# Patient Record
Sex: Female | Born: 1945 | Race: White | Hispanic: No | Marital: Married | Smoking: Never smoker
Health system: Southern US, Community
[De-identification: ages and names within clinical notes are randomized; demographics above are authoritative.]

## PROBLEM LIST (undated history)

## (undated) DIAGNOSIS — E785 Hyperlipidemia, unspecified: Secondary | ICD-10-CM

## (undated) DIAGNOSIS — M1990 Unspecified osteoarthritis, unspecified site: Secondary | ICD-10-CM

## (undated) DIAGNOSIS — K219 Gastro-esophageal reflux disease without esophagitis: Secondary | ICD-10-CM

## (undated) DIAGNOSIS — E7800 Pure hypercholesterolemia, unspecified: Secondary | ICD-10-CM

## (undated) DIAGNOSIS — I10 Essential (primary) hypertension: Secondary | ICD-10-CM

## (undated) DIAGNOSIS — I639 Cerebral infarction, unspecified: Secondary | ICD-10-CM

## (undated) HISTORY — DX: Hyperlipidemia, unspecified: E78.5

## (undated) HISTORY — DX: Essential (primary) hypertension: I10

---

## 1982-09-20 HISTORY — PX: TUBAL LIGATION: SHX77

## 2005-12-13 ENCOUNTER — Ambulatory Visit (HOSPITAL_COMMUNITY): Admission: RE | Admit: 2005-12-13 | Discharge: 2005-12-13 | Payer: Self-pay | Admitting: Family Medicine

## 2007-07-04 ENCOUNTER — Ambulatory Visit (HOSPITAL_COMMUNITY): Admission: RE | Admit: 2007-07-04 | Discharge: 2007-07-04 | Payer: Self-pay | Admitting: Family Medicine

## 2007-07-07 ENCOUNTER — Ambulatory Visit (HOSPITAL_COMMUNITY): Admission: RE | Admit: 2007-07-07 | Discharge: 2007-07-07 | Payer: Self-pay | Admitting: Family Medicine

## 2007-07-21 ENCOUNTER — Ambulatory Visit: Payer: Self-pay | Admitting: Vascular Surgery

## 2008-04-09 ENCOUNTER — Ambulatory Visit (HOSPITAL_COMMUNITY): Admission: RE | Admit: 2008-04-09 | Discharge: 2008-04-09 | Payer: Self-pay | Admitting: Family Medicine

## 2010-10-11 ENCOUNTER — Encounter: Payer: Self-pay | Admitting: Family Medicine

## 2010-10-21 HISTORY — PX: EYE SURGERY: SHX253

## 2010-11-11 ENCOUNTER — Other Ambulatory Visit: Payer: Self-pay | Admitting: Ophthalmology

## 2010-11-11 ENCOUNTER — Encounter (HOSPITAL_COMMUNITY): Payer: Medicare Other

## 2010-11-11 DIAGNOSIS — Z01810 Encounter for preprocedural cardiovascular examination: Secondary | ICD-10-CM | POA: Insufficient documentation

## 2010-11-11 DIAGNOSIS — Z01812 Encounter for preprocedural laboratory examination: Secondary | ICD-10-CM | POA: Insufficient documentation

## 2010-11-11 LAB — BASIC METABOLIC PANEL
CO2: 27 mEq/L (ref 19–32)
Chloride: 103 mEq/L (ref 96–112)
Creatinine, Ser: 0.73 mg/dL (ref 0.4–1.2)
GFR calc Af Amer: >60 mL/min (ref >60)
Glucose, Bld: 176 mg/dL — ABNORMAL HIGH (ref 70–99)

## 2010-11-17 ENCOUNTER — Ambulatory Visit (HOSPITAL_COMMUNITY)
Admission: RE | Admit: 2010-11-17 | Discharge: 2010-11-17 | Disposition: A | Discharge status: Home / Self Care | Payer: Medicare Other | Source: Ambulatory Visit | Attending: Ophthalmology | Admitting: Ophthalmology

## 2010-11-17 DIAGNOSIS — Z01812 Encounter for preprocedural laboratory examination: Secondary | ICD-10-CM | POA: Insufficient documentation

## 2010-11-17 DIAGNOSIS — H2510 Age-related nuclear cataract, unspecified eye: Secondary | ICD-10-CM | POA: Insufficient documentation

## 2010-11-17 DIAGNOSIS — Z7982 Long term (current) use of aspirin: Secondary | ICD-10-CM | POA: Insufficient documentation

## 2010-11-17 DIAGNOSIS — E119 Type 2 diabetes mellitus without complications: Secondary | ICD-10-CM | POA: Insufficient documentation

## 2011-02-02 NOTE — Consult Note (Signed)
VASCULAR SURGERY CONSULTATION   Nancy Sutton, Nancy Sutton  DOB:  12-13-1945                                       07/21/2007  AOZHY#:[id number]   Patient presents today for evaluation of bilateral lower extremity  claudication.  She is a very pleasant 65 year old white female who has  been quite healthy.  She was recently diagnosed with diabetes and also  reports what sounds like neuropathic pain and claudication in her lower  extremities.  She underwent outpatient noninvasive vascular laboratory  studies of her lower extremity arterial at [facility] on  October 17.  I am seeing her for discussion of this on further review in  consultation from Dr. Patrica Duel.   She has several components to her lower extremity discomfort.  She  reports that she has a tingling sensation and pain in the pretibial area  on the left.  She denies any of this on the right leg.  She also does  report straightforward bilateral calf claudication with extensive  walking.  She reports that this had been present for several months and  repots this has been more prominent on the left versus the right leg.  She denies any tissue loss.  She did have injury to her left great  toenail bed and has had a hematoma onto this for several years.   Her past history is significant for new onset of diabetes, non-insulin-  dependent.  She does have a history of elevated cholesterol.   FAMILY HISTORY:  Significant for premature atherosclerotic disease and  coronary disease in her father.   SOCIAL HISTORY:  She is married with two children.  She works as a  Charity fundraiser.  She does not smoke and has never consumed  alcohol.   REVIEW OF SYSTEMS:  She does report a 50 pound weight loss for unknown  cause and is down to 109 pounds.  CARDIAC:  No history of arrhythmias or  cardiac disease.  PULMONARY:  No history of pneumonia or other pulmonary  disease.  GI:  She does have acid reflux.  GU:   Negative.  NEURO:  Without history of stroke or seizures.  ORTHOPEDIC:  She does have  arthritis.  PSYCHIATRIC:  Negative for depression, urgency.  HEENT:  Some recent change in her eyesight.   DICTATION ENDS HERE.   Larina Earthly, M.D.  Electronically Signed  TFE/MEDQ  D:  07/21/2007  T:  07/24/2007  Job:  [id number]   cc:   Patrica Duel, M.D.

## 2011-02-02 NOTE — Consult Note (Signed)
VASCULAR SURGERY CONSULTATION   Nancy, BUDREAU  DOB:  Feb 10, 1946                                       07/21/2007  [id number]#:[id number]   This is a continuation of a previously dictated report.   ALLERGIES:  TETANUS, AMPICILLIN.   MEDICATIONS:  Prilosec, Glyburide, Metformin, simvastatin, and  Neurontin.   PHYSICAL EXAMINATION:  Patient is a well-developed and well-nourished  white female appearing her stated age of 62.  Blood pressure is 128/65,  pulse 89, respirations 16, O2 saturation is 100% on room air.  Her  carotid arteries are without bruits bilaterally.  She is grossly intact  neurologically.  Her radial pulses are 2+.  She has 2+ femoral pulses  and 1-2+ popliteal pulses bilaterally.  Did not feel right pedal pulse.  She has a 1+ posterior tibial pulse on the left and no dorsalis pedis  pulse on the left.  She has no tissue loss.  Her skin is normal with no  evidence of breakdown.   I reviewed her noninvasive vascular laboratory study with her with an  ankle-arm index of approximately 0.7 bilaterally.  I explained to her  that she does have mild-to-moderate arterial insufficiency causing the  mild calf claudication symptoms.  I explained that the tingling in her  left foot would not be related to this, since this is at rest, and she  actually has palpable pulses at rest on the left.  I explained that she  may have progression over time, but this is typically a stable  situation with slow progression.  She understands the risk factors and  control of these,  particularly her diabetes and elevated cholesterol control.  She will  see [place] again on an as-needed basis, should she have any progression.   Larina Earthly, M.D.  Electronically Signed  TFE/MEDQ  D:  07/21/2007  T:  07/24/2007  Job:  [id number]

## 2011-03-21 DIAGNOSIS — I639 Cerebral infarction, unspecified: Secondary | ICD-10-CM

## 2011-03-21 HISTORY — DX: Cerebral infarction, unspecified: I63.9

## 2011-04-30 ENCOUNTER — Inpatient Hospital Stay (HOSPITAL_COMMUNITY)
Admission: EM | Admit: 2011-04-30 | Discharge: 2011-05-01 | DRG: 066 | Disposition: A | Discharge status: Home / Self Care | Payer: Medicare Other | Attending: Family Medicine | Admitting: Family Medicine

## 2011-04-30 ENCOUNTER — Emergency Department (HOSPITAL_COMMUNITY): Payer: Medicare Other

## 2011-04-30 DIAGNOSIS — E785 Hyperlipidemia, unspecified: Secondary | ICD-10-CM | POA: Diagnosis present

## 2011-04-30 DIAGNOSIS — Z7982 Long term (current) use of aspirin: Secondary | ICD-10-CM

## 2011-04-30 DIAGNOSIS — H538 Other visual disturbances: Secondary | ICD-10-CM | POA: Diagnosis present

## 2011-04-30 DIAGNOSIS — I6350 Cerebral infarction due to unspecified occlusion or stenosis of unspecified cerebral artery: Principal | ICD-10-CM | POA: Diagnosis present

## 2011-04-30 DIAGNOSIS — H53469 Homonymous bilateral field defects, unspecified side: Secondary | ICD-10-CM | POA: Diagnosis present

## 2011-04-30 DIAGNOSIS — N289 Disorder of kidney and ureter, unspecified: Secondary | ICD-10-CM | POA: Diagnosis present

## 2011-04-30 DIAGNOSIS — D649 Anemia, unspecified: Secondary | ICD-10-CM | POA: Diagnosis present

## 2011-04-30 DIAGNOSIS — E119 Type 2 diabetes mellitus without complications: Secondary | ICD-10-CM | POA: Diagnosis present

## 2011-04-30 LAB — PROTIME-INR
INR: 0.95 (ref 0.00–1.49)
Prothrombin Time: 12.9 seconds (ref 11.6–15.2)

## 2011-04-30 LAB — COMPREHENSIVE METABOLIC PANEL
AST: 29 U/L (ref 0–37)
Albumin: 4.6 g/dL (ref 3.5–5.2)
Alkaline Phosphatase: 57 U/L (ref 39–117)
Chloride: 99 mEq/L (ref 96–112)
Potassium: 4.2 mEq/L (ref 3.5–5.1)
Total Bilirubin: 0.3 mg/dL (ref 0.3–1.2)
Total Protein: 8.1 g/dL (ref 6.0–8.3)

## 2011-04-30 LAB — URINALYSIS, ROUTINE W REFLEX MICROSCOPIC
Glucose, UA: NEGATIVE mg/dL
Ketones, ur: 15 mg/dL — AB
Nitrite: NEGATIVE
Protein, ur: NEGATIVE mg/dL
pH: 5 (ref 5.0–8.0)

## 2011-04-30 LAB — URINE MICROSCOPIC-ADD ON

## 2011-04-30 LAB — CBC
HCT: 35.8 % — ABNORMAL LOW (ref 36.0–46.0)
Hemoglobin: 12.4 g/dL (ref 12.0–15.0)
RBC: 4.31 MIL/uL (ref 3.87–5.11)
WBC: 8.7 10*3/uL (ref 4.0–10.5)

## 2011-04-30 LAB — TROPONIN I: Troponin I: <0.3 ng/mL (ref <0.30)

## 2011-04-30 LAB — CK TOTAL AND CKMB (NOT AT ARMC): CK, MB: 2.8 ng/mL (ref 0.3–4.0)

## 2011-04-30 LAB — APTT: aPTT: 32 seconds (ref 24–37)

## 2011-05-01 ENCOUNTER — Inpatient Hospital Stay (HOSPITAL_COMMUNITY): Payer: Medicare Other

## 2011-05-01 LAB — CBC
HCT: 32.9 % — ABNORMAL LOW (ref 36.0–46.0)
Hemoglobin: 11.1 g/dL — ABNORMAL LOW (ref 12.0–15.0)
MCH: 28.2 pg (ref 26.0–34.0)
MCV: 83.5 fL (ref 78.0–100.0)
Platelets: 229 10*3/uL (ref 150–400)
RBC: 3.94 MIL/uL (ref 3.87–5.11)
WBC: 6.2 10*3/uL (ref 4.0–10.5)

## 2011-05-01 LAB — BASIC METABOLIC PANEL
BUN: 27 mg/dL — ABNORMAL HIGH (ref 6–23)
CO2: 26 mEq/L (ref 19–32)
Calcium: 9.8 mg/dL (ref 8.4–10.5)
Chloride: 105 mEq/L (ref 96–112)
Creatinine, Ser: 0.7 mg/dL (ref 0.50–1.10)
Glucose, Bld: 139 mg/dL — ABNORMAL HIGH (ref 70–99)

## 2011-05-01 LAB — GLUCOSE, CAPILLARY: Glucose-Capillary: 156 mg/dL — ABNORMAL HIGH (ref 70–99)

## 2011-05-01 LAB — LIPID PANEL
LDL Cholesterol: 95 mg/dL (ref 0–99)
Total CHOL/HDL Ratio: 4.1 RATIO
VLDL: 61 mg/dL — ABNORMAL HIGH (ref 0–40)

## 2011-05-01 MED ORDER — GADOBENATE DIMEGLUMINE 529 MG/ML IV SOLN
12.0000 mL | Freq: Once | INTRAVENOUS | Status: AC
  Administered 2011-05-01: 12 mL via INTRAVENOUS

## 2011-05-01 NOTE — H&P (Signed)
Nancy Sutton, Nancy Sutton NO.:  [id number]  MEDICAL RECORD NO.:  [id number]  LOCATION:  [facility]                         FACILITY:  [facility]  PHYSICIAN:  Della Goo, M.D. DATE OF BIRTH:  July 14, 1946  DATE OF ADMISSION:  04/30/2011 DATE OF DISCHARGE:                             HISTORY & PHYSICAL   DATE OF ADMISSION:  April 30, 2011.  PRIMARY CARE PHYSICIAN:  Corrie Mckusick, MD  CHIEF COMPLAINT:  Decreased vision.  HISTORY OF PRESENT ILLNESS:  This is a 65 year old female who has had complaints of visual changes and dizziness, which started on April 20, 2011.  The patient states she woke up feeling dizzy and had visual changes.  She could not see objects clearly.  She states that day the symptoms began to get better, however, her vision did not get better. She could not focus.  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  The neurologist at [facility] on-call was consulted and the patient was advised to transfer to [facility] for admission and further evaluation. The patient arrived, decided to be driven by her family and arrived at the emergency department at [facility] and was seen in the emergency department.  The patient reported when she had her symptoms initially April 20, 2011; she did not have a headache or chest pain.  She stated that her visual problem, she cannot see the left side of each eye.  She also states that objects appear blurry.  The patient denies having any previous similar symptoms to this.  PAST MEDICAL HISTORY: 1. Significant for type 2 diabetes mellitus. 2. Hyperlipidemia. 3. Gastroesophageal reflux disease.  MEDICATIONS:  Include; glyburide, metformin, omeprazole, aspirin, Januvia, gabapentin,  simvastatin.  ALLERGIES:  AMPICILLIN and BROMDAY OPHTHALMIC DROPS and TETANUS VACCINE.  PAST SURGICAL HISTORY:  History of bilateral tubal ligation.  SOCIAL HISTORY:  The patient is married with children.  She is a nonsmoker, nondrinker.  No history of illicit drug usage.  FAMILY HISTORY:  Positive for a stroke in her father.  Positive for coronary artery disease.  No maternal grandmother.  Positive cancer in her mother who had pancreatic cancer with metastatic disease.  REVIEW OF SYSTEMS:  Pertinents are mentioned above.  PHYSICAL EXAMINATION:  GENERAL:  This is a 65 year old well-nourished, well-developed Caucasian female who is in no acute distress or discomfort at this time. VITAL SIGNS:  Temperature 98.4, blood pressure 130/61, heart rate 101, respirations 19, O2 sat is 100%. HEENT:  Normocephalic, atraumatic.  Pupils equally, round and reactive to light.  Extraocular movements are intact except the patient is having difficulty with medial rotation of both eyes.  With this maneuver, the patient is gazing to her left.  Funduscopic benign.  Nares are patent bilaterally.  Oropharynx is clear.  There is no tongue deviation. NECK:  Supple.  Full range of motion.  No thyromegaly, adenopathy, jugular venous distention. CARDIOVASCULAR:  Regular rate and rhythm.  No murmurs, gallops or rubs appreciated. LUNGS:  Clear to auscultation bilaterally.  No rales, rhonchi or wheezes. ABDOMEN:  Positive bowel sounds, soft, nontender, nondistended.  No hepatosplenomegaly.  No rebound or guarding. EXTREMITIES:  Without cyanosis, clubbing or edema. NEUROLOGIC:  Cranial nerves are intact except the patient has a left homonymous hemianopsia.  She also has a deficit with ocular convergents. There is no facial drooping.  There is no tongue deviation.  The patient has no pronator drift.  There is no hemiplegia or hemiparesis and her grip strength is 5/5 bilaterally.  Cerebellar function is also  intact; however, gait has not been assessed and Romberg has not been performed.  ASSESSMENT:  A 65 year old female being admitted with; 1. Recent right-sided PCA infarction. 2. Type 2 diabetes mellitus. 3. Dyslipidemia/hyperlipidemia. 4. Visual deficit secondary to recent right-sided PCA infarction.  PLAN:  The patient has been admitted for further evaluation of her risk factors for risk ratification and treatment.  The patient has been seen by neurologist, Dr. Pearlean Brownie and has been started on Plavix therapy.  The patient will have a CVA workup and she already had an MRI study as mentioned above in the HPI performed at the [facility] [place].  These results will be referred to Radiology at [facility] as well.  A fasting lipid panel will be checked.  A hemoglobin A1c will also be checked and cardiac enzymes and further imaging studies such as a carotid ultrasound and TEE study will be ordered.  Per Dr. Pearlean Brownie the consultant, if the patient has an uneventful stay during the first 23 hours, consideration will be made for an outpatient TEE study.  Further workup will ensue pending results of the patient's clinical course and her studies and the patient is a full code.     Della Goo, M.D.     HJ/MEDQ  D:  04/30/2011  T:  04/30/2011  Job:  [id number]  cc:   Corrie Mckusick, M.D.  Electronically Signed by Della Goo M.D. on 05/01/2011 09:25:43 PM

## 2011-05-03 LAB — GLUCOSE, CAPILLARY: Glucose-Capillary: 161 mg/dL — ABNORMAL HIGH (ref 70–99)

## 2011-05-03 MED ORDER — GADOBENATE DIMEGLUMINE 529 MG/ML IV SOLN
12.0000 mL | Freq: Once | INTRAVENOUS | Status: AC
  Administered 2011-05-01: 12 mL via INTRAVENOUS

## 2011-05-05 NOTE — Discharge Summary (Signed)
NAMEBRAYLON, LEMMONS NO.:  [id number]  MEDICAL RECORD NO.:  [id number]  LOCATION:  3018                         FACILITY:  [facility]  PHYSICIAN:  Pleas Koch, MD        DATE OF BIRTH:  [birth year]  DATE OF ADMISSION:  04/30/2011 DATE OF DISCHARGE:  05/01/2011                              DISCHARGE SUMMARY   DISCHARGE DIAGNOSES: 1. 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 2. Hyperlipidemia with LDL of 95, triglycerides 304, and total     cholesterol 206. 3. Diabetes mellitus, A1c 0.8. 4. Mild renal insufficiency, which was resolved. 5. Anemia.  DISCHARGE MEDICATIONS: 1. Metanx one tablet b.i.d. 2. Gabapentin 300 mg one tablet p.o. daily. 3. Januvia 100 mg daily. 4. Simvastatin, please note dosage changed from 20 to 40 mg every     evening. 5. Please note aspirin has been discontinued and substituted Plavix. 6. Omeprazole 20 mg one tablet daily. 7. Glyburide/metformin 2.5/500 two tablets b.i.d.  PERTINENT IMAGING STUDIES: 1. CT head showing low attenuation changes consistent with acute     infarct in the distribution of right posterior cerebral artery.  No     evidence of acute intracranial hemorrhage or mass lesion. 2. MRA brain showing;     a.     Right PCA occluded 10 mm beyond origin without reconstituted      flow.  Moderate-to-severe left PCA P2 segment stenosis, appears      elevated atherosclerosis.     b.     Distal vertebral arteries and distal arteries are within      normal limits.     c.     Severe cavernous and supraclinoid ICA atherosclerosis      bilaterally with high-grade right ICA anterior genu stenosis.     d.     Moderate bilateral MCA atherosclerosis with significant      stenosis of major branch occlusion. 3. MRA head showed:     a.     Nondominant right vertebral artery MRA without MRA evidence      of dissection.  Dominant left vertebral artery, mild stenosis at      origin, but no MRI  evidence of dissection.     b.     Left common carotid and right carotid bifurcation      atherosclerosis without hemodynamically significant common carotid      or cervical ICA stenosis.  Please see full H and Demetrius Charity, #[id number] dated April 30, 2011 by Dr. Della Goo, please carbon copy that number to Dr. Corrie Mckusick in [place].  Briefly, this is a 65-year female who came with visual changes, dizziness, and weakness, which started on July 31.  She went to the emergency room at [facility] in [place].  Neurologist was consulted and she was transferred, however, she was found to have a right PCA infarction and was stabilized.  She was outside of the window of therapy for TPA or thrombolytics.  Neurologist, Dr. Pearlean Brownie saw her and recommended TEE and 3- week cardiac monitor as outpatient and strict diabetes mellitus control. He also recommend increasing statin and follow-up for bubble  study.  The patient was stabilized during the course of hospitalization and was doing well.  She wanted to go home and pursue the studies as an outpatient and was clear by Neurology to do the same.  She will continue on her regular diabetes medications.  We have counseled her on returning to the emergency room if any further issues and she was stabilized enough to go home.  Vitals on day of discharge were as follows; temperature 98.0, blood pressure 122-150 over 61-68, pulse 85, respirations 20, 99% on room air.  She still had persisting left-sided hemianopsia and could not visualize numbers.  PT/OT was consult and they recommended getting an outpatient prescription for OT if she was having persisting difficulty with ADLs, IADLs, and the patient was subsequently discharged home.  The patient will need to likely follow up with neuro ophthalmologist in the future.          ______________________________ Pleas Koch, MD     JS/MEDQ  D:  05/01/2011  T:  05/01/2011  Job:  [id number]  Electronically Signed  by Pleas Koch MD on 05/05/2011 07:51:16 AM

## 2011-05-17 ENCOUNTER — Telehealth: Payer: Self-pay | Admitting: *Deleted

## 2011-05-17 NOTE — Telephone Encounter (Signed)
I was given notes  from [facility] --Dr Pearlean Brownie --to schedule TEE and 3 week event monitor. In [facility] pt is already scheduled for echo (paperwork from Dr Pearlean Brownie indicates it should be TEE--paperwork on message cart)  and event monitor 05/19/11. I attempted to contact pt at [phone] and [phone].

## 2011-05-17 NOTE — Telephone Encounter (Signed)
I talked with pt. Pt states that she was told today by Andrey Campanile at Dr Marlis Edelson office  that she would have the TTE done first and a decision would be made after the TTE if she needed a TEE. I have left a message for Alverda Skeans at [facility] [phone] to verify TTE and not TEE. I have talked with pt and she is aware that I am waiting to hear from [place]. Pt alternate # [phone].

## 2011-05-18 ENCOUNTER — Telehealth (HOSPITAL_COMMUNITY): Payer: Self-pay | Admitting: Radiology

## 2011-05-18 NOTE — Telephone Encounter (Signed)
Spoke with [place].  She verified with Dr. Pearlean Brownie, the order for a TEE.  He does not want the patient to have TTE.  She also, notified patient that when the patient arrives tomorrow, she is to get a monitor only.  I told her I would notify Margorie John, so the patient can be scheduled for the TEE.

## 2011-05-18 NOTE — Telephone Encounter (Signed)
LMOVM returning call & that Thurston Hole will be in the office tomorrow. Mylo Red RN

## 2011-05-18 NOTE — Telephone Encounter (Signed)
Returning Thurston Hole called from yesterday to discuss TTE OR TEE.

## 2011-05-19 ENCOUNTER — Other Ambulatory Visit: Payer: Self-pay | Admitting: *Deleted

## 2011-05-19 ENCOUNTER — Other Ambulatory Visit (HOSPITAL_COMMUNITY): Payer: Medicare Other | Admitting: Radiology

## 2011-05-19 ENCOUNTER — Encounter (INDEPENDENT_AMBULATORY_CARE_PROVIDER_SITE_OTHER): Payer: Medicare Other

## 2011-05-19 ENCOUNTER — Encounter: Payer: Self-pay | Admitting: *Deleted

## 2011-05-19 DIAGNOSIS — I4891 Unspecified atrial fibrillation: Secondary | ICD-10-CM

## 2011-05-19 DIAGNOSIS — I639 Cerebral infarction, unspecified: Secondary | ICD-10-CM

## 2011-05-19 NOTE — Consult Note (Signed)
Nancy Sutton, Nancy Sutton                ACCOUNT NO.:  [id number]  MEDICAL RECORD NO.:  [id number]  LOCATION:  3018                         FACILITY:  [facility]  PHYSICIAN:  Pramod P. Pearlean Brownie, MD    DATE OF BIRTH:  [birth year]  DATE OF CONSULTATION: DATE OF DISCHARGE:                                CONSULTATION   REFERRING PHYSICIAN:  Della Goo, MD  REASON FOR REFERRAL:  Stroke.  HISTORY OF PRESENT ILLNESS:  Nancy Sutton is a 65 year old Caucasian lady who developed sudden onset of dizziness, nausea, and blurred vision 10 days ago after awakening from sleep.  She was seen in the primary physician's office by a physician's assistant, thought she had inner ear disease and was treated symptomatically though symptoms improved, but 2- 3 days later, she noticed worsening of her vision symptoms.  She was seen by ophthalmologist in [place] and eventually had a visual field barometric test done today which suggested left homonymous hemianopsia. She was referred for outpatient MRI scan which was done earlier today at [facility], and I have reviewed the films personally, showed a large right posterior cerebral artery infarct which was nonhemorrhagic. The patient has left-sided vision symptoms, but nausea and dizziness and gait imbalance have all resolved.  She has no known prior history of stroke, TIA, seizure, or significant neurological problems.  PAST MEDICAL HISTORY:  Significant for: 1. Diabetes. 2. Hyperlipidemia. 3. Gastroesophageal reflux disease.  HOME MEDICATIONS:  Aspirin, glyburide, metformin, omeprazole, gabapentin, simvastatin, Januvia.  MEDICATION ALLERGIES:  None listed.  SOCIAL HISTORY:  She is married, lives with her husband, does not smoke or drink, and she is independent activities of [facility] living.  FAMILY HISTORY:  Noncontributory.  SURGICAL HISTORY:  None.  REVIEW OF SYSTEMS:  As documented above in history of present illness.  PHYSICAL EXAMINATION:  GENERAL:   A pleasant, middle-aged, Caucasian lady who is currently not in distress. VITAL SIGNS:  She is currently afebrile, temperature 98.4, blood pressure 130/62, pulse rate 100 per minute and regular sinus, respiratory rate 19 per minute.  Distal pulses well felt.  Oxygen sats 100% on room air. HEENT:  Head is nontraumatic.  Hearing is normal. NECK:  Supple without bruit. CARDIAC:  No murmur or gallop. LUNGS:  Clear to auscultation. ABDOMEN:  Soft, nontender. NEUROLOGICAL:  She is awake, alert, pleasant, cooperative.  She has intact attention, registration, and recall.  She is oriented x3.  Speech is normal.  Eye movements are full range without nystagmus.  Face is symmetric.  She has dense left homonymous hemianopsia.  Tongue is midline.  Motor system exam, no upper or lower extremity drift, symmetric, and equal strength in all four extremities.  Deep tendon reflexes are 2+ and symmetric.  Plantars are downgoing.  Touch pinprick sensation are preserved.  Gait was not tested.  DATA REVIEWED:  MRI scan of the brain done today at [facility], reviewed personally, shows large right posterior cerebral artery infarct.  No other labs are available at present time.  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  She does have vascular risk factors of diabetes and hyperlipidemia.  PLAN:  The patient  will be admitted for further stroke evaluation. Symptoms have clearly lasted for several days, and hence she is not a candidate for aggressive intervention.  Check MRA of the brain and neck to rule out significant intra or extracranial posterior circulation occlusive disease.  Change aspirin to Plavix for secondary stroke prevention.  Check 2-D echocardiogram, fasting lipid profile, hemoglobin A1c, and carotid transcranial Doppler studies.  Telemetry monitoring.  I had a long discussion with the patient's husband and daughter  regarding plan for evaluation and treatment and answered questions.  Kindly call for questions.     Pramod P. Pearlean Brownie, MD     PPS/MEDQ  D:  04/30/2011  T:  05/01/2011  Job:  [id number]  Electronically Signed by Delia Heady MD on 05/19/2011 08:33:03 PM

## 2011-05-19 NOTE — Telephone Encounter (Signed)
I talked with pt. Pt TTE cancelled and I have scheduled TEE for 05/20/11 1pm Dr Tenny Craw.

## 2011-05-20 ENCOUNTER — Ambulatory Visit (HOSPITAL_COMMUNITY)
Admission: RE | Admit: 2011-05-20 | Discharge: 2011-05-20 | Disposition: A | Discharge status: Home / Self Care | Payer: Medicare Other | Source: Ambulatory Visit | Attending: Internal Medicine | Admitting: Internal Medicine

## 2011-05-20 DIAGNOSIS — I079 Rheumatic tricuspid valve disease, unspecified: Secondary | ICD-10-CM | POA: Insufficient documentation

## 2011-05-20 DIAGNOSIS — Z8673 Personal history of transient ischemic attack (TIA), and cerebral infarction without residual deficits: Secondary | ICD-10-CM | POA: Insufficient documentation

## 2011-05-20 DIAGNOSIS — I6789 Other cerebrovascular disease: Secondary | ICD-10-CM

## 2011-05-20 DIAGNOSIS — I059 Rheumatic mitral valve disease, unspecified: Secondary | ICD-10-CM | POA: Insufficient documentation

## 2011-09-24 DIAGNOSIS — I10 Essential (primary) hypertension: Secondary | ICD-10-CM | POA: Diagnosis not present

## 2011-09-24 DIAGNOSIS — E119 Type 2 diabetes mellitus without complications: Secondary | ICD-10-CM | POA: Diagnosis not present

## 2011-09-28 DIAGNOSIS — M1710 Unilateral primary osteoarthritis, unspecified knee: Secondary | ICD-10-CM | POA: Diagnosis not present

## 2011-09-28 DIAGNOSIS — IMO0002 Reserved for concepts with insufficient information to code with codable children: Secondary | ICD-10-CM | POA: Diagnosis not present

## 2011-10-01 DIAGNOSIS — E785 Hyperlipidemia, unspecified: Secondary | ICD-10-CM | POA: Diagnosis not present

## 2011-10-01 DIAGNOSIS — IMO0001 Reserved for inherently not codable concepts without codable children: Secondary | ICD-10-CM | POA: Diagnosis not present

## 2011-10-11 DIAGNOSIS — Z961 Presence of intraocular lens: Secondary | ICD-10-CM | POA: Diagnosis not present

## 2011-10-11 DIAGNOSIS — H47649 Disorders of visual cortex in (due to) vascular disorders, unspecified side of brain: Secondary | ICD-10-CM | POA: Diagnosis not present

## 2011-10-11 DIAGNOSIS — H2510 Age-related nuclear cataract, unspecified eye: Secondary | ICD-10-CM | POA: Diagnosis not present

## 2011-10-18 ENCOUNTER — Encounter (HOSPITAL_COMMUNITY): Payer: Self-pay | Admitting: Pharmacy Technician

## 2011-10-26 ENCOUNTER — Encounter (HOSPITAL_COMMUNITY)
Admission: RE | Admit: 2011-10-26 | Discharge: 2011-10-26 | Disposition: A | Discharge status: Home / Self Care | Payer: Medicare Other | Source: Ambulatory Visit | Attending: Ophthalmology | Admitting: Ophthalmology

## 2011-10-26 ENCOUNTER — Encounter (HOSPITAL_COMMUNITY): Payer: Self-pay

## 2011-10-26 DIAGNOSIS — H2510 Age-related nuclear cataract, unspecified eye: Secondary | ICD-10-CM | POA: Diagnosis not present

## 2011-10-26 DIAGNOSIS — I10 Essential (primary) hypertension: Secondary | ICD-10-CM | POA: Diagnosis not present

## 2011-10-26 DIAGNOSIS — Z01812 Encounter for preprocedural laboratory examination: Secondary | ICD-10-CM | POA: Diagnosis not present

## 2011-10-26 DIAGNOSIS — Z794 Long term (current) use of insulin: Secondary | ICD-10-CM | POA: Diagnosis not present

## 2011-10-26 DIAGNOSIS — Z79899 Other long term (current) drug therapy: Secondary | ICD-10-CM | POA: Diagnosis not present

## 2011-10-26 DIAGNOSIS — E119 Type 2 diabetes mellitus without complications: Secondary | ICD-10-CM | POA: Diagnosis not present

## 2011-10-26 HISTORY — DX: Unspecified osteoarthritis, unspecified site: M19.90

## 2011-10-26 HISTORY — DX: Cerebral infarction, unspecified: I63.9

## 2011-10-26 HISTORY — DX: Gastro-esophageal reflux disease without esophagitis: K21.9

## 2011-10-26 HISTORY — DX: Pure hypercholesterolemia, unspecified: E78.00

## 2011-10-26 LAB — BASIC METABOLIC PANEL
BUN: 19 mg/dL (ref 6–23)
CO2: 26 mEq/L (ref 19–32)
Calcium: 10.3 mg/dL (ref 8.4–10.5)
GFR calc non Af Amer: >90 mL/min (ref >90)
Glucose, Bld: 146 mg/dL — ABNORMAL HIGH (ref 70–99)

## 2011-10-26 NOTE — Patient Instructions (Addendum)
20 AMEY HOSSAIN  10/26/2011   Your procedure is scheduled on:  11/01/2011  Report to [facility] at  615  AM.  Call this number if you have problems the morning of surgery: [phone]   Remember:   Do not eat food:After Midnight.  May have clear liquids:until Midnight .  Clear liquids include soda, tea, black coffee, apple or grape juice, broth.  Take these medicines the morning of surgery with A SIP OF WATER: losartan,ranitidine. Take 1/2 levemir-7 units night before your surgery.   Do not wear jewelry, make-up or nail polish.  Do not wear lotions, powders, or perfumes. You may wear deodorant.  Do not shave 48 hours prior to surgery.  Do not bring valuables to the hospital.  Contacts, dentures or bridgework may not be worn into surgery.  Leave suitcase in the car. After surgery it may be brought to your room.  For patients admitted to the hospital, checkout time is 11:00 AM the day of discharge.   Patients discharged the day of surgery will not be allowed to drive home.  Name and phone number of your driver: family  Special Instructions: N/A   Please read over the following fact sheets that you were given: Pain Booklet, Surgical Site Infection Prevention, Anesthesia Post-op Instructions and Care and Recovery After Surgery Cataract A cataract is a clouding of the lens of the eye. It is most often related to aging. A cataract is not a "film" over the surface of the eye. The lens is inside the eye and changes size of the pupil. The lens can enlarge to let more light enter the eye in dark environments and contract the size of the pupil to let in bright light. The lens is the part of the eye that helps focus light on the retina. The retina is the eye's light-sensitive layer. It is in the back of the eye that sends visual signals to the brain. In a normal eye, light passes through the lens and gets focused on the retina. To help produce a sharp image, the lens must remain clear. When a lens  becomes cloudy, vision is compromised by the degree and nature of the clouding. Certain cataracts make people more near-sighted as they develop, others increase glare, and all reduce vision to some degree or another. A cataract that is so dense that it becomes milky white and a white opacity can be seen through the pupil. When the white color is seen, it is called a "mature" or "hyper-mature cataract." Such cataracts cause total blindness in the affected eye. The cataract must be removed to prevent damage to the eye itself. Some types of cataracts can cause a secondary disease of the eye, such as certain types of glaucoma. In the early stages, better lighting and eyeglasses may lessen vision problems caused by cataracts. At a certain point, surgery may be needed to improve vision. CAUSES   Aging. However, cataracts may occur at any age, even in newborns.   Certain drugs.   Trauma to the eye.   Certain diseases (such as diabetes).   Inherited or acquired medical syndromes.  SYMPTOMS   Gradual, progressive drop in vision in the affected eye. Cataracts may develop at different rates in each eye. Cataracts may even be in just one eye with the other unaffected.   Cataracts due to trauma may develop quickly, sometimes over a matter or days or even hours. The result is severe and rapid visual loss.  DIAGNOSIS  To detect  a cataract, an eye doctor examines the lens. A well developed cataract can be diagnosed without dilating the pupil. Early cataracts and others of a specific nature are best diagnosed with an exam of the eyes with the pupils dilated by drops. TREATMENT   For an early cataract, vision may improve by using different eyeglasses or stronger lighting.   If the above measures do not help, surgery is the only effective treatment. This treatment removes the cloudy lens and replaces it with a substitute lens (Intraocular lens, or IOL). Newly developed IOL technology allows the implanted lens  to improve vision both at a distance and up close. Discuss with your eye surgeon about the possibility of still needing glasses. Also discuss how visual coordination between both eyes will be affected.  A cataract needs to be removed only when vision loss interferes with your everyday activities such as driving, reading or watching TV. You and your eye doctor can make that decision together. In most cases, waiting until you are ready to have cataract surgery will not harm your eye. If you have cataracts in both eyes, only one should be removed at a time. This allows the operated eye to heal and be out of danger from serious problems (such as infection or poor wound healing) before having the other eye undergo surgery.  Sometimes, a cataract should be removed even if it does not cause problems with your vision. For example, a cataract should be removed if it prevents examination or treatment of another eye problem. Just as you cannot see out of the affected eye well, your doctor cannot see into your eye well through a cataract. The vast majority of people who have cataract surgery have better vision afterward. CATARACT REMOVAL There are two primary ways to remove a cataract. Your doctor can explain the differences and help determine which is best for you:  Phacoemulsification (small incision cataract surgery). This involves making a small cut (incision) on the edge of the clear, dome-shaped surface that covers the front of the eye (the cornea). An injection behind the eye or eye drops are given to make this a painless procedure. The doctor then inserts a tiny probe into the eye. This device emits ultrasound waves that soften and break up the cloudy center of the lens so it can be removed by suction. Most cataract surgery is done this way. The cuts are usually so small and performed in such a manner that often no sutures are needed to keep it closed.   Extracapsular surgery. Your doctor makes a slightly  longer incision on the side of the cornea. The doctor removes the hard center of the lens. The remainder of the lens is then removed by suction. In some cases, extremely fine sutures are needed which the doctor may, or may not remove in the office after the surgery.  When an IOL is implanted, it needs no care. It becomes a permanent part of your eye and cannot be seen or felt.  Some people cannot have an IOL. They may have problems during surgery, or maybe they have another eye disease. For these people, a soft contact lens may be suggested. If an IOL or contact lens cannot be used, very powerful and thick glasses are required after surgery. Since vision is very different through such thick glasses, it is important to have your doctor discuss the impact on your vision after any cataract surgery where there is no plan to implant an IOL. The normal lens of the  eye is covered by a clear capsule. Both phacoemulsification and extracapsular surgery require that the back surface of this lens capsule be left in place. This helps support IOLs and prevents the IOL from dislocating and falling back into the deeper interior of the eye. Right after surgery, and often permanently this "posterior capsule" remains clear. In some cases however, it can become cloudy, presenting the same type of visual compromise that the original cataract did since light is again obstructed as it passes through the clear IOL. This condition is often referred to as an "after-cataract." Fortunately, after-cataracts are easily treated using a painless and very fast laser treatment that is performed without anesthesia or incisions. It is done in a matter of minutes in an outpatient environment. Visual improvement is often immediate.  HOME CARE INSTRUCTIONS   Your surgeon will discuss pre and post operative care with you prior to surgery. The majority of people are able to do almost all normal activities right away. Although, it is often advised to  avoid strenuous activity for a period of time.   Postoperative drops and careful avoidance of infection will be needed. Many surgeons suggest the use of a protective shield during the first few days after surgery.   There is a very small incidence of complication from modern cataract surgery, but it can happen. Infection that spreads to the inside of the eye (endophthalmitis) can result in total visual loss and even loss of the eye itself. In extremely rare instances, the inflammation of endophthalmitis can spread to both eyes (sympathetic ophthalmia). Appropriate post-operative care under the close observation of your surgeon is essential to a successful outcome.  SEEK IMMEDIATE MEDICAL CARE IF:   You have any sudden drop of vision in the operated eye.   You have pain in the operated eye.   You see a large number of floating dots in the field of vision in the operated eye.   You see flashing lights, or if a portion of your side vision in any direction appears black (like a curtain being drawn into your field of vision) in the operated eye.  Document Released: 09/06/2005 Document Revised: 05/19/2011 Document Reviewed: 10/23/2007 [facility] Patient Information 2012 [place], [place].PATIENT INSTRUCTIONS POST-ANESTHESIA  IMMEDIATELY FOLLOWING SURGERY:  Do not drive or operate machinery for the first twenty four hours after surgery.  Do not make any important decisions for twenty four hours after surgery or while taking narcotic pain medications or sedatives.  If you develop intractable nausea and vomiting or a severe headache please notify your doctor immediately.  FOLLOW-UP:  Please make an appointment with your surgeon as instructed. You do not need to follow up with anesthesia unless specifically instructed to do so.  WOUND CARE INSTRUCTIONS (if applicable):  Keep a dry clean dressing on the anesthesia/puncture wound site if there is drainage.  Once the wound has quit draining you may leave it  open to air.  Generally you should leave the bandage intact for twenty four hours unless there is drainage.  If the epidural site drains for more than 36-48 hours please call the anesthesia department.  QUESTIONS?:  Please feel free to call your physician or the hospital operator if you have any questions, and they will be happy to assist you.     [facility] Anesthesia Department [address] [place] [phone]

## 2011-10-29 MED ORDER — CYCLOPENTOLATE-PHENYLEPHRINE 0.2-1 % OP SOLN
OPHTHALMIC | Status: AC
  Filled 2011-10-29: qty 2

## 2011-10-29 MED ORDER — NEOMYCIN-POLYMYXIN-DEXAMETH 3.5-10000-0.1 OP OINT
TOPICAL_OINTMENT | OPHTHALMIC | Status: AC
  Filled 2011-10-29: qty 3.5

## 2011-10-29 MED ORDER — TETRACAINE HCL 0.5 % OP SOLN
OPHTHALMIC | Status: AC
  Filled 2011-10-29: qty 2

## 2011-10-29 MED ORDER — LIDOCAINE HCL (PF) 1 % IJ SOLN
INTRAMUSCULAR | Status: AC
  Filled 2011-10-29: qty 2

## 2011-10-29 MED ORDER — LIDOCAINE HCL 3.5 % OP GEL
OPHTHALMIC | Status: AC
  Filled 2011-10-29: qty 5

## 2011-11-01 ENCOUNTER — Encounter (HOSPITAL_COMMUNITY)
Admission: RE | Disposition: A | Discharge status: Home / Self Care | Payer: Self-pay | Source: Ambulatory Visit | Attending: Ophthalmology

## 2011-11-01 ENCOUNTER — Encounter (HOSPITAL_COMMUNITY): Payer: Self-pay | Admitting: *Deleted

## 2011-11-01 ENCOUNTER — Ambulatory Visit (HOSPITAL_COMMUNITY): Payer: Medicare Other | Admitting: Anesthesiology

## 2011-11-01 ENCOUNTER — Ambulatory Visit (HOSPITAL_COMMUNITY)
Admission: RE | Admit: 2011-11-01 | Discharge: 2011-11-01 | Disposition: A | Discharge status: Home / Self Care | Payer: Medicare Other | Source: Ambulatory Visit | Attending: Ophthalmology | Admitting: Ophthalmology

## 2011-11-01 ENCOUNTER — Encounter (HOSPITAL_COMMUNITY): Payer: Self-pay | Admitting: Anesthesiology

## 2011-11-01 DIAGNOSIS — I10 Essential (primary) hypertension: Secondary | ICD-10-CM | POA: Diagnosis not present

## 2011-11-01 DIAGNOSIS — H2510 Age-related nuclear cataract, unspecified eye: Secondary | ICD-10-CM | POA: Insufficient documentation

## 2011-11-01 DIAGNOSIS — Z01812 Encounter for preprocedural laboratory examination: Secondary | ICD-10-CM | POA: Diagnosis not present

## 2011-11-01 DIAGNOSIS — H269 Unspecified cataract: Secondary | ICD-10-CM | POA: Diagnosis not present

## 2011-11-01 DIAGNOSIS — Z794 Long term (current) use of insulin: Secondary | ICD-10-CM | POA: Insufficient documentation

## 2011-11-01 DIAGNOSIS — E119 Type 2 diabetes mellitus without complications: Secondary | ICD-10-CM | POA: Diagnosis not present

## 2011-11-01 DIAGNOSIS — Z79899 Other long term (current) drug therapy: Secondary | ICD-10-CM | POA: Insufficient documentation

## 2011-11-01 HISTORY — PX: CATARACT EXTRACTION W/PHACO: SHX586

## 2011-11-01 SURGERY — PHACOEMULSIFICATION, CATARACT, WITH IOL INSERTION
Anesthesia: Monitor Anesthesia Care | Site: Eye | Laterality: Right | Wound class: Clean

## 2011-11-01 MED ORDER — CYCLOPENTOLATE-PHENYLEPHRINE 0.2-1 % OP SOLN
1.0000 [drp] | OPHTHALMIC | Status: AC
  Administered 2011-11-01 (×3): 1 [drp] via OPHTHALMIC

## 2011-11-01 MED ORDER — LIDOCAINE HCL (PF) 1 % IJ SOLN
INTRAMUSCULAR | Status: DC | PRN
  Administered 2011-11-01: 4 mL

## 2011-11-01 MED ORDER — MIDAZOLAM HCL 2 MG/2ML IJ SOLN
1.0000 mg | INTRAMUSCULAR | Status: DC | PRN
  Administered 2011-11-01: 2 mg via INTRAVENOUS

## 2011-11-01 MED ORDER — EPINEPHRINE HCL 1 MG/ML IJ SOLN
INTRAMUSCULAR | Status: AC
  Filled 2011-11-01: qty 1

## 2011-11-01 MED ORDER — BSS IO SOLN
INTRAOCULAR | Status: DC | PRN
  Administered 2011-11-01: 15 mL via INTRAOCULAR

## 2011-11-01 MED ORDER — PHENYLEPHRINE HCL 2.5 % OP SOLN
OPHTHALMIC | Status: AC
  Administered 2011-11-01: 1 [drp] via OPHTHALMIC
  Filled 2011-11-01: qty 2

## 2011-11-01 MED ORDER — EPINEPHRINE HCL 1 MG/ML IJ SOLN
INTRAOCULAR | Status: DC | PRN
  Administered 2011-11-01: 07:00:00

## 2011-11-01 MED ORDER — ONDANSETRON HCL 4 MG/2ML IJ SOLN: 4.0000 mg | Freq: Once | INTRAMUSCULAR | Status: DC | PRN

## 2011-11-01 MED ORDER — LACTATED RINGERS IV SOLN
INTRAVENOUS | Status: DC
  Administered 2011-11-01: 1000 mL via INTRAVENOUS

## 2011-11-01 MED ORDER — FENTANYL CITRATE 0.05 MG/ML IJ SOLN: 25.0000 ug | INTRAMUSCULAR | Status: DC | PRN

## 2011-11-01 MED ORDER — PROVISC 10 MG/ML IO SOLN
INTRAOCULAR | Status: DC | PRN
  Administered 2011-11-01: 8.5 mg via INTRAOCULAR

## 2011-11-01 MED ORDER — TETRACAINE HCL 0.5 % OP SOLN
1.0000 [drp] | OPHTHALMIC | Status: AC
  Administered 2011-11-01 (×3): 1 [drp] via OPHTHALMIC

## 2011-11-01 MED ORDER — NEOMYCIN-POLYMYXIN-DEXAMETH 0.1 % OP OINT
TOPICAL_OINTMENT | OPHTHALMIC | Status: DC | PRN
  Administered 2011-11-01: 1 via OPHTHALMIC

## 2011-11-01 MED ORDER — MIDAZOLAM HCL 2 MG/2ML IJ SOLN
INTRAMUSCULAR | Status: AC
  Administered 2011-11-01: 2 mg via INTRAVENOUS
  Filled 2011-11-01: qty 2

## 2011-11-01 MED ORDER — LIDOCAINE 3.5 % OP GEL OPTIME - NO CHARGE
OPHTHALMIC | Status: DC | PRN
  Administered 2011-11-01: 2 [drp] via OPHTHALMIC

## 2011-11-01 MED ORDER — POVIDONE-IODINE 5 % OP SOLN
OPHTHALMIC | Status: DC | PRN
  Administered 2011-11-01: 1 via OPHTHALMIC

## 2011-11-01 MED ORDER — LIDOCAINE HCL 3.5 % OP GEL
1.0000 "application " | Freq: Once | OPHTHALMIC | Status: AC
  Administered 2011-11-01: 1 via OPHTHALMIC

## 2011-11-01 MED ORDER — PHENYLEPHRINE HCL 2.5 % OP SOLN
1.0000 [drp] | OPHTHALMIC | Status: AC
  Administered 2011-11-01 (×3): 1 [drp] via OPHTHALMIC

## 2011-11-01 SURGICAL SUPPLY — 30 items
CAPSULAR TENSION RING-AMO (OPHTHALMIC RELATED) IMPLANT
CLOTH BEACON ORANGE TIMEOUT ST (SAFETY) ×1 IMPLANT
EYE SHIELD UNIVERSAL CLEAR (GAUZE/BANDAGES/DRESSINGS) ×1 IMPLANT
GLOVE BIO SURGEON STRL SZ 6.5 (GLOVE) IMPLANT
GLOVE BIOGEL PI IND STRL 6.5 (GLOVE) IMPLANT
GLOVE BIOGEL PI IND STRL 7.0 (GLOVE) IMPLANT
GLOVE BIOGEL PI IND STRL 7.5 (GLOVE) IMPLANT
GLOVE BIOGEL PI INDICATOR 6.5 (GLOVE) ×1
GLOVE BIOGEL PI INDICATOR 7.0 (GLOVE)
GLOVE BIOGEL PI INDICATOR 7.5 (GLOVE)
GLOVE ECLIPSE 6.5 STRL STRAW (GLOVE) ×1 IMPLANT
GLOVE ECLIPSE 7.0 STRL STRAW (GLOVE) IMPLANT
GLOVE ECLIPSE 7.5 STRL STRAW (GLOVE) IMPLANT
GLOVE EXAM NITRILE LRG STRL (GLOVE) IMPLANT
GLOVE EXAM NITRILE MD LF STRL (GLOVE) ×1 IMPLANT
GLOVE SKINSENSE NS SZ6.5 (GLOVE)
GLOVE SKINSENSE NS SZ7.0 (GLOVE)
GLOVE SKINSENSE STRL SZ6.5 (GLOVE) IMPLANT
GLOVE SKINSENSE STRL SZ7.0 (GLOVE) IMPLANT
KIT VITRECTOMY (OPHTHALMIC RELATED) IMPLANT
PAD ARMBOARD 7.5X6 YLW CONV (MISCELLANEOUS) ×1 IMPLANT
PROC W NO LENS (INTRAOCULAR LENS)
PROC W SPEC LENS (INTRAOCULAR LENS)
PROCESS W NO LENS (INTRAOCULAR LENS) IMPLANT
PROCESS W SPEC LENS (INTRAOCULAR LENS) IMPLANT
RING MALYGIN (MISCELLANEOUS) ×1 IMPLANT
SIGHTPATH CAT PROC W REG LENS (Ophthalmic Related) ×2 IMPLANT
SYR TB 1ML LL NO SAFETY (SYRINGE) ×1 IMPLANT
VISCOELASTIC ADDITIONAL (OPHTHALMIC RELATED) IMPLANT
WATER STERILE IRR 250ML POUR (IV SOLUTION) ×1 IMPLANT

## 2011-11-01 NOTE — Anesthesia Preprocedure Evaluation (Signed)
Anesthesia Evaluation  Patient identified by MRN, date of birth, ID band Patient awake    Reviewed: Allergy & Precautions, H&P , NPO status , Patient's Chart, lab work & pertinent test results  Airway Mallampati: II      Dental  (+) Teeth Intact   Pulmonary  clear to auscultation        Cardiovascular hypertension, Pt. on medications Regular Normal    Neuro/Psych CVA    GI/Hepatic GERD-  ,  Endo/Other  Diabetes mellitus-, Well Controlled, Type 2, Insulin Dependent  Renal/GU      Musculoskeletal   Abdominal   Peds  Hematology   Anesthesia Other Findings   Reproductive/Obstetrics                           Anesthesia Physical Anesthesia Plan  ASA: III  Anesthesia Plan: MAC   Post-op Pain Management:    Induction: Intravenous  Airway Management Planned: Nasal Cannula  Additional Equipment:   Intra-op Plan:   Post-operative Plan:   Informed Consent: I have reviewed the patients History and Physical, chart, labs and discussed the procedure including the risks, benefits and alternatives for the proposed anesthesia with the patient or authorized representative who has indicated his/her understanding and acceptance.     Plan Discussed with:   Anesthesia Plan Comments: (Pt c/o "hurt" last KPE.)        Anesthesia Quick Evaluation

## 2011-11-01 NOTE — Anesthesia Postprocedure Evaluation (Signed)
  Anesthesia Post-op Note  Patient: Nancy Sutton  Procedure(s) Performed:  CATARACT EXTRACTION PHACO AND INTRAOCULAR LENS PLACEMENT (IOC) - CDE 12.48  Patient Location: PACU and Short Stay  Anesthesia Type: MAC  Level of Consciousness: awake, alert , oriented and patient cooperative  Airway and Oxygen Therapy: Patient Spontanous Breathing  Post-op Pain: none  Post-op Assessment: Post-op Vital signs reviewed, Patient's Cardiovascular Status Stable, Respiratory Function Stable, Patent Airway and No signs of Nausea or vomiting  Post-op Vital Signs: Reviewed and stable  Complications: No apparent anesthesia complications

## 2011-11-01 NOTE — Transfer of Care (Signed)
Immediate Anesthesia Transfer of Care Note  Patient: Nancy Sutton  Procedure(s) Performed:  CATARACT EXTRACTION PHACO AND INTRAOCULAR LENS PLACEMENT (IOC) - CDE 12.48  Patient Location: PACU and Short Stay  Anesthesia Type: MAC  Level of Consciousness: awake, alert , oriented and patient cooperative  Airway & Oxygen Therapy: Patient Spontanous Breathing  Post-op Assessment: Report given to PACU RN, Post -op Vital signs reviewed and stable and Patient moving all extremities  Post vital signs: Reviewed and stable  Complications: No apparent anesthesia complications

## 2011-11-01 NOTE — H&P (Signed)
I have reviewed the H&P, the patient was re-examined, and I have identified no interval changes in medical condition and plan of care since the history and physical of record  

## 2011-11-01 NOTE — Brief Op Note (Signed)
Pre-Op Dx: Cataract OD Post-Op Dx: Cataract OD Surgeon: Bani Gianfrancesco Anesthesia: Topical with MAC Implant: B&L enVista Blood Loss: None Specimen: None Complications: None 

## 2011-11-02 ENCOUNTER — Encounter (HOSPITAL_COMMUNITY): Payer: Self-pay | Admitting: Ophthalmology

## 2011-11-02 NOTE — Op Note (Signed)
NAMEZABDI, MIS NO.:  [id number]  MEDICAL RECORD NO.:  [id number]  LOCATION:  [facility]                          FACILITY:  [facility]  PHYSICIAN:  Susanne Greenhouse, MD       DATE OF BIRTH:  Jan 31, 1946  DATE OF PROCEDURE:  11/01/2011 DATE OF DISCHARGE:  11/01/2011                              OPERATIVE REPORT   PREOPERATIVE DIAGNOSIS:  Nuclear cataract, right eye, diagnosis code 366.16.  POSTOPERATIVE DIAGNOSIS:  Nuclear cataract, right eye, diagnosis code 366.16.  OPERATION PERFORMED:  Phacoemulsification with posterior chamber intraocular lens implantation, right eye.  SURGEON:  Bonne Dolores. Julia Kulzer, MD  ANESTHESIA:  General endotracheal anesthesia.  OPERATIVE SUMMARY:  In the preoperative area, dilating drops were placed into the right eye.  The patient was then brought into the operating room where he was placed under general anesthesia.  The eye was then prepped and draped.  Beginning with a 75 blade, a paracentesis port was made at the surgeon's 2 o'clock position.  The anterior chamber was then filled with a 1% nonpreserved lidocaine solution with epinephrine.  This was followed by Viscoat to deepen the chamber.  A small fornix-based peritomy was performed superiorly.  Next, a single iris hook was placed through the limbus superiorly.  A 2.4-mm keratome blade was then used to make a clear corneal incision over the iris hook.  A bent cystotome needle and Utrata forceps were used to create a continuous tear capsulotomy.  Hydrodissection was performed using balanced salt solution on a fine cannula.  The lens nucleus was then removed using phacoemulsification in a quadrant cracking technique.  The cortical material was then removed with irrigation and aspiration.  The capsular bag and anterior chamber were refilled with Provisc.  The wound was widened to approximately 3 mm and a posterior chamber intraocular lens was placed into the capsular bag without difficulty  using an [employer] lens injecting system.  A single 10-0 nylon suture was then used to close the incision as well as stromal hydration.  The Provisc was removed from the anterior chamber and capsular bag with irrigation and aspiration.  At this point, the wounds were tested for leak, which were negative.  The anterior chamber remained deep and stable.  The patient tolerated the procedure well.  There were no operative complications, and he awoke from general anesthesia without problem.  No surgical specimens.  Prosthetic device used is a Bausch and Lomb posterior chamber lens, model enVista, model number MX60, power of 16.5, serial number is [id number].          ______________________________ Susanne Greenhouse, MD     KEH/MEDQ  D:  11/01/2011  T:  11/02/2011  Job:  [id number]

## 2011-12-21 DIAGNOSIS — H53469 Homonymous bilateral field defects, unspecified side: Secondary | ICD-10-CM | POA: Diagnosis not present

## 2011-12-21 DIAGNOSIS — I63219 Cerebral infarction due to unspecified occlusion or stenosis of unspecified vertebral arteries: Secondary | ICD-10-CM | POA: Diagnosis not present

## 2011-12-27 DIAGNOSIS — IMO0001 Reserved for inherently not codable concepts without codable children: Secondary | ICD-10-CM | POA: Diagnosis not present

## 2011-12-27 DIAGNOSIS — E785 Hyperlipidemia, unspecified: Secondary | ICD-10-CM | POA: Diagnosis not present

## 2011-12-27 DIAGNOSIS — I6350 Cerebral infarction due to unspecified occlusion or stenosis of unspecified cerebral artery: Secondary | ICD-10-CM | POA: Diagnosis not present

## 2012-01-03 DIAGNOSIS — IMO0001 Reserved for inherently not codable concepts without codable children: Secondary | ICD-10-CM | POA: Diagnosis not present

## 2012-01-03 DIAGNOSIS — E785 Hyperlipidemia, unspecified: Secondary | ICD-10-CM | POA: Diagnosis not present

## 2012-01-13 DIAGNOSIS — H00039 Abscess of eyelid unspecified eye, unspecified eyelid: Secondary | ICD-10-CM | POA: Diagnosis not present

## 2012-01-13 DIAGNOSIS — H1030 Unspecified acute conjunctivitis, unspecified eye: Secondary | ICD-10-CM | POA: Diagnosis not present

## 2012-01-13 DIAGNOSIS — H10029 Other mucopurulent conjunctivitis, unspecified eye: Secondary | ICD-10-CM | POA: Diagnosis not present

## 2012-01-13 DIAGNOSIS — H01009 Unspecified blepharitis unspecified eye, unspecified eyelid: Secondary | ICD-10-CM | POA: Diagnosis not present

## 2012-02-21 DIAGNOSIS — E119 Type 2 diabetes mellitus without complications: Secondary | ICD-10-CM | POA: Diagnosis not present

## 2012-02-21 DIAGNOSIS — H5340 Unspecified visual field defects: Secondary | ICD-10-CM | POA: Diagnosis not present

## 2012-04-13 DIAGNOSIS — E785 Hyperlipidemia, unspecified: Secondary | ICD-10-CM | POA: Diagnosis not present

## 2012-04-13 DIAGNOSIS — IMO0001 Reserved for inherently not codable concepts without codable children: Secondary | ICD-10-CM | POA: Diagnosis not present

## 2012-04-19 DIAGNOSIS — E785 Hyperlipidemia, unspecified: Secondary | ICD-10-CM | POA: Diagnosis not present

## 2012-04-19 DIAGNOSIS — IMO0001 Reserved for inherently not codable concepts without codable children: Secondary | ICD-10-CM | POA: Diagnosis not present

## 2012-06-14 DIAGNOSIS — I63219 Cerebral infarction due to unspecified occlusion or stenosis of unspecified vertebral arteries: Secondary | ICD-10-CM | POA: Diagnosis not present

## 2012-06-14 DIAGNOSIS — H53469 Homonymous bilateral field defects, unspecified side: Secondary | ICD-10-CM | POA: Diagnosis not present

## 2012-07-07 DIAGNOSIS — R7301 Impaired fasting glucose: Secondary | ICD-10-CM | POA: Diagnosis not present

## 2012-07-07 DIAGNOSIS — E161 Other hypoglycemia: Secondary | ICD-10-CM | POA: Diagnosis not present

## 2012-07-10 DIAGNOSIS — E1101 Type 2 diabetes mellitus with hyperosmolarity with coma: Secondary | ICD-10-CM | POA: Diagnosis not present

## 2012-07-10 DIAGNOSIS — IMO0001 Reserved for inherently not codable concepts without codable children: Secondary | ICD-10-CM | POA: Diagnosis not present

## 2012-07-10 DIAGNOSIS — E785 Hyperlipidemia, unspecified: Secondary | ICD-10-CM | POA: Diagnosis not present

## 2012-07-13 DIAGNOSIS — IMO0002 Reserved for concepts with insufficient information to code with codable children: Secondary | ICD-10-CM | POA: Diagnosis not present

## 2012-07-13 DIAGNOSIS — E785 Hyperlipidemia, unspecified: Secondary | ICD-10-CM | POA: Diagnosis not present

## 2012-07-13 DIAGNOSIS — I10 Essential (primary) hypertension: Secondary | ICD-10-CM | POA: Diagnosis not present

## 2012-07-13 DIAGNOSIS — E1065 Type 1 diabetes mellitus with hyperglycemia: Secondary | ICD-10-CM | POA: Diagnosis not present

## 2012-07-31 ENCOUNTER — Other Ambulatory Visit (HOSPITAL_COMMUNITY): Payer: Self-pay | Admitting: Family Medicine

## 2012-07-31 DIAGNOSIS — I10 Essential (primary) hypertension: Secondary | ICD-10-CM | POA: Diagnosis not present

## 2012-07-31 DIAGNOSIS — I63239 Cerebral infarction due to unspecified occlusion or stenosis of unspecified carotid arteries: Secondary | ICD-10-CM

## 2012-07-31 DIAGNOSIS — I6789 Other cerebrovascular disease: Secondary | ICD-10-CM

## 2012-07-31 DIAGNOSIS — E785 Hyperlipidemia, unspecified: Secondary | ICD-10-CM | POA: Diagnosis not present

## 2012-07-31 DIAGNOSIS — IMO0002 Reserved for concepts with insufficient information to code with codable children: Secondary | ICD-10-CM | POA: Diagnosis not present

## 2012-08-03 ENCOUNTER — Ambulatory Visit (HOSPITAL_COMMUNITY)
Admission: RE | Admit: 2012-08-03 | Discharge: 2012-08-03 | Disposition: A | Discharge status: Home / Self Care | Payer: Medicare Other | Source: Ambulatory Visit | Attending: Family Medicine | Admitting: Family Medicine

## 2012-08-03 DIAGNOSIS — I63239 Cerebral infarction due to unspecified occlusion or stenosis of unspecified carotid arteries: Secondary | ICD-10-CM

## 2012-08-03 DIAGNOSIS — I6529 Occlusion and stenosis of unspecified carotid artery: Secondary | ICD-10-CM | POA: Diagnosis not present

## 2012-08-03 DIAGNOSIS — I6789 Other cerebrovascular disease: Secondary | ICD-10-CM

## 2012-08-10 DIAGNOSIS — IMO0002 Reserved for concepts with insufficient information to code with codable children: Secondary | ICD-10-CM | POA: Diagnosis not present

## 2012-08-10 DIAGNOSIS — E1065 Type 1 diabetes mellitus with hyperglycemia: Secondary | ICD-10-CM | POA: Diagnosis not present

## 2012-08-29 DIAGNOSIS — E109 Type 1 diabetes mellitus without complications: Secondary | ICD-10-CM | POA: Diagnosis not present

## 2012-08-29 DIAGNOSIS — Z713 Dietary counseling and surveillance: Secondary | ICD-10-CM | POA: Diagnosis not present

## 2012-08-29 DIAGNOSIS — Z794 Long term (current) use of insulin: Secondary | ICD-10-CM | POA: Diagnosis not present

## 2012-09-18 DIAGNOSIS — Z713 Dietary counseling and surveillance: Secondary | ICD-10-CM | POA: Diagnosis not present

## 2012-09-18 DIAGNOSIS — E109 Type 1 diabetes mellitus without complications: Secondary | ICD-10-CM | POA: Diagnosis not present

## 2012-09-18 DIAGNOSIS — Z794 Long term (current) use of insulin: Secondary | ICD-10-CM | POA: Diagnosis not present

## 2012-10-03 DIAGNOSIS — E559 Vitamin D deficiency, unspecified: Secondary | ICD-10-CM | POA: Diagnosis not present

## 2012-10-03 DIAGNOSIS — E1065 Type 1 diabetes mellitus with hyperglycemia: Secondary | ICD-10-CM | POA: Diagnosis not present

## 2012-10-03 DIAGNOSIS — IMO0002 Reserved for concepts with insufficient information to code with codable children: Secondary | ICD-10-CM | POA: Diagnosis not present

## 2012-10-10 DIAGNOSIS — IMO0002 Reserved for concepts with insufficient information to code with codable children: Secondary | ICD-10-CM | POA: Diagnosis not present

## 2012-10-10 DIAGNOSIS — E785 Hyperlipidemia, unspecified: Secondary | ICD-10-CM | POA: Diagnosis not present

## 2012-10-10 DIAGNOSIS — E1065 Type 1 diabetes mellitus with hyperglycemia: Secondary | ICD-10-CM | POA: Diagnosis not present

## 2012-10-24 DIAGNOSIS — Z961 Presence of intraocular lens: Secondary | ICD-10-CM | POA: Diagnosis not present

## 2012-10-24 DIAGNOSIS — H524 Presbyopia: Secondary | ICD-10-CM | POA: Diagnosis not present

## 2012-10-24 DIAGNOSIS — E1139 Type 2 diabetes mellitus with other diabetic ophthalmic complication: Secondary | ICD-10-CM | POA: Diagnosis not present

## 2012-10-24 DIAGNOSIS — H40019 Open angle with borderline findings, low risk, unspecified eye: Secondary | ICD-10-CM | POA: Diagnosis not present

## 2012-11-14 DIAGNOSIS — H5200 Hypermetropia, unspecified eye: Secondary | ICD-10-CM | POA: Diagnosis not present

## 2012-11-14 DIAGNOSIS — H52229 Regular astigmatism, unspecified eye: Secondary | ICD-10-CM | POA: Diagnosis not present

## 2012-11-14 DIAGNOSIS — H40019 Open angle with borderline findings, low risk, unspecified eye: Secondary | ICD-10-CM | POA: Diagnosis not present

## 2012-11-14 DIAGNOSIS — Z961 Presence of intraocular lens: Secondary | ICD-10-CM | POA: Diagnosis not present

## 2012-11-23 DIAGNOSIS — Z713 Dietary counseling and surveillance: Secondary | ICD-10-CM | POA: Diagnosis not present

## 2012-11-23 DIAGNOSIS — E109 Type 1 diabetes mellitus without complications: Secondary | ICD-10-CM | POA: Diagnosis not present

## 2012-11-23 DIAGNOSIS — Z794 Long term (current) use of insulin: Secondary | ICD-10-CM | POA: Diagnosis not present

## 2012-11-25 ENCOUNTER — Encounter (HOSPITAL_COMMUNITY): Payer: Self-pay | Admitting: *Deleted

## 2012-11-25 ENCOUNTER — Emergency Department (HOSPITAL_COMMUNITY): Payer: Medicare Other

## 2012-11-25 ENCOUNTER — Emergency Department (HOSPITAL_COMMUNITY)
Admission: EM | Admit: 2012-11-25 | Discharge: 2012-11-25 | Disposition: A | Discharge status: Home / Self Care | Payer: Medicare Other | Attending: Emergency Medicine | Admitting: Emergency Medicine

## 2012-11-25 DIAGNOSIS — E119 Type 2 diabetes mellitus without complications: Secondary | ICD-10-CM | POA: Insufficient documentation

## 2012-11-25 DIAGNOSIS — Z8739 Personal history of other diseases of the musculoskeletal system and connective tissue: Secondary | ICD-10-CM | POA: Insufficient documentation

## 2012-11-25 DIAGNOSIS — Z79899 Other long term (current) drug therapy: Secondary | ICD-10-CM | POA: Insufficient documentation

## 2012-11-25 DIAGNOSIS — Z794 Long term (current) use of insulin: Secondary | ICD-10-CM | POA: Diagnosis not present

## 2012-11-25 DIAGNOSIS — K219 Gastro-esophageal reflux disease without esophagitis: Secondary | ICD-10-CM | POA: Insufficient documentation

## 2012-11-25 DIAGNOSIS — H539 Unspecified visual disturbance: Secondary | ICD-10-CM | POA: Insufficient documentation

## 2012-11-25 DIAGNOSIS — E7800 Pure hypercholesterolemia, unspecified: Secondary | ICD-10-CM | POA: Insufficient documentation

## 2012-11-25 DIAGNOSIS — R42 Dizziness and giddiness: Secondary | ICD-10-CM | POA: Diagnosis not present

## 2012-11-25 DIAGNOSIS — H579 Unspecified disorder of eye and adnexa: Secondary | ICD-10-CM | POA: Insufficient documentation

## 2012-11-25 DIAGNOSIS — G9389 Other specified disorders of brain: Secondary | ICD-10-CM | POA: Diagnosis not present

## 2012-11-25 DIAGNOSIS — Z8673 Personal history of transient ischemic attack (TIA), and cerebral infarction without residual deficits: Secondary | ICD-10-CM | POA: Insufficient documentation

## 2012-11-25 LAB — BASIC METABOLIC PANEL
Calcium: 9.8 mg/dL (ref 8.4–10.5)
GFR calc Af Amer: >90 mL/min (ref >90)
GFR calc non Af Amer: 89 mL/min — ABNORMAL LOW (ref >90)
Glucose, Bld: 241 mg/dL — ABNORMAL HIGH (ref 70–99)
Potassium: 3.9 mEq/L (ref 3.5–5.1)
Sodium: 135 mEq/L (ref 135–145)

## 2012-11-25 LAB — CBC
MCH: 30 pg (ref 26.0–34.0)
MCHC: 33.7 g/dL (ref 30.0–36.0)
Platelets: 246 10*3/uL (ref 150–400)
RDW: 13.2 % (ref 11.5–15.5)

## 2012-11-25 LAB — GLUCOSE, CAPILLARY: Glucose-Capillary: 237 mg/dL — ABNORMAL HIGH (ref 70–99)

## 2012-11-25 NOTE — ED Notes (Signed)
Pt presents to er with c/o dizziness and blurry vision that she noticed when she got up yesterday am, has hx of previous stroke that presented with the same symptoms, states that she was too scared to come to er yesterday but when she woke up with the same symptoms this am and the vision was worse she called her neurologist who advised pt to come to er.

## 2012-11-25 NOTE — ED Provider Notes (Signed)
History     This chart was scribed for Nancy Co, MD, MD by Smitty Pluck, ED Scribe. The patient was seen in room APA07/[facility]07 and the patient's care was started at 10:44 AM.   CSN: [id number]  Arrival date & time 11/25/12  0959      Chief Complaint  Patient presents with  . Dizziness  . Eye Problem    The history is provided by the patient. No language interpreter was used.   Nancy Sutton is a 67 y.o. female with hx of DM and stoke (July 2012 affecting vision only) who presents to the Emergency Department complaining of constant, moderate light headedness and blurred vision from both eyes onset 1 day ago. She reports that her eye sight has been blurry since past stroke but symptoms have worsened. Pt reports current symptoms feel similar to past stoke symptoms 1 day ago. She states when she got out of bed she was light headed and felt off balanced. She states that light headedness is aggravated by standing up. Pt denies eye pain, fever, chills, nausea, vomiting, diarrhea, weakness, cough, SOB and any other pain. Pt has had multiple ultrasounds checking her carotid after stroke. She reports having hx of cataract surgery on both eyes. Pt wears glasses but states she was told that she did not need them. She takes plavix. Pt denies smoking cigarettes. Pt is scheduled to see neurologist 12-14-12.   Past Medical History  Diagnosis Date  . Diabetes mellitus   . Hypercholesteremia   . GERD (gastroesophageal reflux disease)   . 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 Arthritis     Past Surgical History  Procedure Laterality Date  . Eye surgery  10-2010    left eye removal of cataract  . Tubal ligation  1984  . Cataract extraction w/phaco  11/01/2011    Procedure: CATARACT EXTRACTION PHACO AND INTRAOCULAR LENS PLACEMENT (IOC);  Surgeon: Gemma Payor, MD;  Location: [facility];  Service: Ophthalmology;  Laterality: Right;  CDE 12.48    Family History  Problem Relation Age of Onset   . Anesthesia problems Neg Hx   . Hypotension Neg Hx   . Malignant hyperthermia Neg Hx   . Pseudochol deficiency Neg Hx     History  Substance Use Topics  . Smoking status: Never Smoker   . Smokeless tobacco: Not on file  . Alcohol Use: No    OB History   Grav Para Term Preterm Abortions TAB SAB Ect Mult Living                  Review of Systems 10 Systems reviewed and all are negative for acute change except as noted in the HPI.   Allergies  Ampicillin; Bromday; and Tetanus toxoids  Home Medications   Current Outpatient Rx  Name  Route  Sig  Dispense  Refill  . clopidogrel (PLAVIX) 75 MG tablet   Oral   Take 75 mg by mouth daily.         . insulin aspart (NOVOLOG FLEXPEN) 100 UNIT/ML injection   Subcutaneous   Inject 4 Units into the skin 3 (three) times daily before meals.         . insulin detemir (LEVEMIR FLEXPEN) 100 UNIT/ML injection   Subcutaneous   Inject 15 Units into the skin at bedtime.         Marland Kitchen l-methylfolate-B6-B12 (METANX) 3-35-2 MG TABS   Oral   Take 1 tablet by mouth 2 (two)  times daily.         Marland Kitchen losartan (COZAAR) 25 MG tablet   Oral   Take 25 mg by mouth daily.         . metFORMIN (GLUCOPHAGE) 1000 MG tablet   Oral   Take 1,000 mg by mouth 2 (two) times daily.         . Multiple Vitamin (MULITIVITAMIN WITH MINERALS) TABS   Oral   Take 1 tablet by mouth daily.         . Multiple Vitamins-Minerals (HAIR/SKIN/NAILS) TABS   Oral   Take 1 tablet by mouth daily.         . naproxen sodium (ANAPROX) 220 MG tablet   Oral   Take 220 mg by mouth at bedtime.         . ranitidine (ZANTAC) 300 MG tablet   Oral   Take 300 mg by mouth daily.         . simvastatin (ZOCOR) 40 MG tablet   Oral   Take 40 mg by mouth every evening.         . sitaGLIPtin (JANUVIA) 100 MG tablet   Oral   Take 100 mg by mouth daily.         . vitamin C (ASCORBIC ACID) 500 MG tablet   Oral   Take 500 mg by mouth daily.         .  vitamin E (VITAMIN E) 400 UNIT capsule   Oral   Take 400 Units by mouth daily.           BP 171/67  Pulse 99  Temp(Src) 97.9 F (36.6 C) (Oral)  Resp 18  SpO2 99%  Physical Exam  Nursing note and vitals reviewed. Constitutional: She is oriented to person, place, and time. She appears well-developed and well-nourished. No distress.  HENT:  Head: Normocephalic and atraumatic.  Right Ear: External ear normal.  Left Ear: External ear normal.  Eyes: EOM are normal. Pupils are equal, round, and reactive to light.  Pupils 3mm   Neck: Normal range of motion.  Cardiovascular: Normal rate, regular rhythm and normal heart sounds.   Pulmonary/Chest: Effort normal and breath sounds normal.  Abdominal: Soft. She exhibits no distension. There is no tenderness.  Musculoskeletal: Normal range of motion.  Neurological: She is alert and oriented to person, place, and time.  5 out of 5 strength in bilateral upper lower extremity major muscle group   Skin: Skin is warm and dry.  Psychiatric: She has a normal mood and affect. Judgment normal.    ED Course  Procedures (including critical care time) DIAGNOSTIC STUDIES: Oxygen Saturation is 99% on room air, normal by my interpretation.    COORDINATION OF CARE: 10:50 AM Discussed ED treatment with pt and pt agrees.    Date: 11/25/2012  Rate: 96  Rhythm: normal sinus rhythm  QRS Axis: normal  Intervals: normal  ST/T Wave abnormalities: normal  Conduction Disutrbances: none  Narrative Interpretation:   Old EKG Reviewed: No significant changes noted     Labs Reviewed  GLUCOSE, CAPILLARY - Abnormal; Notable for the following:    Glucose-Capillary 237 (*)    All other components within normal limits  CBC - Abnormal; Notable for the following:    Hemoglobin 11.6 (*)    HCT 34.4 (*)    All other components within normal limits  BASIC METABOLIC PANEL - Abnormal; Notable for the following:    Glucose, Bld 241 (*)    BUN  30 (*)    GFR  calc non Af Amer 89 (*)    All other components within normal limits   Ct Head Wo Contrast  11/25/2012  *RADIOLOGY REPORT*  Clinical Data: Dizziness.  Eye problem.  CT HEAD WITHOUT CONTRAST  Technique:  Contiguous axial images were obtained from the base of the skull through the vertex without contrast.  Comparison: Head CT 04/30/2011.  Findings: There is a relatively well-defined area of decreased attenuation in the posterior right cerebral hemisphere predominately in the right occipital lobe, compatible with an area of encephalomalacia related to remote to right PCA territory infarction (as demonstrated on prior study from 2012).  No definite signs of acute/subacute cerebral ischemia, no evidence of acute intracranial hemorrhage, no mass, mass effect, hydrocephalus or other abnormal intra or extra-axial fluid collections.  Visualized paranasal sinuses and mastoids are well pneumatized.  No acute displaced skull fractures are noted.  IMPRESSION: 1.  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  No acute intracranial abnormalities are noted.   Original Report Authenticated By: Trudie Reed, M.D.      1. Visual changes       MDM  My suspicion for stroke is very low.  CT scan without acute abnormalities.  Old infarct noted.  The patient started on Plavix.  He did doesn't associate normal carotids and normal echo.  EKG demonstrates normal sinus rhythm in the emergency room.  She is otherwise a completely normal neurologic exam.  Her eye findings are bilaterally.  Please see nursing notes for visual acuity.  She wears corrective lenses.  She will need to follow closely with her neurologist and ophthalmologist.  I instructed the patient to return to the ER for new or worsening symptoms.  No eye pain.      I personally performed the services described in this documentation, which was scribed in my presence. The recorded information  has been reviewed and is accurate.      Nancy Co, MD 11/25/12 1315

## 2012-12-14 ENCOUNTER — Encounter: Payer: Self-pay | Admitting: Neurology

## 2012-12-14 ENCOUNTER — Ambulatory Visit (INDEPENDENT_AMBULATORY_CARE_PROVIDER_SITE_OTHER): Payer: Medicare Other | Admitting: Neurology

## 2012-12-14 VITALS — BP 159/71 | HR 91 | Temp 96.8°F | Ht 60.0 in | Wt 127.0 lb

## 2012-12-14 DIAGNOSIS — H53469 Homonymous bilateral field defects, unspecified side: Secondary | ICD-10-CM | POA: Diagnosis not present

## 2012-12-14 DIAGNOSIS — H53462 Homonymous bilateral field defects, left side: Secondary | ICD-10-CM

## 2012-12-14 DIAGNOSIS — H5310 Unspecified subjective visual disturbances: Secondary | ICD-10-CM

## 2012-12-14 DIAGNOSIS — R42 Dizziness and giddiness: Secondary | ICD-10-CM | POA: Diagnosis not present

## 2012-12-14 NOTE — Patient Instructions (Signed)
Continue Plavix for secondary stroke prevention with strict control of hypertension with blood pressure goal below 120/80, lipids goal LDL below 70 mg percent and  diabetes with Hb A1c goal below 6.5%. Return for followup in 6 months with Darrol Angel, nurse practitioner.

## 2012-12-14 NOTE — Progress Notes (Signed)
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  She returns for followup of the last visit on 06/14/12. She states she'll doing well she's had no recurrent stroke or TIA symptoms. She is tolerating plavix without significant bleeding but does complain of mild bruising. She states her sugars have all been under good control. She had profile checked by her primary physician a few months ago and was apparently unremarkable though I do not have those results. She states her blood pressure is also under good control. She's continues to have peripheral vision loss in the left and has not been driving. She had a brief episode of dizziness and blurred vision 2 weeks ago which lasted couple of days. She was seen at [facility] emergency room where CT head showed no acute abnormalities. She denied any accompanying headache or focal neurological symptoms. She was seen by ophthalmologist and diagnosed with glaucoma and plans treatment of that stone. ROS: 14 system review of systems is positive only for blurred vision, loss of vision and gait difficulty. Physical Exam General: well developed, well nourished, seated, in no evident distress Head: head normocephalic and atraumatic. Orohparynx benign Neck: supple with no carotid or supraclavicular bruits Cardiovascular: regular rate and rhythm, no murmurs  Neurologic Exam Mental Status: Awake and fully alert. Oriented to place and time. Recent and remote memory intact. Attention span, concentration and fund of knowledge appropriate. Mood and affect appropriate.  Cranial Nerves: Fundoscopic exam reveals sharp disc margins. Pupils equal, briskly reactive to light. Extraocular movements full without nystagmus. Visual fields show dense left homonymous hemianopia to confrontation. Hearing intact and s. Facial  sensation intact. Face, tongue, palate move normally and symmetrically. Neck flexion and extension normal.  Motor: Normal bulk and tone. Normal strength in all tested extremity muscles. Sensory.: intact to tough and pinprick and vibratory.  Coordination: Rapid alternating movements normal in all extremities. Finger-to-nose and heel-to-shin performed accurately bilaterally. Gait and Station: Arises from chair without difficulty. Stance is normal. Gait demonstrates normal stride length and balance . Able to heel, toe and tandem walk with mild difficulty.  Reflexes: 1+ and symmetric. Toes downgoing.     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 Vascular risk factors of diabetes, hyperlipidemia and interpreted atherosclerosis. New episode of transient dizziness and blurred vision 2 weeks or flow unlikely to be a TIA   PLAN: Continue Plavix for second stroke prevention with strict control of diabetes with hemoglobin A1c goal below 6.5%, hypertension with blood pressure goal below 120/80 and lipids with LDL cholesterol goal below 70 mg percent. Followup in 6 months with Stephens Shire nurse practitioner.

## 2012-12-15 ENCOUNTER — Telehealth: Payer: Self-pay | Admitting: Neurology

## 2012-12-15 ENCOUNTER — Encounter: Payer: Self-pay | Admitting: Neurology

## 2012-12-15 NOTE — Telephone Encounter (Signed)
Pt called for letter, similar to the one that she has had in the past.  Done and placed at Dr. Marlis Edelson desk for signature.

## 2012-12-18 NOTE — Telephone Encounter (Signed)
Note signed and faxed.[phone].

## 2013-01-08 DIAGNOSIS — H902 Conductive hearing loss, unspecified: Secondary | ICD-10-CM | POA: Diagnosis not present

## 2013-01-08 DIAGNOSIS — H60399 Other infective otitis externa, unspecified ear: Secondary | ICD-10-CM | POA: Diagnosis not present

## 2013-01-08 DIAGNOSIS — H6120 Impacted cerumen, unspecified ear: Secondary | ICD-10-CM | POA: Diagnosis not present

## 2013-01-09 DIAGNOSIS — E1065 Type 1 diabetes mellitus with hyperglycemia: Secondary | ICD-10-CM | POA: Diagnosis not present

## 2013-01-09 DIAGNOSIS — E785 Hyperlipidemia, unspecified: Secondary | ICD-10-CM | POA: Diagnosis not present

## 2013-01-09 DIAGNOSIS — IMO0002 Reserved for concepts with insufficient information to code with codable children: Secondary | ICD-10-CM | POA: Diagnosis not present

## 2013-01-23 ENCOUNTER — Other Ambulatory Visit (INDEPENDENT_AMBULATORY_CARE_PROVIDER_SITE_OTHER): Payer: Self-pay | Admitting: Internal Medicine

## 2013-01-23 DIAGNOSIS — E785 Hyperlipidemia, unspecified: Secondary | ICD-10-CM | POA: Diagnosis not present

## 2013-01-23 DIAGNOSIS — E1129 Type 2 diabetes mellitus with other diabetic kidney complication: Secondary | ICD-10-CM | POA: Diagnosis not present

## 2013-01-23 DIAGNOSIS — I10 Essential (primary) hypertension: Secondary | ICD-10-CM | POA: Diagnosis not present

## 2013-01-23 DIAGNOSIS — E1165 Type 2 diabetes mellitus with hyperglycemia: Secondary | ICD-10-CM | POA: Diagnosis not present

## 2013-01-23 DIAGNOSIS — IMO0002 Reserved for concepts with insufficient information to code with codable children: Secondary | ICD-10-CM | POA: Diagnosis not present

## 2013-01-24 ENCOUNTER — Encounter (INDEPENDENT_AMBULATORY_CARE_PROVIDER_SITE_OTHER): Payer: Self-pay | Admitting: *Deleted

## 2013-01-24 ENCOUNTER — Telehealth: Payer: Self-pay | Admitting: Neurology

## 2013-01-24 DIAGNOSIS — IMO0002 Reserved for concepts with insufficient information to code with codable children: Secondary | ICD-10-CM | POA: Diagnosis not present

## 2013-01-24 DIAGNOSIS — H53462 Homonymous bilateral field defects, left side: Secondary | ICD-10-CM

## 2013-01-24 DIAGNOSIS — E1065 Type 1 diabetes mellitus with hyperglycemia: Secondary | ICD-10-CM | POA: Diagnosis not present

## 2013-01-24 MED ORDER — CLOPIDOGREL BISULFATE 75 MG PO TABS: 75.0000 mg | ORAL_TABLET | Freq: Every day | ORAL | Status: DC

## 2013-01-24 NOTE — Telephone Encounter (Signed)
Pt calling for refill of her medication plavix.   Please refill. I called pt and let her know that I refillled for her at [facility] in [place], [place].  Plavix 75mg  po daily #30 with refill x 6.

## 2013-01-24 NOTE — Telephone Encounter (Signed)
Rx eprescribed 

## 2013-01-25 NOTE — Telephone Encounter (Signed)
thanks

## 2013-01-30 ENCOUNTER — Telehealth: Payer: Self-pay | Admitting: Neurology

## 2013-01-30 ENCOUNTER — Ambulatory Visit (INDEPENDENT_AMBULATORY_CARE_PROVIDER_SITE_OTHER): Payer: Medicare Other | Admitting: Internal Medicine

## 2013-01-30 ENCOUNTER — Other Ambulatory Visit (INDEPENDENT_AMBULATORY_CARE_PROVIDER_SITE_OTHER): Payer: Self-pay | Admitting: *Deleted

## 2013-01-30 ENCOUNTER — Encounter (INDEPENDENT_AMBULATORY_CARE_PROVIDER_SITE_OTHER): Payer: Self-pay | Admitting: Internal Medicine

## 2013-01-30 ENCOUNTER — Encounter (INDEPENDENT_AMBULATORY_CARE_PROVIDER_SITE_OTHER): Payer: Self-pay | Admitting: *Deleted

## 2013-01-30 VITALS — BP 122/48 | HR 84 | Ht 59.5 in | Wt 122.3 lb

## 2013-01-30 DIAGNOSIS — R1310 Dysphagia, unspecified: Secondary | ICD-10-CM | POA: Diagnosis not present

## 2013-01-30 DIAGNOSIS — I639 Cerebral infarction, unspecified: Secondary | ICD-10-CM | POA: Insufficient documentation

## 2013-01-30 DIAGNOSIS — E139 Other specified diabetes mellitus without complications: Secondary | ICD-10-CM | POA: Insufficient documentation

## 2013-01-30 DIAGNOSIS — E785 Hyperlipidemia, unspecified: Secondary | ICD-10-CM | POA: Insufficient documentation

## 2013-01-30 DIAGNOSIS — K219 Gastro-esophageal reflux disease without esophagitis: Secondary | ICD-10-CM

## 2013-01-30 DIAGNOSIS — M1990 Unspecified osteoarthritis, unspecified site: Secondary | ICD-10-CM | POA: Insufficient documentation

## 2013-01-30 DIAGNOSIS — R1319 Other dysphagia: Secondary | ICD-10-CM

## 2013-01-30 DIAGNOSIS — E782 Mixed hyperlipidemia: Secondary | ICD-10-CM | POA: Insufficient documentation

## 2013-01-30 DIAGNOSIS — E1059 Type 1 diabetes mellitus with other circulatory complications: Secondary | ICD-10-CM | POA: Insufficient documentation

## 2013-01-30 NOTE — Patient Instructions (Addendum)
EGD/ED with Dr. Rehman. 

## 2013-01-30 NOTE — Progress Notes (Signed)
Subjective:     Patient ID: Nancy Sutton, female   DOB: 1945/12/04, 67 y.o.   MRN: [id number]  HPI Referred to our office by Dr. Regino Schultze for dysphagia. When she eats, the food will lodge in her mid-esophagus.  Does not occur at every meal. Really doesn't matter what she eats, it will lodge  She will physically stretch and the bolus will pass down.  She says its the first 2-3 bites of food  will lodge and after that she does have any problems swallowing . Symptoms x 2 months.  Her acid reflux is controlled with the Zantac. Appetite is good. She has gained weight. BMs are normal. No melena or bright red rectal bleeding. 09/24/2011 H and H 11.9 and 35.8, MCV 86.1.   Review of Systems see hpi Current Outpatient Prescriptions  Medication Sig Dispense Refill  . calcium carbonate (TUMS - DOSED IN MG ELEMENTAL CALCIUM) 500 MG chewable tablet Chew 2 tablets by mouth at bedtime.      . clopidogrel (PLAVIX) 75 MG tablet Take 1 tablet (75 mg total) by mouth daily.  30 tablet  6  . insulin aspart (NOVOLOG FLEXPEN) 100 UNIT/ML injection Inject 8 Units into the skin 4 (four) times daily.       . insulin detemir (LEVEMIR FLEXPEN) 100 UNIT/ML injection Inject 24 Units into the skin at bedtime.       Marland Kitchen l-methylfolate-B6-B12 (METANX) 3-35-2 MG TABS Take 1 tablet by mouth 2 (two) times daily.      Marland Kitchen losartan (COZAAR) 25 MG tablet Take 25 mg by mouth daily.      . Multiple Vitamin (MULITIVITAMIN WITH MINERALS) TABS Take 1 tablet by mouth daily.      . Multiple Vitamins-Minerals (HAIR/SKIN/NAILS) TABS Take 1 tablet by mouth daily.      . ranitidine (ZANTAC) 300 MG tablet Take 300 mg by mouth daily.      . simvastatin (ZOCOR) 40 MG tablet Take 40 mg by mouth every evening.      . vitamin C (ASCORBIC ACID) 500 MG tablet Take 500 mg by mouth daily.      . vitamin E (VITAMIN E) 400 UNIT capsule Take 400 Units by mouth daily.       No current facility-administered medications for this visit.   Past Medical History   Diagnosis Date  . Diabetes mellitus     since 2008  . Hypercholesteremia   . GERD (gastroesophageal reflux disease)   . 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 Arthritis   . Hyperlipidemia    Past Surgical History  Procedure Laterality Date  . Eye surgery  10-2010    left eye removal of cataract  . Tubal ligation  1984  . Cataract extraction w/phaco  11/01/2011    Procedure: CATARACT EXTRACTION PHACO AND INTRAOCULAR LENS PLACEMENT (IOC);  Surgeon: Gemma Payor, MD;  Location: [facility];  Service: Ophthalmology;  Laterality: Right;  CDE 12.48   Allergies  Allergen Reactions  . Ampicillin Other (See Comments)    Breaks out in bumps  . Bromday (Bromfenac Sodium) Itching and Swelling  . Tetanus Toxoids Other (See Comments)    Breaks out in bumps        Objective:   Physical Exam  Filed Vitals:   01/30/13 0932  BP: 122/48  Pulse: 84  Height: 4' 11.5" (1.511 m)  Weight: 122 lb 4.8 oz (55.475 kg)   Alert and oriented. Skin warm and dry. Oral mucosa is moist.   .  Sclera anicteric, conjunctivae is pink. Thyroid not enlarged. No cervical lymphadenopathy. Lungs clear. Heart regular rate and rhythm.  Abdomen is soft. Bowel sounds are positive. No hepatomegaly. No abdominal masses felt. No tenderness.  No edema to lower extremities.       Assessment:    Solid food dysphagia. Stricture needs to be ruled out.     Plan:     EGD/ED with Dr. Karilyn Cota

## 2013-02-01 ENCOUNTER — Encounter (HOSPITAL_COMMUNITY): Payer: Self-pay | Admitting: Pharmacy Technician

## 2013-02-02 ENCOUNTER — Telehealth: Payer: Self-pay | Admitting: *Deleted

## 2013-02-02 NOTE — Telephone Encounter (Signed)
Pt calling for clearance for GI procedure and being off plavix for 5 days.

## 2013-02-05 ENCOUNTER — Encounter (INDEPENDENT_AMBULATORY_CARE_PROVIDER_SITE_OTHER): Payer: Self-pay

## 2013-02-06 DIAGNOSIS — H40019 Open angle with borderline findings, low risk, unspecified eye: Secondary | ICD-10-CM | POA: Diagnosis not present

## 2013-02-06 DIAGNOSIS — Z961 Presence of intraocular lens: Secondary | ICD-10-CM | POA: Diagnosis not present

## 2013-02-07 ENCOUNTER — Telehealth: Payer: Self-pay | Admitting: *Deleted

## 2013-02-07 ENCOUNTER — Encounter: Payer: Self-pay | Admitting: *Deleted

## 2013-02-07 NOTE — Telephone Encounter (Signed)
Note made and to be faxed to GI.

## 2013-02-07 NOTE — Telephone Encounter (Signed)
I called pt back after she LMVM again for clearance for GI procedure.  LMVM for her that I will address with Dr. Pearlean Brownie.   He has not addressed from the 02-02-13.

## 2013-02-07 NOTE — Telephone Encounter (Signed)
LMVM for pt that will address with Dr. Pearlean Brownie this afternoon.

## 2013-02-08 NOTE — Telephone Encounter (Signed)
LMVM on mobile re: note ready for her GI MD, for being off plavix 5 days prior to her procedure.

## 2013-02-14 ENCOUNTER — Ambulatory Visit (HOSPITAL_COMMUNITY)
Admission: RE | Admit: 2013-02-14 | Discharge: 2013-02-14 | Disposition: A | Discharge status: Home / Self Care | Payer: Medicare Other | Source: Ambulatory Visit | Attending: Internal Medicine | Admitting: Internal Medicine

## 2013-02-14 ENCOUNTER — Encounter (HOSPITAL_COMMUNITY): Payer: Self-pay | Admitting: *Deleted

## 2013-02-14 ENCOUNTER — Encounter (HOSPITAL_COMMUNITY)
Admission: RE | Disposition: A | Discharge status: Home / Self Care | Payer: Self-pay | Source: Ambulatory Visit | Attending: Internal Medicine

## 2013-02-14 DIAGNOSIS — E119 Type 2 diabetes mellitus without complications: Secondary | ICD-10-CM | POA: Diagnosis not present

## 2013-02-14 DIAGNOSIS — Z794 Long term (current) use of insulin: Secondary | ICD-10-CM | POA: Diagnosis not present

## 2013-02-14 DIAGNOSIS — K2289 Other specified disease of esophagus: Secondary | ICD-10-CM | POA: Diagnosis not present

## 2013-02-14 DIAGNOSIS — K228 Other specified diseases of esophagus: Secondary | ICD-10-CM | POA: Insufficient documentation

## 2013-02-14 DIAGNOSIS — R1310 Dysphagia, unspecified: Secondary | ICD-10-CM | POA: Diagnosis not present

## 2013-02-14 DIAGNOSIS — R1319 Other dysphagia: Secondary | ICD-10-CM

## 2013-02-14 DIAGNOSIS — K449 Diaphragmatic hernia without obstruction or gangrene: Secondary | ICD-10-CM | POA: Diagnosis not present

## 2013-02-14 DIAGNOSIS — K2100 Gastro-esophageal reflux disease with esophagitis, without bleeding: Secondary | ICD-10-CM | POA: Diagnosis not present

## 2013-02-14 DIAGNOSIS — K222 Esophageal obstruction: Secondary | ICD-10-CM | POA: Insufficient documentation

## 2013-02-14 HISTORY — PX: ESOPHAGOGASTRODUODENOSCOPY (EGD) WITH ESOPHAGEAL DILATION: SHX5812

## 2013-02-14 SURGERY — ESOPHAGOGASTRODUODENOSCOPY (EGD) WITH ESOPHAGEAL DILATION
Anesthesia: Moderate Sedation

## 2013-02-14 MED ORDER — DEXLANSOPRAZOLE 60 MG PO CPDR: 60.0000 mg | DELAYED_RELEASE_CAPSULE | Freq: Every day | ORAL | Status: DC

## 2013-02-14 MED ORDER — STERILE WATER FOR IRRIGATION IR SOLN
Status: DC | PRN
  Administered 2013-02-14: 14:00:00

## 2013-02-14 MED ORDER — MEPERIDINE HCL 25 MG/ML IJ SOLN
INTRAMUSCULAR | Status: DC | PRN
  Administered 2013-02-14 (×2): 25 mg via INTRAVENOUS

## 2013-02-14 MED ORDER — SODIUM CHLORIDE 0.9 % IV SOLN
INTRAVENOUS | Status: DC
  Administered 2013-02-14: 14:00:00 via INTRAVENOUS

## 2013-02-14 MED ORDER — MEPERIDINE HCL 50 MG/ML IJ SOLN
INTRAMUSCULAR | Status: AC
  Filled 2013-02-14: qty 1

## 2013-02-14 MED ORDER — MIDAZOLAM HCL 5 MG/5ML IJ SOLN
INTRAMUSCULAR | Status: DC | PRN
  Administered 2013-02-14 (×4): 2 mg via INTRAVENOUS

## 2013-02-14 MED ORDER — MIDAZOLAM HCL 5 MG/5ML IJ SOLN
INTRAMUSCULAR | Status: AC
  Filled 2013-02-14: qty 10

## 2013-02-14 MED ORDER — BUTAMBEN-TETRACAINE-BENZOCAINE 2-2-14 % EX AERO
INHALATION_SPRAY | CUTANEOUS | Status: DC | PRN
  Administered 2013-02-14: 2 via TOPICAL

## 2013-02-14 NOTE — H&P (Signed)
Nancy Sutton is an 67 y.o. female.   Chief Complaint: Patient's here for EGD and ED. HPI: Patient is 67 year old Caucasian female who presents with a three-month history of intermittent solid food dysphagia. She points to her sternum is bolus flexion. Bullous eventually passes down. She has history of GERD. She was on PPI but switched to Zantac and she: Plavix because she had a CVA 18 months ago. She has good appetite. She denies weight loss abdominal pain or melena.  Past Medical History  Diagnosis Date  . Diabetes mellitus     since 2008  . Hypercholesteremia   . GERD (gastroesophageal reflux disease)   . 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 Arthritis   . Hyperlipidemia     Past Surgical History  Procedure Laterality Date  . Eye surgery  10-2010    left eye removal of cataract  . Tubal ligation  1984  . Cataract extraction w/phaco  11/01/2011    Procedure: CATARACT EXTRACTION PHACO AND INTRAOCULAR LENS PLACEMENT (IOC);  Surgeon: Nancy Payor, MD;  Location: [facility];  Service: Ophthalmology;  Laterality: Right;  CDE 12.48    Family History  Problem Relation Age of Onset  . Anesthesia problems Neg Hx   . Hypotension Neg Hx   . Malignant hyperthermia Neg Hx   . Pseudochol deficiency Neg Hx   . Cancer Mother   . Cancer Father    Social History:  reports that she has never smoked. She does not have any smokeless tobacco history on file. She reports that she does not drink alcohol or use illicit drugs.  Allergies:  Allergies  Allergen Reactions  . Ampicillin Other (See Comments)    Breaks out in bumps  . Bromday (Bromfenac Sodium) Itching and Swelling  . Tetanus Toxoids Other (See Comments)    Breaks out in bumps    Medications Prior to Admission  Medication Sig Dispense Refill  . calcium carbonate (TUMS - DOSED IN MG ELEMENTAL CALCIUM) 500 MG chewable tablet Chew 2 tablets by mouth at bedtime.      . clopidogrel (PLAVIX) 75 MG tablet Take 1 tablet (75 mg  total) by mouth daily.  30 tablet  6  . insulin aspart (NOVOLOG FLEXPEN) 100 UNIT/ML injection Inject 8 Units into the skin 3 (three) times daily before meals.       . insulin detemir (LEVEMIR FLEXPEN) 100 UNIT/ML injection Inject 24 Units into the skin at bedtime.       Marland Kitchen l-methylfolate-B6-B12 (METANX) 3-35-2 MG TABS Take 1 tablet by mouth 2 (two) times daily.      Marland Kitchen losartan (COZAAR) 25 MG tablet Take 25 mg by mouth daily.      . Multiple Vitamin (MULITIVITAMIN WITH MINERALS) TABS Take 1 tablet by mouth daily.      . Multiple Vitamins-Minerals (HAIR/SKIN/NAILS) TABS Take 1 tablet by mouth daily.      . ranitidine (ZANTAC) 300 MG tablet Take 300 mg by mouth daily.      . simvastatin (ZOCOR) 40 MG tablet Take 40 mg by mouth every evening.      . vitamin C (ASCORBIC ACID) 500 MG tablet Take 500 mg by mouth daily.      . vitamin E (VITAMIN E) 400 UNIT capsule Take 400 Units by mouth daily.        No results found for this or any previous visit (from the past 48 hour(s)). No results found.  ROS  Blood pressure 145/65, temperature 97.8 F (  36.6 C), temperature source Oral, resp. rate 20, height 4' 11.5" (1.511 m), weight 122 lb (55.339 kg), SpO2 97.00%. Physical Exam  Constitutional: She appears well-developed and well-nourished.  HENT:  Mouth/Throat: Oropharynx is clear and moist.  Eyes: Conjunctivae are normal. No scleral icterus.  Neck: No thyromegaly present.  Cardiovascular: Normal rate, regular rhythm and normal heart sounds.   No murmur heard. Respiratory: Effort normal and breath sounds normal.  GI: Soft. She exhibits no distension and no mass. There is no tenderness.  Musculoskeletal: She exhibits no edema.  Lymphadenopathy:    She has no cervical adenopathy.  Neurological: She is alert.  Skin: Skin is warm and dry.     Assessment/Plan Solid food dysphagia. Chronic GERD. EGD and ED.  Nancy Sutton U 02/14/2013, 2:27 PM

## 2013-02-14 NOTE — Op Note (Addendum)
EGD PROCEDURE REPORT  PATIENT:  Nancy Sutton  MR#:  [id number] Birthdate:  August 12, 1946, 67 y.o., female Endoscopist:  Dr. Malissa Hippo, MD Referred By:  Dr. Kirk Ruths, MD  Procedure Date: 02/14/2013  Procedure:   EGD with ED.  Indications:  Patient is 67 year old Caucasian female with history of GERD who presents with 2-3 month history of solid food dysphagia.            Informed Consent:  The risks, benefits, alternatives & imponderables which include, but are not limited to, bleeding, infection, perforation, drug reaction and potential missed lesion have been reviewed.  The potential for biopsy, lesion removal, esophageal dilation, etc. have also been discussed.  Questions have been answered.  All parties agreeable.  Please see history & physical in medical record for more information.  Medications:  Demerol 50 mg IV Versed 8 mg IV Cetacaine spray topically for oropharyngeal anesthesia  Description of procedure:  The endoscope was introduced through the mouth and advanced to the second portion of the duodenum without difficulty or limitations. The mucosal surfaces were surveyed very carefully during advancement of the scope and upon withdrawal.  Findings:  Esophagus:  Small polyps ablated via cold biopsy from proximal esophagus. It was located at 23 cm from the incisors. Few erosions noted in distal 3 cm of esophagus along with triangular ulcer at GE junction along with a stricture. GEJ:  32 cm Hiatus:  36 cm Stomach:  Stomach was empty and distended very well with insufflation. Folds in the proximal stomach were normal. Examination of mucosa and body, antrum, pyloric channel, angularis, fundus and cardia was normal. Duodenum:  Normal bulbar and post bulbar mucosa.  Therapeutic/Diagnostic Maneuvers Performed:   Stricture at GE junction was dilated with balloon dilator. Dilator was advanced with the scope and guidewire pushed into the gastric lumen. The dilator was  positioned across the stricture and insufflated to a dibutyl 15 mm, 16.5 and finally to 18 mm resulting in mucosal disruption. Balloon was withdrawn. I was able to pass the scope across the stricture without resistance.  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 This stricture was dilated with a balloon to 18 mm. Small sliding hiatal hernia. Small polyp there are cold biopsy from proximal esophagus possibly a papilloma.  Recommendations:  Discontinue Zantac Begin Dexlansoprazole 60 mg by mouth every morning. Patient will resume Plavix starting tomorrow evening. Office visit in 3 months.  [name]  02/14/2013  2:57 PM  CC: Dr. Kirk Ruths, MD & Dr. Bonnetta Barry ref. provider found

## 2013-02-15 ENCOUNTER — Telehealth: Payer: Self-pay | Admitting: *Deleted

## 2013-02-15 NOTE — Telephone Encounter (Signed)
Pt had 3 polyps removed recently and Dr. Gerilyn Nestle has recommended that she stop zantac and start taking Dexilant which would be ok with the plavix.  Cost is a factor, but also taking generic of something similar would be ok along with plavix.  Why too, is taking these type of meds for reflux contraindicated with plavix?  Please advise.

## 2013-02-15 NOTE — Telephone Encounter (Signed)
plavix effect is decreased in patients on those GI meds hence it is not as effective requiring the change

## 2013-02-16 ENCOUNTER — Telehealth (INDEPENDENT_AMBULATORY_CARE_PROVIDER_SITE_OTHER): Payer: Self-pay | Admitting: *Deleted

## 2013-02-16 NOTE — Telephone Encounter (Signed)
We rec'd a prior auth for the Dexilant 60 mg one by mouth daily I called [employer], this was approved until 09-19-13 [facility] called and made aware. They will contact the patient

## 2013-02-16 NOTE — Telephone Encounter (Signed)
I called pt and gave her the message below.   I told her I asked Dr. Pearlean Brownie re: what other less expensive meds would be possibilities and he said zantac, but I relayed she had polyps and needed something stronger.  Dexilant too expensive.  He deferred to Dr. Colbert Ewing' expertise in this matter.. She verbalized understanding. She can take the plavix in am or pm, just needs to be consistent.

## 2013-02-19 ENCOUNTER — Telehealth (INDEPENDENT_AMBULATORY_CARE_PROVIDER_SITE_OTHER): Payer: Self-pay | Admitting: *Deleted

## 2013-02-19 ENCOUNTER — Encounter (HOSPITAL_COMMUNITY): Payer: Self-pay | Admitting: Internal Medicine

## 2013-02-19 NOTE — Telephone Encounter (Signed)
Would like for the nurse to return her call at [phone]. She has a question about a medicine.

## 2013-02-21 NOTE — Telephone Encounter (Signed)
Patient was called at the number provided. Left a voice message asking that she call me back.

## 2013-02-23 ENCOUNTER — Other Ambulatory Visit (INDEPENDENT_AMBULATORY_CARE_PROVIDER_SITE_OTHER): Payer: Self-pay | Admitting: Internal Medicine

## 2013-02-23 MED ORDER — PANTOPRAZOLE SODIUM 40 MG PO TBEC: 40.0000 mg | DELAYED_RELEASE_TABLET | Freq: Every day | ORAL | Status: DC

## 2013-02-26 ENCOUNTER — Encounter (INDEPENDENT_AMBULATORY_CARE_PROVIDER_SITE_OTHER): Payer: Self-pay | Admitting: *Deleted

## 2013-04-25 DIAGNOSIS — IMO0002 Reserved for concepts with insufficient information to code with codable children: Secondary | ICD-10-CM | POA: Diagnosis not present

## 2013-04-25 DIAGNOSIS — E1065 Type 1 diabetes mellitus with hyperglycemia: Secondary | ICD-10-CM | POA: Diagnosis not present

## 2013-04-25 DIAGNOSIS — E559 Vitamin D deficiency, unspecified: Secondary | ICD-10-CM | POA: Diagnosis not present

## 2013-05-02 DIAGNOSIS — IMO0002 Reserved for concepts with insufficient information to code with codable children: Secondary | ICD-10-CM | POA: Diagnosis not present

## 2013-05-02 DIAGNOSIS — E785 Hyperlipidemia, unspecified: Secondary | ICD-10-CM | POA: Diagnosis not present

## 2013-05-02 DIAGNOSIS — E1065 Type 1 diabetes mellitus with hyperglycemia: Secondary | ICD-10-CM | POA: Diagnosis not present

## 2013-05-02 DIAGNOSIS — I6350 Cerebral infarction due to unspecified occlusion or stenosis of unspecified cerebral artery: Secondary | ICD-10-CM | POA: Diagnosis not present

## 2013-05-17 ENCOUNTER — Ambulatory Visit (INDEPENDENT_AMBULATORY_CARE_PROVIDER_SITE_OTHER): Payer: Medicare Other | Admitting: Internal Medicine

## 2013-05-17 ENCOUNTER — Encounter (INDEPENDENT_AMBULATORY_CARE_PROVIDER_SITE_OTHER): Payer: Self-pay | Admitting: Internal Medicine

## 2013-05-17 VITALS — BP 160/58 | HR 60 | Temp 97.9°F | Ht 59.0 in | Wt 125.7 lb

## 2013-05-17 DIAGNOSIS — K219 Gastro-esophageal reflux disease without esophagitis: Secondary | ICD-10-CM | POA: Diagnosis not present

## 2013-05-17 DIAGNOSIS — R1310 Dysphagia, unspecified: Secondary | ICD-10-CM | POA: Diagnosis not present

## 2013-05-17 NOTE — Progress Notes (Addendum)
Subjective:     Patient ID: Nancy Sutton, female   DOB: Feb 15, 1946, 67 y.o.   MRN: [id number]  HPI Nancy Sutton is a 67 yr old female here today for f/u after recently undergoing and EGD/ED for GERD and solid food dysphagia.  States she feels good. No acid reflux which is controlled with Protonix. She has no difficulty with dysphagia. She is eating what she wants. Her appetite is good. No weight loss. Her BMs are normal.  02/14/2013 EGD/ED: GERD and solid food dysphagia, Dr. 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 This stricture was dilated with a balloon to 18 mm.  Small sliding hiatal hernia.  Small polyp there are cold biopsy from proximal esophagus possibly a papilloma.      Review of Systems see hpi Current Outpatient Prescriptions  Medication Sig Dispense Refill  . clopidogrel (PLAVIX) 75 MG tablet Take 1 tablet (75 mg total) by mouth daily.  30 tablet  6  . insulin aspart (NOVOLOG FLEXPEN) 100 UNIT/ML injection Inject 8 Units into the skin 3 (three) times daily before meals.       . insulin detemir (LEVEMIR FLEXPEN) 100 UNIT/ML injection Inject 24 Units into the skin at bedtime.       Marland Kitchen l-methylfolate-B6-B12 (METANX) 3-35-2 MG TABS Take 1 tablet by mouth 2 (two) times daily.      Marland Kitchen losartan (COZAAR) 25 MG tablet Take 25 mg by mouth daily.      . Multiple Vitamin (MULITIVITAMIN WITH MINERALS) TABS Take 1 tablet by mouth daily.      . Multiple Vitamins-Minerals (HAIR/SKIN/NAILS) TABS Take 1 tablet by mouth daily.      . pantoprazole (PROTONIX) 40 MG tablet Take 1 tablet (40 mg total) by mouth daily.  30 tablet  11  . simvastatin (ZOCOR) 40 MG tablet Take 40 mg by mouth every evening.      . vitamin C (ASCORBIC ACID) 500 MG tablet Take 500 mg by mouth daily.      . vitamin E (VITAMIN E) 400 UNIT capsule Take 400 Units by mouth daily.       No current facility-administered medications for this visit.   Past Medical History  Diagnosis  Date  . Diabetes mellitus     since 2008  . Hypercholesteremia   . GERD (gastroesophageal reflux disease)   . 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 Arthritis   . Hyperlipidemia    Past Surgical History  Procedure Laterality Date  . Eye surgery  10-2010    left eye removal of cataract  . Tubal ligation  1984  . Cataract extraction w/phaco  11/01/2011    Procedure: CATARACT EXTRACTION PHACO AND INTRAOCULAR LENS PLACEMENT (IOC);  Surgeon: Gemma Payor, MD;  Location: [facility];  Service: Ophthalmology;  Laterality: Right;  CDE 12.48  . Esophagogastroduodenoscopy (egd) with esophageal dilation N/A 02/14/2013    Procedure: ESOPHAGOGASTRODUODENOSCOPY (EGD) WITH ESOPHAGEAL DILATION;  Surgeon: Malissa Hippo, MD;  Location: [facility];  Service: Endoscopy;  Laterality: N/A;  200-moved to 255 Ann to notify pt   Allergies  Allergen Reactions  . Ampicillin Other (See Comments)    Breaks out in bumps  . Bromday [Bromfenac Sodium] Itching and Swelling  . Tetanus Toxoids Other (See Comments)    Breaks out in bumps        Objective:   Physical Exam  Filed Vitals:   05/17/13 1045  BP: 160/58  Pulse: 60  Temp: 97.9  F (36.6 C)  Height: 4\' 11"  (1.499 m)  Weight: 125 lb 11.2 oz (57.017 kg)   Alert and oriented. Skin warm and dry. Oral mucosa is moist.   . Sclera anicteric, conjunctivae is pink. Thyroid not enlarged. No cervical lymphadenopathy. Lungs clear. Heart regular rate and rhythm.  Abdomen is soft. Bowel sounds are positive. No hepatomegaly. No abdominal masses felt. No tenderness.  No edema to lower extremities.  .     Assessment:    Erosive esophagitis. Dysphagia. Patient is asymptomatic at this time. Acid reflux controlled with Protonix.  She is not having difficulty swallowing. She tells me she can eat anything she wants now.     Plan:   Continue Protonix. Avoid spicy foods. OV in 1 yr. If any problems, please call our office.

## 2013-05-17 NOTE — Patient Instructions (Addendum)
Continue the Protonix. Avoid spicy foods.

## 2013-05-28 DIAGNOSIS — Z961 Presence of intraocular lens: Secondary | ICD-10-CM | POA: Diagnosis not present

## 2013-05-28 DIAGNOSIS — H40019 Open angle with borderline findings, low risk, unspecified eye: Secondary | ICD-10-CM | POA: Diagnosis not present

## 2013-06-18 ENCOUNTER — Ambulatory Visit: Payer: Medicare Other | Admitting: Neurology

## 2013-06-21 ENCOUNTER — Encounter: Payer: Self-pay | Admitting: Neurology

## 2013-06-21 ENCOUNTER — Ambulatory Visit (INDEPENDENT_AMBULATORY_CARE_PROVIDER_SITE_OTHER): Payer: Medicare Other | Admitting: Neurology

## 2013-06-21 VITALS — BP 138/65 | HR 88 | Temp 98.1°F | Ht 59.5 in | Wt 126.0 lb

## 2013-06-21 DIAGNOSIS — H53469 Homonymous bilateral field defects, unspecified side: Secondary | ICD-10-CM | POA: Diagnosis not present

## 2013-06-21 DIAGNOSIS — H53462 Homonymous bilateral field defects, left side: Secondary | ICD-10-CM | POA: Insufficient documentation

## 2013-06-21 NOTE — Patient Instructions (Addendum)
Continue Plavix for stroke prevention with strict control of hypertension with blood pressure goal below 120/80, lipids with LDL cholesterol goal below 70 mg percent and diabetes with hemoglobin A1c goal below 6.5%. Check followup carotid Dopplers. Return for followup in a year with Nancy Fife, NP or call earlier if necessary.

## 2013-06-21 NOTE — Progress Notes (Signed)
HPI: Ms. 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  She returns for followup of the last visit on 06/14/12. She states she'll doing well she's had no recurrent stroke or TIA symptoms. She is tolerating plavix without significant bleeding but does complain of mild bruising. She states her sugars have all been under good control. She had profile checked by her primary physician a few months ago and was apparently unremarkable though I do not have those results. She states her blood pressure is also under good control. She's continues to have peripheral vision loss in the left and has not been driving. She had a brief episode of dizziness and blurred vision 2 weeks ago which lasted couple of days. She was seen at [facility] emergency room where CT head showed no acute abnormalities. She denied any accompanying headache or focal neurological symptoms. She was seen by ophthalmologist and diagnosed with glaucoma and plans treatment of that stone. Update 06/21/13 She returns for followup after last visit on 12/14/12. She continues to do well without recurrent stroke or TIA symptoms. She continues to have peripheral vision loss in the left and is some difficulty with reading and walking because of that. She remains on Plavix and is tolerating it well without significant bleeding or bruising. She states her blood pressure is well controlled and usually is and 120s but it was elevated today in our office at 135/65. Her fasting sugars range in the 120s to 130s range. She states her last hemoglobin A1c was 6.8 when checked to 3 months ago. She also had carotid ultrasound done last in November 2012  by her primary physician. She is significantly bothered by  chronic right knee pain for which she has seen Dr.Alusio but she has been reluctant 1 only  replacement. ROS: 14 system review of systems is positive only for blurred vision, loss of vision and gait difficulty. Physical Exam General: well developed, well nourished, seated, in no evident distress Head: head normocephalic and atraumatic. Orohparynx benign Neck: supple with no carotid or supraclavicular bruits Cardiovascular: regular rate and rhythm, no murmurs Musculoskeletal slight deformity fingers from osteoarthritis bilaterally  Neurologic Exam Mental Status: Awake and fully alert. Oriented to place and time. Recent and remote memory intact. Attention span, concentration and fund of knowledge appropriate. Mood and affect appropriate.  Cranial Nerves: Fundoscopic exam reveals sharp disc margins. Pupils equal, briskly reactive to light. Extraocular movements full without nystagmus. Visual fields show dense left homonymous hemianopia to confrontation. Hearing intact and s. Facial sensation intact. Face, tongue, palate move normally and symmetrically. Neck flexion and extension normal.  Motor: Normal bulk and tone. Normal strength in all tested extremity muscles. Sensory.: intact to touch and pinprick and vibratory sensation.  Coordination: Rapid alternating movements normal in all extremities. Finger-to-nose and heel-to-shin performed accurately bilaterally. Gait and Station: Arises from chair without difficulty. Stance is normal. Gait demonstrates normal stride length and balance but favors right knee due to pain . Able to heel, toe and tandem walk with mild difficulty.  Reflexes: 1+ and symmetric. Toes downgoing.     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 Vascular risk factors of diabetes, hyperlipidemia and interpreted atherosclerosis. New episode of transient dizziness and blurred vision 2 weeks or flow unlikely to be a TIA   PLAN: Continue Plavix for  stroke  prevention with strict control of hypertension with blood pressure goal below 120/80, lipids with LDL cholesterol goal below 70 mg percent and diabetes with hemoglobin A1c goal below 6.5%. Check followup carotid Dopplers. Return for followup in a year with Larita Fife, NP or call earlier if necessary.

## 2013-07-04 ENCOUNTER — Other Ambulatory Visit: Payer: Medicare Other

## 2013-07-09 DIAGNOSIS — H4011X0 Primary open-angle glaucoma, stage unspecified: Secondary | ICD-10-CM | POA: Diagnosis not present

## 2013-07-09 DIAGNOSIS — Z961 Presence of intraocular lens: Secondary | ICD-10-CM | POA: Diagnosis not present

## 2013-07-09 DIAGNOSIS — H53469 Homonymous bilateral field defects, unspecified side: Secondary | ICD-10-CM | POA: Diagnosis not present

## 2013-07-09 DIAGNOSIS — H409 Unspecified glaucoma: Secondary | ICD-10-CM | POA: Diagnosis not present

## 2013-07-26 DIAGNOSIS — E1065 Type 1 diabetes mellitus with hyperglycemia: Secondary | ICD-10-CM | POA: Diagnosis not present

## 2013-07-26 DIAGNOSIS — IMO0002 Reserved for concepts with insufficient information to code with codable children: Secondary | ICD-10-CM | POA: Diagnosis not present

## 2013-07-26 DIAGNOSIS — I6350 Cerebral infarction due to unspecified occlusion or stenosis of unspecified cerebral artery: Secondary | ICD-10-CM | POA: Diagnosis not present

## 2013-08-02 DIAGNOSIS — IMO0002 Reserved for concepts with insufficient information to code with codable children: Secondary | ICD-10-CM | POA: Diagnosis not present

## 2013-08-02 DIAGNOSIS — I10 Essential (primary) hypertension: Secondary | ICD-10-CM | POA: Diagnosis not present

## 2013-08-02 DIAGNOSIS — E785 Hyperlipidemia, unspecified: Secondary | ICD-10-CM | POA: Diagnosis not present

## 2013-08-02 DIAGNOSIS — E1065 Type 1 diabetes mellitus with hyperglycemia: Secondary | ICD-10-CM | POA: Diagnosis not present

## 2013-08-06 DIAGNOSIS — H409 Unspecified glaucoma: Secondary | ICD-10-CM | POA: Diagnosis not present

## 2013-08-06 DIAGNOSIS — H4011X0 Primary open-angle glaucoma, stage unspecified: Secondary | ICD-10-CM | POA: Diagnosis not present

## 2013-08-14 ENCOUNTER — Ambulatory Visit (INDEPENDENT_AMBULATORY_CARE_PROVIDER_SITE_OTHER): Payer: Medicare Other

## 2013-08-14 DIAGNOSIS — I6350 Cerebral infarction due to unspecified occlusion or stenosis of unspecified cerebral artery: Secondary | ICD-10-CM | POA: Diagnosis not present

## 2013-08-14 DIAGNOSIS — H53462 Homonymous bilateral field defects, left side: Secondary | ICD-10-CM

## 2013-08-24 ENCOUNTER — Other Ambulatory Visit: Payer: Self-pay | Admitting: Neurology

## 2013-09-15 DIAGNOSIS — E1129 Type 2 diabetes mellitus with other diabetic kidney complication: Secondary | ICD-10-CM | POA: Diagnosis not present

## 2013-09-15 DIAGNOSIS — Z6825 Body mass index (BMI) 25.0-25.9, adult: Secondary | ICD-10-CM | POA: Diagnosis not present

## 2013-09-15 DIAGNOSIS — J019 Acute sinusitis, unspecified: Secondary | ICD-10-CM | POA: Diagnosis not present

## 2013-09-15 DIAGNOSIS — N182 Chronic kidney disease, stage 2 (mild): Secondary | ICD-10-CM | POA: Diagnosis not present

## 2013-09-21 DIAGNOSIS — H40019 Open angle with borderline findings, low risk, unspecified eye: Secondary | ICD-10-CM | POA: Diagnosis not present

## 2013-10-01 DIAGNOSIS — H26499 Other secondary cataract, unspecified eye: Secondary | ICD-10-CM | POA: Diagnosis not present

## 2013-11-05 DIAGNOSIS — E785 Hyperlipidemia, unspecified: Secondary | ICD-10-CM | POA: Diagnosis not present

## 2013-11-05 DIAGNOSIS — E1065 Type 1 diabetes mellitus with hyperglycemia: Secondary | ICD-10-CM | POA: Diagnosis not present

## 2013-11-05 DIAGNOSIS — IMO0002 Reserved for concepts with insufficient information to code with codable children: Secondary | ICD-10-CM | POA: Diagnosis not present

## 2013-11-05 DIAGNOSIS — I10 Essential (primary) hypertension: Secondary | ICD-10-CM | POA: Diagnosis not present

## 2014-02-04 DIAGNOSIS — E785 Hyperlipidemia, unspecified: Secondary | ICD-10-CM | POA: Diagnosis not present

## 2014-02-04 DIAGNOSIS — I10 Essential (primary) hypertension: Secondary | ICD-10-CM | POA: Diagnosis not present

## 2014-02-04 DIAGNOSIS — E1065 Type 1 diabetes mellitus with hyperglycemia: Secondary | ICD-10-CM | POA: Diagnosis not present

## 2014-02-04 DIAGNOSIS — IMO0002 Reserved for concepts with insufficient information to code with codable children: Secondary | ICD-10-CM | POA: Diagnosis not present

## 2014-02-13 ENCOUNTER — Encounter (INDEPENDENT_AMBULATORY_CARE_PROVIDER_SITE_OTHER): Payer: Self-pay | Admitting: *Deleted

## 2014-02-13 ENCOUNTER — Encounter (HOSPITAL_COMMUNITY): Payer: Self-pay | Admitting: Emergency Medicine

## 2014-02-13 ENCOUNTER — Emergency Department (HOSPITAL_COMMUNITY)
Admission: EM | Admit: 2014-02-13 | Discharge: 2014-02-13 | Disposition: A | Discharge status: Home / Self Care | Payer: Medicare Other | Attending: Emergency Medicine | Admitting: Emergency Medicine

## 2014-02-13 DIAGNOSIS — Y929 Unspecified place or not applicable: Secondary | ICD-10-CM | POA: Insufficient documentation

## 2014-02-13 DIAGNOSIS — Z794 Long term (current) use of insulin: Secondary | ICD-10-CM | POA: Insufficient documentation

## 2014-02-13 DIAGNOSIS — Z7902 Long term (current) use of antithrombotics/antiplatelets: Secondary | ICD-10-CM | POA: Diagnosis not present

## 2014-02-13 DIAGNOSIS — IMO0001 Reserved for inherently not codable concepts without codable children: Secondary | ICD-10-CM | POA: Insufficient documentation

## 2014-02-13 DIAGNOSIS — E7800 Pure hypercholesterolemia, unspecified: Secondary | ICD-10-CM | POA: Insufficient documentation

## 2014-02-13 DIAGNOSIS — S61209A Unspecified open wound of unspecified finger without damage to nail, initial encounter: Secondary | ICD-10-CM | POA: Insufficient documentation

## 2014-02-13 DIAGNOSIS — E119 Type 2 diabetes mellitus without complications: Secondary | ICD-10-CM | POA: Insufficient documentation

## 2014-02-13 DIAGNOSIS — S61409A Unspecified open wound of unspecified hand, initial encounter: Secondary | ICD-10-CM | POA: Diagnosis not present

## 2014-02-13 DIAGNOSIS — Y939 Activity, unspecified: Secondary | ICD-10-CM | POA: Insufficient documentation

## 2014-02-13 DIAGNOSIS — E785 Hyperlipidemia, unspecified: Secondary | ICD-10-CM | POA: Diagnosis not present

## 2014-02-13 DIAGNOSIS — A280 Pasteurellosis: Secondary | ICD-10-CM | POA: Diagnosis not present

## 2014-02-13 DIAGNOSIS — Z79899 Other long term (current) drug therapy: Secondary | ICD-10-CM | POA: Diagnosis not present

## 2014-02-13 DIAGNOSIS — S61459A Open bite of unspecified hand, initial encounter: Secondary | ICD-10-CM

## 2014-02-13 DIAGNOSIS — Z8739 Personal history of other diseases of the musculoskeletal system and connective tissue: Secondary | ICD-10-CM | POA: Insufficient documentation

## 2014-02-13 DIAGNOSIS — W5501XA Bitten by cat, initial encounter: Secondary | ICD-10-CM

## 2014-02-13 DIAGNOSIS — K219 Gastro-esophageal reflux disease without esophagitis: Secondary | ICD-10-CM | POA: Insufficient documentation

## 2014-02-13 DIAGNOSIS — Z23 Encounter for immunization: Secondary | ICD-10-CM | POA: Insufficient documentation

## 2014-02-13 DIAGNOSIS — Z6826 Body mass index (BMI) 26.0-26.9, adult: Secondary | ICD-10-CM | POA: Diagnosis not present

## 2014-02-13 DIAGNOSIS — Z8673 Personal history of transient ischemic attack (TIA), and cerebral infarction without residual deficits: Secondary | ICD-10-CM | POA: Diagnosis not present

## 2014-02-13 LAB — BASIC METABOLIC PANEL
BUN: 24 mg/dL — AB (ref 6–23)
CHLORIDE: 99 meq/L (ref 96–112)
CO2: 24 mEq/L (ref 19–32)
Calcium: 9.9 mg/dL (ref 8.4–10.5)
Creatinine, Ser: 0.67 mg/dL (ref 0.50–1.10)
GFR, EST NON AFRICAN AMERICAN: 88 mL/min — AB (ref >90)
Glucose, Bld: 201 mg/dL — ABNORMAL HIGH (ref 70–99)
POTASSIUM: 4.2 meq/L (ref 3.7–5.3)
SODIUM: 139 meq/L (ref 137–147)

## 2014-02-13 LAB — CBC WITH DIFFERENTIAL/PLATELET
BASOS PCT: 0 % (ref 0–1)
Basophils Absolute: 0 10*3/uL (ref 0.0–0.1)
EOS ABS: 0.1 10*3/uL (ref 0.0–0.7)
Eosinophils Relative: 1 % (ref 0–5)
HCT: 33.4 % — ABNORMAL LOW (ref 36.0–46.0)
HEMOGLOBIN: 11.3 g/dL — AB (ref 12.0–15.0)
LYMPHS ABS: 1.8 10*3/uL (ref 0.7–4.0)
Lymphocytes Relative: 17 % (ref 12–46)
MCH: 30.1 pg (ref 26.0–34.0)
MCHC: 33.8 g/dL (ref 30.0–36.0)
MCV: 88.8 fL (ref 78.0–100.0)
MONOS PCT: 8 % (ref 3–12)
Monocytes Absolute: 0.9 10*3/uL (ref 0.1–1.0)
NEUTROS ABS: 8 10*3/uL — AB (ref 1.7–7.7)
NEUTROS PCT: 74 % (ref 43–77)
PLATELETS: 225 10*3/uL (ref 150–400)
RBC: 3.76 MIL/uL — AB (ref 3.87–5.11)
RDW: 13.7 % (ref 11.5–15.5)
WBC: 10.9 10*3/uL — ABNORMAL HIGH (ref 4.0–10.5)

## 2014-02-13 MED ORDER — CIPROFLOXACIN IN D5W 400 MG/200ML IV SOLN
400.0000 mg | Freq: Once | INTRAVENOUS | Status: AC
  Administered 2014-02-13: 400 mg via INTRAVENOUS
  Filled 2014-02-13: qty 200

## 2014-02-13 MED ORDER — DOXYCYCLINE HYCLATE 100 MG PO CAPS: 100.0000 mg | ORAL_CAPSULE | Freq: Two times a day (BID) | ORAL | Status: DC

## 2014-02-13 MED ORDER — METRONIDAZOLE IN NACL 5-0.79 MG/ML-% IV SOLN
500.0000 mg | Freq: Once | INTRAVENOUS | Status: AC
  Administered 2014-02-13: 500 mg via INTRAVENOUS
  Filled 2014-02-13: qty 100

## 2014-02-13 MED ORDER — RABIES IMMUNE GLOBULIN 150 UNIT/ML IM INJ
20.0000 [IU]/kg | INJECTION | Freq: Once | INTRAMUSCULAR | Status: AC
  Administered 2014-02-13: 1200 [IU] via INTRAMUSCULAR
  Filled 2014-02-13: qty 8

## 2014-02-13 MED ORDER — RABIES VACCINE, PCEC IM SUSR
1.0000 mL | Freq: Once | INTRAMUSCULAR | Status: AC
  Administered 2014-02-13: 1 mL via INTRAMUSCULAR
  Filled 2014-02-13: qty 1

## 2014-02-13 MED ORDER — METRONIDAZOLE 500 MG PO TABS: 500.0000 mg | ORAL_TABLET | Freq: Two times a day (BID) | ORAL | Status: DC

## 2014-02-13 MED ORDER — SODIUM CHLORIDE 0.9 % IV BOLUS (SEPSIS)
1000.0000 mL | INTRAVENOUS | Status: AC
  Administered 2014-02-13: 1000 mL via INTRAVENOUS

## 2014-02-13 NOTE — ED Provider Notes (Signed)
CSN: [id number]     Arrival date & time 02/13/14  1034 History  This chart was scribed for non-physician practitioner Nancy Beck, PA-C working with Dagmar Hait, MD by Dorothey Baseman, ED Scribe. This patient was seen in room TR09C/TR09C and the patient's care was started at 11:41 AM.    Chief Complaint  Patient presents with  . Animal Bite  . Hand Injury   Patient is a 68 y.o. female presenting with animal bite. The history is provided by the patient. No language interpreter was used.  Animal Bite Contact animal:  Cat Location:  Hand Hand injury location:  R fingers and L fingers Pain details:    Severity:  Mild   Timing:  Constant   Progression:  Unchanged Incident location:  Home Provoked: unprovoked   Notifications:  Animal control Animal's rabies vaccination status:  Never received Animal in possession: yes   Relieved by:  None tried Worsened by:  Nothing tried Ineffective treatments:  None tried Associated symptoms: swelling    HPI Comments: Nancy Sutton is a 68 y.o. Female with a history of DM who presents to the Emergency Department complaining of a cat bite to the bilateral hands that occurred yesterday morning. Patient reports an associated, constant, mild pain with associated swelling to the areas around the wounds. Patient reports that she was seen by Dr. Loreta Ave at [facility] earlier today for these complaints and she received a prescription for antibiotics, but was advised to come here. She reports that the cat just lives around her neighborhood and she does not believe it has had any of its vaccinations. She reports that animal control has been notified and has the cat in custody. Patient also has a history of hyperlipidemia, hypercholesterolemia, and stroke.   Past Medical History  Diagnosis Date  . Diabetes mellitus     since 2008  . Hypercholesteremia   . GERD (gastroesophageal reflux disease)   . Stroke 03/2011    only deficits-affected eyesight   . Arthritis   . Hyperlipidemia    Past Surgical History  Procedure Laterality Date  . Eye surgery  10-2010    left eye removal of cataract  . Tubal ligation  1984  . Cataract extraction w/phaco  11/01/2011    Procedure: CATARACT EXTRACTION PHACO AND INTRAOCULAR LENS PLACEMENT (IOC);  Surgeon: Gemma Payor, MD;  Location: [facility];  Service: Ophthalmology;  Laterality: Right;  CDE 12.48  . Esophagogastroduodenoscopy (egd) with esophageal dilation N/A 02/14/2013    Procedure: ESOPHAGOGASTRODUODENOSCOPY (EGD) WITH ESOPHAGEAL DILATION;  Surgeon: Malissa Hippo, MD;  Location: [facility];  Service: Endoscopy;  Laterality: N/A;  200-moved to 255 Ann to notify pt   Family History  Problem Relation Age of Onset  . Anesthesia problems Neg Hx   . Hypotension Neg Hx   . Malignant hyperthermia Neg Hx   . Pseudochol deficiency Neg Hx   . Cancer Mother   . Cancer Father    History  Substance Use Topics  . Smoking status: Never Smoker   . Smokeless tobacco: Not on file  . Alcohol Use: No   OB History   Grav Para Term Preterm Abortions TAB SAB Ect Mult Living                 Review of Systems  Musculoskeletal: Positive for arthralgias and joint swelling.  Skin: Positive for wound.  All other systems reviewed and are negative.  Allergies  Ampicillin; Bromday; and Tetanus toxoids  Home Medications  Prior to Admission medications   Medication Sig Start Date End Date Taking? Authorizing Provider  clopidogrel (PLAVIX) 75 MG tablet TAKE ONE TABLET BY MOUTH ONCE DAILY 08/24/13   Micki Riley, MD  insulin aspart (NOVOLOG FLEXPEN) 100 UNIT/ML injection Inject 8 Units into the skin 3 (three) times daily before meals.     Historical Provider, MD  insulin detemir (LEVEMIR FLEXPEN) 100 UNIT/ML injection Inject 24 Units into the skin at bedtime.     Historical Provider, MD  l-methylfolate-B6-B12 (METANX) 3-35-2 MG TABS Take 1 tablet by mouth 2 (two) times daily.    Historical Provider, MD   losartan (COZAAR) 25 MG tablet Take 25 mg by mouth daily.    Historical Provider, MD  Multiple Vitamin (MULITIVITAMIN WITH MINERALS) TABS Take 1 tablet by mouth daily.    Historical Provider, MD  Multiple Vitamins-Minerals (HAIR/SKIN/NAILS) TABS Take 1 tablet by mouth daily.    Historical Provider, MD  pantoprazole (PROTONIX) 40 MG tablet Take 1 tablet (40 mg total) by mouth daily. 02/23/13   Malissa Hippo, MD  simvastatin (ZOCOR) 40 MG tablet Take 40 mg by mouth every evening.    Historical Provider, MD  vitamin C (ASCORBIC ACID) 500 MG tablet Take 500 mg by mouth daily.    Historical Provider, MD  vitamin E (VITAMIN E) 400 UNIT capsule Take 400 Units by mouth daily.    Historical Provider, MD   Triage Vitals: BP 156/51  Pulse 87  Temp(Src) 97.6 F (36.4 C) (Oral)  Resp 16  Ht 4\' 11"  (1.499 m)  Wt 133 lb (60.328 kg)  BMI 26.85 kg/m2  SpO2 98%  Physical Exam  Nursing note and vitals reviewed. Constitutional: She is oriented to person, place, and time. She appears well-developed and well-nourished. No distress.  HENT:  Head: Normocephalic and atraumatic.  Eyes: Conjunctivae are normal.  Neck: Normal range of motion. Neck supple.  Pulmonary/Chest: Effort normal. No respiratory distress.  Abdominal: She exhibits no distension.  Musculoskeletal: Normal range of motion. She exhibits edema and tenderness.  Neurological: She is alert and oriented to person, place, and time.  Skin: Skin is warm and dry. There is erythema.  Erythema noted of the left thumb and thenar area that extends proximal to her left wrist. Area is edematous and tender to palpate. Puncture wound noted to the bilateral thumbs.   Psychiatric: She has a normal mood and affect. Her behavior is normal.    ED Course  Procedures (including critical care time)  DIAGNOSTIC STUDIES: Oxygen Saturation is 98% on room air, normal by my interpretation.    COORDINATION OF CARE: 11:47 AM- Will order IV fluids, ciprofloxacin,  Flagyl, and rabies vaccinations. Ordered CBC, BMP. Advised patient to follow up with the referred hand surgeon. Discussed treatment plan with patient at bedside and patient verbalized agreement.    Labs Review Labs Reviewed  CBC WITH DIFFERENTIAL - Abnormal; Notable for the following:    WBC 10.9 (*)    RBC 3.76 (*)    Hemoglobin 11.3 (*)    HCT 33.4 (*)    Neutro Abs 8.0 (*)    All other components within normal limits  BASIC METABOLIC PANEL - Abnormal; Notable for the following:    Glucose, Bld 201 (*)    BUN 24 (*)    GFR calc non Af Amer 88 (*)    All other components within normal limits    Imaging Review No results found.   EKG Interpretation None  MDM   Final diagnoses:  Cat bite of hand    Patient's labs show mild elevation of WBCs. Patient given IV metronidazole and cipro for infection. Patient will be discharged with doxycycline and metronidazole. Patient advised to return to the ED with worsening or concerning symptoms. Patient advised to follow up with Dr. Mina MarbleWeingold for further evaluation if symptoms do not resolve.   I personally performed the services described in this documentation, which was scribed in my presence. The recorded information has been reviewed and is accurate.     Nancy BeckKaitlyn Sylvan Sookdeo, PA-C 02/13/14 1510

## 2014-02-13 NOTE — Discharge Instructions (Signed)
Take Doxycycline and Metronidazole as directed until gone. Refer to attached documents for more information. Follow up with your doctor for further evaluation. Follow up with Dr. Mina Marble with worsening or concerning symptoms.

## 2014-02-13 NOTE — ED Notes (Signed)
Pt's hand bite yesterday morning; Dr. Loreta Ave at [facility] sent her over; denies fevers.

## 2014-02-13 NOTE — ED Provider Notes (Signed)
Medical screening examination/treatment/procedure(s) were performed by non-physician practitioner and as supervising physician I was immediately available for consultation/collaboration.   EKG Interpretation None        William Para Cossey, MD 02/13/14 1549 

## 2014-02-14 DIAGNOSIS — S61409A Unspecified open wound of unspecified hand, initial encounter: Secondary | ICD-10-CM | POA: Diagnosis not present

## 2014-02-16 ENCOUNTER — Emergency Department (INDEPENDENT_AMBULATORY_CARE_PROVIDER_SITE_OTHER)
Admission: EM | Admit: 2014-02-16 | Discharge: 2014-02-16 | Disposition: A | Discharge status: Home / Self Care | Payer: Medicare Other | Source: Home / Self Care

## 2014-02-16 ENCOUNTER — Encounter (HOSPITAL_COMMUNITY): Payer: Self-pay | Admitting: Emergency Medicine

## 2014-02-16 DIAGNOSIS — Z203 Contact with and (suspected) exposure to rabies: Secondary | ICD-10-CM | POA: Diagnosis not present

## 2014-02-16 DIAGNOSIS — S61209A Unspecified open wound of unspecified finger without damage to nail, initial encounter: Secondary | ICD-10-CM | POA: Diagnosis not present

## 2014-02-16 MED ORDER — RABIES VACCINE, PCEC IM SUSR
1.0000 mL | Freq: Once | INTRAMUSCULAR | Status: AC
  Administered 2014-02-16: 1 mL via INTRAMUSCULAR

## 2014-02-16 MED ORDER — RABIES VACCINE, PCEC IM SUSR
INTRAMUSCULAR | Status: AC
  Filled 2014-02-16: qty 1

## 2014-02-16 NOTE — ED Notes (Signed)
Pt here for rabies injection. Voices no concerns at this time.  

## 2014-02-19 ENCOUNTER — Encounter: Payer: Self-pay | Admitting: Internal Medicine

## 2014-02-19 DIAGNOSIS — S61409A Unspecified open wound of unspecified hand, initial encounter: Secondary | ICD-10-CM | POA: Diagnosis not present

## 2014-02-20 ENCOUNTER — Encounter (HOSPITAL_COMMUNITY): Payer: Self-pay | Admitting: Emergency Medicine

## 2014-02-20 ENCOUNTER — Emergency Department (INDEPENDENT_AMBULATORY_CARE_PROVIDER_SITE_OTHER)
Admission: EM | Admit: 2014-02-20 | Discharge: 2014-02-20 | Disposition: A | Discharge status: Home / Self Care | Payer: Medicare Other | Source: Home / Self Care

## 2014-02-20 DIAGNOSIS — S61209A Unspecified open wound of unspecified finger without damage to nail, initial encounter: Secondary | ICD-10-CM | POA: Diagnosis not present

## 2014-02-20 DIAGNOSIS — Z203 Contact with and (suspected) exposure to rabies: Secondary | ICD-10-CM | POA: Diagnosis not present

## 2014-02-20 MED ORDER — RABIES VACCINE, PCEC IM SUSR
1.0000 mL | Freq: Once | INTRAMUSCULAR | Status: AC
  Administered 2014-02-20: 1 mL via INTRAMUSCULAR

## 2014-02-20 MED ORDER — RABIES VACCINE, PCEC IM SUSR
INTRAMUSCULAR | Status: AC
  Filled 2014-02-20: qty 1

## 2014-02-20 NOTE — ED Notes (Signed)
Pt  Here  For  Next  Rabies  Shot       states  Wound  Doing  Better

## 2014-02-20 NOTE — Discharge Instructions (Signed)
Return as  Directed  For  Next  Injection  Sooner if  Any  Problems

## 2014-02-21 ENCOUNTER — Other Ambulatory Visit (INDEPENDENT_AMBULATORY_CARE_PROVIDER_SITE_OTHER): Payer: Self-pay | Admitting: Internal Medicine

## 2014-02-21 NOTE — Telephone Encounter (Signed)
Per Dr.Rehman may fill with 5 additional refills. 

## 2014-02-27 ENCOUNTER — Encounter (HOSPITAL_COMMUNITY): Payer: Self-pay | Admitting: Emergency Medicine

## 2014-02-27 ENCOUNTER — Emergency Department (INDEPENDENT_AMBULATORY_CARE_PROVIDER_SITE_OTHER)
Admission: EM | Admit: 2014-02-27 | Discharge: 2014-02-27 | Disposition: A | Discharge status: Home / Self Care | Payer: Medicare Other | Source: Home / Self Care

## 2014-02-27 DIAGNOSIS — Z203 Contact with and (suspected) exposure to rabies: Secondary | ICD-10-CM

## 2014-02-27 DIAGNOSIS — IMO0001 Reserved for inherently not codable concepts without codable children: Secondary | ICD-10-CM

## 2014-02-27 DIAGNOSIS — S61209A Unspecified open wound of unspecified finger without damage to nail, initial encounter: Secondary | ICD-10-CM | POA: Diagnosis not present

## 2014-02-27 MED ORDER — RABIES VACCINE, PCEC IM SUSR
INTRAMUSCULAR | Status: AC
  Filled 2014-02-27: qty 1

## 2014-02-27 MED ORDER — RABIES VACCINE, PCEC IM SUSR
1.0000 mL | Freq: Once | INTRAMUSCULAR | Status: AC
  Administered 2014-02-27: 1 mL via INTRAMUSCULAR

## 2014-02-27 NOTE — ED Notes (Signed)
Pt  Is  Here   For  Next  In  Series  Of  Rabies  Shots

## 2014-02-27 NOTE — Discharge Instructions (Signed)
Congratulations  You  Are  [employer]

## 2014-04-23 ENCOUNTER — Encounter: Payer: Self-pay | Admitting: Neurology

## 2014-04-23 ENCOUNTER — Telehealth: Payer: Self-pay | Admitting: Neurology

## 2014-04-23 NOTE — Telephone Encounter (Signed)
Spoke to patient regarding rescheduling 09/24/14 appointment per Dr. Marlis EdelsonSethi's schedule, patient verbalized understanding. Printed and mailed letter with new appointment time.

## 2014-05-01 DIAGNOSIS — H40019 Open angle with borderline findings, low risk, unspecified eye: Secondary | ICD-10-CM | POA: Diagnosis not present

## 2014-05-09 DIAGNOSIS — E785 Hyperlipidemia, unspecified: Secondary | ICD-10-CM | POA: Diagnosis not present

## 2014-05-09 DIAGNOSIS — IMO0002 Reserved for concepts with insufficient information to code with codable children: Secondary | ICD-10-CM | POA: Diagnosis not present

## 2014-05-09 DIAGNOSIS — E1065 Type 1 diabetes mellitus with hyperglycemia: Secondary | ICD-10-CM | POA: Diagnosis not present

## 2014-05-09 DIAGNOSIS — I10 Essential (primary) hypertension: Secondary | ICD-10-CM | POA: Diagnosis not present

## 2014-05-16 DIAGNOSIS — IMO0002 Reserved for concepts with insufficient information to code with codable children: Secondary | ICD-10-CM | POA: Diagnosis not present

## 2014-05-16 DIAGNOSIS — I6350 Cerebral infarction due to unspecified occlusion or stenosis of unspecified cerebral artery: Secondary | ICD-10-CM | POA: Diagnosis not present

## 2014-05-16 DIAGNOSIS — E785 Hyperlipidemia, unspecified: Secondary | ICD-10-CM | POA: Diagnosis not present

## 2014-05-16 DIAGNOSIS — E1065 Type 1 diabetes mellitus with hyperglycemia: Secondary | ICD-10-CM | POA: Diagnosis not present

## 2014-05-16 DIAGNOSIS — I10 Essential (primary) hypertension: Secondary | ICD-10-CM | POA: Diagnosis not present

## 2014-06-17 ENCOUNTER — Encounter (INDEPENDENT_AMBULATORY_CARE_PROVIDER_SITE_OTHER): Payer: Self-pay | Admitting: Internal Medicine

## 2014-06-17 ENCOUNTER — Ambulatory Visit (INDEPENDENT_AMBULATORY_CARE_PROVIDER_SITE_OTHER): Payer: Medicare Other | Admitting: Internal Medicine

## 2014-06-17 VITALS — BP 130/74 | HR 68 | Temp 98.1°F | Resp 18 | Ht 59.0 in | Wt 131.9 lb

## 2014-06-17 DIAGNOSIS — K2100 Gastro-esophageal reflux disease with esophagitis, without bleeding: Secondary | ICD-10-CM | POA: Diagnosis not present

## 2014-06-17 MED ORDER — FAMOTIDINE 20 MG PO TABS: 20.0000 mg | ORAL_TABLET | Freq: Every day | ORAL | Status: DC | PRN

## 2014-06-17 NOTE — Progress Notes (Signed)
Presenting complaint;  Followup for GERD.  Subjective:  Patient is 68 year old Caucasian female who has history of erosive reflux esophagitis complicated by esophageal stricture. Last EGD was in May 2014 and she has been maintained on pantoprazole. She reports having no difficulty swallowing liquids or solids. She states she was not having any heartburn until 4 weeks ago. She is having daily heartburn at least once or twice a day. She states medication is not working. She denies nausea vomiting abdominal pain melena or rectal bleeding. She states her glycemic control has improved. She does not eat breakfast and regular times and therefore takes pantoprazole at different times. She has occasional regurgitation. She denies cough sore throat or hoarseness. She has gained 6 pounds as her last visit.   Current Medications: Outpatient Encounter Prescriptions as of 06/17/2014  Medication Sig  . clopidogrel (PLAVIX) 75 MG tablet Take 75 mg by mouth daily with breakfast.  . insulin aspart (NOVOLOG FLEXPEN) 100 UNIT/ML injection Inject 8 Units into the skin 3 (three) times daily before meals.   . insulin detemir (LEVEMIR FLEXPEN) 100 UNIT/ML injection Inject 24 Units into the skin at bedtime.   Marland Kitchen l-methylfolate-B6-B12 (METANX) 3-35-2 MG TABS Take 1 tablet by mouth 2 (two) times daily.  Marland Kitchen losartan (COZAAR) 25 MG tablet Take 25 mg by mouth daily.  . Multiple Vitamin (ONE-A-DAY 55 PLUS PO) Take by mouth daily.  . Multiple Vitamins-Minerals (HAIR/SKIN/NAILS) TABS Take 1 tablet by mouth daily.  . pantoprazole (PROTONIX) 40 MG tablet TAKE ONE TABLET BY MOUTH ONCE DAILY  . simvastatin (ZOCOR) 40 MG tablet Take 40 mg by mouth every evening.  . vitamin C (ASCORBIC ACID) 500 MG tablet Take 500 mg by mouth daily.  . vitamin E (VITAMIN E) 400 UNIT capsule Take 400 Units by mouth daily.  . [DISCONTINUED] doxycycline (VIBRAMYCIN) 100 MG capsule Take 1 capsule (100 mg total) by mouth 2 (two) times daily.  .  [DISCONTINUED] metroNIDAZOLE (FLAGYL) 500 MG tablet Take 1 tablet (500 mg total) by mouth 2 (two) times daily.  . [DISCONTINUED] Multiple Vitamin (MULITIVITAMIN WITH MINERALS) TABS Take 1 tablet by mouth daily.     Objective: Blood pressure 130/74, pulse 68, temperature 98.1 F (36.7 C), temperature source Oral, resp. rate 18, height  (1.499 m), weight 131 lb 14.4 oz (59.829 kg). Patient is alert and in no acute distress. Conjunctiva is pink. Sclera is nonicteric Oropharyngeal mucosa is normal. No neck masses or thyromegaly noted. Cardiac exam with regular rhythm normal S1 and S2. No murmur or gallop noted. Lungs are clear to auscultation. Abdomen symmetrical soft and nontender without organomegaly or masses.  No LE edema or clubbing noted.    Assessment:  #1. 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 Last EGD/ED was in May 2014. She has not experiencing frequent heartburn. Breakthrough symptoms may be because pantoprazole is not working anymore or she may be taking the medication incorrectly. It is also possible that she may have developed gastroparesis in which case PPI alone may not work. She does not have a long symptoms.  Plan:  Patient advised to eat breakfast at same time every day and take pantoprazole 30 minutes before breakfast. She can take Pepcid OTC 20 mg by mouth daily when necessary. Patient will keep symptom diary and call us with progress report in 3 weeks. If she does not feel better she will be switched to another PPI prior to considering solid phase gastric emptying study. Office visit in 3 months.

## 2014-06-17 NOTE — Patient Instructions (Signed)
Take pantoprazole 30 minutes before breakfast daily. Pepcid OTC 20 mg by mouth on as needed basis for breakthrough heartburn.  Progress report in 2-3 weeks

## 2014-06-24 ENCOUNTER — Ambulatory Visit: Payer: Medicare Other | Admitting: Nurse Practitioner

## 2014-06-26 ENCOUNTER — Ambulatory Visit: Payer: Medicare Other | Admitting: Nurse Practitioner

## 2014-06-26 ENCOUNTER — Ambulatory Visit: Payer: Medicare Other | Admitting: Neurology

## 2014-07-08 ENCOUNTER — Telehealth (INDEPENDENT_AMBULATORY_CARE_PROVIDER_SITE_OTHER): Payer: Self-pay | Admitting: *Deleted

## 2014-07-08 NOTE — Telephone Encounter (Signed)
Dr.Rehman was made aware. 

## 2014-07-08 NOTE — Telephone Encounter (Signed)
Progress Report - Adding the Zantac before supper, which is around 5 or 6 pm, at night works great.

## 2014-08-12 DIAGNOSIS — E109 Type 1 diabetes mellitus without complications: Secondary | ICD-10-CM | POA: Diagnosis not present

## 2014-08-12 DIAGNOSIS — E785 Hyperlipidemia, unspecified: Secondary | ICD-10-CM | POA: Diagnosis not present

## 2014-08-12 DIAGNOSIS — I10 Essential (primary) hypertension: Secondary | ICD-10-CM | POA: Diagnosis not present

## 2014-08-21 DIAGNOSIS — E1065 Type 1 diabetes mellitus with hyperglycemia: Secondary | ICD-10-CM | POA: Diagnosis not present

## 2014-08-21 DIAGNOSIS — E782 Mixed hyperlipidemia: Secondary | ICD-10-CM | POA: Diagnosis not present

## 2014-08-21 DIAGNOSIS — I10 Essential (primary) hypertension: Secondary | ICD-10-CM | POA: Diagnosis not present

## 2014-08-23 ENCOUNTER — Other Ambulatory Visit (INDEPENDENT_AMBULATORY_CARE_PROVIDER_SITE_OTHER): Payer: Self-pay | Admitting: Internal Medicine

## 2014-08-23 ENCOUNTER — Other Ambulatory Visit: Payer: Self-pay | Admitting: Neurology

## 2014-08-23 NOTE — Telephone Encounter (Signed)
Patient has appt in March  

## 2014-09-24 ENCOUNTER — Ambulatory Visit: Payer: Medicare Other | Admitting: Neurology

## 2014-09-24 ENCOUNTER — Ambulatory Visit (INDEPENDENT_AMBULATORY_CARE_PROVIDER_SITE_OTHER): Payer: Medicare Other | Admitting: Internal Medicine

## 2014-09-30 ENCOUNTER — Ambulatory Visit (INDEPENDENT_AMBULATORY_CARE_PROVIDER_SITE_OTHER): Payer: Medicare Other | Admitting: Internal Medicine

## 2014-10-10 ENCOUNTER — Encounter (INDEPENDENT_AMBULATORY_CARE_PROVIDER_SITE_OTHER): Payer: Self-pay | Admitting: Internal Medicine

## 2014-10-10 ENCOUNTER — Ambulatory Visit (INDEPENDENT_AMBULATORY_CARE_PROVIDER_SITE_OTHER): Payer: Medicare Other | Admitting: Internal Medicine

## 2014-10-10 VITALS — BP 134/80 | HR 70 | Temp 97.0°F | Resp 18 | Ht 59.0 in | Wt 135.8 lb

## 2014-10-10 DIAGNOSIS — K2100 Gastro-esophageal reflux disease with esophagitis, without bleeding: Secondary | ICD-10-CM

## 2014-10-10 MED ORDER — RANITIDINE HCL 75 MG PO TABS: 150.0000 mg | ORAL_TABLET | Freq: Every day | ORAL | Status: DC | PRN

## 2014-10-10 NOTE — Patient Instructions (Signed)
Call if symptoms relapse. Check with your physician about getting bone density.

## 2014-10-10 NOTE — Progress Notes (Signed)
Presenting complaint;  Follow-up for GERD.  Subjective:  Patient is 69 year old Caucasian female who is here for scheduled visit. She was last seen on 06/17/2014 for poorly controlled GERD symptoms. Patient was advised to take pantoprazole before breakfast. She was also advised to take Pepcid OTC or Zantac in the evening on as-needed basis. She has been using Zantac 150 mg. couple of times a week. She she feels better. She's had one or 2 episodes of heartburn per week relieved with times. These episodes do not last for long. She denies hoarseness cough or sore throat. She also denies dysphagia abdominal pain melena or rectal bleeding. She does not remember when and if she's had bone density study.  Current Medications: Outpatient Encounter Prescriptions as of 10/10/2014  Medication Sig  . clopidogrel (PLAVIX) 75 MG tablet TAKE ONE TABLET BY MOUTH ONCE DAILY  . famotidine (PEPCID) 20 MG tablet Take 1 tablet (20 mg total) by mouth daily as needed for heartburn or indigestion.  . insulin aspart (NOVOLOG FLEXPEN) 100 UNIT/ML injection Inject 10 Units into the skin 3 (three) times daily before meals.   . insulin detemir (LEVEMIR FLEXPEN) 100 UNIT/ML injection Inject 24 Units into the skin at bedtime.   Marland Kitchen. l-methylfolate-B6-B12 (METANX) 3-35-2 MG TABS Take 1 tablet by mouth 2 (two) times daily.  Marland Kitchen. losartan (COZAAR) 25 MG tablet Take 25 mg by mouth daily.  . Multiple Vitamin (ONE-A-DAY 55 PLUS PO) Take by mouth daily.  . Multiple Vitamins-Minerals (HAIR/SKIN/NAILS) TABS Take 1 tablet by mouth daily.  . pantoprazole (PROTONIX) 40 MG tablet TAKE ONE TABLET BY MOUTH ONCE DAILY  . simvastatin (ZOCOR) 40 MG tablet Take 40 mg by mouth every evening.  . vitamin C (ASCORBIC ACID) 500 MG tablet Take 500 mg by mouth daily.  . vitamin E (VITAMIN E) 400 UNIT capsule Take 400 Units by mouth daily.  . [DISCONTINUED] clopidogrel (PLAVIX) 75 MG tablet Take 75 mg by mouth daily with breakfast.      Objective: Blood pressure 134/80, pulse 70, temperature 97 F (36.1 C), temperature source Oral, resp. rate 18, height 4\' 11"  (1.499 m), weight 135 lb 12.8 oz (61.598 kg). Patient is alert and in no acute distress. Conjunctiva is pink. Sclera is nonicteric Oropharyngeal mucosa is normal. No neck masses or thyromegaly noted. Cardiac exam with regular rhythm normal S1 and S2. No murmur or gallop noted. Lungs are clear to auscultation. Abdomen is soft and nontender without organomegaly or masses.  No LE edema or clubbing noted. 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      Assessment:  #1. GERD complicated by esophageal stricture, S/P EGD/ED in  May 2014. Symptoms well controlled and she does not have dysphagia.  Plan:  OTC H2 be changed to Zantac which she is using instead of Pepcid. Check with PCP about getting bone density study. Office visit in 1 year.

## 2014-10-14 ENCOUNTER — Ambulatory Visit (INDEPENDENT_AMBULATORY_CARE_PROVIDER_SITE_OTHER): Payer: Medicare Other | Admitting: Internal Medicine

## 2014-11-08 DIAGNOSIS — H40013 Open angle with borderline findings, low risk, bilateral: Secondary | ICD-10-CM | POA: Diagnosis not present

## 2014-11-11 DIAGNOSIS — I10 Essential (primary) hypertension: Secondary | ICD-10-CM | POA: Diagnosis not present

## 2014-11-11 DIAGNOSIS — E782 Mixed hyperlipidemia: Secondary | ICD-10-CM | POA: Diagnosis not present

## 2014-11-11 DIAGNOSIS — Z713 Dietary counseling and surveillance: Secondary | ICD-10-CM | POA: Diagnosis not present

## 2014-11-11 DIAGNOSIS — E1065 Type 1 diabetes mellitus with hyperglycemia: Secondary | ICD-10-CM | POA: Diagnosis not present

## 2014-11-20 DIAGNOSIS — I10 Essential (primary) hypertension: Secondary | ICD-10-CM | POA: Diagnosis not present

## 2014-11-20 DIAGNOSIS — E782 Mixed hyperlipidemia: Secondary | ICD-10-CM | POA: Diagnosis not present

## 2014-11-20 DIAGNOSIS — E1065 Type 1 diabetes mellitus with hyperglycemia: Secondary | ICD-10-CM | POA: Diagnosis not present

## 2014-11-25 DIAGNOSIS — Z0000 Encounter for general adult medical examination without abnormal findings: Secondary | ICD-10-CM | POA: Diagnosis not present

## 2014-11-25 DIAGNOSIS — E782 Mixed hyperlipidemia: Secondary | ICD-10-CM | POA: Diagnosis not present

## 2014-11-25 DIAGNOSIS — Z6826 Body mass index (BMI) 26.0-26.9, adult: Secondary | ICD-10-CM | POA: Diagnosis not present

## 2014-11-25 DIAGNOSIS — I10 Essential (primary) hypertension: Secondary | ICD-10-CM | POA: Diagnosis not present

## 2014-12-03 ENCOUNTER — Encounter: Payer: Self-pay | Admitting: Neurology

## 2014-12-03 ENCOUNTER — Ambulatory Visit (INDEPENDENT_AMBULATORY_CARE_PROVIDER_SITE_OTHER): Payer: Medicare Other | Admitting: Neurology

## 2014-12-03 VITALS — BP 151/69 | HR 90 | Ht 59.0 in | Wt 138.8 lb

## 2014-12-03 DIAGNOSIS — Z8673 Personal history of transient ischemic attack (TIA), and cerebral infarction without residual deficits: Secondary | ICD-10-CM | POA: Diagnosis not present

## 2014-12-03 DIAGNOSIS — I6930 Unspecified sequelae of cerebral infarction: Secondary | ICD-10-CM

## 2014-12-03 NOTE — Progress Notes (Signed)
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  She returns for followup of the last visit on 06/14/12. She states she'll doing well she's had no recurrent stroke or TIA symptoms. She is tolerating plavix without significant bleeding but does complain of mild bruising. She states her sugars have all been under good control. She had profile checked by her primary physician a few months ago and was apparently unremarkable though I do not have those results. She states her blood pressure is also under good control. She's continues to have peripheral vision loss in the left and has not been driving. She had a brief episode of dizziness and blurred vision 2 weeks ago which lasted couple of days. She was seen at [facility] emergency room where CT head showed no acute abnormalities. She denied any accompanying headache or focal neurological symptoms. She was seen by ophthalmologist and diagnosed with glaucoma and plans treatment of that stone. Update 06/21/13 She returns for followup after last visit on 12/14/12. She continues to do well without recurrent stroke or TIA symptoms. She continues to have peripheral vision loss in the left and is some difficulty with reading and walking because of that. She remains on Plavix and is tolerating it well without significant bleeding or bruising. She states her blood pressure is well controlled and usually is and 120s but it was elevated today in our office at 135/65. Her fasting sugars range in the 120s to 130s range. She states her last hemoglobin A1c was 6.8 when checked to 3 months ago. She also had carotid ultrasound done last in November 2012  by her primary physician. She is significantly bothered by  chronic right knee pain for which she has seen Dr.Alusio but she has been reluctant for knee  replacement. Update 12/03/2014 : She returns for follow-up accompanied by husband after last visit a year and half ago. She continues to do well without recurrent stroke or TIA symptoms following her original stroke in August 2012. She remains frustrated with lack of improvement in her peripheral vision and inability to drive. She still has some mild gait difficulties due to her arthritis. She does take Aleve occasionally and is worried about including stroke and bleeding risk. She remains on Plavix which is tolerating well with only minor bruising and no bleeding complications. Her sugars have not been well controlled and she is seeing her endocrinologist in [place] and last hemoglobin A1c was 8.1. Blood pressure remains quite well controlled at home but she does have some white coat hypertension. She has been having lipid profile checked every 3 months by her endocrinologist and has been under control. Her carotid ultrasound done on 08/14/13 in our office which I have personally reviewed and showed no significant extracranial stenosis.  ROS: 14 system review of systems is positive only for blurred vision, loss of vision and gait difficulty only and all other systems negative.Marland Kitchen Physical Exam General: well developed, well nourished, seated, in no evident distress Head: head normocephalic and atraumatic.   Neck: supple with no carotid or supraclavicular bruits Cardiovascular: regular rate and rhythm, no murmurs Musculoskeletal slight deformity fingers from osteoarthritis bilaterally  Neurologic Exam Mental Status: Awake and fully alert. Oriented to place and time. Recent and remote memory intact. Attention span, concentration and fund of knowledge appropriate. Mood and affect appropriate.  Cranial Nerves: Fundoscopic exam not done  . Pupils  equal, briskly reactive to light. Extraocular movements full without nystagmus. Visual fields show dense left homonymous hemianopia to confrontation. Hearing intact  and s. Facial sensation intact. Face, tongue, palate move normally and symmetrically. Neck flexion and extension normal.  Motor: Normal bulk and tone. Normal strength in all tested extremity muscles. Sensory.: intact to touch and pinprick and vibratory sensation.  Coordination: Rapid alternating movements normal in all extremities. Finger-to-nose and heel-to-shin performed accurately bilaterally. Gait and Station: Arises from chair without difficulty. Stance is normal. Gait demonstrates normal stride length and balance but favors right knee due to pain . Able to heel, toe and tandem walk with mild difficulty.  Reflexes: 1+ and symmetric. Toes downgoing.     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 Vascular risk factors of diabetes, hyperlipidemia and intracranial atherosclerosis.   PLAN:   I had a long d/w patient and husband  about her remote stroke, risk for recurrent stroke/TIAs, personally independently reviewed imaging studies and stroke evaluation results and answered questions.Continue Plavix  for secondary stroke prevention and maintain strict control of hypertension with blood pressure goal below 130/90, diabetes with hemoglobin A1c goal below 6.5% and lipids with LDL cholesterol goal below 100 mg/dL. I also advised the patient not to drive due to persistent dense homonymous hemianopsia. Consider participation in novavision therapy if she can afford it to help with improvement in peripheral visual field . Check screening carotid ultrasound study. No scheduled followup in the future with me  But call if needed for appointment.

## 2014-12-03 NOTE — Patient Instructions (Signed)
I had a long d/w patient about her remote stroke, risk for recurrent stroke/TIAs, personally independently reviewed imaging studies and stroke evaluation results and answered questions.Continue Plavix  for secondary stroke prevention and maintain strict control of hypertension with blood pressure goal below 130/90, diabetes with hemoglobin A1c goal below 6.5% and lipids with LDL cholesterol goal below 100 mg/dL. I also advised the patient not to drive due to persistent dense homonymous hemianopsia. Consider participation in novavision therapy if she can afford it to help with improvement in peripheral visual field . Check screening carotid ultrasound study. No scheduled followup in the future with me  But call if needed for appointment.

## 2014-12-10 ENCOUNTER — Telehealth: Payer: Self-pay | Admitting: Radiology

## 2014-12-10 NOTE — Telephone Encounter (Signed)
Called home phone and left a v/ message with date of 12/25/14 @ 2pm.  Arrive @ 1:45 for Carotid doppler study.  Patient to call and confirm date and time.

## 2014-12-12 ENCOUNTER — Other Ambulatory Visit: Payer: Medicare Other

## 2014-12-25 ENCOUNTER — Ambulatory Visit (INDEPENDENT_AMBULATORY_CARE_PROVIDER_SITE_OTHER): Payer: Medicare Other

## 2014-12-25 ENCOUNTER — Telehealth: Payer: Self-pay | Admitting: Neurology

## 2014-12-25 DIAGNOSIS — I6930 Unspecified sequelae of cerebral infarction: Secondary | ICD-10-CM

## 2014-12-25 DIAGNOSIS — Z8673 Personal history of transient ischemic attack (TIA), and cerebral infarction without residual deficits: Secondary | ICD-10-CM | POA: Diagnosis not present

## 2014-12-25 MED ORDER — CLOPIDOGREL BISULFATE 75 MG PO TABS: 75.0000 mg | ORAL_TABLET | Freq: Every day | ORAL | Status: DC

## 2014-12-25 NOTE — Telephone Encounter (Signed)
Patient requesting refills of clopidogrel 75 mg. She is out of refills. Asking that it be faxed or [facility] [place] for 3 months. Her insurance says it may be less expensive if she gets a 90-day supply. Her best contact is  [phone] and it is okay to leave a message.

## 2014-12-25 NOTE — Telephone Encounter (Signed)
Rx has been sent.  I called back to advise.  Got no answer.  Left message.   

## 2015-02-07 DIAGNOSIS — H40013 Open angle with borderline findings, low risk, bilateral: Secondary | ICD-10-CM | POA: Diagnosis not present

## 2015-02-19 DIAGNOSIS — Z713 Dietary counseling and surveillance: Secondary | ICD-10-CM | POA: Diagnosis not present

## 2015-02-19 DIAGNOSIS — I10 Essential (primary) hypertension: Secondary | ICD-10-CM | POA: Diagnosis not present

## 2015-02-19 DIAGNOSIS — E785 Hyperlipidemia, unspecified: Secondary | ICD-10-CM | POA: Diagnosis not present

## 2015-02-19 DIAGNOSIS — E1065 Type 1 diabetes mellitus with hyperglycemia: Secondary | ICD-10-CM | POA: Diagnosis not present

## 2015-02-19 DIAGNOSIS — Z794 Long term (current) use of insulin: Secondary | ICD-10-CM | POA: Diagnosis not present

## 2015-02-20 ENCOUNTER — Other Ambulatory Visit (INDEPENDENT_AMBULATORY_CARE_PROVIDER_SITE_OTHER): Payer: Self-pay | Admitting: Internal Medicine

## 2015-02-21 ENCOUNTER — Telehealth (INDEPENDENT_AMBULATORY_CARE_PROVIDER_SITE_OTHER): Payer: Self-pay | Admitting: *Deleted

## 2015-02-21 MED ORDER — PANTOPRAZOLE SODIUM 40 MG PO TBEC: 40.0000 mg | DELAYED_RELEASE_TABLET | Freq: Every day | ORAL | Status: DC

## 2015-02-21 NOTE — Telephone Encounter (Signed)
Nancy Sutton is out of refills on her Pantoprazole. It will be cheaper for her to get a 3 month supply. Her return phone number is [phone].

## 2015-02-21 NOTE — Telephone Encounter (Signed)
Rx for medication has been sent with 90 day supply. Please let patient know.

## 2015-02-24 DIAGNOSIS — H40013 Open angle with borderline findings, low risk, bilateral: Secondary | ICD-10-CM | POA: Diagnosis not present

## 2015-02-24 NOTE — Telephone Encounter (Signed)
LM at patient's home that a 3 month supply of her Pantoprazole has been send in to her pharmacy.

## 2015-02-26 DIAGNOSIS — E785 Hyperlipidemia, unspecified: Secondary | ICD-10-CM | POA: Diagnosis not present

## 2015-02-26 DIAGNOSIS — E1165 Type 2 diabetes mellitus with hyperglycemia: Secondary | ICD-10-CM | POA: Diagnosis not present

## 2015-02-26 DIAGNOSIS — I10 Essential (primary) hypertension: Secondary | ICD-10-CM | POA: Diagnosis not present

## 2015-03-26 DIAGNOSIS — H40023 Open angle with borderline findings, high risk, bilateral: Secondary | ICD-10-CM | POA: Diagnosis not present

## 2015-05-28 DIAGNOSIS — I10 Essential (primary) hypertension: Secondary | ICD-10-CM | POA: Diagnosis not present

## 2015-05-28 DIAGNOSIS — E785 Hyperlipidemia, unspecified: Secondary | ICD-10-CM | POA: Diagnosis not present

## 2015-05-28 DIAGNOSIS — E1065 Type 1 diabetes mellitus with hyperglycemia: Secondary | ICD-10-CM | POA: Diagnosis not present

## 2015-06-04 DIAGNOSIS — E1065 Type 1 diabetes mellitus with hyperglycemia: Secondary | ICD-10-CM | POA: Diagnosis not present

## 2015-06-04 DIAGNOSIS — E785 Hyperlipidemia, unspecified: Secondary | ICD-10-CM | POA: Diagnosis not present

## 2015-06-04 DIAGNOSIS — I10 Essential (primary) hypertension: Secondary | ICD-10-CM | POA: Diagnosis not present

## 2015-06-18 ENCOUNTER — Encounter (INDEPENDENT_AMBULATORY_CARE_PROVIDER_SITE_OTHER): Payer: Self-pay | Admitting: *Deleted

## 2015-06-26 ENCOUNTER — Other Ambulatory Visit: Payer: Self-pay

## 2015-06-26 MED ORDER — GLUCOSE BLOOD VI STRP: ORAL_STRIP | Status: DC

## 2015-06-30 ENCOUNTER — Telehealth: Payer: Self-pay | Admitting: "Endocrinology

## 2015-06-30 MED ORDER — GLUCOSE BLOOD VI STRP: ORAL_STRIP | Status: DC

## 2015-06-30 NOTE — Telephone Encounter (Signed)
Nancy [facility] called saying [facility] told her they sent a Rx request for Test Strips and haven't heard back from [place]. She's out of them and would like the refill sent to her pharmacy today. Please call pt if you have questions or concerns.  Pt ph# [phone] Thank you.

## 2015-06-30 NOTE — Addendum Note (Signed)
Addended by: Jannifer Franklin A on: 06/30/2015 08:22 AM   Modules accepted: Orders

## 2015-07-09 ENCOUNTER — Telehealth: Payer: Self-pay | Admitting: "Endocrinology

## 2015-07-09 NOTE — Telephone Encounter (Signed)
Patient needs lantus pen or novalog or humalog pen; she is in the [place] and was wondering if we had any

## 2015-07-09 NOTE — Telephone Encounter (Signed)
Pt will pick up samples in the a.m.

## 2015-09-03 DIAGNOSIS — E785 Hyperlipidemia, unspecified: Secondary | ICD-10-CM | POA: Diagnosis not present

## 2015-09-03 DIAGNOSIS — E1065 Type 1 diabetes mellitus with hyperglycemia: Secondary | ICD-10-CM | POA: Diagnosis not present

## 2015-09-03 DIAGNOSIS — I10 Essential (primary) hypertension: Secondary | ICD-10-CM | POA: Diagnosis not present

## 2015-09-03 LAB — HEMOGLOBIN A1C: HEMOGLOBIN A1C: 8.1

## 2015-09-10 ENCOUNTER — Ambulatory Visit (INDEPENDENT_AMBULATORY_CARE_PROVIDER_SITE_OTHER): Payer: Medicare Other | Admitting: "Endocrinology

## 2015-09-10 ENCOUNTER — Encounter: Payer: Self-pay | Admitting: "Endocrinology

## 2015-09-10 VITALS — BP 144/89 | HR 88 | Ht 60.0 in | Wt 130.0 lb

## 2015-09-10 DIAGNOSIS — I10 Essential (primary) hypertension: Secondary | ICD-10-CM | POA: Diagnosis not present

## 2015-09-10 DIAGNOSIS — E1059 Type 1 diabetes mellitus with other circulatory complications: Secondary | ICD-10-CM | POA: Diagnosis not present

## 2015-09-10 DIAGNOSIS — E7800 Pure hypercholesterolemia, unspecified: Secondary | ICD-10-CM

## 2015-09-10 HISTORY — DX: Essential (primary) hypertension: I10

## 2015-09-10 NOTE — Patient Instructions (Signed)
Advice for weight management -For most of us the best way to control glucose is by good diet. Generally speaking, diet management means restricting carbohydrate consumption to minimum possible (and to unprocessed or minimally processed complex starch) and increasing protein intake (animal or plant source), fruits, and vegetables.  -Sticking to a routine mealtime to eat 3 meals a day and avoiding unnecessary snacks is shown to have a big role in weight control.  -It is better to avoid simple carbohydrates including: Cakes, Desserts, Ice Cream, Soda (diet and regular), Sweet Tea, Candies, Chips, Cookies, Artificial Sweeteners, and "Sugar-free" Products.   -Exercise: 30 minutes a day 3-4 days a week, or 150 minutes a week. Combine stretch, strength, and aerobic activities. You may seek evaluation by your heart doctor prior to initiating exercise if you have high risk for heart disease.  -If you are interested, we can schedule a visit with Norm SaltPenny Crumpton, RDN, CDE for individualized nutrition education.

## 2015-09-11 ENCOUNTER — Other Ambulatory Visit (INDEPENDENT_AMBULATORY_CARE_PROVIDER_SITE_OTHER): Payer: Self-pay | Admitting: *Deleted

## 2015-09-11 ENCOUNTER — Telehealth: Payer: Self-pay | Admitting: Neurology

## 2015-09-11 ENCOUNTER — Telehealth (INDEPENDENT_AMBULATORY_CARE_PROVIDER_SITE_OTHER): Payer: Self-pay | Admitting: *Deleted

## 2015-09-11 MED ORDER — CLOPIDOGREL BISULFATE 75 MG PO TABS: 75.0000 mg | ORAL_TABLET | Freq: Every day | ORAL | Status: DC

## 2015-09-11 MED ORDER — PANTOPRAZOLE SODIUM 40 MG PO TBEC: 40.0000 mg | DELAYED_RELEASE_TABLET | Freq: Every day | ORAL | Status: DC

## 2015-09-11 NOTE — Progress Notes (Signed)
Subjective:    Patient ID: Nancy Sutton, female    DOB: 10-04-1945, PCP Cassell Smiles., MD   Past Medical History  Diagnosis Date  . Diabetes mellitus     since 2008  . Hypercholesteremia   . GERD (gastroesophageal reflux disease)   . 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 Arthritis   . Hyperlipidemia   . Essential hypertension, benign 09/10/2015   Past Surgical History  Procedure Laterality Date  . Eye surgery  10-2010    left eye removal of cataract  . Tubal ligation  1984  . Cataract extraction w/phaco  11/01/2011    Procedure: CATARACT EXTRACTION PHACO AND INTRAOCULAR LENS PLACEMENT (IOC);  Surgeon: Gemma Payor, MD;  Location: [facility] ORS;  Service: Ophthalmology;  Laterality: Right;  CDE 12.48  . Esophagogastroduodenoscopy (egd) with esophageal dilation N/A 02/14/2013    Procedure: ESOPHAGOGASTRODUODENOSCOPY (EGD) WITH ESOPHAGEAL DILATION;  Surgeon: Malissa Hippo, MD;  Location: [facility] ENDO SUITE;  Service: Endoscopy;  Laterality: N/A;  200-moved to 255 Ann to notify pt   Social History   Social History  . Marital Status: Married    Spouse Name: wayne  . Number of Children: 2  . 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 Smoking status: Never Smoker   . Smokeless tobacco: Never Used  . Alcohol Use: No  . Drug Use: No  . Sexual Activity: Yes    Birth Control/ Protection: None   Other Topics Concern  . None   Social History Narrative   Patient is married with 2 children.   Patient is right handed.   Patient has hs education.   Patient drinks 3-5 glasses of tea daily.   Outpatient Encounter Prescriptions as of 09/10/2015  Medication Sig  . LEVEMIR FLEXTOUCH 100 UNIT/ML Pen Inject 32 Units into the skin at bedtime.   Marland Kitchen NOVOLOG FLEXPEN 100 UNIT/ML FlexPen Inject 12-18 Units into the skin 3 (three) times daily with meals.  Marland Kitchen glucose blood (TRUE METRIX BLOOD GLUCOSE  TEST) test strip Use as instructed 4 x daily  . l-methylfolate-B6-B12 (METANX) 3-35-2 MG TABS Take 1 tablet by mouth 2 (two) times daily.  Marland Kitchen losartan (COZAAR) 25 MG tablet Take 25 mg by mouth daily.  . Multiple Vitamin (ONE-A-DAY 55 PLUS PO) Take by mouth daily.  . Multiple Vitamins-Minerals (HAIR/SKIN/NAILS) TABS Take 1 tablet by mouth daily.  . ranitidine (ZANTAC) 75 MG tablet Take 2 tablets (150 mg total) by mouth daily as needed for heartburn.  . simvastatin (ZOCOR) 40 MG tablet Take 40 mg by mouth every evening.  . vitamin C (ASCORBIC ACID) 500 MG tablet Take 500 mg by mouth daily.  . vitamin E (VITAMIN E) 400 UNIT capsule Take 400 Units by mouth daily.  . [DISCONTINUED] clopidogrel (PLAVIX) 75 MG tablet Take 1 tablet (75 mg total) by mouth daily.  . [DISCONTINUED] pantoprazole (PROTONIX) 40 MG tablet Take 1 tablet (40 mg total) by mouth daily.   No facility-administered encounter medications on file as of 09/10/2015.   ALLERGIES: Allergies  Allergen Reactions  . Ampicillin Other (See Comments)    Breaks out in bumps  . Bromday [Bromfenac Sodium] Itching and Swelling  . Tetanus Toxoids Other (See Comments)    Breaks out in bumps   VACCINATION STATUS: Immunization History  Administered Date(s) Administered  . Rabies, IM 02/13/2014, 02/16/2014, 02/20/2014, 02/27/2014  Diabetes She presents for her follow-up diabetic visit. She has type 1 diabetes mellitus. Onset time: She was diagnosed at approximate age of 69 years. Her disease course has been worsening. There are no hypoglycemic associated symptoms. Pertinent negatives for hypoglycemia include no confusion, headaches, pallor or seizures. There are no diabetic associated symptoms. Pertinent negatives for diabetes include no chest pain, no polydipsia, no polyphagia and no polyuria. There are no hypoglycemic complications. Symptoms are worsening. Diabetic complications include a CVA. Current diabetic treatment includes intensive  insulin program. She is compliant with treatment most of the time. She is following a generally unhealthy diet. When asked about meal planning, she reported none. She has had a previous visit with a dietitian. She never participates in exercise. Her home blood glucose trend is increasing steadily. Her breakfast blood glucose range is generally 180-200 mg/dl. Her lunch blood glucose range is generally 180-200 mg/dl. Her dinner blood glucose range is generally 180-200 mg/dl. Her overall blood glucose range is 180-200 mg/dl. An ACE inhibitor/angiotensin II receptor blocker is being taken.  Hyperlipidemia This is a chronic problem. The current episode started more than 1 year ago. Exacerbating diseases include diabetes. Pertinent negatives include no chest pain, myalgias or shortness of breath. Current antihyperlipidemic treatment includes statins. Risk factors for coronary artery disease include diabetes mellitus, dyslipidemia, hypertension and a sedentary lifestyle.  Hypertension This is a chronic problem. The current episode started more than 1 year ago. Pertinent negatives include no chest pain, headaches, palpitations or shortness of breath. Past treatments include ACE inhibitors. Hypertensive end-organ damage includes CVA.     Review of Systems  Constitutional: Negative for fever, chills and unexpected weight change.  HENT: Negative for trouble swallowing and voice change.   Eyes: Negative for visual disturbance.  Respiratory: Negative for cough, shortness of breath and wheezing.   Cardiovascular: Negative for chest pain, palpitations and leg swelling.  Gastrointestinal: Negative for nausea, vomiting and diarrhea.  Endocrine: Negative for cold intolerance, heat intolerance, polydipsia, polyphagia and polyuria.  Musculoskeletal: Negative for myalgias and arthralgias.  Skin: Negative for color change, pallor, rash and wound.  Neurological: Negative for seizures and headaches.   Psychiatric/Behavioral: Negative for suicidal ideas and confusion.    Objective:    BP 144/89 mmHg  Pulse 88  Ht 5' (1.524 m)  Wt 130 lb (58.968 kg)  BMI 25.39 kg/m2  SpO2 99%  Wt Readings from Last 3 Encounters:  09/10/15 130 lb (58.968 kg)  12/03/14 138 lb 12.8 oz (62.959 kg)  10/10/14 135 lb 12.8 oz (61.598 kg)    Physical Exam  Constitutional: She is oriented to person, place, and time. She appears well-developed.  HENT:  Head: Normocephalic and atraumatic.  Eyes: EOM are normal.  Neck: Normal range of motion. Neck supple. No tracheal deviation present. No thyromegaly present.  Cardiovascular: Normal rate and regular rhythm.   Pulmonary/Chest: Effort normal and breath sounds normal.  Abdominal: Soft. Bowel sounds are normal. There is no tenderness. There is no guarding.  Musculoskeletal: Normal range of motion. She exhibits no edema.  Neurological: She is alert and oriented to person, place, and time. She has normal reflexes. No cranial nerve deficit. Coordination normal.  Skin: Skin is warm and dry. No rash noted. No erythema. No pallor.  Psychiatric: She has a normal mood and affect. Judgment normal.     Lab Results  Component Value Date   WBC 10.9* 02/13/2014   HGB 11.3* 02/13/2014   HCT 33.4* 02/13/2014   MCV 88.8 02/13/2014  PLT 225 02/13/2014   Chemistry (most recent): Lab Results  Component Value Date   NA 139 02/13/2014   K 4.2 02/13/2014   CL 99 02/13/2014   CO2 24 02/13/2014   BUN 24* 02/13/2014   CREATININE 0.67 02/13/2014   Diabetic Labs (most recent): Lab Results  Component Value Date   HGBA1C 8.1 09/03/2015   HGBA1C 8.8* 04/30/2011   Lipid profile (most recent): Lab Results  Component Value Date   TRIG 304* 05/01/2011   CHOL 206* 05/01/2011         Assessment & Plan:   1. 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 These are all discussed in detail with the patient.  Patient came with fluctuating glucose profile, and  recent A1c of 8.1 %. This is due to her inconsistent meals habit and pattern and unable to avoid simple carbohydrates like ice cream.  Glucose logs and insulin administration records pertaining to this visit,  to be scanned into patient's records.  Recent labs reviewed.   - I have re-counseled the patient on diet management   by adopting a carbohydrate restricted / protein rich  Diet.  - Suggestion is made for patient to avoid simple carbohydrates   from their diet including Cakes , Desserts, Ice Cream,  Soda (  diet and regular) , Sweet Tea , Candies,  Chips, Cookies, Artificial Sweeteners,   and "Sugar-free" Products .  This will help patient to have stable blood glucose profile and potentially avoid unintended  Weight gain.  - Patient is advised to stick to a routine mealtimes to eat 3 meals  a day and avoid unnecessary snacks ( to snack only to correct hypoglycemia).  - The patient  has been  scheduled with Nancy Sutton, RDN, CDE for individualized DM education.  - I have approached patient with the following individualized plan to manage diabetes and patient agrees.  - I will continue Levemir 32 units qhs, increase Novolog to 12 units TIDAC plus SSI.  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 -Patient is not a candidate for metformin,SGLT2 inhibitors, and incretin therapy.  - Patient specific target  for A1c; LDL, HDL, Triglycerides, and  Waist Circumference were discussed in detail.  2) BP/HTN: Uncontrolled, patient claims she has controlled blood pressure at home. Continue current medications including ACEI/ARB. 3) Lipids/HPL:  continue statins. 4)  Weight/Diet: CDE  consult in progress, exercise, and carbohydrates information provided.  5) Chronic Care/Health Maintenance:  -Patient is on ACEI/ARB and Statin medications and encouraged to continue to follow up with Ophthalmology, Podiatrist at least yearly or according to recommendations, and advised to  stay away from smoking. I have recommended yearly flu vaccine and pneumonia vaccination at least every 5 years; moderate intensity exercise for up to 150 minutes weekly; and  sleep for at least 7 hours a day.  - 25 minutes of time was spent on the care of this patient , 50% of which was applied for counseling on diabetes complications and their preventions.  - I advised patient to maintain close follow up with Cassell Smiles., MD for primary care needs.  Patient is asked to bring meter and  blood glucose logs during their next visit.  Follow up plan: -Return in about 3 months (around 12/09/2015) for diabetes, high blood pressure, high cholesterol, follow up with pre-visit labs, meter, and logs.  Marquis Lunch, MD Phone: [phone]  Fax: [phone]   09/11/2015, 6:48 PM

## 2015-09-11 NOTE — Telephone Encounter (Signed)
This was taken care of and the patient was advised.

## 2015-09-11 NOTE — Telephone Encounter (Signed)
Pt wants to know if she can get her generic Plavix sent in for 90 day rx as her insurance said at the first of the year it will be cheaper for her to get 3 month supplies vs. 1 month supply.  It will now go to [facility] on [address] in [place].  The pharmacy phone number is [phone] and the fax is [phone].

## 2015-09-11 NOTE — Telephone Encounter (Signed)
Patient called in, RX insurance changing 1st of year and it will be cheaper for her to get 90 day supply on medication -- she would like for us to call in new RX for Pantoprazole for [facility] and they will hold it until she needs it, any questions she can be reached at [phone]

## 2015-09-11 NOTE — Telephone Encounter (Signed)
Patient called in and ask that we send a new prescription to [facility] for 09/2015. Her insurance is changing and this coming year it will be cheaper for a 90 supply. A prescription was e-scribed for [facility] to [facility]. Patient was made aware.

## 2015-09-24 ENCOUNTER — Telehealth: Payer: Self-pay | Admitting: Neurology

## 2015-09-24 DIAGNOSIS — H401131 Primary open-angle glaucoma, bilateral, mild stage: Secondary | ICD-10-CM | POA: Diagnosis not present

## 2015-09-24 NOTE — Telephone Encounter (Signed)
Rn call patient and needs clearance for dental procedure. Rn stated the dentist can send over a clearance form. Fax number for [facility] was given. [facility]. Rn stated she needs to continue seeing a neuro md for her plavix refill. Appt schedule with Carolyn(NP). Pt would like a doppler study done. Rn stated to discuss it at the appt in February. Pt verbalized understanding.

## 2015-09-24 NOTE — Telephone Encounter (Signed)
Patient is calling. She needs to have a tooth extracted and wants to know if she should stop taking clopidogrel (PLAVIX) 75 MG tablet for 3 or 5 days before the procedure. The patient's dentist was not sure. Please call and advise. The patient will be gone today so please leave a message. Thank you.

## 2015-09-25 ENCOUNTER — Telehealth: Payer: Self-pay | Admitting: Neurology

## 2015-09-25 NOTE — Telephone Encounter (Signed)
Nancy Hashimotoatricia with Dr Antony ContrasMichael Burleson [phone] called said pt was seen in their office today and needs to have 2 teeth extracted. Pt said she is on blood thinner. Nancy Hashimotoatricia is inquiring when pt will need to stop blood thinner prior to extraction?

## 2015-09-25 NOTE — Telephone Encounter (Signed)
LFt message for Nancy Sutton at Dr. Antony ContrasMichael Burleson office about when to stop blood thinner prior to procedure. Rn gave fax number for clearance form.

## 2015-09-26 NOTE — Telephone Encounter (Signed)
Clearance letter fax to Dr.Michael Burleson dental office for patient Form was fax twice and confirm via fax. RN spoke to [place] and form was receive at office.

## 2015-09-26 NOTE — Telephone Encounter (Signed)
Rn call patient that the clearance form was fax. Patient understood the risk of a small TIA or stroke per Dr.Seth with being off Plavix for 5 days.

## 2015-10-15 ENCOUNTER — Encounter (INDEPENDENT_AMBULATORY_CARE_PROVIDER_SITE_OTHER): Payer: Self-pay | Admitting: Internal Medicine

## 2015-10-15 ENCOUNTER — Ambulatory Visit (INDEPENDENT_AMBULATORY_CARE_PROVIDER_SITE_OTHER): Payer: Medicare Other | Admitting: Internal Medicine

## 2015-10-15 VITALS — BP 146/80 | HR 84 | Temp 98.0°F | Ht 59.0 in | Wt 133.9 lb

## 2015-10-15 DIAGNOSIS — K219 Gastro-esophageal reflux disease without esophagitis: Secondary | ICD-10-CM | POA: Diagnosis not present

## 2015-10-15 NOTE — Progress Notes (Signed)
Subjective:    Patient ID: Nancy Sutton, female    DOB: [birth year], 70 y.o.   MRN: [id number]  HPI Here today for f/u of her GERD. She was last seen in January of 2016.  She tells me today she is doing good. She is having some acid reflux.  She takes Protonix in the am and Zantac at lunch.  Her appetite is good. She has lost 1 pounds. No dysphagia.  Hx of diabetes x 9 year.  BMs are normal. No melena or BRRB. Usually has one BM a day.  She has never undergone a colonoscopy.    HA1C  8.2 in December.    EGD with ED.  Indications: Patient is 70 year old Caucasian female with history of GERD who presents with 2-3 month history of solid food dysphagia. 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 This stricture was dilated with a balloon to 18 mm. Small sliding hiatal hernia. Small polyp there are cold biopsy from proximal esophagus possibly a papilloma. Review of Systems Past Medical History  Diagnosis Date  . Diabetes mellitus     since 2008  . Hypercholesteremia   . GERD (gastroesophageal reflux disease)   . 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 Arthritis   . Hyperlipidemia   . Essential hypertension, benign 09/10/2015    Past Surgical History  Procedure Laterality Date  . Eye surgery  10-2010    left eye removal of cataract  . Tubal ligation  1984  . Cataract extraction w/phaco  11/01/2011    Procedure: CATARACT EXTRACTION PHACO AND INTRAOCULAR LENS PLACEMENT (IOC);  Surgeon: Gemma Payor, MD;  Location: [facility];  Service: Ophthalmology;  Laterality: Right;  CDE 12.48  . Esophagogastroduodenoscopy (egd) with esophageal dilation N/A 02/14/2013    Procedure: ESOPHAGOGASTRODUODENOSCOPY (EGD) WITH ESOPHAGEAL DILATION;  Surgeon: Malissa Hippo, MD;  Location: [facility];  Service: Endoscopy;  Laterality:  N/A;  200-moved to 255 Ann to notify pt    Allergies  Allergen Reactions  . Ampicillin Other (See Comments)    Breaks out in bumps  . Bromday [Bromfenac Sodium] Itching and Swelling  . Tetanus Toxoids Other (See Comments)    Breaks out in bumps    Current Outpatient Prescriptions on File Prior to Visit  Medication Sig Dispense Refill  . clopidogrel (PLAVIX) 75 MG tablet Take 1 tablet (75 mg total) by mouth daily. 90 tablet 0  . glucose blood (TRUE METRIX BLOOD GLUCOSE TEST) test strip Use as instructed 4 x daily 150 each 11  . l-methylfolate-B6-B12 (METANX) 3-35-2 MG TABS Take 1 tablet by mouth 2 (two) times daily.    Marland Kitchen LEVEMIR FLEXTOUCH 100 UNIT/ML Pen Inject 30 Units into the skin at bedtime.     . Multiple Vitamin (ONE-A-DAY 55 PLUS PO) Take by mouth daily.    Marland Kitchen NOVOLOG FLEXPEN 100 UNIT/ML FlexPen Inject 10 Units into the skin 3 (three) times daily with meals.     . pantoprazole (PROTONIX) 40 MG tablet Take 1 tablet (40 mg total) by mouth daily. 90 tablet 3  . ranitidine (ZANTAC) 75 MG tablet Take 2 tablets (150 mg total) by mouth daily as needed for heartburn.    . simvastatin (ZOCOR) 40 MG tablet Take 40 mg by mouth every evening.    . vitamin C (ASCORBIC ACID) 500 MG tablet Take 500 mg by mouth daily.    . vitamin E (VITAMIN E) 400 UNIT capsule Take 400 Units by mouth daily.    Marland Kitchen  losartan (COZAAR) 25 MG tablet Take 25 mg by mouth daily. Reported on 10/15/2015     No current facility-administered medications on file prior to visit.        Objective:   Physical Exam Blood pressure 146/80, pulse 84, temperature 98 F (36.7 C), height  (1.499 m), weight 133 lb 14.4 oz (60.737 kg). Alert and oriented. Skin warm and dry. Oral mucosa is moist.   . Sclera anicteric, conjunctivae is pink. Thyroid not enlarged. No cervical lymphadenopathy. Lungs clear. Heart regular rate and rhythm.  Abdomen is soft. Bowel sounds are positive. No hepatomegaly. No abdominal masses felt. No  tenderness.  No edema to lower extremities.          Assessment & Plan:  GERD. Continue the Protonix and Zantac. Patient offered a colonoscopy today. She will let me know when she is ready.

## 2015-10-15 NOTE — Patient Instructions (Signed)
Protonix 30 minutes before breakfast. Zantac at bedtime.  OV in 1 year.

## 2015-10-20 ENCOUNTER — Telehealth: Payer: Self-pay | Admitting: Neurology

## 2015-10-20 NOTE — Telephone Encounter (Signed)
Rn call patient and she was in the shower. Rn talk to her husband that the eye surgery makes the decision about whether or not she should stop plavix. PTs husband will tell his wife.

## 2015-10-20 NOTE — Telephone Encounter (Signed)
Pt called said she is going to have lazer eye surgery for glaucoma suspect. She said this will relieve the pressure behind the eyes to prevent having to use drops 3-4 x day. She in inquiring if she needs to stop clopidogrel (PLAVIX) 75 MG tablet prior to surgery.

## 2015-10-22 DIAGNOSIS — H401131 Primary open-angle glaucoma, bilateral, mild stage: Secondary | ICD-10-CM | POA: Diagnosis not present

## 2015-11-03 DIAGNOSIS — H401131 Primary open-angle glaucoma, bilateral, mild stage: Secondary | ICD-10-CM | POA: Diagnosis not present

## 2015-11-03 DIAGNOSIS — E119 Type 2 diabetes mellitus without complications: Secondary | ICD-10-CM | POA: Diagnosis not present

## 2015-11-03 DIAGNOSIS — E103299 Type 1 diabetes mellitus with mild nonproliferative diabetic retinopathy without macular edema, unspecified eye: Secondary | ICD-10-CM | POA: Diagnosis not present

## 2015-11-03 LAB — HM DIABETES EYE EXAM

## 2015-11-06 ENCOUNTER — Ambulatory Visit (INDEPENDENT_AMBULATORY_CARE_PROVIDER_SITE_OTHER): Payer: Medicare Other | Admitting: Nurse Practitioner

## 2015-11-06 ENCOUNTER — Ambulatory Visit: Payer: Self-pay | Admitting: Neurology

## 2015-11-06 ENCOUNTER — Telehealth: Payer: Self-pay | Admitting: Nurse Practitioner

## 2015-11-06 ENCOUNTER — Encounter: Payer: Self-pay | Admitting: Nurse Practitioner

## 2015-11-06 VITALS — BP 150/82 | HR 93 | Ht 59.0 in | Wt 137.0 lb

## 2015-11-06 DIAGNOSIS — I10 Essential (primary) hypertension: Secondary | ICD-10-CM

## 2015-11-06 DIAGNOSIS — E7800 Pure hypercholesterolemia, unspecified: Secondary | ICD-10-CM

## 2015-11-06 DIAGNOSIS — E1059 Type 1 diabetes mellitus with other circulatory complications: Secondary | ICD-10-CM | POA: Diagnosis not present

## 2015-11-06 DIAGNOSIS — H53462 Homonymous bilateral field defects, left side: Secondary | ICD-10-CM | POA: Diagnosis not present

## 2015-11-06 MED ORDER — CLOPIDOGREL BISULFATE 75 MG PO TABS: 75.0000 mg | ORAL_TABLET | Freq: Every day | ORAL | Status: DC

## 2015-11-06 NOTE — Telephone Encounter (Signed)
According to our notes the last one was in April 2016 and had not changed from previous in 2014. She can have it repeated next April 2018 and PCP can set that up.

## 2015-11-06 NOTE — Patient Instructions (Signed)
Continue Plavix for secondary stroke prevention will refill for one year strict control of hypertension with blood pressure goal below 130/90,  diabetes with hemoglobin A1c goal below 6.5%  and lipids with LDL cholesterol goal below 100 mg/dL.  I also advised the patient not to drive due to persistent dense homonymous hemianopsia.  Continue exercises and stationary bike Healthy diet with fresh fruits and vegetables Discharge

## 2015-11-06 NOTE — Telephone Encounter (Signed)
Pt called said she forgot to remind Eber Jones she needs yearly carotoid doppler study of the rt side. If not at home, can leave on VM.

## 2015-11-06 NOTE — Telephone Encounter (Signed)
I called pt and LMVM for her on home # of the message below.  Pt is to call back if questions.

## 2015-11-06 NOTE — Progress Notes (Signed)
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  She returns for followup of the last visit on 06/14/12. She states she'll doing well she's had no recurrent stroke or TIA symptoms. She is tolerating plavix without significant bleeding but does complain of mild bruising. She states her sugars have all been under good control. She had profile checked by her primary physician a few months ago and was apparently unremarkable though I do not have those results. She states her blood pressure is also under good control. She's continues to have peripheral vision loss in the left and has not been driving. She had a brief episode of dizziness and blurred vision 2 weeks ago which lasted couple of days. She was seen at [facility] emergency room where CT head showed no acute abnormalities. She denied any accompanying headache or focal neurological symptoms. She was seen by ophthalmologist and diagnosed with glaucoma and plans treatment of that stone. Update 12/03/2014 : She returns for follow-up accompanied by husband after last visit a year and half ago. She continues to do well without recurrent stroke or TIA symptoms following her original stroke in August 2012. She remains frustrated with lack of improvement in her peripheral vision and inability to drive. She still has some mild gait difficulties due to her arthritis. She does take Aleve occasionally and is worried about including stroke and bleeding risk. She remains on Plavix which is tolerating well with only minor bruising and no bleeding complications. Her sugars have not  been well controlled and she is seeing her endocrinologist in [place] and last hemoglobin A1c was 8.1. Blood pressure remains quite well controlled at home but she does have some white coat hypertension. She has been having lipid profile checked every 3 months by her endocrinologist and has been under control. Her carotid ultrasound done on 08/14/13 in our office which I have personally reviewed and showed no significant extracranial stenosis.  UPDATE 11/06/2015 Ms. Scifres, 70 year old female returns for follow-up. She has a history of stroke August 2012 without recurrent stroke or TIA symptoms since that time. She remains on Plavix for secondary stroke prevention she continues to have problems with her peripheral vision and inability inability to drive. She has mild gait abnormalities due to her arthritis. She has seen a endocrinologist in [place] for her diabetes as her blood sugars have not been well controlled.Last hemoglobin A1c 8.1 in December 2016She claims she has white coat hypertension but checks her blood pressure at home on a weekly basis and is always within normal range. Lipids are followed by her endocrinologist as well. Last carotid Doppler 12/25/14 without significant extracranial stenosis. She rides a stationary bike 30 minutes every day.She returns for reevaluation  REVIEW OF SYSTEMS: Full 14 system review of systems performed and notable only for those listed, all others are neg:  Constitutional: neg  Cardiovascular: neg Ear/Nose/Throat: neg  Skin: neg Eyes: blurred vision Respiratory: neg Gastroitestinal: neg  Hematology/Lymphatic: neg  Endocrine: neg Musculoskeletal:neg Allergy/Immunology: neg Neurological: neg Psychiatric: neg Sleep : neg   ALLERGIES: Allergies  Allergen Reactions  . Ampicillin Other (See Comments)    Breaks out in bumps  . Bromday [Bromfenac Sodium]  Itching and Swelling  . Tetanus Toxoids Other (See Comments)    Breaks out in bumps    HOME  MEDICATIONS: Outpatient Prescriptions Prior to Visit  Medication Sig Dispense Refill  . glucose blood (TRUE METRIX BLOOD GLUCOSE TEST) test strip Use as instructed 4 x daily 150 each 11  . l-methylfolate-B6-B12 (METANX) 3-35-2 MG TABS Take 1 tablet by mouth 2 (two) times daily.    Marland Kitchen LEVEMIR FLEXTOUCH 100 UNIT/ML Pen Inject 30 Units into the skin at bedtime.     Marland Kitchen losartan (COZAAR) 25 MG tablet Take 25 mg by mouth daily. Reported on 10/15/2015    . Multiple Vitamin (ONE-A-DAY 55 PLUS PO) Take by mouth daily.    Marland Kitchen NOVOLOG FLEXPEN 100 UNIT/ML FlexPen Inject 10 Units into the skin 3 (three) times daily with meals.     . pantoprazole (PROTONIX) 40 MG tablet Take 1 tablet (40 mg total) by mouth daily. 90 tablet 3  . ranitidine (ZANTAC) 75 MG tablet Take 2 tablets (150 mg total) by mouth daily as needed for heartburn.    . simvastatin (ZOCOR) 40 MG tablet Take 40 mg by mouth every evening.    . vitamin C (ASCORBIC ACID) 500 MG tablet Take 500 mg by mouth daily.    . vitamin E (VITAMIN E) 400 UNIT capsule Take 400 Units by mouth daily.    . clopidogrel (PLAVIX) 75 MG tablet Take 1 tablet (75 mg total) by mouth daily. 90 tablet 0   No facility-administered medications prior to visit.    PAST MEDICAL HISTORY: Past Medical History  Diagnosis Date  . Diabetes mellitus     since 2008  . Hypercholesteremia   . GERD (gastroesophageal reflux disease)   . 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 Arthritis   . Hyperlipidemia   . Essential hypertension, benign 09/10/2015    PAST SURGICAL HISTORY: Past Surgical History  Procedure Laterality Date  . Eye surgery  10-2010    left eye removal of cataract  . Tubal ligation  1984  . Cataract extraction w/phaco  11/01/2011    Procedure: CATARACT EXTRACTION PHACO AND INTRAOCULAR LENS PLACEMENT (IOC);  Surgeon: Gemma Payor, MD;  Location: [facility];  Service: Ophthalmology;  Laterality: Right;  CDE 12.48  . Esophagogastroduodenoscopy (egd) with  esophageal dilation N/A 02/14/2013    Procedure: ESOPHAGOGASTRODUODENOSCOPY (EGD) WITH ESOPHAGEAL DILATION;  Surgeon: Malissa Hippo, MD;  Location: [facility];  Service: Endoscopy;  Laterality: N/A;  200-moved to 255 Ann to notify pt    FAMILY HISTORY: Family History  Problem Relation Age of Onset  . Anesthesia problems Neg Hx   . Hypotension Neg Hx   . Malignant hyperthermia Neg Hx   . Pseudochol deficiency Neg Hx   . Cancer Mother   . Cancer Father     SOCIAL HISTORY: Social History   Social History  . Marital Status: Married    Spouse Name: wayne  . Number of Children: 2  . 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 Smoking status: Never Smoker   . Smokeless tobacco: Never Used  . Alcohol Use: No  . Drug Use: No  . Sexual Activity: Yes    Birth Control/ Protection: None   Other Topics Concern  . Not on file   Social History Narrative   Patient is married with 2 children.   Patient is right handed.  Patient has hs education.   Patient drinks 3-5 glasses of tea daily.     PHYSICAL EXAM  Filed Vitals:   11/06/15 1255  BP: 150/82  Pulse: 93  Height:  (1.499 m)  Weight: 137 lb (62.143 kg)   Body mass index is 27.66 kg/(m^2). General: well developed, well nourished, seated, in no evident distress Head: head normocephalic and atraumatic.  Neck: supple with no carotid or supraclavicular bruits Cardiovascular: regular rate and rhythm, no murmurs Musculoskeletal slight deformity fingers from osteoarthritis bilaterally  Neurologic Exam Mental Status: Awake and fully alert. Oriented to place and time. Recent and remote memory intact. Attention span, concentration and fund of knowledge appropriate. Mood and affect appropriate.  Cranial Nerves: Fundoscopic exam not done . Pupils equal, briskly reactive to light. Extraocular movements full without nystagmus. Visual fields  show dense left homonymous hemianopia to confrontation. Hearing intact .Facial sensation intact. Face, tongue, palate move normally and symmetrically. Neck flexion and extension normal.  Motor: Normal bulk and tone. Normal strength in all tested extremity muscles. Sensory.: intact to touch and pinprick and vibratory sensation.  Coordination: Rapid alternating movements normal in all extremities. Finger-to-nose and heel-to-shin performed accurately bilaterally. Gait and Station: Arises from chair without difficulty. Stance is normal. Gait demonstrates normal stride length and balance but favors right knee due to pain . Able to heel, toe and tandem walk with mild difficulty. No assistive device Reflexes: 1+ and symmetric. Toes downgoing.    DIAGNOSTIC DATA (LABS, IMAGING, TESTING)   Lab Results  Component Value Date   HGBA1C 8.1 09/03/2015    ASSESSMENT AND PLAN  70 y.o. year old female  has a past medical history of Diabetes mellitus; Hypercholesteremia; GERD (gastroesophageal reflux disease); Stroke [facility]) (03/2011); Arthritis; Hyperlipidemia; and Essential hypertension, benign (09/10/2015). here to follow up.   PLANContinue Plavix for secondary stroke prevention will refill for one year then PCP can fill strict control of hypertension with blood pressure goal below 130/90, continue to monitor at home diabetes with hemoglobin A1c goal below 6.5% most recent 8.1 in Dec 2016  lipids with LDL cholesterol goal below 100 mg/dL.followed by endocrinology  I also advised the patient not to drive due to persistent dense homonymous hemianopsia.  Continue exercises and stationary bike every day Healthy diet with fresh fruits and vegetables If recurrent stroke or TIA symptoms call 911 and proceed to the hospital Discharge  Nilda Riggs, [facility], [facility], APRN  [facility] [address] [place], [place] [address] [phone]

## 2015-11-07 ENCOUNTER — Telehealth (INDEPENDENT_AMBULATORY_CARE_PROVIDER_SITE_OTHER): Payer: Self-pay | Admitting: Internal Medicine

## 2015-11-07 NOTE — Telephone Encounter (Signed)
Nancy Sutton called saying during her last appointment with Terri, she was told she needs to have her thyroid checked. In September Dr. Fransico Him checked her thyroid and everything looks fine according to the patient. She'll have it checked again in March by Dr. Fransico Him. Please call the pt if needed.  Pt's ph# [phone] Thank you.

## 2015-11-11 NOTE — Telephone Encounter (Signed)
Noted  

## 2015-11-27 ENCOUNTER — Other Ambulatory Visit: Payer: Self-pay

## 2015-11-27 MED ORDER — NOVOLOG FLEXPEN 100 UNIT/ML ~~LOC~~ SOPN: 12.0000 [IU] | PEN_INJECTOR | Freq: Three times a day (TID) | SUBCUTANEOUS | Status: DC

## 2015-12-01 DIAGNOSIS — H401131 Primary open-angle glaucoma, bilateral, mild stage: Secondary | ICD-10-CM | POA: Diagnosis not present

## 2015-12-09 ENCOUNTER — Other Ambulatory Visit: Payer: Self-pay | Admitting: "Endocrinology

## 2015-12-09 DIAGNOSIS — Z1389 Encounter for screening for other disorder: Secondary | ICD-10-CM | POA: Diagnosis not present

## 2015-12-09 DIAGNOSIS — E1059 Type 1 diabetes mellitus with other circulatory complications: Secondary | ICD-10-CM | POA: Diagnosis not present

## 2015-12-09 DIAGNOSIS — E1065 Type 1 diabetes mellitus with hyperglycemia: Secondary | ICD-10-CM

## 2015-12-09 DIAGNOSIS — K219 Gastro-esophageal reflux disease without esophagitis: Secondary | ICD-10-CM | POA: Diagnosis not present

## 2015-12-09 DIAGNOSIS — E1069 Type 1 diabetes mellitus with other specified complication: Secondary | ICD-10-CM

## 2015-12-09 DIAGNOSIS — IMO0002 Reserved for concepts with insufficient information to code with codable children: Secondary | ICD-10-CM

## 2015-12-09 DIAGNOSIS — I6523 Occlusion and stenosis of bilateral carotid arteries: Secondary | ICD-10-CM | POA: Diagnosis not present

## 2015-12-09 DIAGNOSIS — Z0001 Encounter for general adult medical examination with abnormal findings: Secondary | ICD-10-CM | POA: Diagnosis not present

## 2015-12-09 DIAGNOSIS — R5383 Other fatigue: Secondary | ICD-10-CM

## 2015-12-09 DIAGNOSIS — Z6825 Body mass index (BMI) 25.0-25.9, adult: Secondary | ICD-10-CM | POA: Diagnosis not present

## 2015-12-09 LAB — BASIC METABOLIC PANEL
BUN: 25 mg/dL (ref 7–25)
CHLORIDE: 104 mmol/L (ref 98–110)
CO2: 26 mmol/L (ref 20–31)
Calcium: 9.2 mg/dL (ref 8.6–10.4)
Creat: 0.81 mg/dL (ref 0.60–0.93)
Glucose, Bld: 219 mg/dL — ABNORMAL HIGH (ref 65–99)
POTASSIUM: 4.7 mmol/L (ref 3.5–5.3)
SODIUM: 139 mmol/L (ref 135–146)

## 2015-12-10 LAB — HEMOGLOBIN A1C
HEMOGLOBIN A1C: 7.5 % — AB (ref <5.7)
Mean Plasma Glucose: 169 mg/dL — ABNORMAL HIGH (ref <117)

## 2015-12-11 LAB — T4, FREE: FREE T4: 1.2 ng/dL (ref 0.8–1.8)

## 2015-12-15 LAB — TSH: TSH: 1.12 m[IU]/L

## 2015-12-17 ENCOUNTER — Ambulatory Visit (INDEPENDENT_AMBULATORY_CARE_PROVIDER_SITE_OTHER): Payer: Medicare Other | Admitting: "Endocrinology

## 2015-12-17 ENCOUNTER — Encounter: Payer: Self-pay | Admitting: "Endocrinology

## 2015-12-17 VITALS — BP 146/71 | HR 77 | Ht 59.0 in | Wt 134.0 lb

## 2015-12-17 DIAGNOSIS — E1059 Type 1 diabetes mellitus with other circulatory complications: Secondary | ICD-10-CM | POA: Diagnosis not present

## 2015-12-17 DIAGNOSIS — E7800 Pure hypercholesterolemia, unspecified: Secondary | ICD-10-CM

## 2015-12-17 DIAGNOSIS — I10 Essential (primary) hypertension: Secondary | ICD-10-CM | POA: Diagnosis not present

## 2015-12-17 MED ORDER — NOVOLOG FLEXPEN 100 UNIT/ML ~~LOC~~ SOPN: 10.0000 [IU] | PEN_INJECTOR | Freq: Three times a day (TID) | SUBCUTANEOUS | Status: DC

## 2015-12-17 NOTE — Progress Notes (Signed)
Subjective:    Patient ID: Nancy Sutton, female    DOB: [birth year], PCP Cassell SmilesFUSCO,LAWRENCE J., MD   Past Medical History  Diagnosis Date  . Diabetes mellitus     since 2008  . Hypercholesteremia   . GERD (gastroesophageal reflux disease)   . 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 Arthritis   . Hyperlipidemia   . Essential hypertension, benign 09/10/2015   Past Surgical History  Procedure Laterality Date  . Eye surgery  10-2010    left eye removal of cataract  . Tubal ligation  1984  . Cataract extraction w/phaco  11/01/2011    Procedure: CATARACT EXTRACTION PHACO AND INTRAOCULAR LENS PLACEMENT (IOC);  Surgeon: Gemma PayorKerry Hunt, MD;  Location: [facility];  Service: Ophthalmology;  Laterality: Right;  CDE 12.48  . Esophagogastroduodenoscopy (egd) with esophageal dilation N/A 02/14/2013    Procedure: ESOPHAGOGASTRODUODENOSCOPY (EGD) WITH ESOPHAGEAL DILATION;  Surgeon: Malissa HippoNajeeb U Rehman, MD;  Location: [facility];  Service: Endoscopy;  Laterality: N/A;  200-moved to 255 Ann to notify pt   Social History   Social History  . Marital Status: Married    Spouse Name: wayne  . Number of Children: 2  . 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 Smoking status: Never Smoker   . Smokeless tobacco: Never Used  . Alcohol Use: No  . Drug Use: No  . Sexual Activity: Yes    Birth Control/ Protection: None   Other Topics Concern  . None   Social History Narrative   Patient is married with 2 children.   Patient is right handed.   Patient has hs education.   Patient drinks 3-5 glasses of tea daily.   Outpatient Encounter Prescriptions as of 12/17/2015  Medication Sig  . clopidogrel (PLAVIX) 75 MG tablet Take 1 tablet (75 mg total) by mouth daily.  Marland Kitchen. glucose blood (TRUE METRIX BLOOD GLUCOSE TEST) test strip Use as instructed 4 x daily  . l-methylfolate-B6-B12 (METANX) 3-35-2 MG TABS Take 1  tablet by mouth 2 (two) times daily.  Marland Kitchen. LEVEMIR FLEXTOUCH 100 UNIT/ML Pen Inject 30 Units into the skin at bedtime.   Marland Kitchen. losartan (COZAAR) 25 MG tablet Take 25 mg by mouth daily. Reported on 10/15/2015  . Multiple Vitamin (ONE-A-DAY 55 PLUS PO) Take by mouth daily.  Marland Kitchen. NOVOLOG FLEXPEN 100 UNIT/ML FlexPen Inject 10-16 Units into the skin 3 (three) times daily with meals.  . pantoprazole (PROTONIX) 40 MG tablet Take 1 tablet (40 mg total) by mouth daily.  . ranitidine (ZANTAC) 75 MG tablet Take 2 tablets (150 mg total) by mouth daily as needed for heartburn.  . simvastatin (ZOCOR) 40 MG tablet Take 40 mg by mouth every evening.  . vitamin C (ASCORBIC ACID) 500 MG tablet Take 500 mg by mouth daily.  . vitamin E (VITAMIN E) 400 UNIT capsule Take 400 Units by mouth daily.  . [DISCONTINUED] NOVOLOG FLEXPEN 100 UNIT/ML FlexPen Inject 12-18 Units into the skin 3 (three) times daily with meals.   No facility-administered encounter medications on file as of 12/17/2015.   ALLERGIES: Allergies  Allergen Reactions  . Ampicillin Other (See Comments)    Breaks out in bumps  . Bromday [Bromfenac Sodium] Itching and Swelling  . Tetanus Toxoids Other (See Comments)    Breaks out in bumps   VACCINATION STATUS: Immunization History  Administered Date(s) Administered  .  [place], IM 02/13/2014, 02/16/2014, 02/20/2014, 02/27/2014    Diabetes She presents for her follow-up diabetic visit. She has type 1 diabetes mellitus. Onset time: She was diagnosed at approximate age of 2 years. Her disease course has been worsening. There are no hypoglycemic associated symptoms. Pertinent negatives for hypoglycemia include no confusion, headaches, pallor or seizures. There are no diabetic associated symptoms. Pertinent negatives for diabetes include no chest pain, no polydipsia, no polyphagia and no polyuria. There are no hypoglycemic complications. Symptoms are worsening. Diabetic complications include a CVA. Current diabetic  treatment includes intensive insulin program. She is compliant with treatment some of the time. She is following a generally unhealthy diet. When asked about meal planning, she reported none. She has had a previous visit with a dietitian. She never participates in exercise. Her home blood glucose trend is increasing steadily. Her breakfast blood glucose range is generally 180-200 mg/dl. Her lunch blood glucose range is generally 180-200 mg/dl. Her dinner blood glucose range is generally 180-200 mg/dl. Her overall blood glucose range is 180-200 mg/dl. An ACE inhibitor/angiotensin II receptor blocker is being taken.  Hyperlipidemia This is a chronic problem. The current episode started more than 1 year ago. Exacerbating diseases include diabetes. Pertinent negatives include no chest pain, myalgias or shortness of breath. Current antihyperlipidemic treatment includes statins. Risk factors for coronary artery disease include diabetes mellitus, dyslipidemia, hypertension and a sedentary lifestyle.  Hypertension This is a chronic problem. The current episode started more than 1 year ago. Pertinent negatives include no chest pain, headaches, palpitations or shortness of breath. Past treatments include ACE inhibitors. Hypertensive end-organ damage includes CVA.     Review of Systems  Constitutional: Negative for fever, chills and unexpected weight change.  HENT: Negative for trouble swallowing and voice change.   Eyes: Negative for visual disturbance.  Respiratory: Negative for cough, shortness of breath and wheezing.   Cardiovascular: Negative for chest pain, palpitations and leg swelling.  Gastrointestinal: Negative for nausea, vomiting and diarrhea.  Endocrine: Negative for cold intolerance, heat intolerance, polydipsia, polyphagia and polyuria.  Musculoskeletal: Negative for myalgias and arthralgias.  Skin: Negative for color change, pallor, rash and wound.  Neurological: Negative for seizures and  headaches.  Psychiatric/Behavioral: Negative for suicidal ideas and confusion.    Objective:    BP 146/71 mmHg  Pulse 77  Ht  (1.499 m)  Wt 134 lb (60.782 kg)  BMI 27.05 kg/m2  SpO2 98%  Wt Readings from Last 3 Encounters:  12/17/15 134 lb (60.782 kg)  11/06/15 137 lb (62.143 kg)  10/15/15 133 lb 14.4 oz (60.737 kg)    Physical Exam  Constitutional: She is oriented to person, place, and time. She appears well-developed.  HENT:  Head: Normocephalic and atraumatic.  Eyes: EOM are normal.  Neck: Normal range of motion. Neck supple. No tracheal deviation present. No thyromegaly present.  Cardiovascular: Normal rate and regular rhythm.   Pulmonary/Chest: Effort normal and breath sounds normal.  Abdominal: Soft. Bowel sounds are normal. There is no tenderness. There is no guarding.  Musculoskeletal: Normal range of motion. She exhibits no edema.  Neurological: She is alert and oriented to person, place, and time. She has normal reflexes. No cranial nerve deficit. Coordination normal.  Skin: Skin is warm and dry. No rash noted. No erythema. No pallor.  Psychiatric: She has a normal mood and affect. Judgment normal.     Lab Results  Component Value Date   WBC 10.9* 02/13/2014   HGB 11.3* 02/13/2014   HCT  33.4* 02/13/2014   MCV 88.8 02/13/2014   PLT 225 02/13/2014   Chemistry (most recent): Lab Results  Component Value Date   NA 139 12/09/2015   K 4.7 12/09/2015   CL 104 12/09/2015   CO2 26 12/09/2015   BUN 25 12/09/2015   CREATININE 0.81 12/09/2015   Diabetic Labs (most recent): Lab Results  Component Value Date   HGBA1C 7.5* 12/09/2015   HGBA1C 8.1 09/03/2015   HGBA1C 8.8* 04/30/2011   Lipid Panel     Component Value Date/Time   CHOL 206* 05/01/2011 0540   TRIG 304* 05/01/2011 0540   HDL 50 05/01/2011 0540   CHOLHDL 4.1 05/01/2011 0540   VLDL 61* 05/01/2011 0540   LDLCALC 95 05/01/2011 0540     Assessment & Plan:   1. 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 These are all discussed in detail with the patient.  Patient came with fluctuating glucose profile, and  recent A1c  improved to 7.5% from  8.1 %. This is due to her inconsistent meals habit and pattern and unable to avoid simple carbohydrates like ice cream.  Glucose logs and insulin administration records pertaining to this visit,  to be scanned into patient's records.  Recent labs reviewed.   - I have re-counseled the patient on diet management   by adopting a carbohydrate restricted / protein rich  Diet.  - Suggestion is made for patient to avoid simple carbohydrates   from their diet including Cakes , Desserts, Ice Cream,  Soda (  diet and regular) , Sweet Tea , Candies,  Chips, Cookies, Artificial Sweeteners,   and "Sugar-free" Products .  This will help patient to have stable blood glucose profile and potentially avoid unintended  Weight gain.  - Patient is advised to stick to a routine mealtimes to eat 3 meals  a day and avoid unnecessary snacks ( to snack only to correct hypoglycemia).  - The patient  has been  scheduled with Norm Salt, RDN, CDE for individualized DM education.  - I have approached patient with the following individualized plan to manage diabetes and patient agrees.  - I will continue Levemir 32 units qhs,  Novolog 10 units TIDAC plus SSI.  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 -Patient is not a candidate for metformin,SGLT2 inhibitors, and incretin therapy.  - Patient specific target  for A1c; LDL, HDL, Triglycerides, and  Waist Circumference were discussed in detail.  2) BP/HTN: Uncontrolled, patient claims she has  controlled blood pressure at home. Continue current medications including ACEI/ARB. 3) Lipids/HPL:  continue statins. 4)  Weight/Diet: CDE consult in progress, exercise, and carbohydrates information provided.  5) Chronic Care/Health Maintenance:  -Patient is on ACEI/ARB and Statin medications and encouraged to continue to follow up with Ophthalmology, Podiatrist at least yearly or according to recommendations, and advised to  stay away from smoking. I have recommended yearly flu vaccine and pneumonia vaccination at least every 5 years; moderate intensity exercise for up to 150 minutes weekly; and  sleep for at least 7 hours a day.  - 25 minutes of time was spent on the care of this patient , 50% of which was applied for counseling on diabetes  complications and their preventions.  - I advised patient to maintain close follow up with Cassell Smiles., MD for primary care needs.  Patient is asked to bring meter and  blood glucose logs during their next visit.   Follow up plan: -Return in about 3 months (around 03/18/2016) for diabetes, high blood pressure, high cholesterol, follow up with pre-visit labs, meter, and logs.  Marquis Lunch, MD Phone: [phone]  Fax: [phone]   12/17/2015, 4:20 PM

## 2015-12-26 DIAGNOSIS — H6123 Impacted cerumen, bilateral: Secondary | ICD-10-CM | POA: Diagnosis not present

## 2015-12-29 ENCOUNTER — Other Ambulatory Visit: Payer: Self-pay | Admitting: "Endocrinology

## 2015-12-30 DIAGNOSIS — E119 Type 2 diabetes mellitus without complications: Secondary | ICD-10-CM | POA: Diagnosis not present

## 2015-12-30 DIAGNOSIS — I10 Essential (primary) hypertension: Secondary | ICD-10-CM | POA: Diagnosis not present

## 2015-12-30 DIAGNOSIS — Z8673 Personal history of transient ischemic attack (TIA), and cerebral infarction without residual deficits: Secondary | ICD-10-CM | POA: Diagnosis not present

## 2015-12-30 DIAGNOSIS — E7800 Pure hypercholesterolemia, unspecified: Secondary | ICD-10-CM | POA: Diagnosis not present

## 2016-01-14 DIAGNOSIS — H401131 Primary open-angle glaucoma, bilateral, mild stage: Secondary | ICD-10-CM | POA: Diagnosis not present

## 2016-01-20 ENCOUNTER — Encounter: Payer: Self-pay | Admitting: "Endocrinology

## 2016-03-08 ENCOUNTER — Other Ambulatory Visit: Payer: Self-pay

## 2016-03-08 ENCOUNTER — Other Ambulatory Visit: Payer: Self-pay | Admitting: "Endocrinology

## 2016-03-08 MED ORDER — NOVOLOG FLEXPEN 100 UNIT/ML ~~LOC~~ SOPN
50.0000 [IU] | PEN_INJECTOR | Freq: Every day | SUBCUTANEOUS | Status: DC
Start: 2016-03-08 — End: 2016-03-24

## 2016-03-08 MED ORDER — NOVOLOG FLEXPEN 100 UNIT/ML ~~LOC~~ SOPN: 10.0000 [IU] | PEN_INJECTOR | Freq: Three times a day (TID) | SUBCUTANEOUS | Status: DC

## 2016-03-17 ENCOUNTER — Other Ambulatory Visit: Payer: Self-pay | Admitting: "Endocrinology

## 2016-03-17 DIAGNOSIS — E1059 Type 1 diabetes mellitus with other circulatory complications: Secondary | ICD-10-CM | POA: Diagnosis not present

## 2016-03-17 LAB — BASIC METABOLIC PANEL
BUN: 21 mg/dL (ref 7–25)
CHLORIDE: 102 mmol/L (ref 98–110)
CO2: 26 mmol/L (ref 20–31)
CREATININE: 0.9 mg/dL (ref 0.60–0.93)
Calcium: 9.3 mg/dL (ref 8.6–10.4)
Glucose, Bld: 210 mg/dL — ABNORMAL HIGH (ref 65–99)
POTASSIUM: 4.5 mmol/L (ref 3.5–5.3)
Sodium: 140 mmol/L (ref 135–146)

## 2016-03-18 LAB — HEMOGLOBIN A1C
HEMOGLOBIN A1C: 7 % — AB (ref <5.7)
MEAN PLASMA GLUCOSE: 154 mg/dL

## 2016-03-24 ENCOUNTER — Ambulatory Visit (INDEPENDENT_AMBULATORY_CARE_PROVIDER_SITE_OTHER): Payer: Medicare Other | Admitting: "Endocrinology

## 2016-03-24 ENCOUNTER — Encounter: Payer: Self-pay | Admitting: "Endocrinology

## 2016-03-24 VITALS — BP 132/73 | HR 92 | Ht 59.0 in | Wt 134.0 lb

## 2016-03-24 DIAGNOSIS — E1059 Type 1 diabetes mellitus with other circulatory complications: Secondary | ICD-10-CM

## 2016-03-24 DIAGNOSIS — I10 Essential (primary) hypertension: Secondary | ICD-10-CM | POA: Diagnosis not present

## 2016-03-24 DIAGNOSIS — E785 Hyperlipidemia, unspecified: Secondary | ICD-10-CM

## 2016-03-24 NOTE — Progress Notes (Signed)
Subjective:    Patient ID: Nancy Sutton, female    DOB: 1945/11/06, PCP Nancy Sutton,Nancy J., Nancy Sutton   Past Medical History  Diagnosis Date  . Diabetes mellitus     since 2008  . Hypercholesteremia   . GERD (gastroesophageal reflux disease)   . 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 Arthritis   . Hyperlipidemia   . Essential hypertension, benign 09/10/2015   Past Surgical History  Procedure Laterality Date  . Eye surgery  10-2010    left eye removal of cataract  . Tubal ligation  1984  . Cataract extraction w/phaco  11/01/2011    Procedure: CATARACT EXTRACTION PHACO AND INTRAOCULAR LENS PLACEMENT (IOC);  Surgeon: Gemma PayorKerry Hunt, Nancy Sutton;  Location: [facility];  Service: Ophthalmology;  Laterality: Right;  CDE 12.48  . Esophagogastroduodenoscopy (egd) with esophageal dilation N/A 02/14/2013    Procedure: ESOPHAGOGASTRODUODENOSCOPY (EGD) WITH ESOPHAGEAL DILATION;  Surgeon: Malissa HippoNajeeb U Rehman, Nancy Sutton;  Location: [facility];  Service: Endoscopy;  Laterality: N/A;  200-moved to 255 Ann to notify pt   Social History   Social History  . Marital Status: Married    Spouse Name: wayne  . Number of Children: 2  . 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 Smoking status: Never Smoker   . Smokeless tobacco: Never Used  . Alcohol Use: No  . Drug Use: No  . Sexual Activity: Yes    Birth Control/ Protection: None   Other Topics Concern  . None   Social History Narrative   Patient is married with 2 children.   Patient is right handed.   Patient has hs education.   Patient drinks 3-5 glasses of tea daily.   Outpatient Encounter Prescriptions as of 03/24/2016  Medication Sig  . Insulin Aspart (NOVOLOG FLEXPEN [place]) Inject 10-16 Units into the skin 3 (three) times daily with meals.  . Insulin Detemir (LEVEMIR FLEXTOUCH [place]) Inject 32 Units into the skin.  Marland Kitchen. clopidogrel (PLAVIX) 75 MG tablet Take 1 tablet  (75 mg total) by mouth daily.  Marland Kitchen. GLOBAL EASE INJECT PEN NEEDLES 32G X 4 MM MISC USE 4 PENTIPS EVERY DAY  . glucose blood (TRUE METRIX BLOOD GLUCOSE TEST) test strip Use as instructed 4 x daily  . l-methylfolate-B6-B12 (METANX) 3-35-2 MG TABS Take 1 tablet by mouth 2 (two) times daily.  Marland Kitchen. losartan (COZAAR) 25 MG tablet Take 25 mg by mouth daily. Reported on 10/15/2015  . Multiple Vitamin (ONE-A-DAY 55 PLUS PO) Take by mouth daily.  . pantoprazole (PROTONIX) 40 MG tablet Take 1 tablet (40 mg total) by mouth daily.  . ranitidine (ZANTAC) 75 MG tablet Take 2 tablets (150 mg total) by mouth daily as needed for heartburn.  . simvastatin (ZOCOR) 40 MG tablet Take 40 mg by mouth every evening.  . vitamin C (ASCORBIC ACID) 500 MG tablet Take 500 mg by mouth daily.  . vitamin E (VITAMIN E) 400 UNIT capsule Take 400 Units by mouth daily.  . [DISCONTINUED] Insulin Detemir (LEVEMIR FLEXTOUCH) 100 UNIT/ML Pen Inject 50 Units into the skin at bedtime.  . [DISCONTINUED] NOVOLOG FLEXPEN 100 UNIT/ML FlexPen Inject 50 Units into the skin daily.   No facility-administered encounter medications on file as of 03/24/2016.   ALLERGIES: Allergies  Allergen Reactions  . Ampicillin Other (See Comments)    Breaks out in bumps  . Bromday [Bromfenac Sodium] Itching and  Swelling  . Tetanus Toxoids Other (See Comments)    Breaks out in bumps   VACCINATION STATUS: Immunization History  Administered Date(s) Administered  . Rabies, IM 02/13/2014, 02/16/2014, 02/20/2014, 02/27/2014    Diabetes She presents for her follow-up diabetic visit. She has type 1 diabetes mellitus. Onset time: She was diagnosed at approximate age of 70 years. Her disease course has been improving. There are no hypoglycemic associated symptoms. Pertinent negatives for hypoglycemia include no confusion, headaches, pallor or seizures. There are no diabetic associated symptoms. Pertinent negatives for diabetes include no chest pain, no polydipsia, no  polyphagia and no polyuria. There are no hypoglycemic complications. Symptoms are improving. Diabetic complications include a CVA. Current diabetic treatment includes intensive insulin program. She is compliant with treatment some of the time. Her weight is stable. She is following a generally unhealthy diet. When asked about meal planning, she reported none. She has had a previous visit with a dietitian. She never participates in exercise. Her home blood glucose trend is decreasing steadily. Her breakfast blood glucose range is generally 140-180 mg/dl. Her lunch blood glucose range is generally 140-180 mg/dl. Her dinner blood glucose range is generally 140-180 mg/dl. Her overall blood glucose range is 140-180 mg/dl. An ACE inhibitor/angiotensin II receptor blocker is being taken.  Hyperlipidemia This is a chronic problem. The current episode started more than 1 year ago. Exacerbating diseases include diabetes. Pertinent negatives include no chest pain, myalgias or shortness of breath. Current antihyperlipidemic treatment includes statins. Risk factors for coronary artery disease include diabetes mellitus, dyslipidemia, hypertension and a sedentary lifestyle.  Hypertension This is a chronic problem. The current episode started more than 1 year ago. Pertinent negatives include no chest pain, headaches, palpitations or shortness of breath. Past treatments include ACE inhibitors. Hypertensive end-organ damage includes CVA.     Review of Systems  Constitutional: Negative for fever, chills and unexpected weight change.  HENT: Negative for trouble swallowing and voice change.   Eyes: Negative for visual disturbance.  Respiratory: Negative for cough, shortness of breath and wheezing.   Cardiovascular: Negative for chest pain, palpitations and leg swelling.  Gastrointestinal: Negative for nausea, vomiting and diarrhea.  Endocrine: Negative for cold intolerance, heat intolerance, polydipsia, polyphagia and  polyuria.  Musculoskeletal: Negative for myalgias and arthralgias.  Skin: Negative for color change, pallor, rash and wound.  Neurological: Negative for seizures and headaches.  Psychiatric/Behavioral: Negative for suicidal ideas and confusion.    Objective:    BP 132/73 mmHg  Pulse 92  Ht 4\' 11"  (1.499 m)  Wt 134 lb (60.782 kg)  BMI 27.05 kg/m2  SpO2 95%  Wt Readings from Last 3 Encounters:  03/24/16 134 lb (60.782 kg)  12/17/15 134 lb (60.782 kg)  11/06/15 137 lb (62.143 kg)    Physical Exam  Constitutional: She is oriented to person, place, and time. She appears well-developed.  HENT:  Head: Normocephalic and atraumatic.  Eyes: EOM are normal.  Neck: Normal range of motion. Neck supple. No tracheal deviation present. No thyromegaly present.  Cardiovascular: Normal rate and regular rhythm.   Pulmonary/Chest: Effort normal and breath sounds normal.  Abdominal: Soft. Bowel sounds are normal. There is no tenderness. There is no guarding.  Musculoskeletal: Normal range of motion. She exhibits no edema.  Neurological: She is alert and oriented to person, place, and time. She has normal reflexes. No cranial nerve deficit. Coordination normal.  Skin: Skin is warm and dry. No rash noted. No erythema. No pallor.  Psychiatric: She has a  normal mood and affect. Judgment normal.     Lab Results  Component Value Date   WBC 10.9* 02/13/2014   HGB 11.3* 02/13/2014   HCT 33.4* 02/13/2014   MCV 88.8 02/13/2014   PLT 225 02/13/2014   Chemistry (most recent): Lab Results  Component Value Date   NA 140 03/17/2016   K 4.5 03/17/2016   CL 102 03/17/2016   CO2 26 03/17/2016   BUN 21 03/17/2016   CREATININE 0.90 03/17/2016   Diabetic Labs (most recent): Lab Results  Component Value Date   HGBA1C 7.0* 03/17/2016   HGBA1C 7.5* 12/09/2015   HGBA1C 8.1 09/03/2015   Lipid Panel     Component Value Date/Time   CHOL 206* 05/01/2011 0540   TRIG 304* 05/01/2011 0540   HDL 50  05/01/2011 0540   CHOLHDL 4.1 05/01/2011 0540   VLDL 61* 05/01/2011 0540   LDLCALC 95 05/01/2011 0540     Assessment & Plan:   1. 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 These are all discussed in detail with the patient.  Patient came with Improving glucose profile, and  recent A1c  improved to 7% from  8.1 %. She still has inconsistent meals habit and pattern and unable to avoid simple carbohydrates like ice cream.  Glucose logs and insulin administration records pertaining to this visit,  to be scanned into patient's records.  Recent labs reviewed.   - I have re-counseled the patient on diet management   by adopting a carbohydrate restricted / protein rich  Diet.  - Suggestion is made for patient to avoid simple carbohydrates   from their diet including Cakes , Desserts, Ice Cream,  Soda (  diet and regular) , Sweet Tea , Candies,  Chips, Cookies, Artificial Sweeteners,   and "Sugar-free" Products .  This will help patient to have stable blood glucose profile and potentially avoid unintended  Weight gain.  - Patient is advised to stick to a routine mealtimes to eat 3 meals  a day and avoid unnecessary snacks ( to snack only to correct hypoglycemia).  - The patient  has been  scheduled with Norm Salt, RDN, CDE for individualized DM education.  - I have approached patient with the following individualized plan to manage diabetes and patient agrees.  - I will continue Levemir 32 units qhs,  Novolog 10 units TIDAC plus SSI.  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 -Patient is not a candidate for metformin,SGLT2 inhibitors, and incretin therapy.  -  Patient specific target  for A1c; LDL, HDL, Triglycerides, and  Waist Circumference were discussed in detail.  2) BP/HTN: Uncontrolled, patient claims she has controlled blood pressure at home. Continue current medications including ACEI/ARB. 3) Lipids/HPL:  continue statins. 4)  Weight/Diet: CDE consult in progress, exercise, and carbohydrates information provided.  5) Chronic Care/Health Maintenance:  -Patient is on ACEI/ARB and Statin medications and encouraged to continue to follow up with Ophthalmology, Podiatrist at least yearly or according to recommendations, and advised to  stay away from smoking. I have recommended yearly flu vaccine and pneumonia vaccination at least every 5 years; moderate intensity exercise for up to 150 minutes weekly; and  sleep for at least 7  hours a day.  - 25 minutes of time was spent on the care of this patient , 50% of which was applied for counseling on diabetes complications and their preventions.  - I advised patient to maintain close follow up with Nancy Smiles., Nancy Sutton for primary care needs.  Patient is asked to bring meter and  blood glucose logs during their next visit.   Follow up plan: -Return in about 3 months (around 06/24/2016) for follow up with pre-visit labs, meter, and logs.  Marquis Lunch, Nancy Sutton Phone: [phone]  Fax: [phone]   03/24/2016, 10:21 AM

## 2016-05-27 DIAGNOSIS — H6121 Impacted cerumen, right ear: Secondary | ICD-10-CM | POA: Diagnosis not present

## 2016-05-27 DIAGNOSIS — H903 Sensorineural hearing loss, bilateral: Secondary | ICD-10-CM | POA: Diagnosis not present

## 2016-05-31 DIAGNOSIS — H903 Sensorineural hearing loss, bilateral: Secondary | ICD-10-CM | POA: Diagnosis not present

## 2016-06-07 DIAGNOSIS — H401131 Primary open-angle glaucoma, bilateral, mild stage: Secondary | ICD-10-CM | POA: Diagnosis not present

## 2016-06-25 ENCOUNTER — Other Ambulatory Visit: Payer: Self-pay | Admitting: "Endocrinology

## 2016-06-25 DIAGNOSIS — E1059 Type 1 diabetes mellitus with other circulatory complications: Secondary | ICD-10-CM | POA: Diagnosis not present

## 2016-06-25 LAB — HEMOGLOBIN A1C
HEMOGLOBIN A1C: 7.6 % — AB (ref <5.7)
Mean Plasma Glucose: 171 mg/dL

## 2016-06-25 LAB — COMPLETE METABOLIC PANEL WITH GFR
ALBUMIN: 3.8 g/dL (ref 3.6–5.1)
ALK PHOS: 51 U/L (ref 33–130)
ALT: 14 U/L (ref 6–29)
AST: 18 U/L (ref 10–35)
BUN: 24 mg/dL (ref 7–25)
CALCIUM: 9.6 mg/dL (ref 8.6–10.4)
CO2: 25 mmol/L (ref 20–31)
CREATININE: 0.86 mg/dL (ref 0.60–0.93)
Chloride: 104 mmol/L (ref 98–110)
GFR, Est African American: 79 mL/min (ref >=60)
GFR, Est Non African American: 69 mL/min (ref >=60)
Glucose, Bld: 143 mg/dL — ABNORMAL HIGH (ref 65–99)
POTASSIUM: 4.2 mmol/L (ref 3.5–5.3)
Sodium: 139 mmol/L (ref 135–146)
Total Bilirubin: 0.7 mg/dL (ref 0.2–1.2)
Total Protein: 6.7 g/dL (ref 6.1–8.1)

## 2016-07-02 ENCOUNTER — Ambulatory Visit (INDEPENDENT_AMBULATORY_CARE_PROVIDER_SITE_OTHER): Payer: Medicare Other | Admitting: "Endocrinology

## 2016-07-02 ENCOUNTER — Encounter: Payer: Self-pay | Admitting: "Endocrinology

## 2016-07-02 VITALS — BP 130/74 | HR 87 | Ht 59.0 in | Wt 134.0 lb

## 2016-07-02 DIAGNOSIS — I10 Essential (primary) hypertension: Secondary | ICD-10-CM | POA: Diagnosis not present

## 2016-07-02 DIAGNOSIS — E1059 Type 1 diabetes mellitus with other circulatory complications: Secondary | ICD-10-CM

## 2016-07-02 DIAGNOSIS — E782 Mixed hyperlipidemia: Secondary | ICD-10-CM | POA: Diagnosis not present

## 2016-07-02 NOTE — Progress Notes (Signed)
Subjective:    Patient ID: Nancy Sutton, female    DOB: 1946-02-05, PCP Cassell Smiles., MD   Past Medical History:  Diagnosis Date  . Arthritis   . Diabetes mellitus    since 2008  . Essential hypertension, benign 09/10/2015  . GERD (gastroesophageal reflux disease)   . Hypercholesteremia   . Hyperlipidemia   . Stroke (HCC) 03/2011   only deficits-affected eyesight   Past Surgical History:  Procedure Laterality Date  . CATARACT EXTRACTION W/PHACO  11/01/2011   Procedure: CATARACT EXTRACTION PHACO AND INTRAOCULAR LENS PLACEMENT (IOC);  Surgeon: Gemma Payor, MD;  Location: [facility];  Service: Ophthalmology;  Laterality: Right;  CDE 12.48  . ESOPHAGOGASTRODUODENOSCOPY (EGD) WITH ESOPHAGEAL DILATION N/A 02/14/2013   Procedure: ESOPHAGOGASTRODUODENOSCOPY (EGD) WITH ESOPHAGEAL DILATION;  Surgeon: Malissa Hippo, MD;  Location: [facility];  Service: Endoscopy;  Laterality: N/A;  200-moved to 255 Ann to notify pt  . EYE SURGERY  10-2010   left eye removal of cataract  . TUBAL LIGATION  1984   Social History   Social History  . Marital status: Married    Spouse name: wayne  . Number of children: 2  . Years of education: 12   Occupational History  . 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 Smoking status: Never Smoker  . Smokeless tobacco: Never Used  . Alcohol use No  . Drug use: No  . Sexual activity: Yes    Birth control/ protection: None   Other Topics Concern  . None   Social History Narrative   Patient is married with 2 children.   Patient is right handed.   Patient has hs education.   Patient drinks 3-5 glasses of tea daily.   Outpatient Encounter Prescriptions as of 07/02/2016  Medication Sig  . clopidogrel (PLAVIX) 75 MG tablet Take 1 tablet (75 mg total) by mouth daily.  Marland Kitchen GLOBAL EASE INJECT PEN NEEDLES 32G X 4 MM MISC USE 4 PENTIPS EVERY DAY  . glucose blood (TRUE METRIX BLOOD GLUCOSE TEST) test strip Use as  instructed 4 x daily  . Insulin Aspart (NOVOLOG FLEXPEN [place]) Inject 10-16 Units into the skin 3 (three) times daily with meals.  . Insulin Detemir (LEVEMIR FLEXTOUCH [place]) Inject 35 Units into the skin at bedtime.  Marland Kitchen l-methylfolate-B6-B12 (METANX) 3-35-2 MG TABS Take 1 tablet by mouth 2 (two) times daily.  Marland Kitchen losartan (COZAAR) 25 MG tablet Take 25 mg by mouth daily. Reported on 10/15/2015  . Multiple Vitamin (ONE-A-DAY 55 PLUS PO) Take by mouth daily.  . pantoprazole (PROTONIX) 40 MG tablet Take 1 tablet (40 mg total) by mouth daily.  . ranitidine (ZANTAC) 75 MG tablet Take 2 tablets (150 mg total) by mouth daily as needed for heartburn.  . simvastatin (ZOCOR) 40 MG tablet Take 40 mg by mouth every evening.  . vitamin C (ASCORBIC ACID) 500 MG tablet Take 500 mg by mouth daily.  . vitamin E (VITAMIN E) 400 UNIT capsule Take 400 Units by mouth daily.   No facility-administered encounter medications on file as of 07/02/2016.    ALLERGIES: Allergies  Allergen Reactions  . Ampicillin Other (See Comments)    Breaks out in bumps  . Bromday [Bromfenac Sodium] Itching and Swelling  . Tetanus Toxoids Other (See Comments)    Breaks out in bumps   VACCINATION STATUS: Immunization History  Administered Date(s) Administered  . Rabies, IM 02/13/2014, 02/16/2014, 02/20/2014, 02/27/2014  Diabetes  She presents for her follow-up diabetic visit. She has type 1 diabetes mellitus. Onset time: She was diagnosed at approximate age of 70 years. Her disease course has been improving. There are no hypoglycemic associated symptoms. Pertinent negatives for hypoglycemia include no confusion, headaches, pallor or seizures. There are no diabetic associated symptoms. Pertinent negatives for diabetes include no chest pain, no polydipsia, no polyphagia and no polyuria. There are no hypoglycemic complications. Symptoms are improving. Diabetic complications include a CVA. Current diabetic treatment includes intensive insulin  program. She is compliant with treatment some of the time. Her weight is stable. She is following a generally unhealthy diet. When asked about meal planning, she reported none. She has had a previous visit with a dietitian. She never participates in exercise. Her home blood glucose trend is decreasing steadily. Her breakfast blood glucose range is generally 140-180 mg/dl. Her lunch blood glucose range is generally 140-180 mg/dl. Her dinner blood glucose range is generally 140-180 mg/dl. Her overall blood glucose range is 140-180 mg/dl. An ACE inhibitor/angiotensin II receptor blocker is being taken.  Hyperlipidemia  This is a chronic problem. The current episode started more than 1 year ago. Exacerbating diseases include diabetes. Pertinent negatives include no chest pain, myalgias or shortness of breath. Current antihyperlipidemic treatment includes statins. Risk factors for coronary artery disease include diabetes mellitus, dyslipidemia, hypertension and a sedentary lifestyle.  Hypertension  This is a chronic problem. The current episode started more than 1 year ago. Pertinent negatives include no chest pain, headaches, palpitations or shortness of breath. Past treatments include ACE inhibitors. Hypertensive end-organ damage includes CVA.     Review of Systems  Constitutional: Negative for chills, fever and unexpected weight change.  HENT: Negative for trouble swallowing and voice change.   Eyes: Negative for visual disturbance.  Respiratory: Negative for cough, shortness of breath and wheezing.   Cardiovascular: Negative for chest pain, palpitations and leg swelling.  Gastrointestinal: Negative for diarrhea, nausea and vomiting.  Endocrine: Negative for cold intolerance, heat intolerance, polydipsia, polyphagia and polyuria.  Musculoskeletal: Negative for arthralgias and myalgias.  Skin: Negative for color change, pallor, rash and wound.  Neurological: Negative for seizures and headaches.   Psychiatric/Behavioral: Negative for confusion and suicidal ideas.    Objective:    BP 130/74   Pulse 87   Ht 4\' 11"  (1.499 m)   Wt 134 lb (60.8 kg)   BMI 27.06 kg/m   Wt Readings from Last 3 Encounters:  07/02/16 134 lb (60.8 kg)  03/24/16 134 lb (60.8 kg)  12/17/15 134 lb (60.8 kg)    Physical Exam  Constitutional: She is oriented to person, place, and time. She appears well-developed.  HENT:  Head: Normocephalic and atraumatic.  Eyes: EOM are normal.  Neck: Normal range of motion. Neck supple. No tracheal deviation present. No thyromegaly present.  Cardiovascular: Normal rate and regular rhythm.   Pulmonary/Chest: Effort normal and breath sounds normal.  Abdominal: Soft. Bowel sounds are normal. There is no tenderness. There is no guarding.  Musculoskeletal: Normal range of motion. She exhibits no edema.  Neurological: She is alert and oriented to person, place, and time. She has normal reflexes. No cranial nerve deficit. Coordination normal.  Skin: Skin is warm and dry. No rash noted. No erythema. No pallor.  Psychiatric: She has a normal mood and affect. Judgment normal.     Lab Results  Component Value Date   WBC 10.9 (H) 02/13/2014   HGB 11.3 (L) 02/13/2014   HCT 33.4 (  L) 02/13/2014   MCV 88.8 02/13/2014   PLT 225 02/13/2014   Chemistry (most recent): Lab Results  Component Value Date   NA 139 06/25/2016   K 4.2 06/25/2016   CL 104 06/25/2016   CO2 25 06/25/2016   BUN 24 06/25/2016   CREATININE 0.86 06/25/2016   Diabetic Labs (most recent): Lab Results  Component Value Date   HGBA1C 7.6 (H) 06/25/2016   HGBA1C 7.0 (H) 03/17/2016   HGBA1C 7.5 (H) 12/09/2015   Lipid Panel     Component Value Date/Time   CHOL 206 (H) 05/01/2011 0540   TRIG 304 (H) 05/01/2011 0540   HDL 50 05/01/2011 0540   CHOLHDL 4.1 05/01/2011 0540   VLDL 61 (H) 05/01/2011 0540   LDLCALC 95 05/01/2011 0540     Assessment & Plan:   1. 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 These are all discussed in detail with the patient.  Patient came with stable glucose profile, and  recent A1c increased to 7.6% from 7%. She still has inconsistent meals habit and pattern and unable to avoid simple carbohydrates like ice cream.  Glucose logs and insulin administration records pertaining to this visit,  to be scanned into patient's records.  Recent labs reviewed.   - I have re-counseled the patient on diet management   by adopting a carbohydrate restricted / protein rich  Diet.  - Suggestion is made for patient to avoid simple carbohydrates   from their diet including Cakes , Desserts, Ice Cream,  Soda (  diet and regular) , Sweet Tea , Candies,  Chips, Cookies, Artificial Sweeteners,   and "Sugar-free" Products .  This will help patient to have stable blood glucose profile and potentially avoid unintended  Weight gain.  - Patient is advised to stick to a routine mealtimes to eat 3 meals  a day and avoid unnecessary snacks ( to snack only to correct hypoglycemia).  - The patient  has been  scheduled with Norm Salt, RDN, CDE for individualized DM education.  - I have approached patient with the following individualized plan to manage diabetes and patient agrees.  - I will increase  Levemir to 35 units qhs, continue Novolog 10 units TIDAC plus SSI.  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 -Patient is not a candidate for metformin,SGLT2 inhibitors, and incretin therapy.  - Patient specific target  for A1c; LDL, HDL, Triglycerides, and  Waist Circumference were discussed in detail.  2) BP/HTN: Uncontrolled, patient claims she has controlled  blood pressure at home. Continue current medications including ACEI/ARB. 3) Lipids/HPL:  continue statins. 4)  Weight/Diet: CDE consult in progress, exercise, and carbohydrates information provided.  5) Chronic Care/Health Maintenance:  -Patient is on ACEI/ARB and Statin medications and encouraged to continue to follow up with Ophthalmology, Podiatrist at least yearly or according to recommendations, and advised to  stay away from smoking. I have recommended yearly flu vaccine and pneumonia vaccination at least every 5 years; moderate intensity exercise for up to 150 minutes weekly; and  sleep for at least 7 hours a day.  - 25 minutes of time was spent on the care of this patient , 50% of which was applied for  counseling on diabetes complications and their preventions.  - I advised patient to maintain close follow up with Cassell Smiles., MD for primary care needs.  Patient is asked to bring meter and  blood glucose logs during their next visit.   Follow up plan: -Return in about 3 months (around 10/02/2016) for follow up with pre-visit labs, meter, and logs.  Marquis Lunch, MD Phone: [phone]  Fax: [phone]   07/02/2016, 10:32 AM

## 2016-08-15 ENCOUNTER — Other Ambulatory Visit: Payer: Self-pay | Admitting: "Endocrinology

## 2016-08-16 ENCOUNTER — Telehealth: Payer: Self-pay

## 2016-08-16 NOTE — Telephone Encounter (Signed)
Pt called to request samples of insulin. I told her I do have one of each and she states she will pick this up today.

## 2016-08-17 ENCOUNTER — Encounter (INDEPENDENT_AMBULATORY_CARE_PROVIDER_SITE_OTHER): Payer: Self-pay | Admitting: Internal Medicine

## 2016-08-24 ENCOUNTER — Encounter: Payer: Self-pay | Admitting: "Endocrinology

## 2016-08-24 ENCOUNTER — Ambulatory Visit (INDEPENDENT_AMBULATORY_CARE_PROVIDER_SITE_OTHER): Payer: Medicare Other | Admitting: "Endocrinology

## 2016-08-24 VITALS — BP 133/78 | HR 84 | Ht 59.0 in | Wt 133.0 lb

## 2016-08-24 DIAGNOSIS — E1059 Type 1 diabetes mellitus with other circulatory complications: Secondary | ICD-10-CM | POA: Diagnosis not present

## 2016-08-24 DIAGNOSIS — I10 Essential (primary) hypertension: Secondary | ICD-10-CM

## 2016-08-24 DIAGNOSIS — E782 Mixed hyperlipidemia: Secondary | ICD-10-CM

## 2016-08-24 NOTE — Progress Notes (Signed)
Subjective:    Patient ID: Nancy Sutton, female    DOB: 24-Oct-1945, PCP Nancy Smiles., Nancy Sutton   Past Medical History:  Diagnosis Date  . Arthritis   . Diabetes mellitus    since 2008  . Essential hypertension, benign 09/10/2015  . GERD (gastroesophageal reflux disease)   . Hypercholesteremia   . Hyperlipidemia   . Stroke (HCC) 03/2011   only deficits-affected eyesight   Past Surgical History:  Procedure Laterality Date  . CATARACT EXTRACTION W/PHACO  11/01/2011   Procedure: CATARACT EXTRACTION PHACO AND INTRAOCULAR LENS PLACEMENT (IOC);  Surgeon: Gemma Payor, Nancy Sutton;  Location: [facility];  Service: Ophthalmology;  Laterality: Right;  CDE 12.48  . ESOPHAGOGASTRODUODENOSCOPY (EGD) WITH ESOPHAGEAL DILATION N/A 02/14/2013   Procedure: ESOPHAGOGASTRODUODENOSCOPY (EGD) WITH ESOPHAGEAL DILATION;  Surgeon: Malissa Hippo, Nancy Sutton;  Location: [facility];  Service: Endoscopy;  Laterality: N/A;  200-moved to 255 Ann to notify pt  . EYE SURGERY  10-2010   left eye removal of cataract  . TUBAL LIGATION  1984   Social History   Social History  . Marital status: Married    Spouse name: wayne  . Number of children: 2  . Years of education: 12   Occupational History  . 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 Smoking status: Never Smoker  . Smokeless tobacco: Never Used  . Alcohol use No  . Drug use: No  . Sexual activity: Yes    Birth control/ protection: None   Other Topics Concern  . None   Social History Narrative   Patient is married with 2 children.   Patient is right handed.   Patient has hs education.   Patient drinks 3-5 glasses of tea daily.   Outpatient Encounter Prescriptions as of 08/24/2016  Medication Sig  . clopidogrel (PLAVIX) 75 MG tablet Take 1 tablet (75 mg total) by mouth daily.  Marland Kitchen GLOBAL EASE INJECT PEN NEEDLES 32G X 4 MM MISC USE 4 PENTIPS EVERY DAY  . glucose blood (TRUE METRIX BLOOD GLUCOSE TEST) test strip Use as  instructed 4 x daily  . Insulin Aspart (NOVOLOG FLEXPEN [place]) Inject 10-16 Units into the skin 3 (three) times daily with meals.  . Insulin Detemir (LEVEMIR FLEXTOUCH [place]) Inject 35 Units into the skin at bedtime.  Marland Kitchen l-methylfolate-B6-B12 (METANX) 3-35-2 MG TABS Take 1 tablet by mouth 2 (two) times daily.  Marland Kitchen losartan (COZAAR) 25 MG tablet Take 25 mg by mouth daily. Reported on 10/15/2015  . Multiple Vitamin (ONE-A-DAY 55 PLUS PO) Take by mouth daily.  . pantoprazole (PROTONIX) 40 MG tablet Take 1 tablet (40 mg total) by mouth daily.  . ranitidine (ZANTAC) 75 MG tablet Take 2 tablets (150 mg total) by mouth daily as needed for heartburn.  . simvastatin (ZOCOR) 40 MG tablet Take 40 mg by mouth every evening.  . vitamin C (ASCORBIC ACID) 500 MG tablet Take 500 mg by mouth daily.  . vitamin E (VITAMIN E) 400 UNIT capsule Take 400 Units by mouth daily.  . [DISCONTINUED] LEVEMIR FLEXTOUCH 100 UNIT/ML Pen INJECT 50 UNITS INTO THE SKIN AT BEDTIME.   No facility-administered encounter medications on file as of 08/24/2016.    ALLERGIES: Allergies  Allergen Reactions  . Ampicillin Other (See Comments)    Breaks out in bumps  . Bromday [Bromfenac Sodium] Itching and Swelling  . Tetanus Toxoids Other (See Comments)    Breaks out in bumps   VACCINATION STATUS: Immunization  History  Administered Date(s) Administered  . Rabies, IM 02/13/2014, 02/16/2014, 02/20/2014, 02/27/2014    Diabetes  She presents for her follow-up diabetic visit. She has type 1 diabetes mellitus. Onset time: She was diagnosed at approximate age of 70 years. Her disease course has been stable. There are no hypoglycemic associated symptoms. Pertinent negatives for hypoglycemia include no confusion, headaches, pallor or seizures. There are no diabetic associated symptoms. Pertinent negatives for diabetes include no chest pain, no polydipsia, no polyphagia and no polyuria. There are no hypoglycemic complications. Symptoms are stable.  Diabetic complications include a CVA. Current diabetic treatment includes intensive insulin program. She is compliant with treatment some of the time. Her weight is stable. She is following a generally unhealthy diet. When asked about meal planning, she reported none. She has had a previous visit with a dietitian. She never participates in exercise. Her home blood glucose trend is decreasing steadily. Her breakfast blood glucose range is generally 140-180 mg/dl. Her lunch blood glucose range is generally 140-180 mg/dl. Her dinner blood glucose range is generally 140-180 mg/dl. Her overall blood glucose range is 140-180 mg/dl. An ACE inhibitor/angiotensin II receptor blocker is being taken. Eye exam is current (She does have a visual deficit from her prior CVA.).  Hyperlipidemia  This is a chronic problem. The current episode started more than 1 year ago. Exacerbating diseases include diabetes. Pertinent negatives include no chest pain, myalgias or shortness of breath. Current antihyperlipidemic treatment includes statins. Risk factors for coronary artery disease include diabetes mellitus, dyslipidemia, hypertension and a sedentary lifestyle.  Hypertension  This is a chronic problem. The current episode started more than 1 year ago. Pertinent negatives include no chest pain, headaches, palpitations or shortness of breath. Past treatments include ACE inhibitors. Hypertensive end-organ damage includes CVA.     Review of Systems  Constitutional: Negative for chills, fever and unexpected weight change.  HENT: Negative for trouble swallowing and voice change.   Eyes: Negative for visual disturbance.  Respiratory: Negative for cough, shortness of breath and wheezing.   Cardiovascular: Negative for chest pain, palpitations and leg swelling.  Gastrointestinal: Negative for diarrhea, nausea and vomiting.  Endocrine: Negative for cold intolerance, heat intolerance, polydipsia, polyphagia and polyuria.   Musculoskeletal: Negative for arthralgias and myalgias.  Skin: Negative for color change, pallor, rash and wound.  Neurological: Negative for seizures and headaches.  Psychiatric/Behavioral: Negative for confusion and suicidal ideas.    Objective:    BP 133/78   Pulse 84   Ht 4\' 11"  (1.499 m)   Wt 133 lb (60.3 kg)   BMI 26.86 kg/m   Wt Readings from Last 3 Encounters:  08/24/16 133 lb (60.3 kg)  07/02/16 134 lb (60.8 kg)  03/24/16 134 lb (60.8 kg)    Physical Exam  Constitutional: She is oriented to person, place, and time. She appears well-developed.  HENT:  Head: Normocephalic and atraumatic.  Eyes: EOM are normal.  Neck: Normal range of motion. Neck supple. No tracheal deviation present. No thyromegaly present.  Cardiovascular: Normal rate and regular rhythm.   Pulmonary/Chest: Effort normal and breath sounds normal.  Abdominal: Soft. Bowel sounds are normal. There is no tenderness. There is no guarding.  Musculoskeletal: Normal range of motion. She exhibits no edema.  Neurological: She is alert and oriented to person, place, and time. She has normal reflexes. No cranial nerve deficit. Coordination normal.  Skin: Skin is warm and dry. No rash noted. No erythema. No pallor.  Psychiatric: She has a normal mood  and affect. Judgment normal.     Lab Results  Component Value Date   WBC 10.9 (H) 02/13/2014   HGB 11.3 (L) 02/13/2014   HCT 33.4 (L) 02/13/2014   MCV 88.8 02/13/2014   PLT 225 02/13/2014   Chemistry (most recent): Lab Results  Component Value Date   NA 139 06/25/2016   K 4.2 06/25/2016   CL 104 06/25/2016   CO2 25 06/25/2016   BUN 24 06/25/2016   CREATININE 0.86 06/25/2016   Diabetic Labs (most recent): Lab Results  Component Value Date   HGBA1C 7.6 (H) 06/25/2016   HGBA1C 7.0 (H) 03/17/2016   HGBA1C 7.5 (H) 12/09/2015   Lipid Panel     Component Value Date/Time   CHOL 206 (H) 05/01/2011 0540   TRIG 304 (H) 05/01/2011 0540   HDL 50 05/01/2011  0540   CHOLHDL 4.1 05/01/2011 0540   VLDL 61 (H) 05/01/2011 0540   LDLCALC 95 05/01/2011 0540     Assessment & Plan:   1. 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 These are all discussed in detail with the patient.  Patient came with stable glucose profile, and  recent A1c was 7.6% Increasing from 7%. She  has  better consistency for meals and insulin use now than before.   - she is still unable to avoid simple carbohydrates like ice cream.  Glucose logs and insulin administration records pertaining to this visit,  to be scanned into patient's records.  Recent labs reviewed.   - I have re-counseled the patient on diet management   by adopting a carbohydrate restricted / protein rich  Diet.  - Suggestion is made for patient to avoid simple carbohydrates   from their diet including Cakes , Desserts, Ice Cream,  Soda (  diet and regular) , Sweet Tea , Candies,  Chips, Cookies, Artificial Sweeteners,   and "Sugar-free" Products .  This will help patient to have stable blood glucose profile and potentially avoid unintended  Weight gain.  - Patient is advised to stick to a routine mealtimes to eat 3 meals  a day and avoid unnecessary snacks ( to snack only to correct hypoglycemia).  - The patient  has been  scheduled with Norm Salt, RDN, CDE for individualized DM education.  - I have approached patient with the following individualized plan to manage diabetes and patient agrees.  - She has visual limitation and inconsistent meal pattern. - She is not a suitable candidate for insulin pump, however she may benefit from continued glucose monitoring. I gave her a brochure any information on DEXCOM. -  I will continue  Levemir  35 units qhs, continue Novolog 10 units TIDAC plus  SSI.  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 -Patient is not a candidate for metformin,SGLT2 inhibitors, and incretin therapy.  - Patient specific target  for A1c; LDL, HDL, Triglycerides, and  Waist Circumference were discussed in detail.  2) BP/HTN: Uncontrolled, patient claims she has controlled blood pressure at home. Continue current medications including ACEI/ARB. 3) Lipids/HPL:  continue statins. 4)  Weight/Diet: CDE consult in progress, exercise, and carbohydrates information provided.  5) Chronic Care/Health Maintenance:  -Patient is on ACEI/ARB and Statin medications and  encouraged to continue to follow up with Ophthalmology, Podiatrist at least yearly or according to recommendations, and advised to  stay away from smoking. I have recommended yearly flu vaccine and pneumonia vaccination at least every 5 years; moderate intensity exercise for up to 150 minutes weekly; and  sleep for at least 7 hours a day.  - 25 minutes of time was spent on the care of this patient , 50% of which was applied for counseling on diabetes complications and their preventions.  - I advised patient to maintain close follow up with Nancy Sutton,Nancy J., Nancy Sutton for primary care needs.  Patient is asked to bring meter and  blood glucose logs during their next visit.   Follow up plan: -Return in about 8 weeks (around 10/19/2016) for follow up with pre-visit labs, meter, and logs.  Nancy LunchGebre Lakina Mcintire, Nancy Sutton Phone: [phone]  Fax: [phone]   08/24/2016, 11:44 AM

## 2016-08-26 ENCOUNTER — Telehealth: Payer: Self-pay

## 2016-08-26 NOTE — Telephone Encounter (Signed)
Called pt to let her know that we have samples of Lantus that she can pick these up in the a.m.

## 2016-08-26 NOTE — Telephone Encounter (Signed)
Pt left message that she needs insulin samples. I advised her that we do have a Humalog that she can use to substitute for Novolog.

## 2016-09-07 ENCOUNTER — Telehealth: Payer: Self-pay

## 2016-09-07 NOTE — Telephone Encounter (Signed)
Pt request sample of humalog. Notified pt she can pick this up

## 2016-10-01 ENCOUNTER — Other Ambulatory Visit (INDEPENDENT_AMBULATORY_CARE_PROVIDER_SITE_OTHER): Payer: Self-pay | Admitting: Internal Medicine

## 2016-10-05 ENCOUNTER — Ambulatory Visit: Payer: Medicare Other | Admitting: "Endocrinology

## 2016-10-11 ENCOUNTER — Other Ambulatory Visit: Payer: Self-pay | Admitting: "Endocrinology

## 2016-10-11 DIAGNOSIS — E1129 Type 2 diabetes mellitus with other diabetic kidney complication: Secondary | ICD-10-CM | POA: Diagnosis not present

## 2016-10-11 DIAGNOSIS — E782 Mixed hyperlipidemia: Secondary | ICD-10-CM | POA: Diagnosis not present

## 2016-10-11 DIAGNOSIS — E1059 Type 1 diabetes mellitus with other circulatory complications: Secondary | ICD-10-CM | POA: Diagnosis not present

## 2016-10-11 LAB — COMPREHENSIVE METABOLIC PANEL
ALBUMIN: 3.9 g/dL (ref 3.6–5.1)
ALK PHOS: 52 U/L (ref 33–130)
ALT: 14 U/L (ref 6–29)
AST: 18 U/L (ref 10–35)
BILIRUBIN TOTAL: 0.5 mg/dL (ref 0.2–1.2)
BUN: 26 mg/dL — ABNORMAL HIGH (ref 7–25)
CO2: 27 mmol/L (ref 20–31)
Calcium: 9.9 mg/dL (ref 8.6–10.4)
Chloride: 105 mmol/L (ref 98–110)
Creat: 0.78 mg/dL (ref 0.60–0.93)
Glucose, Bld: 98 mg/dL (ref 65–99)
Potassium: 4.4 mmol/L (ref 3.5–5.3)
Sodium: 141 mmol/L (ref 135–146)
Total Protein: 7.2 g/dL (ref 6.1–8.1)

## 2016-10-11 LAB — LIPID PANEL
Cholesterol: 260 mg/dL — ABNORMAL HIGH (ref <200)
HDL: 96 mg/dL (ref >50)
LDL CALC: 144 mg/dL — AB (ref <100)
TRIGLYCERIDES: 98 mg/dL (ref <150)
Total CHOL/HDL Ratio: 2.7 Ratio (ref <5.0)
VLDL: 20 mg/dL (ref <30)

## 2016-10-11 LAB — HEMOGLOBIN A1C
Hgb A1c MFr Bld: 7 % — ABNORMAL HIGH (ref <5.7)
Mean Plasma Glucose: 154 mg/dL

## 2016-10-12 LAB — MICROALBUMIN / CREATININE URINE RATIO
CREATININE, URINE: 103 mg/dL (ref 20–320)
Microalb Creat Ratio: 5 mcg/mg creat (ref <30)
Microalb, Ur: 0.5 mg/dL

## 2016-10-14 ENCOUNTER — Encounter (INDEPENDENT_AMBULATORY_CARE_PROVIDER_SITE_OTHER): Payer: Self-pay

## 2016-10-14 ENCOUNTER — Ambulatory Visit (INDEPENDENT_AMBULATORY_CARE_PROVIDER_SITE_OTHER): Payer: Medicare Other | Admitting: Internal Medicine

## 2016-10-14 ENCOUNTER — Encounter (INDEPENDENT_AMBULATORY_CARE_PROVIDER_SITE_OTHER): Payer: Self-pay | Admitting: Internal Medicine

## 2016-10-14 VITALS — BP 136/58 | HR 64 | Temp 97.4°F | Ht 59.0 in | Wt 136.7 lb

## 2016-10-14 DIAGNOSIS — K219 Gastro-esophageal reflux disease without esophagitis: Secondary | ICD-10-CM

## 2016-10-14 MED ORDER — PANTOPRAZOLE SODIUM 40 MG PO TBEC: 40.0000 mg | DELAYED_RELEASE_TABLET | Freq: Two times a day (BID) | ORAL | 4 refills | Status: DC

## 2016-10-14 NOTE — Patient Instructions (Signed)
Protonix 40mg  BID. OV in 1 year.  Return stool cards to our office when finished

## 2016-10-14 NOTE — Progress Notes (Signed)
Subjective:    Patient ID: Nancy Sutton, female    DOB: 1946/06/23, 71 y.o.   MRN: [id number] 10/15/2015 133. Ht 59 HPI  HPI Here today for f/u of her GERD. She was last seen in January of 2017. Also hx of dysphagia.  She tells me today she is doing good. She is having She has never undergone a colonoscopy.  She tells me at night she has to take a Zantac about 4 times a weeks. She was taking a Tums about every night. She has the head of bed elevated. She does not eat past 6 or 7.  Appetite is good. She has gained 3.5 pounds since her last visit. She has a BM daily. No melena or BRRB.  She is not exercising   CVA and maintained on Plavix       EGD with ED.  Indications: Patient is 71 year old Caucasian female with history of GERD who presents with 2-3 month history of solid food dysphagia. 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 This stricture was dilated with a balloon to 18 mm. Small sliding hiatal hernia. Small polyp there are cold biopsy from proximal esophagus possibly a papilloma.  Review of Systems Past Medical History:  Diagnosis Date  . Arthritis   . Diabetes mellitus    since 2008  . Essential hypertension, benign 09/10/2015  . GERD (gastroesophageal reflux disease)   . Hypercholesteremia   . Hyperlipidemia   . 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 CATARACT EXTRACTION W/PHACO  11/01/2011   Procedure: CATARACT EXTRACTION PHACO AND INTRAOCULAR LENS PLACEMENT (IOC);  Surgeon: Gemma Payor, MD;  Location: [facility];  Service: Ophthalmology;  Laterality: Right;  CDE 12.48  . ESOPHAGOGASTRODUODENOSCOPY (EGD) WITH ESOPHAGEAL DILATION N/A 02/14/2013   Procedure: ESOPHAGOGASTRODUODENOSCOPY (EGD) WITH ESOPHAGEAL DILATION;  Surgeon: Malissa Hippo,  MD;  Location: [facility];  Service: Endoscopy;  Laterality: N/A;  200-moved to 255 Ann to notify pt  . EYE SURGERY  10-2010   left eye removal of cataract  . TUBAL LIGATION  1984    Allergies  Allergen Reactions  . Ampicillin Other (See Comments)    Breaks out in bumps  . Bromday [Bromfenac Sodium] Itching and Swelling  . Tetanus Toxoids Other (See Comments)    Breaks out in bumps    Current Outpatient Prescriptions on File Prior to Visit  Medication Sig Dispense Refill  . clopidogrel (PLAVIX) 75 MG tablet Take 1 tablet (75 mg total) by mouth daily. 90 tablet 3  . GLOBAL EASE INJECT PEN NEEDLES 32G X 4 MM MISC USE 4 PENTIPS EVERY DAY 100 each 5  . glucose blood (TRUE METRIX BLOOD GLUCOSE TEST) test strip Use as instructed 4 x daily 150 each 11  . Insulin Aspart (NOVOLOG FLEXPEN [place]) Inject 10-16 Units into the skin 3 (three) times daily with meals.    . Insulin Detemir (LEVEMIR FLEXTOUCH [place]) Inject 35 Units into the skin at bedtime.    Marland Kitchen l-methylfolate-B6-B12 (METANX) 3-35-2 MG TABS Take 1 tablet by mouth 2 (two) times daily.    Marland Kitchen losartan (COZAAR) 25 MG tablet Take 25 mg by mouth daily. Reported on 10/15/2015    . Multiple Vitamin (ONE-A-DAY 55 PLUS PO) Take by mouth daily.    . ranitidine (ZANTAC) 75 MG tablet Take 2 tablets (150 mg total) by mouth daily as needed for heartburn.    . simvastatin (ZOCOR) 40 MG  tablet Take 40 mg by mouth every evening.    . vitamin C (ASCORBIC ACID) 500 MG tablet Take 500 mg by mouth daily.    . vitamin E (VITAMIN E) 400 UNIT capsule Take 400 Units by mouth daily.     No current facility-administered medications on file prior to visit.        Objective:   Physical Exam Blood pressure (!) 136/58, pulse 64, temperature 97.4 F (36.3 C), height 4\' 11"  (1.499 m), weight 136 lb 11.2 oz (62 kg). Alert and oriented. Skin warm and dry. Oral mucosa is moist.   . Sclera anicteric, conjunctivae is pink. Thyroid not enlarged. No cervical lymphadenopathy.  Lungs clear. Heart regular rate and rhythm.  Abdomen is soft. Bowel sounds are positive. No hepatomegaly. No abdominal masses felt. No tenderness.  No edema to lower extremities.         Assessment & Plan:  GERD. Am going to increase to BID. Colonoscopy. She does not desire to have one. Am going to send 3 stool cards home with her. 3 stool cards home with patient.  OV in 1 year.

## 2016-10-19 ENCOUNTER — Telehealth (INDEPENDENT_AMBULATORY_CARE_PROVIDER_SITE_OTHER): Payer: Self-pay | Admitting: *Deleted

## 2016-10-19 DIAGNOSIS — K219 Gastro-esophageal reflux disease without esophagitis: Secondary | ICD-10-CM | POA: Diagnosis not present

## 2016-10-19 DIAGNOSIS — Z1211 Encounter for screening for malignant neoplasm of colon: Secondary | ICD-10-CM | POA: Diagnosis not present

## 2016-10-19 NOTE — Telephone Encounter (Signed)
   Diagnosis:    Result(s)   Card 1:  Negative:     Card 2: Negative:   Card 3:Negative:    Completed by: Keylani Perlstein,LPN   HEMOCCULT SENSA DEVELOPER: AVW#:[id number]#:[id number]   EXPIRATION DATE: 2020-05   HEMOCCULT SENSA CARD:  BJY#:[id number]#:[id number]   EXPIRATION DATE: 05/20   CARD CONTROL RESULTS:  POSITIVE: Positive  NEGATIVE: Negative    ADDITIONAL COMMENTS: Results forwarded to Delrae Renderri Setzer,NP

## 2016-10-22 ENCOUNTER — Ambulatory Visit (INDEPENDENT_AMBULATORY_CARE_PROVIDER_SITE_OTHER): Payer: Medicare Other | Admitting: "Endocrinology

## 2016-10-22 ENCOUNTER — Encounter: Payer: Self-pay | Admitting: "Endocrinology

## 2016-10-22 VITALS — BP 139/65 | HR 90 | Ht 59.0 in | Wt 135.0 lb

## 2016-10-22 DIAGNOSIS — I10 Essential (primary) hypertension: Secondary | ICD-10-CM

## 2016-10-22 DIAGNOSIS — E782 Mixed hyperlipidemia: Secondary | ICD-10-CM | POA: Diagnosis not present

## 2016-10-22 DIAGNOSIS — E1059 Type 1 diabetes mellitus with other circulatory complications: Secondary | ICD-10-CM

## 2016-10-22 MED ORDER — INSULIN ASPART 100 UNIT/ML FLEXPEN: 12.0000 [IU] | PEN_INJECTOR | Freq: Three times a day (TID) | SUBCUTANEOUS | 1 refills | Status: DC

## 2016-10-22 MED ORDER — INSULIN DETEMIR 100 UNIT/ML FLEXPEN: 30.0000 [IU] | PEN_INJECTOR | Freq: Every day | SUBCUTANEOUS | 1 refills | Status: DC

## 2016-10-22 NOTE — Progress Notes (Signed)
Subjective:    Patient ID: Nancy Sutton, female    DOB: [birth year], PCP Nancy Sutton,Nancy J., Nancy Sutton   Past Medical History:  Diagnosis Date  . Arthritis   . Diabetes mellitus    since 2008  . Essential hypertension, benign 09/10/2015  . GERD (gastroesophageal reflux disease)   . Hypercholesteremia   . Hyperlipidemia   . Stroke (HCC) 03/2011   only deficits-affected eyesight   Past Surgical History:  Procedure Laterality Date  . CATARACT EXTRACTION W/PHACO  11/01/2011   Procedure: CATARACT EXTRACTION PHACO AND INTRAOCULAR LENS PLACEMENT (IOC);  Surgeon: Gemma PayorKerry Hunt, Nancy Sutton;  Location: [facility];  Service: Ophthalmology;  Laterality: Right;  CDE 12.48  . ESOPHAGOGASTRODUODENOSCOPY (EGD) WITH ESOPHAGEAL DILATION N/A 02/14/2013   Procedure: ESOPHAGOGASTRODUODENOSCOPY (EGD) WITH ESOPHAGEAL DILATION;  Surgeon: Malissa HippoNajeeb U Rehman, Nancy Sutton;  Location: [facility];  Service: Endoscopy;  Laterality: N/A;  200-moved to 255 Ann to notify pt  . EYE SURGERY  10-2010   left eye removal of cataract  . TUBAL LIGATION  1984   Social History   Social History  . Marital status: Married    Spouse name: Nancy Sutton  . Number of children: 2  . Years of education: 12   Occupational History  . 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 Smoking status: Never Smoker  . Smokeless tobacco: Never Used  . Alcohol use No  . Drug use: No  . Sexual activity: Yes    Birth control/ protection: None   Other Topics Concern  . None   Social History Narrative   Patient is married with 2 children.   Patient is right handed.   Patient has hs education.   Patient drinks 3-5 glasses of tea daily.   Outpatient Encounter Prescriptions as of 10/22/2016  Medication Sig  . clopidogrel (PLAVIX) 75 MG tablet Take 1 tablet (75 mg total) by mouth daily.  Marland Kitchen. GLOBAL EASE INJECT PEN NEEDLES 32G X 4 MM MISC USE 4 PENTIPS EVERY DAY  . glucose blood (TRUE METRIX BLOOD GLUCOSE TEST) test strip Use as  instructed 4 x daily  . insulin aspart (NOVOLOG FLEXPEN) 100 UNIT/ML FlexPen Inject 12-18 Units into the skin 3 (three) times daily with meals. Do not inject if premeal glucose is below 70  . Insulin Detemir (LEVEMIR FLEXTOUCH) 100 UNIT/ML Pen Inject 30 Units into the skin at bedtime.  Marland Kitchen. l-methylfolate-B6-B12 (METANX) 3-35-2 MG TABS Take 1 tablet by mouth 2 (two) times daily.  Marland Kitchen. losartan (COZAAR) 25 MG tablet Take 25 mg by mouth daily. Reported on 10/15/2015  . Multiple Vitamin (ONE-A-DAY 55 PLUS PO) Take by mouth daily.  . pantoprazole (PROTONIX) 40 MG tablet Take 1 tablet (40 mg total) by mouth 2 (two) times daily before a meal.  . ranitidine (ZANTAC) 75 MG tablet Take 2 tablets (150 mg total) by mouth daily as needed for heartburn.  . simvastatin (ZOCOR) 40 MG tablet Take 40 mg by mouth every evening.  . vitamin C (ASCORBIC ACID) 500 MG tablet Take 500 mg by mouth daily.  . vitamin E (VITAMIN E) 400 UNIT capsule Take 400 Units by mouth daily.  . [DISCONTINUED] Insulin Aspart (NOVOLOG FLEXPEN [place]) Inject 12-18 Units into the skin 3 (three) times daily with meals. Do not inject if premeal glucose is below 70  . [DISCONTINUED] Insulin Detemir (LEVEMIR FLEXTOUCH [place]) Inject 30 Units into the skin at bedtime.   No facility-administered encounter medications on file as of 10/22/2016.  ALLERGIES: Allergies  Allergen Reactions  . Ampicillin Other (See Comments)    Breaks out in bumps  . Bromday [Bromfenac Sodium] Itching and Swelling  . Tetanus Toxoids Other (See Comments)    Breaks out in bumps   VACCINATION STATUS: Immunization History  Administered Date(s) Administered  . Rabies, IM 02/13/2014, 02/16/2014, 02/20/2014, 02/27/2014    Diabetes  She presents for her follow-up diabetic visit. She has type 1 diabetes mellitus. Onset time: She was diagnosed at approximate age of 39 years. Her disease course has been improving. There are no hypoglycemic associated symptoms. Pertinent negatives for  hypoglycemia include no confusion, headaches, pallor or seizures. There are no diabetic associated symptoms. Pertinent negatives for diabetes include no chest pain, no polydipsia, no polyphagia and no polyuria. There are no hypoglycemic complications. Symptoms are improving. Diabetic complications include a CVA. Current diabetic treatment includes intensive insulin program. She is compliant with treatment some of the time. Her weight is stable. She is following a generally unhealthy diet. When asked about meal planning, she reported none. She has had a previous visit with a dietitian. She never participates in exercise. Her home blood glucose trend is fluctuating minimally. Her breakfast blood glucose range is generally 110-130 mg/dl. Her lunch blood glucose range is generally 180-200 mg/dl. Her dinner blood glucose range is generally 140-180 mg/dl. Her overall blood glucose range is 140-180 mg/dl. An ACE inhibitor/angiotensin II receptor blocker is being taken. Eye exam is current (She does have a visual deficit from her prior CVA.).  Hyperlipidemia  This is a chronic problem. The current episode started more than 1 year ago. Exacerbating diseases include diabetes. Pertinent negatives include no chest pain, myalgias or shortness of breath. Current antihyperlipidemic treatment includes statins. Risk factors for coronary artery disease include diabetes mellitus, dyslipidemia, hypertension and a sedentary lifestyle.  Hypertension  This is a chronic problem. The current episode started more than 1 year ago. Pertinent negatives include no chest pain, headaches, palpitations or shortness of breath. Past treatments include ACE inhibitors. Hypertensive end-organ damage includes CVA.     Review of Systems  Constitutional: Negative for chills, fever and unexpected weight change.  HENT: Negative for trouble swallowing and voice change.   Eyes: Negative for visual disturbance.  Respiratory: Negative for cough,  shortness of breath and wheezing.   Cardiovascular: Negative for chest pain, palpitations and leg swelling.  Gastrointestinal: Negative for diarrhea, nausea and vomiting.  Endocrine: Negative for cold intolerance, heat intolerance, polydipsia, polyphagia and polyuria.  Musculoskeletal: Negative for arthralgias and myalgias.  Skin: Negative for color change, pallor, rash and wound.  Neurological: Negative for seizures and headaches.  Psychiatric/Behavioral: Negative for confusion and suicidal ideas.    Objective:    BP 139/65   Pulse 90   Ht 4\' 11"  (1.499 m)   Wt 135 lb (61.2 kg)   BMI 27.27 kg/m   Wt Readings from Last 3 Encounters:  10/22/16 135 lb (61.2 kg)  10/14/16 136 lb 11.2 oz (62 kg)  08/24/16 133 lb (60.3 kg)    Physical Exam  Constitutional: She is oriented to person, place, and time. She appears well-developed.  HENT:  Head: Normocephalic and atraumatic.  Eyes: EOM are normal.  Neck: Normal range of motion. Neck supple. No tracheal deviation present. No thyromegaly present.  Cardiovascular: Normal rate and regular rhythm.   Pulmonary/Chest: Effort normal and breath sounds normal.  Abdominal: Soft. Bowel sounds are normal. There is no tenderness. There is no guarding.  Musculoskeletal: Normal range of motion.  She exhibits no edema.  Neurological: She is alert and oriented to person, place, and time. She has normal reflexes. No cranial nerve deficit. Coordination normal.  Skin: Skin is warm and dry. No rash noted. No erythema. No pallor.  Psychiatric: She has a normal mood and affect. Judgment normal.     Lab Results  Component Value Date   WBC 10.9 (H) 02/13/2014   HGB 11.3 (L) 02/13/2014   HCT 33.4 (L) 02/13/2014   MCV 88.8 02/13/2014   PLT 225 02/13/2014   Chemistry (most recent): Lab Results  Component Value Date   NA 141 10/11/2016   K 4.4 10/11/2016   CL 105 10/11/2016   CO2 27 10/11/2016   BUN 26 (H) 10/11/2016   CREATININE 0.78 10/11/2016    Diabetic Labs (most recent): Lab Results  Component Value Date   HGBA1C 7.0 (H) 10/11/2016   HGBA1C 7.6 (H) 06/25/2016   HGBA1C 7.0 (H) 03/17/2016   Lipid Panel     Component Value Date/Time   CHOL 260 (H) 10/11/2016 1135   TRIG 98 10/11/2016 1135   HDL 96 10/11/2016 1135   CHOLHDL 2.7 10/11/2016 1135   VLDL 20 10/11/2016 1135   LDLCALC 144 (H) 10/11/2016 1135     Assessment & Plan:   1. 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 These are all discussed in detail with the patient.  Patient came with stable glucose profile, and  A1c has improved to 7% from 7.6% .  She  has  More fluctuating glucose profile than before.    - she is still unable to avoid simple carbohydrates like ice cream.  Glucose logs and insulin administration records pertaining to this visit,  to be scanned into patient's records.  Recent labs reviewed.   - I have re-counseled the patient on diet management   by adopting a carbohydrate restricted / protein rich  Diet.  - Suggestion is made for patient to avoid simple carbohydrates   from their diet including Cakes , Desserts, Ice Cream,  Soda (  diet and regular) , Sweet Tea , Candies,  Chips, Cookies, Artificial Sweeteners,   and "Sugar-free" Products .  This will help patient to have stable blood glucose profile and potentially avoid unintended  Weight gain.  - Patient is advised to stick to a routine mealtimes to eat 3 meals  a day and avoid unnecessary snacks ( to snack only to correct hypoglycemia).  - The patient  has been  scheduled with Norm Salt, RDN, CDE for individualized DM education.  - I have approached patient with the following individualized plan to manage diabetes and patient agrees.  - She has visual limitation and  inconsistent meal pattern. - She is not a suitable candidate for insulin pump, however she may benefit from continued glucose monitoring. I gave her a brochure any information on DEXCOM. -  I will lower  Levemir  to 30units qhs, increase Novolog to 12 units TIDAC plus SSI, starting at 70mg  /dl. 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 -Patient is not a candidate for metformin,SGLT2 inhibitors, and incretin therapy.  - Patient specific target  for A1c; LDL, HDL, Triglycerides, and  Waist Circumference were discussed in  detail.  2) BP/HTN: Uncontrolled, patient claims she has controlled blood pressure at home. Continue current medications including ACEI/ARB. 3) Lipids/HPL:  continue statins. 4)  Weight/Diet: CDE consult in progress, exercise, and carbohydrates information provided.  5) Chronic Care/Health Maintenance:  -Patient is on ACEI/ARB and Statin medications and encouraged to continue to follow up with Ophthalmology, Podiatrist at least yearly or according to recommendations, and advised to  stay away from smoking. I have recommended yearly flu vaccine and pneumonia vaccination at least every 5 years; moderate intensity exercise for up to 150 minutes weekly; and  sleep for at least 7 hours a day.  - 25 minutes of time was spent on the care of this patient , 50% of which was applied for counseling on diabetes complications and their preventions.  - I advised patient to maintain close follow up with Nancy Smiles., Nancy Sutton for primary care needs.  Patient is asked to bring meter and  blood glucose logs during their next visit.   Follow up plan: -Return in about 3 months (around 01/19/2017) for follow up with pre-visit labs, meter, and logs.  Marquis Lunch, Nancy Sutton Phone: [phone]  Fax: [phone]   10/22/2016, 11:42 AM

## 2016-10-25 NOTE — Telephone Encounter (Signed)
Stool cards are negative. Results given to patient

## 2016-11-29 ENCOUNTER — Other Ambulatory Visit: Payer: Self-pay

## 2016-11-29 MED ORDER — INSULIN DETEMIR 100 UNIT/ML FLEXPEN: 50.0000 [IU] | PEN_INJECTOR | Freq: Every day | SUBCUTANEOUS | 2 refills | Status: DC

## 2016-12-07 DIAGNOSIS — E103299 Type 1 diabetes mellitus with mild nonproliferative diabetic retinopathy without macular edema, unspecified eye: Secondary | ICD-10-CM | POA: Diagnosis not present

## 2016-12-07 DIAGNOSIS — H401131 Primary open-angle glaucoma, bilateral, mild stage: Secondary | ICD-10-CM | POA: Diagnosis not present

## 2016-12-14 ENCOUNTER — Other Ambulatory Visit: Payer: Self-pay | Admitting: "Endocrinology

## 2016-12-22 DIAGNOSIS — I10 Essential (primary) hypertension: Secondary | ICD-10-CM | POA: Diagnosis not present

## 2016-12-22 DIAGNOSIS — E782 Mixed hyperlipidemia: Secondary | ICD-10-CM | POA: Diagnosis not present

## 2016-12-22 DIAGNOSIS — Z6826 Body mass index (BMI) 26.0-26.9, adult: Secondary | ICD-10-CM | POA: Diagnosis not present

## 2016-12-22 DIAGNOSIS — K219 Gastro-esophageal reflux disease without esophagitis: Secondary | ICD-10-CM | POA: Diagnosis not present

## 2016-12-22 DIAGNOSIS — Z1389 Encounter for screening for other disorder: Secondary | ICD-10-CM | POA: Diagnosis not present

## 2016-12-22 DIAGNOSIS — I639 Cerebral infarction, unspecified: Secondary | ICD-10-CM | POA: Diagnosis not present

## 2016-12-22 DIAGNOSIS — E1129 Type 2 diabetes mellitus with other diabetic kidney complication: Secondary | ICD-10-CM | POA: Diagnosis not present

## 2016-12-22 DIAGNOSIS — I6521 Occlusion and stenosis of right carotid artery: Secondary | ICD-10-CM | POA: Diagnosis not present

## 2016-12-22 DIAGNOSIS — E663 Overweight: Secondary | ICD-10-CM | POA: Diagnosis not present

## 2016-12-29 DIAGNOSIS — Z6826 Body mass index (BMI) 26.0-26.9, adult: Secondary | ICD-10-CM | POA: Diagnosis not present

## 2016-12-29 DIAGNOSIS — Z1389 Encounter for screening for other disorder: Secondary | ICD-10-CM | POA: Diagnosis not present

## 2016-12-29 DIAGNOSIS — Z0000 Encounter for general adult medical examination without abnormal findings: Secondary | ICD-10-CM | POA: Diagnosis not present

## 2017-01-05 ENCOUNTER — Encounter: Payer: Self-pay | Admitting: "Endocrinology

## 2017-01-05 DIAGNOSIS — I6523 Occlusion and stenosis of bilateral carotid arteries: Secondary | ICD-10-CM | POA: Diagnosis not present

## 2017-01-25 ENCOUNTER — Other Ambulatory Visit: Payer: Self-pay | Admitting: "Endocrinology

## 2017-01-25 DIAGNOSIS — E1059 Type 1 diabetes mellitus with other circulatory complications: Secondary | ICD-10-CM | POA: Diagnosis not present

## 2017-01-25 DIAGNOSIS — E1129 Type 2 diabetes mellitus with other diabetic kidney complication: Secondary | ICD-10-CM | POA: Diagnosis not present

## 2017-01-25 LAB — COMPREHENSIVE METABOLIC PANEL
ALT: 13 U/L (ref 6–29)
AST: 18 U/L (ref 10–35)
Albumin: 3.9 g/dL (ref 3.6–5.1)
Alkaline Phosphatase: 58 U/L (ref 33–130)
BUN: 19 mg/dL (ref 7–25)
CHLORIDE: 105 mmol/L (ref 98–110)
CO2: 26 mmol/L (ref 20–31)
CREATININE: 0.83 mg/dL (ref 0.60–0.93)
Calcium: 9.4 mg/dL (ref 8.6–10.4)
Glucose, Bld: 233 mg/dL — ABNORMAL HIGH (ref 65–99)
Potassium: 4.3 mmol/L (ref 3.5–5.3)
SODIUM: 139 mmol/L (ref 135–146)
TOTAL PROTEIN: 6.8 g/dL (ref 6.1–8.1)
Total Bilirubin: 0.4 mg/dL (ref 0.2–1.2)

## 2017-01-26 LAB — HEMOGLOBIN A1C
Hgb A1c MFr Bld: 7.1 % — ABNORMAL HIGH (ref <5.7)
Mean Plasma Glucose: 157 mg/dL

## 2017-02-03 ENCOUNTER — Ambulatory Visit (INDEPENDENT_AMBULATORY_CARE_PROVIDER_SITE_OTHER): Payer: Medicare Other | Admitting: "Endocrinology

## 2017-02-03 ENCOUNTER — Encounter: Payer: Self-pay | Admitting: "Endocrinology

## 2017-02-03 VITALS — BP 136/75 | HR 75 | Ht 59.0 in | Wt 137.0 lb

## 2017-02-03 DIAGNOSIS — E782 Mixed hyperlipidemia: Secondary | ICD-10-CM | POA: Diagnosis not present

## 2017-02-03 DIAGNOSIS — E1059 Type 1 diabetes mellitus with other circulatory complications: Secondary | ICD-10-CM | POA: Diagnosis not present

## 2017-02-03 DIAGNOSIS — I10 Essential (primary) hypertension: Secondary | ICD-10-CM | POA: Diagnosis not present

## 2017-02-03 MED ORDER — INSULIN DETEMIR 100 UNIT/ML FLEXPEN: 30.0000 [IU] | PEN_INJECTOR | Freq: Every day | SUBCUTANEOUS | 2 refills | Status: DC

## 2017-02-03 NOTE — Patient Instructions (Signed)

## 2017-02-03 NOTE — Progress Notes (Signed)
Subjective:    Patient ID: Nancy Sutton, female    DOB: 18-Sep-1946, PCP Elfredia NevinsFusco, Lawrence, MD   Past Medical History:  Diagnosis Date  . Arthritis   . Diabetes mellitus    since 2008  . Essential hypertension, benign 09/10/2015  . GERD (gastroesophageal reflux disease)   . Hypercholesteremia   . Hyperlipidemia   . Stroke (HCC) 03/2011   only deficits-affected eyesight   Past Surgical History:  Procedure Laterality Date  . CATARACT EXTRACTION W/PHACO  11/01/2011   Procedure: CATARACT EXTRACTION PHACO AND INTRAOCULAR LENS PLACEMENT (IOC);  Surgeon: Gemma PayorKerry Hunt, MD;  Location: [facility];  Service: Ophthalmology;  Laterality: Right;  CDE 12.48  . ESOPHAGOGASTRODUODENOSCOPY (EGD) WITH ESOPHAGEAL DILATION N/A 02/14/2013   Procedure: ESOPHAGOGASTRODUODENOSCOPY (EGD) WITH ESOPHAGEAL DILATION;  Surgeon: Malissa HippoNajeeb U Rehman, MD;  Location: [facility];  Service: Endoscopy;  Laterality: N/A;  200-moved to 255 Ann to notify pt  . EYE SURGERY  10-2010   left eye removal of cataract  . TUBAL LIGATION  1984   Social History   Social History  . Marital status: Married    Spouse name: wayne  . Number of children: 2  . Years of education: 12   Occupational History  . 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 Smoking status: Never Smoker  . Smokeless tobacco: Never Used  . Alcohol use No  . Drug use: No  . Sexual activity: Yes    Birth control/ protection: None   Other Topics Concern  . None   Social History Narrative   Patient is married with 2 children.   Patient is right handed.   Patient has hs education.   Patient drinks 3-5 glasses of tea daily.   Outpatient Encounter Prescriptions as of 02/03/2017  Medication Sig  . clopidogrel (PLAVIX) 75 MG tablet Take 1 tablet (75 mg total) by mouth daily.  Marland Kitchen. GLOBAL EASE INJECT PEN NEEDLES 32G X 4 MM MISC USE 4 PENTIPS EVERY DAY  . GLOBAL INJECT EASE LANCETS 30G MISC DIABETIC CLUB  . glucose blood (TRUE  METRIX BLOOD GLUCOSE TEST) test strip Use as instructed 4 x daily  . insulin aspart (NOVOLOG FLEXPEN) 100 UNIT/ML FlexPen Inject 12-18 Units into the skin 3 (three) times daily with meals. Do not inject if premeal glucose is below 70  . Insulin Detemir (LEVEMIR FLEXTOUCH) 100 UNIT/ML Pen Inject 30 Units into the skin at bedtime.  Marland Kitchen. l-methylfolate-B6-B12 (METANX) 3-35-2 MG TABS Take 1 tablet by mouth 2 (two) times daily.  Marland Kitchen. losartan (COZAAR) 25 MG tablet Take 25 mg by mouth daily. Reported on 10/15/2015  . Multiple Vitamin (ONE-A-DAY 55 PLUS PO) Take by mouth daily.  . pantoprazole (PROTONIX) 40 MG tablet Take 1 tablet (40 mg total) by mouth 2 (two) times daily before a meal.  . ranitidine (ZANTAC) 75 MG tablet Take 2 tablets (150 mg total) by mouth daily as needed for heartburn.  . simvastatin (ZOCOR) 40 MG tablet Take 40 mg by mouth every evening.  . vitamin C (ASCORBIC ACID) 500 MG tablet Take 500 mg by mouth daily.  . vitamin E (VITAMIN E) 400 UNIT capsule Take 400 Units by mouth daily.  . [DISCONTINUED] Insulin Detemir (LEVEMIR FLEXTOUCH) 100 UNIT/ML Pen Inject 50 Units into the skin at bedtime.   No facility-administered encounter medications on file as of 02/03/2017.    ALLERGIES: Allergies  Allergen Reactions  . Ampicillin Other (See Comments)  Breaks out in bumps  . Bromday [Bromfenac Sodium] Itching and Swelling  . Tetanus Toxoids Other (See Comments)    Breaks out in bumps   VACCINATION STATUS: Immunization History  Administered Date(s) Administered  . Rabies, IM 02/13/2014, 02/16/2014, 02/20/2014, 02/27/2014    Diabetes  She presents for her follow-up diabetic visit. She has type 1 diabetes mellitus. Onset time: She was diagnosed at approximate age of 43 years. Her disease course has been stable. There are no hypoglycemic associated symptoms. Pertinent negatives for hypoglycemia include no confusion, headaches, pallor or seizures. There are no diabetic associated symptoms.  Pertinent negatives for diabetes include no chest pain, no polydipsia, no polyphagia and no polyuria. There are no hypoglycemic complications. Symptoms are stable. Diabetic complications include a CVA. Current diabetic treatment includes intensive insulin program. She is compliant with treatment some of the time. Her weight is stable. She is following a generally unhealthy diet. When asked about meal planning, she reported none. She has had a previous visit with a dietitian. She never participates in exercise. (She did not bring her meter nor logs today.) An ACE inhibitor/angiotensin II receptor blocker is being taken. Eye exam is current (She does have a visual deficit from her prior CVA.).  Hyperlipidemia  This is a chronic problem. The current episode started more than 1 year ago. Exacerbating diseases include diabetes. Pertinent negatives include no chest pain, myalgias or shortness of breath. Current antihyperlipidemic treatment includes statins. Risk factors for coronary artery disease include diabetes mellitus, dyslipidemia, hypertension and a sedentary lifestyle.  Hypertension  This is a chronic problem. The current episode started more than 1 year ago. Pertinent negatives include no chest pain, headaches, palpitations or shortness of breath. Past treatments include ACE inhibitors. Hypertensive end-organ damage includes CVA.     Review of Systems  Constitutional: Negative for chills, fever and unexpected weight change.  HENT: Negative for trouble swallowing and voice change.   Eyes: Negative for visual disturbance.  Respiratory: Negative for cough, shortness of breath and wheezing.   Cardiovascular: Negative for chest pain, palpitations and leg swelling.  Gastrointestinal: Negative for diarrhea, nausea and vomiting.  Endocrine: Negative for cold intolerance, heat intolerance, polydipsia, polyphagia and polyuria.  Musculoskeletal: Negative for arthralgias and myalgias.  Skin: Negative for  color change, pallor, rash and wound.  Neurological: Negative for seizures and headaches.  Psychiatric/Behavioral: Negative for confusion and suicidal ideas.    Objective:    BP 136/75   Pulse 75   Ht 4\' 11"  (1.499 m)   Wt 137 lb (62.1 kg)   BMI 27.67 kg/m   Wt Readings from Last 3 Encounters:  02/03/17 137 lb (62.1 kg)  10/22/16 135 lb (61.2 kg)  10/14/16 136 lb 11.2 oz (62 kg)    Physical Exam  Constitutional: She is oriented to person, place, and time. She appears well-developed.  HENT:  Head: Normocephalic and atraumatic.  Eyes: EOM are normal.  Neck: Normal range of motion. Neck supple. No tracheal deviation present. No thyromegaly present.  Cardiovascular: Normal rate and regular rhythm.   Pulmonary/Chest: Effort normal and breath sounds normal.  Abdominal: Soft. Bowel sounds are normal. There is no tenderness. There is no guarding.  Musculoskeletal: Normal range of motion. She exhibits no edema.  Neurological: She is alert and oriented to person, place, and time. She has normal reflexes. No cranial nerve deficit. Coordination normal.  Skin: Skin is warm and dry. No rash noted. No erythema. No pallor.  Psychiatric: She has a normal mood and  affect. Judgment normal.     Lab Results  Component Value Date   WBC 10.9 (H) 02/13/2014   HGB 11.3 (L) 02/13/2014   HCT 33.4 (L) 02/13/2014   MCV 88.8 02/13/2014   PLT 225 02/13/2014   Chemistry (most recent): Lab Results  Component Value Date   NA 139 01/25/2017   K 4.3 01/25/2017   CL 105 01/25/2017   CO2 26 01/25/2017   BUN 19 01/25/2017   CREATININE 0.83 01/25/2017   Diabetic Labs (most recent): Lab Results  Component Value Date   HGBA1C 7.1 (H) 01/25/2017   HGBA1C 7.0 (H) 10/11/2016   HGBA1C 7.6 (H) 06/25/2016   Lipid Panel     Component Value Date/Time   CHOL 260 (H) 10/11/2016 1135   TRIG 98 10/11/2016 1135   HDL 96 10/11/2016 1135   CHOLHDL 2.7 10/11/2016 1135   VLDL 20 10/11/2016 1135   LDLCALC 144  (H) 10/11/2016 1135     Assessment & Plan:   1. 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 These are all discussed in detail with the patient.  Patient came with stable glucose profile, and  A1c is stable at 7.1%, generally improving from 7.6% .    - she is still unable to avoid simple carbohydrates like ice cream.  Glucose logs and insulin administration records pertaining to this visit,  to be scanned into patient's records.  Recent labs reviewed.   - I have re-counseled the patient on diet management   by adopting a carbohydrate restricted / protein rich  Diet.  - Suggestion is made for patient to avoid simple carbohydrates   from their diet including Cakes , Desserts, Ice Cream,  Soda (  diet and regular) , Sweet Tea , Candies,  Chips, Cookies, Artificial Sweeteners,   and "Sugar-free" Products .  This will help patient to have stable blood glucose profile and potentially avoid unintended  Weight gain.  - Patient is advised to stick to a routine mealtimes to eat 3 meals  a day and avoid unnecessary snacks ( to snack only to correct hypoglycemia).  - The patient  has been  scheduled with Norm Salt, RDN, CDE for individualized DM education.  - I have approached patient with the following individualized plan to manage diabetes and patient agrees.  - She has visual limitation and inconsistent meal pattern. - She is not a suitable candidate for insulin pump, however she may benefit from continued glucose monitoring. I gave her a brochure any information on DEXCOM. -  I will  Continue  Levemir   30units qhs, Novolog  12 units TIDAC plus SSI, starting at 70mg  /dl. 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 -Patient is not a candidate for metformin,SGLT2 inhibitors, and incretin therapy.  - Patient specific target  for A1c; LDL, HDL, Triglycerides, and  Waist Circumference were discussed in detail.  2) BP/HTN: Uncontrolled, patient claims she has controlled blood pressure at home. Continue current medications including ACEI/ARB. 3) Lipids/HPL:  continue statins. 4)  Weight/Diet: CDE consult in progress, exercise, and carbohydrates information provided.  5) Chronic Care/Health Maintenance:  -Patient is on ACEI/ARB and Statin medications and encouraged to continue to follow up with Ophthalmology,  Podiatrist at least yearly or according to recommendations, and advised to  stay away from smoking. I have recommended yearly flu vaccine and pneumonia vaccination at least every 5 years; moderate intensity exercise for up to 150 minutes weekly; and  sleep for at least 7 hours a day.  - 25 minutes of time was spent on the care of this patient , 50% of which was applied for counseling on diabetes complications and their preventions.  - I advised patient to maintain close follow up with Elfredia Nevins, MD for primary care needs.  Patient is asked to bring meter and  blood glucose logs during their next visit.   Follow up plan: -Return in about 3 months (around 05/06/2017) for follow up with pre-visit labs, meter, and logs.  Marquis Lunch, MD Phone: [phone]  Fax: [phone]   02/03/2017, 10:38 AM

## 2017-02-08 ENCOUNTER — Other Ambulatory Visit: Payer: Self-pay | Admitting: "Endocrinology

## 2017-03-21 DIAGNOSIS — H4720 Unspecified optic atrophy: Secondary | ICD-10-CM | POA: Diagnosis not present

## 2017-03-21 DIAGNOSIS — H401131 Primary open-angle glaucoma, bilateral, mild stage: Secondary | ICD-10-CM | POA: Diagnosis not present

## 2017-04-11 ENCOUNTER — Other Ambulatory Visit (INDEPENDENT_AMBULATORY_CARE_PROVIDER_SITE_OTHER): Payer: Self-pay | Admitting: Internal Medicine

## 2017-04-11 ENCOUNTER — Encounter (INDEPENDENT_AMBULATORY_CARE_PROVIDER_SITE_OTHER): Payer: Self-pay | Admitting: Internal Medicine

## 2017-04-11 ENCOUNTER — Ambulatory Visit (INDEPENDENT_AMBULATORY_CARE_PROVIDER_SITE_OTHER): Payer: Medicare Other | Admitting: Internal Medicine

## 2017-04-11 VITALS — BP 160/58 | HR 80 | Temp 98.0°F | Ht 59.0 in | Wt 138.6 lb

## 2017-04-11 DIAGNOSIS — R1310 Dysphagia, unspecified: Secondary | ICD-10-CM

## 2017-04-11 DIAGNOSIS — R1319 Other dysphagia: Secondary | ICD-10-CM

## 2017-04-11 NOTE — Progress Notes (Signed)
Subjective:    Patient ID: Nancy Sutton, female    DOB: [birth year], 71 y.o.   MRN: [id number]  HPI Presents today with c/o that foods are lodging.  She was last seen by me in January of this year.  She feels like she is choking. It doesn't want to go down or come up. She will stretch to make the bolus go down. Symptoms for several weeks. Last EGD/ED in 2015.  Appetite is good. No weight loss.  Has a BM daily. Really unable to eat red meats   Hx of diabetes since 2008.   02/14/2013 EGD with ED.  Indications: Patient is 71 year old Caucasian female with history of GERD who presents with 2-3 month history of solid food dysphagia. 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 This stricture was dilated with a balloon to 18 mm. Small sliding hiatal hernia. Small polyp there are cold biopsy from proximal esophagus possibly a papilloma.     Review of Systems Past Medical History:  Diagnosis Date  . Arthritis   . Diabetes mellitus    since 2008  . Essential hypertension, benign 09/10/2015  . GERD (gastroesophageal reflux disease)   . Hypercholesteremia   . Hyperlipidemia   . 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 CATARACT EXTRACTION W/PHACO  11/01/2011   Procedure: CATARACT EXTRACTION PHACO AND INTRAOCULAR LENS PLACEMENT (IOC);  Surgeon: Gemma PayorKerry Hunt, MD;  Location: [facility];  Service: Ophthalmology;  Laterality: Right;  CDE 12.48  . ESOPHAGOGASTRODUODENOSCOPY (EGD) WITH ESOPHAGEAL DILATION N/A 02/14/2013   Procedure: ESOPHAGOGASTRODUODENOSCOPY (EGD) WITH ESOPHAGEAL DILATION;  Surgeon: Malissa HippoNajeeb U Rehman, MD;  Location: [facility];  Service: Endoscopy;  Laterality: N/A;  200-moved to 255 Ann to notify pt  . EYE SURGERY  10-2010   left eye removal of cataract  .  TUBAL LIGATION  1984    Allergies  Allergen Reactions  . Ampicillin Other (See Comments)    Breaks out in bumps  . Bromday [Bromfenac Sodium] Itching and Swelling  . Tetanus Toxoids Other (See Comments)    Breaks out in bumps    Current Outpatient Prescriptions on File Prior to Visit  Medication Sig Dispense Refill  . clopidogrel (PLAVIX) 75 MG tablet Take 1 tablet (75 mg total) by mouth daily. 90 tablet 3  . l-methylfolate-B6-B12 (METANX) 3-35-2 MG TABS Take 1 tablet by mouth 2 (two) times daily.    Marland Kitchen. LEVEMIR FLEXTOUCH 100 UNIT/ML Pen INJECT 50 UNITS INTO THE SKIN AT BEDTIME. (Patient taking differently: INJECT 30 UNITS INTO THE SKIN AT BEDTIME.) 15 mL 2  . losartan (COZAAR) 25 MG tablet Take 25 mg by mouth daily. Reported on 10/15/2015    . Multiple Vitamin (ONE-A-DAY 55 PLUS PO) Take by mouth daily.    . pantoprazole (PROTONIX) 40 MG tablet Take 1 tablet (40 mg total) by mouth 2 (two) times daily before a meal. 180 tablet 4  . ranitidine (ZANTAC) 75 MG tablet Take 2 tablets (150 mg total) by mouth daily as needed for heartburn.    . simvastatin (ZOCOR) 40 MG tablet Take 40 mg by mouth every evening.    . vitamin C (ASCORBIC ACID) 500 MG tablet Take 500 mg by mouth daily.    . vitamin E (VITAMIN E) 400 UNIT capsule Take 400 Units by mouth daily.    Marland Kitchen. GLOBAL EASE INJECT PEN NEEDLES 32G X 4 MM MISC USE 4 PENTIPS EVERY DAY 100  each 5  . GLOBAL INJECT EASE LANCETS 30G MISC DIABETIC CLUB 100 each 12  . glucose blood (TRUE METRIX BLOOD GLUCOSE TEST) test strip Use as instructed 4 x daily 150 each 11  . insulin aspart (NOVOLOG FLEXPEN) 100 UNIT/ML FlexPen Inject 12-18 Units into the skin 3 (three) times daily with meals. Do not inject if premeal glucose is below 70 15 pen 1   No current facility-administered medications on file prior to visit.         Objective:   Physical Exam Blood pressure (!) 160/58, pulse 80, temperature 98 F (36.7 C), height 4\' 11"  (1.499 m), weight 138 lb 9.6 oz  (62.9 kg). Alert and oriented. Skin warm and dry. Oral mucosa is moist.   . Sclera anicteric, conjunctivae is pink. Thyroid not enlarged. No cervical lymphadenopathy. Lungs clear. Heart regular rate and rhythm.  Abdomen is soft. Bowel sounds are positive. No hepatomegaly. No abdominal masses felt. No tenderness.  No edema to lower extremities.            Assessment & Plan:  EGD/ED. The risks of bleeding, perforation and infection were reviewed with patient.

## 2017-04-11 NOTE — Patient Instructions (Signed)
The risks of bleeding, perforation and infection were reviewed with patient.  

## 2017-04-12 ENCOUNTER — Encounter (INDEPENDENT_AMBULATORY_CARE_PROVIDER_SITE_OTHER): Payer: Self-pay | Admitting: *Deleted

## 2017-04-12 ENCOUNTER — Telehealth (INDEPENDENT_AMBULATORY_CARE_PROVIDER_SITE_OTHER): Payer: Self-pay | Admitting: *Deleted

## 2017-04-12 DIAGNOSIS — R1310 Dysphagia, unspecified: Secondary | ICD-10-CM | POA: Insufficient documentation

## 2017-04-12 DIAGNOSIS — R1319 Other dysphagia: Secondary | ICD-10-CM | POA: Insufficient documentation

## 2017-04-12 NOTE — Telephone Encounter (Signed)
Sp[facility] AMR CorporationSherri [facility] and per Darden RestaurantsShuvon Rankin it is ok for patient to hold Plavix 5 days prior to EGD/ED sch'd 04/29/17, patient aware

## 2017-04-19 ENCOUNTER — Ambulatory Visit (INDEPENDENT_AMBULATORY_CARE_PROVIDER_SITE_OTHER): Payer: Medicare Other | Admitting: Internal Medicine

## 2017-04-29 ENCOUNTER — Encounter (HOSPITAL_COMMUNITY): Payer: Self-pay | Admitting: *Deleted

## 2017-04-29 ENCOUNTER — Ambulatory Visit (HOSPITAL_COMMUNITY)
Admission: RE | Admit: 2017-04-29 | Discharge: 2017-04-29 | Disposition: A | Discharge status: Home / Self Care | Payer: Medicare Other | Source: Ambulatory Visit | Attending: Internal Medicine | Admitting: Internal Medicine

## 2017-04-29 ENCOUNTER — Encounter (HOSPITAL_COMMUNITY)
Admission: RE | Disposition: A | Discharge status: Home / Self Care | Payer: Self-pay | Source: Ambulatory Visit | Attending: Internal Medicine

## 2017-04-29 DIAGNOSIS — K317 Polyp of stomach and duodenum: Secondary | ICD-10-CM | POA: Diagnosis not present

## 2017-04-29 DIAGNOSIS — R1319 Other dysphagia: Secondary | ICD-10-CM | POA: Insufficient documentation

## 2017-04-29 DIAGNOSIS — M1990 Unspecified osteoarthritis, unspecified site: Secondary | ICD-10-CM | POA: Insufficient documentation

## 2017-04-29 DIAGNOSIS — Z9841 Cataract extraction status, right eye: Secondary | ICD-10-CM | POA: Diagnosis not present

## 2017-04-29 DIAGNOSIS — Z888 Allergy status to other drugs, medicaments and biological substances status: Secondary | ICD-10-CM | POA: Diagnosis not present

## 2017-04-29 DIAGNOSIS — I10 Essential (primary) hypertension: Secondary | ICD-10-CM | POA: Insufficient documentation

## 2017-04-29 DIAGNOSIS — K2270 Barrett's esophagus without dysplasia: Secondary | ICD-10-CM | POA: Diagnosis not present

## 2017-04-29 DIAGNOSIS — R1314 Dysphagia, pharyngoesophageal phase: Secondary | ICD-10-CM | POA: Insufficient documentation

## 2017-04-29 DIAGNOSIS — K210 Gastro-esophageal reflux disease with esophagitis: Secondary | ICD-10-CM | POA: Insufficient documentation

## 2017-04-29 DIAGNOSIS — K219 Gastro-esophageal reflux disease without esophagitis: Secondary | ICD-10-CM | POA: Diagnosis not present

## 2017-04-29 DIAGNOSIS — R1310 Dysphagia, unspecified: Secondary | ICD-10-CM | POA: Insufficient documentation

## 2017-04-29 DIAGNOSIS — Z880 Allergy status to penicillin: Secondary | ICD-10-CM | POA: Diagnosis not present

## 2017-04-29 DIAGNOSIS — K449 Diaphragmatic hernia without obstruction or gangrene: Secondary | ICD-10-CM | POA: Diagnosis not present

## 2017-04-29 DIAGNOSIS — Z809 Family history of malignant neoplasm, unspecified: Secondary | ICD-10-CM | POA: Insufficient documentation

## 2017-04-29 DIAGNOSIS — H539 Unspecified visual disturbance: Secondary | ICD-10-CM | POA: Insufficient documentation

## 2017-04-29 DIAGNOSIS — Z887 Allergy status to serum and vaccine status: Secondary | ICD-10-CM | POA: Insufficient documentation

## 2017-04-29 DIAGNOSIS — Z79899 Other long term (current) drug therapy: Secondary | ICD-10-CM | POA: Insufficient documentation

## 2017-04-29 DIAGNOSIS — Z794 Long term (current) use of insulin: Secondary | ICD-10-CM | POA: Insufficient documentation

## 2017-04-29 DIAGNOSIS — I69398 Other sequelae of cerebral infarction: Secondary | ICD-10-CM | POA: Diagnosis not present

## 2017-04-29 DIAGNOSIS — E119 Type 2 diabetes mellitus without complications: Secondary | ICD-10-CM | POA: Insufficient documentation

## 2017-04-29 DIAGNOSIS — E7800 Pure hypercholesterolemia, unspecified: Secondary | ICD-10-CM | POA: Diagnosis not present

## 2017-04-29 DIAGNOSIS — K228 Other specified diseases of esophagus: Secondary | ICD-10-CM | POA: Diagnosis not present

## 2017-04-29 DIAGNOSIS — K222 Esophageal obstruction: Secondary | ICD-10-CM | POA: Insufficient documentation

## 2017-04-29 HISTORY — PX: ESOPHAGEAL DILATION: SHX303

## 2017-04-29 HISTORY — PX: ESOPHAGOGASTRODUODENOSCOPY: SHX5428

## 2017-04-29 LAB — GLUCOSE, CAPILLARY: GLUCOSE-CAPILLARY: 160 mg/dL — AB (ref 65–99)

## 2017-04-29 SURGERY — EGD (ESOPHAGOGASTRODUODENOSCOPY)
Anesthesia: Moderate Sedation

## 2017-04-29 MED ORDER — SODIUM CHLORIDE 0.9 % IV SOLN
INTRAVENOUS | Status: DC
  Administered 2017-04-29: 09:00:00 via INTRAVENOUS

## 2017-04-29 MED ORDER — STERILE WATER FOR IRRIGATION IR SOLN
Status: DC | PRN
  Administered 2017-04-29: 100 mL

## 2017-04-29 MED ORDER — LIDOCAINE VISCOUS 2 % MT SOLN
OROMUCOSAL | Status: AC
  Filled 2017-04-29: qty 15

## 2017-04-29 MED ORDER — MEPERIDINE HCL 50 MG/ML IJ SOLN
INTRAMUSCULAR | Status: DC | PRN
  Administered 2017-04-29 (×2): 25 mg via INTRAVENOUS

## 2017-04-29 MED ORDER — RANITIDINE HCL 75 MG PO TABS: 150.0000 mg | ORAL_TABLET | Freq: Two times a day (BID) | ORAL | Status: DC

## 2017-04-29 MED ORDER — MEPERIDINE HCL 50 MG/ML IJ SOLN
INTRAMUSCULAR | Status: AC
  Filled 2017-04-29: qty 1

## 2017-04-29 MED ORDER — MIDAZOLAM HCL 5 MG/5ML IJ SOLN
INTRAMUSCULAR | Status: DC | PRN
  Administered 2017-04-29: 1 mg via INTRAVENOUS
  Administered 2017-04-29: 2 mg via INTRAVENOUS
  Administered 2017-04-29: 1 mg via INTRAVENOUS
  Administered 2017-04-29: 2 mg via INTRAVENOUS

## 2017-04-29 MED ORDER — RANITIDINE HCL 75 MG PO TABS: 150.0000 mg | ORAL_TABLET | Freq: Every day | ORAL | Status: DC

## 2017-04-29 MED ORDER — MIDAZOLAM HCL 5 MG/5ML IJ SOLN
INTRAMUSCULAR | Status: AC
  Filled 2017-04-29: qty 10

## 2017-04-29 MED ORDER — LIDOCAINE VISCOUS 2 % MT SOLN
OROMUCOSAL | Status: DC | PRN
  Administered 2017-04-29: 4 mL via OROMUCOSAL

## 2017-04-29 NOTE — Discharge Instructions (Signed)
Resume clopidogrel on 05/01/2017. Resume other medications as before. Zantac OTC 150 mg by mouth daily at bedtime. No driving for 24 hours. Physician will call with biopsy results.  Esophagogastroduodenoscopy, Care After Refer to this sheet in the next few weeks. These instructions provide you with information about caring for yourself after your procedure. Your health care provider may also give you more specific instructions. Your treatment has been planned according to current medical practices, but problems sometimes occur. Call your health care provider if you have any problems or questions after your procedure. What can I expect after the procedure? After the procedure, it is common to have:  A sore throat.  Nausea.  Bloating.  Dizziness.  Fatigue.  Follow these instructions at home:  Do not eat or drink anything until the numbing medicine (local anesthetic) has worn off and your gag reflex has returned. You will know that the local anesthetic has worn off when you can swallow comfortably.  Do not drive for 24 hours if you received a medicine to help you relax (sedative).  If your health care provider took a tissue sample for testing during the procedure, make sure to get your test results. This is your responsibility. Ask your health care provider or the department performing the test when your results will be ready.  Keep all follow-up visits as told by your health care provider. This is important. Contact a health care provider if:  You cannot stop coughing.  You are not urinating.  You are urinating less than usual. Get help right away if:  You have trouble swallowing.  You cannot eat or drink.  You have throat or chest pain that gets worse.  You are dizzy or light-headed.  You faint.  You have nausea or vomiting.  You have chills.  You have a fever.  You have severe abdominal pain.  You have black, tarry, or bloody stools. This information is not  intended to replace advice given to you by your health care provider. Make sure you discuss any questions you have with your health care provider. Document Released: 08/23/2012 Document Revised: 02/12/2016 Document Reviewed: 07/31/2015 Elsevier Interactive Patient Education  2018 [employer]   Esophageal Dilatation Esophageal dilatation is a procedure to open a blocked or narrowed part of the esophagus. The esophagus is the long tube in your throat that carries food and liquid from your mouth to your stomach. The procedure is also called esophageal dilation. You may need this procedure if you have a buildup of scar tissue in your esophagus that makes it difficult, painful, or even impossible to swallow. This can be caused by gastroesophageal reflux disease (GERD). In rare cases, people need this procedure because they have cancer of the esophagus or a problem with the way food moves through the esophagus. Sometimes you may need to have another dilatation to enlarge the opening of the esophagus gradually. Tell a health care provider about:  Any allergies you have.  All medicines you are taking, including vitamins, herbs, eye drops, creams, and over-the-counter medicines.  Any problems you or family members have had with anesthetic medicines.  Any blood disorders you have.  Any surgeries you have had.  Any medical conditions you have.  Any antibiotic medicines you are required to take before dental procedures. What are the risks? Generally, this is a safe procedure. However, problems can occur and include:  Bleeding from a tear in the lining of the esophagus.  A hole (perforation) in the esophagus.  What  happens before the procedure?  Do not eat or drink anything after midnight on the night before the procedure or as directed by your health care provider.  Ask your health care provider about changing or stopping your regular medicines. This is especially important if you are taking  diabetes medicines or blood thinners.  Plan to have someone take you home after the procedure. What happens during the procedure?  You will be given a medicine that makes you relaxed and sleepy (sedative).  A medicine may be sprayed or gargled to numb the back of the throat.  Your health care provider can use various instruments to do an esophageal dilatation. During the procedure, the instrument used will be placed in your mouth and passed down into your esophagus. Options include: ? Simple dilators. This instrument is carefully placed in the esophagus to stretch it. ? Guided wire bougies. In this method, a flexible tube (endoscope) is used to insert a wire into the esophagus. The dilator is passed over this wire to enlarge the esophagus. Then the wire is removed. ? Balloon dilators. An endoscope with a small balloon at the end is passed down into the esophagus. Inflating the balloon gently stretches the esophagus and opens it up. What happens after the procedure?  Your blood pressure, heart rate, breathing rate, and blood oxygen level will be monitored often until the medicines you were given have worn off.  Your throat may feel slightly sore and will probably still feel numb. This will improve slowly over time.  You will not be allowed to eat or drink until the throat numbness has resolved.  If this is a same-day procedure, you may be allowed to go home once you have been able to drink, urinate, and sit on the edge of the bed without nausea or dizziness.  If this is a same-day procedure, you should have a friend or family member with you for the next 24 hours after the procedure. This information is not intended to replace advice given to you by your health care provider. Make sure you discuss any questions you have with your health care provider. Document Released: 10/28/2005 Document Revised: 02/12/2016 Document Reviewed: 01/16/2014 Elsevier Interactive Patient Education  2017  [employer]   Gastric Polyps A gastric polyp, also called a stomach polyp, is a growth on the lining of the stomach. Most polyps are not dangerous, but some can be harmful because of their size, location, or type. Polyps that can become harmful include:  Large polyps. These can turn into sores (ulcers). Ulcers can lead to stomach bleeding.  Polyps that block food from moving from the stomach to the small intestine (gastric outlet obstruction).  A type of polyp called an adenoma. This type of polyp can become cancerous.  What are the causes? Gastric polyps form when the lining of the stomach gets inflamed or damaged. Stomach inflammation and damage may be caused by:  A long-lasting stomach condition, such as gastritis.  Certain medicines used to reduce stomach acid.  An inherited condition called familial adenomatous polyposis.  What are the signs or symptoms? Usually, this condition does not cause any symptoms. If you do have symptoms, they may include:  Pain or tenderness in the abdomen.  Nausea.  Trouble eating or swallowing.  Blood in the stool.  Anemia.  How is this diagnosed? Gastric polyps are diagnosed with:  A medical procedure called endoscopy.  A lab test in which a part of the polyp is examined. This test is  done with a sample of polyp tissue (biopsy) taken during an endoscopy.  How is this treated? Treatment depends on the type, location, and size of the polyps. Treatment may involve:  Having the polyps checked regularly with an endoscopy.  Having the polyps removed with an endoscopy. This may be done if the polyps are harmful or can become harmful. Removing a polyp often prevents problems from developing.  Having the polyps removed with a surgery called a partial gastrectomy. This may be done in rare cases to remove very large polyps.  Treating the underlying condition that caused the polyps.  Follow these instructions at home:  Take  over-the-counter and prescription medicines only as told by your health care provider.  Keep all follow-up visits as told by your health care provider. This is important. Contact a health care provider if:  You develop new symptoms.  Your symptoms get worse. Get help right away if:  You vomit blood.  You have severe abdominal pain.  You cannot eat or drink.  You have blood in your stool. This information is not intended to replace advice given to you by your health care provider. Make sure you discuss any questions you have with your health care provider. Document Released: 08/23/2012 Document Revised: 01/26/2016 Document Reviewed: 09/21/2015 Elsevier Interactive Patient Education  2018 [employer] Inc.   Hiatal Hernia A hiatal hernia occurs when part of the stomach slides above the muscle that separates the abdomen from the chest (diaphragm). A person can be born with a hiatal hernia (congenital), or it may develop over time. In almost all cases of hiatal hernia, only the top part of the stomach pushes through the diaphragm. Many people have a hiatal hernia with no symptoms. The larger the hernia, the more likely it is that you will have symptoms. In some cases, a hiatal hernia allows stomach acid to flow back into the tube that carries food from your mouth to your stomach (esophagus). This may cause heartburn symptoms. Severe heartburn symptoms may mean that you have developed a condition called gastroesophageal reflux disease (GERD). What are the causes? This condition is caused by a weakness in the opening (hiatus) where the esophagus passes through the diaphragm to attach to the upper part of the stomach. A person may be born with a weakness in the hiatus, or a weakness can develop over time. What increases the risk? This condition is more likely to develop in:  Older people. Age is a major risk factor for a hiatal hernia, especially if you are over the age of 88.  Pregnant  women.  People who are overweight.  People who have frequent constipation.  What are the signs or symptoms? Symptoms of this condition usually develop in the form of GERD symptoms. Symptoms include:  Heartburn.  Belching.  Indigestion.  Trouble swallowing.  Coughing or wheezing.  Sore throat.  Hoarseness.  Chest pain.  Nausea and vomiting.  How is this diagnosed? This condition may be diagnosed during testing for GERD. Tests that may be done include:  X-rays of your stomach or chest.  An upper gastrointestinal (GI) series. This is an X-ray exam of your GI tract that is taken after you swallow a chalky liquid that shows up clearly on the X-ray.  Endoscopy. This is a procedure to look into your stomach using a thin, flexible tube that has a tiny camera and light on the end of it.  How is this treated? This condition may be treated by:  Dietary and  lifestyle changes to help reduce GERD symptoms.  Medicines. These may include: ? Over-the-counter antacids. ? Medicines that make your stomach empty more quickly. ? Medicines that block the production of stomach acid (H2 blockers). ? Stronger medicines to reduce stomach acid (proton pump inhibitors).  Surgery to repair the hernia, if other treatments are not helping.  If you have no symptoms, you may not need treatment. Follow these instructions at home: Lifestyle and activity  Do not use any products that contain nicotine or tobacco, such as cigarettes and e-cigarettes. If you need help quitting, ask your health care provider.  Try to achieve and maintain a healthy body weight.  Avoid putting pressure on your abdomen. Anything that puts pressure on your abdomen increases the amount of acid that may be pushed up into your esophagus. ? Avoid bending over, especially after eating. ? Raise the head of your bed by putting blocks under the legs. This keeps your head and esophagus higher than your stomach. ? Do not wear  tight clothing around your chest or stomach. ? Try not to strain when having a bowel movement, when urinating, or when lifting heavy objects. Eating and drinking  Avoid foods that can worsen GERD symptoms. These may include: ? Fatty foods, like fried foods. ? Citrus fruits, like oranges or lemon. ? Other foods and drinks that contain acid, like orange juice or tomatoes. ? Spicy food. ? Chocolate.  Eat frequent small meals instead of three large meals a day. This helps prevent your stomach from getting too full. ? Eat slowly. ? Do not lie down right after eating. ? Do not eat 1-2 hours before bed.  Do not drink beverages with caffeine. These include cola, coffee, cocoa, and tea.  Do not drink alcohol. General instructions  Take over-the-counter and prescription medicines only as told by your health care provider.  Keep all follow-up visits as told by your health care provider. This is important. Contact a health care provider if:  Your symptoms are not controlled with medicines or lifestyle changes.  You are having trouble swallowing.  You have coughing or wheezing that will not go away. Get help right away if:  Your pain is getting worse.  Your pain spreads to your arms, neck, jaw, teeth, or back.  You have shortness of breath.  You sweat for no reason.  You feel sick to your stomach (nauseous) or you vomit.  You vomit blood.  You have bright red blood in your stools.  You have black, tarry stools. This information is not intended to replace advice given to you by your health care provider. Make sure you discuss any questions you have with your health care provider. Document Released: 11/27/2003 Document Revised: 08/30/2016 Document Reviewed: 08/30/2016 Elsevier Interactive Patient Education  [employer].

## 2017-04-29 NOTE — H&P (Signed)
Nancy Sutton is an 71 y.o. female.   Chief Complaint: Patient is here for EGD and ED. HPI: Patient is 71 year old Caucasian female with history of GERD complicated by esophageal stricture who presents with 3-4 month history of dysphagia to solids. She points to lower sternal areas site of bolus obstruction. She says heartburn is well controlled with therapy. She denies nausea vomiting epigastric pain or melena. Last EGD with ED was in May 2014.  Past Medical History:  Diagnosis Date  . Arthritis   . Diabetes mellitus    since 2008  . Essential hypertension, benign 09/10/2015  . GERD (gastroesophageal reflux disease)   . Hypercholesteremia   . Hyperlipidemia   . 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 CATARACT EXTRACTION W/PHACO  11/01/2011   Procedure: CATARACT EXTRACTION PHACO AND INTRAOCULAR LENS PLACEMENT (IOC);  Surgeon: Gemma Payor, MD;  Location: [facility];  Service: Ophthalmology;  Laterality: Right;  CDE 12.48  . ESOPHAGOGASTRODUODENOSCOPY (EGD) WITH ESOPHAGEAL DILATION N/A 02/14/2013   Procedure: ESOPHAGOGASTRODUODENOSCOPY (EGD) WITH ESOPHAGEAL DILATION;  Surgeon: Malissa Hippo, MD;  Location: [facility];  Service: Endoscopy;  Laterality: N/A;  200-moved to 255 Ann to notify pt  . EYE SURGERY  10-2010   left eye removal of cataract  . TUBAL LIGATION  1984    Family History  Problem Relation Age of Onset  . Cancer Mother   . Cancer Father   . Anesthesia problems Neg Hx   . Hypotension Neg Hx   . Malignant hyperthermia Neg Hx   . Pseudochol deficiency Neg Hx    Social History:  reports that she has never smoked. She has never used smokeless tobacco. She reports that she does not drink alcohol or use drugs.  Allergies:  Allergies  Allergen Reactions  . Ampicillin Hives    Breaks out in bumps Has patient had a PCN reaction causing immediate rash, facial/tongue/throat swelling, SOB or  lightheadedness with hypotension: No Has patient had a PCN reaction causing severe rash involving mucus membranes or skin necrosis: Yes Has patient had a PCN reaction that required hospitalization: No Has patient had a PCN reaction occurring within the last 10 years: No If all of the above answers are "NO", then may proceed with Cephalosporin use.   Therese Sarah [Bromfenac Sodium] Itching and Swelling  . Tetanus Toxoids Other (See Comments)    Breaks out in bumps    Medications Prior to Admission  Medication Sig Dispense Refill  . calcium carbonate (TUMS - DOSED IN MG ELEMENTAL CALCIUM) 500 MG chewable tablet Chew 1 tablet by mouth at bedtime as needed for indigestion or heartburn.    . clopidogrel (PLAVIX) 75 MG tablet Take 1 tablet (75 mg total) by mouth daily. (Patient taking differently: Take 75 mg by mouth at bedtime. ) 90 tablet 3  . insulin aspart (NOVOLOG FLEXPEN) 100 UNIT/ML FlexPen Inject 12-18 Units into the skin 3 (three) times daily with meals. Do not inject if premeal glucose is below 70 (Patient taking differently: Inject 10-14 Units into the skin 3 (three) times daily with meals. Do not inject if premeal glucose is below 70) 15 pen 1  . l-methylfolate-B6-B12 (METANX) 3-35-2 MG TABS Take 1 tablet by mouth 2 (two) times daily.    Marland Kitchen latanoprost (XALATAN) 0.005 % ophthalmic solution Place 1 drop into the right eye at bedtime.  11  . LEVEMIR FLEXTOUCH 100 UNIT/ML Pen INJECT 50 UNITS INTO  THE SKIN AT BEDTIME. (Patient taking differently: INJECT 30 UNITS INTO THE SKIN AT BEDTIME.) 15 mL 2  . losartan (COZAAR) 25 MG tablet Take 25 mg by mouth daily. Reported on 10/15/2015    . Menthol-Methyl Salicylate (MUSCLE RUB EX) Apply 1 application topically daily as needed (arthritis). Armeniahina Gel    . Multiple Vitamin (ONE-A-DAY 55 PLUS PO) Take 1 tablet by mouth daily.     . naproxen sodium (ANAPROX) 220 MG tablet Take 220 mg by mouth daily as needed (pain).    . pantoprazole (PROTONIX) 40 MG tablet  Take 1 tablet (40 mg total) by mouth 2 (two) times daily before a meal. (Patient taking differently: Take 40 mg by mouth daily. May take an additional 40mg  as needed for acid reflux) 180 tablet 4  . ranitidine (ZANTAC) 150 MG tablet Take 150 mg by mouth daily as needed for heartburn. Before dinner    . simvastatin (ZOCOR) 40 MG tablet Take 40 mg by mouth every evening.    . vitamin C (ASCORBIC ACID) 500 MG tablet Take 500 mg by mouth daily.    . vitamin E (VITAMIN E) 400 UNIT capsule Take 400 Units by mouth daily.    Marland Kitchen. GLOBAL EASE INJECT PEN NEEDLES 32G X 4 MM MISC USE 4 PENTIPS EVERY DAY 100 each 5  . GLOBAL INJECT EASE LANCETS 30G MISC DIABETIC CLUB 100 each 12  . glucose blood (TRUE METRIX BLOOD GLUCOSE TEST) test strip Use as instructed 4 x daily 150 each 11    Results for orders placed or performed during the hospital encounter of 04/29/17 (from the past 48 hour(s))  Glucose, capillary     Status: Abnormal   Collection Time: 04/29/17  9:15 AM  Result Value Ref Range   Glucose-Capillary 160 (H) 65 - 99 mg/dL   No results found.  ROS  Blood pressure (!) 149/60, pulse 82, temperature 97.7 F (36.5 C), temperature source Oral, resp. rate 11, height 4\' 11"  (1.499 m), weight 138 lb (62.6 kg), SpO2 99 %. Physical Exam  Constitutional: She appears well-developed and well-nourished.  HENT:  Mouth/Throat: Oropharynx is clear and moist.  Eyes: Conjunctivae are normal. No scleral icterus.  Neck: No thyromegaly present.  Cardiovascular: Normal rate, regular rhythm and normal heart sounds.   No murmur heard. Respiratory: Effort normal and breath sounds normal.  GI: Soft. She exhibits no distension and no mass. There is no tenderness.  Musculoskeletal:  Atrophy of thenar muscles in both hands.  Lymphadenopathy:    She has no cervical adenopathy.  Neurological: She is alert.  Skin: Skin is warm and dry.     Assessment/Plan Solid food dysphagia in patient with chronic GERD and known  esophageal stricture. EGD with ED.  Lionel DecemberNajeeb Rehman, MD 04/29/2017, 9:30 AM

## 2017-04-29 NOTE — Op Note (Signed)
[facility] Patient Name: Nancy Sutton Denault Procedure Date: 04/29/2017 8:56 AM MRN: [id number] Date of Birth: [birth year] Attending MD: Lionel DecemberNajeeb Rehman , MD CSN: [id number] Age: 71 Admit Type: Outpatient Procedure:                Upper GI endoscopy Indications:              Esophageal dysphagia, Gastro-esophageal reflux                            disease Providers:                Lionel DecemberNajeeb Rehman, MD, Edrick Kinsammy Vaught, RN, Burke Keelsrisann                            Tilley, Technician Referring MD:             Elfredia NevinsLawrence Fusco, MD Medicines:                Lidocaine spray, Meperidine 50 mg IV, Midazolam 6                            mg IV Complications:            No immediate complications. Estimated Blood Loss:     Estimated blood loss was minimal. Procedure:                Pre-Anesthesia Assessment:                           - Prior to the procedure, a History and Physical                            was performed, and patient medications and                            allergies were reviewed. The patient's tolerance of                            previous anesthesia was also reviewed. The risks                            and benefits of the procedure and the sedation                            options and risks were discussed with the patient.                            All questions were answered, and informed consent                            was obtained. Prior Anticoagulants: The patient                            last took Plavix (clopidogrel) 5 days prior to the                            procedure.  ASA Grade Assessment: III - A patient                            with severe systemic disease. After reviewing the                            risks and benefits, the patient was deemed in                            satisfactory condition to undergo the procedure.                           After obtaining informed consent, the endoscope was                            passed under direct vision.  Throughout the                            procedure, the patient's blood pressure, pulse, and                            oxygen saturations were monitored continuously. The                            EG29-[id number] ([id number]) scope was introduced through the                            mouth, and advanced to the second part of duodenum.                            The upper GI endoscopy was accomplished without                            difficulty. The patient tolerated the procedure                            well. Scope In: 9:40:16 AM Scope Out: 9:51:44 AM Total Procedure Duration: 0 hours 11 minutes 28 seconds  Findings:      The proximal esophagus and mid esophagus were normal.      LA Grade A (one or more mucosal breaks less than 5 mm, not extending       between tops of 2 mucosal folds) esophagitis was found from 30 cm to GE       junction located at 33 cm.      There were esophageal mucosal changes suspicious for short-segment       Barrett's esophagus present in the distal esophagus. The maximum       longitudinal extent of these mucosal changes was 1 cm in length. Mucosa       was biopsied with a cold forceps for histology in a targeted manner at       32 cm from the incisors. One specimen bottle was sent to pathology.      One mild benign-appearing, intrinsic stenosis was found 33 cm from the       incisors. This measured 1.3 cm (  inner diameter) x less than one cm (in       length) and was traversed. A TTS dilator was passed through the scope.       Dilation with an 18-19-20 mm balloon dilator was performed to 18 mm and       19 mm. The dilation site was examined and showed complete resolution of       luminal narrowing and no perforation.      A 4 cm hiatal hernia was present.      A few small sessile polyps were found in the gastric fundus.      The entire examined stomach was normal.      The duodenal bulb and second portion of the duodenum were normal. Impression:               -  Normal proximal esophagus and mid esophagus.                           - LA Grade A reflux esophagitis.                           - Esophageal mucosal changes suspicious for                            short-segment Barrett's esophagus. Biopsied.                           - Benign-appearing esophageal stenosis. Dilated.                           - 4 cm hiatal hernia.                           - A few gastric polyps.                           - Normal stomach.                           - Normal duodenal bulb and second portion of the                            duodenum. Moderate Sedation:      Moderate (conscious) sedation was administered by the endoscopy nurse       and supervised by the endoscopist. The following parameters were       monitored: oxygen saturation, heart rate, blood pressure, CO2       capnography and response to care. Total physician intraservice time was       17 minutes. Recommendation:           - Patient has a contact number available for                            emergencies. The signs and symptoms of potential                            delayed complications were discussed with the  patient. Return to normal activities tomorrow.                            Written discharge instructions were provided to the                            patient.                           - Resume previous diet today.                           - Continue present medications.                           - Resume Plavix (clopidogrel) at prior dose in 2                            days.                           - Zantac OTC 150 mg by mouth daily at bedtime.                           - Await pathology results.                           - Repeat upper endoscopy after studies are complete. Procedure Code(s):        --- Professional ---                           [phone], Esophagogastroduodenoscopy, flexible,                            transoral; with transendoscopic  balloon dilation of                            esophagus (less than 30 mm diameter)                           43239, Esophagogastroduodenoscopy, flexible,                            transoral; with biopsy, single or multiple                           99152, Moderate sedation services provided by the                            same physician or other qualified health care                            professional performing the diagnostic or                            therapeutic service that the sedation supports,  requiring the presence of an independent trained                            observer to assist in the monitoring of the                            patient's level of consciousness and physiological                            status; initial 15 minutes of intraservice time,                            patient age 75 years or older Diagnosis Code(s):        --- Professional ---                           K22.8, Other specified diseases of esophagus                           K21.0, Gastro-esophageal reflux disease with                            esophagitis                           K22.2, Esophageal obstruction                           K44.9, Diaphragmatic hernia without obstruction or                            gangrene                           K31.7, Polyp of stomach and duodenum                           R13.14, Dysphagia, pharyngoesophageal phase CPT copyright 2016 American Medical Association. All rights reserved. The codes documented in this report are preliminary and upon coder review may  be revised to meet current compliance requirements. Lionel December, MD Lionel December, MD 04/29/2017 10:15:17 AM This report has been signed electronically. Number of Addenda: 0

## 2017-05-03 ENCOUNTER — Encounter (HOSPITAL_COMMUNITY): Payer: Self-pay | Admitting: Internal Medicine

## 2017-05-11 ENCOUNTER — Other Ambulatory Visit: Payer: Self-pay | Admitting: "Endocrinology

## 2017-05-11 DIAGNOSIS — E1129 Type 2 diabetes mellitus with other diabetic kidney complication: Secondary | ICD-10-CM | POA: Diagnosis not present

## 2017-05-11 DIAGNOSIS — E1059 Type 1 diabetes mellitus with other circulatory complications: Secondary | ICD-10-CM | POA: Diagnosis not present

## 2017-05-11 LAB — COMPREHENSIVE METABOLIC PANEL
ALBUMIN: 3.9 g/dL (ref 3.6–5.1)
ALT: 14 U/L (ref 6–29)
AST: 17 U/L (ref 10–35)
Alkaline Phosphatase: 58 U/L (ref 33–130)
BUN: 27 mg/dL — ABNORMAL HIGH (ref 7–25)
CALCIUM: 9.2 mg/dL (ref 8.6–10.4)
CHLORIDE: 104 mmol/L (ref 98–110)
CO2: 23 mmol/L (ref 20–32)
CREATININE: 0.8 mg/dL (ref 0.60–0.93)
GLUCOSE: 246 mg/dL — AB (ref 65–99)
Potassium: 4.3 mmol/L (ref 3.5–5.3)
SODIUM: 138 mmol/L (ref 135–146)
Total Bilirubin: 0.5 mg/dL (ref 0.2–1.2)
Total Protein: 6.5 g/dL (ref 6.1–8.1)

## 2017-05-12 LAB — HEMOGLOBIN A1C
Hgb A1c MFr Bld: 7.4 % — ABNORMAL HIGH (ref <5.7)
MEAN PLASMA GLUCOSE: 166 mg/dL

## 2017-05-13 ENCOUNTER — Encounter (INDEPENDENT_AMBULATORY_CARE_PROVIDER_SITE_OTHER): Payer: Self-pay | Admitting: Internal Medicine

## 2017-05-13 NOTE — Progress Notes (Signed)
Patient was given an appointment for 08/08/17 at 11:30am w/Terri Valetta Fuller, NP.  A letter was mailed to the patient.

## 2017-05-18 ENCOUNTER — Encounter: Payer: Self-pay | Admitting: "Endocrinology

## 2017-05-18 ENCOUNTER — Ambulatory Visit (INDEPENDENT_AMBULATORY_CARE_PROVIDER_SITE_OTHER): Payer: Medicare Other | Admitting: "Endocrinology

## 2017-05-18 VITALS — BP 132/69 | HR 96 | Ht 59.0 in | Wt 133.0 lb

## 2017-05-18 DIAGNOSIS — E782 Mixed hyperlipidemia: Secondary | ICD-10-CM | POA: Diagnosis not present

## 2017-05-18 DIAGNOSIS — E1059 Type 1 diabetes mellitus with other circulatory complications: Secondary | ICD-10-CM

## 2017-05-18 DIAGNOSIS — I10 Essential (primary) hypertension: Secondary | ICD-10-CM

## 2017-05-18 MED ORDER — FREESTYLE LIBRE READER DEVI: 1.0000 | Freq: Once | 0 refills | Status: AC

## 2017-05-18 MED ORDER — FREESTYLE LIBRE SENSOR SYSTEM MISC: 2 refills | Status: DC

## 2017-05-18 NOTE — Progress Notes (Signed)
Subjective:    Patient ID: Nancy Sutton, female    DOB: 04/18/1946, PCP Elfredia Nevins, MD   Past Medical History:  Diagnosis Date  . Arthritis   . Diabetes mellitus    since 2008  . Essential hypertension, benign 09/10/2015  . GERD (gastroesophageal reflux disease)   . Hypercholesteremia   . Hyperlipidemia   . Stroke (HCC) 03/2011   only deficits-affected eyesight   Past Surgical History:  Procedure Laterality Date  . CATARACT EXTRACTION W/PHACO  11/01/2011   Procedure: CATARACT EXTRACTION PHACO AND INTRAOCULAR LENS PLACEMENT (IOC);  Surgeon: Gemma Payor, MD;  Location: [facility];  Service: Ophthalmology;  Laterality: Right;  CDE 12.48  . ESOPHAGEAL DILATION N/A 04/29/2017   Procedure: ESOPHAGEAL DILATION;  Surgeon: Malissa Hippo, MD;  Location: [facility];  Service: Endoscopy;  Laterality: N/A;  . ESOPHAGOGASTRODUODENOSCOPY N/A 04/29/2017   Procedure: ESOPHAGOGASTRODUODENOSCOPY (EGD);  Surgeon: Malissa Hippo, MD;  Location: [facility];  Service: Endoscopy;  Laterality: N/A;  1015  . ESOPHAGOGASTRODUODENOSCOPY (EGD) WITH ESOPHAGEAL DILATION N/A 02/14/2013   Procedure: ESOPHAGOGASTRODUODENOSCOPY (EGD) WITH ESOPHAGEAL DILATION;  Surgeon: Malissa Hippo, MD;  Location: [facility];  Service: Endoscopy;  Laterality: N/A;  200-moved to 255 Ann to notify pt  . EYE SURGERY  10-2010   left eye removal of cataract  . TUBAL LIGATION  1984   Social History   Social History  . Marital status: Married    Spouse name: wayne  . Number of children: 2  . Years of education: 12   Occupational History  . 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 Smoking status: Never Smoker  . Smokeless tobacco: Never Used  . Alcohol use No  . Drug use: No  . Sexual activity: Yes    Birth control/ protection: None   Other Topics Concern  . None   Social History Narrative   Patient is married with 2 children.   Patient is right handed.   Patient  has hs education.   Patient drinks 3-5 glasses of tea daily.   Outpatient Encounter Prescriptions as of 05/18/2017  Medication Sig  . calcium carbonate (TUMS - DOSED IN MG ELEMENTAL CALCIUM) 500 MG chewable tablet Chew 1 tablet by mouth at bedtime as needed for indigestion or heartburn.  . clopidogrel (PLAVIX) 75 MG tablet Take 1 tablet (75 mg total) by mouth daily.  . Continuous Blood Gluc Receiver (FREESTYLE LIBRE READER) DEVI 1 Piece by Does not apply route once.  . Continuous Blood Gluc Sensor (FREESTYLE LIBRE SENSOR SYSTEM) MISC Use one sensor every 10 days.  Marland Kitchen GLOBAL EASE INJECT PEN NEEDLES 32G X 4 MM MISC USE 4 PENTIPS EVERY DAY  . GLOBAL INJECT EASE LANCETS 30G MISC DIABETIC CLUB  . glucose blood (TRUE METRIX BLOOD GLUCOSE TEST) test strip Use as instructed 4 x daily  . insulin aspart (NOVOLOG FLEXPEN) 100 UNIT/ML FlexPen Inject 12-18 Units into the skin 3 (three) times daily with meals. Do not inject if premeal glucose is below 70 (Patient taking differently: Inject 10-14 Units into the skin 3 (three) times daily with meals. Do not inject if premeal glucose is below 70)  . l-methylfolate-B6-B12 (METANX) 3-35-2 MG TABS Take 1 tablet by mouth 2 (two) times daily.  Marland Kitchen latanoprost (XALATAN) 0.005 % ophthalmic solution Place 1 drop into the right eye at bedtime.  Marland Kitchen LEVEMIR FLEXTOUCH 100 UNIT/ML Pen INJECT 50 UNITS INTO THE SKIN AT BEDTIME. (Patient  taking differently: INJECT 30 UNITS INTO THE SKIN AT BEDTIME.)  . losartan (COZAAR) 25 MG tablet Take 25 mg by mouth daily. Reported on 10/15/2015  . Menthol-Methyl Salicylate (MUSCLE RUB EX) Apply 1 application topically daily as needed (arthritis). Armeniahina Gel  . Multiple Vitamin (ONE-A-DAY 55 PLUS PO) Take 1 tablet by mouth daily.   . naproxen sodium (ANAPROX) 220 MG tablet Take 220 mg by mouth daily as needed (pain).  . pantoprazole (PROTONIX) 40 MG tablet Take 1 tablet (40 mg total) by mouth 2 (two) times daily before a meal. (Patient taking  differently: Take 40 mg by mouth daily. May take an additional 40mg  as needed for acid reflux)  . ranitidine (ZANTAC 75) 75 MG tablet Take 2 tablets (150 mg total) by mouth at bedtime.  . simvastatin (ZOCOR) 40 MG tablet Take 40 mg by mouth every evening.  . vitamin C (ASCORBIC ACID) 500 MG tablet Take 500 mg by mouth daily.  . vitamin E (VITAMIN E) 400 UNIT capsule Take 400 Units by mouth daily.   No facility-administered encounter medications on file as of 05/18/2017.    ALLERGIES: Allergies  Allergen Reactions  . Ampicillin Hives    Breaks out in bumps Has patient had a PCN reaction causing immediate rash, facial/tongue/throat swelling, SOB or lightheadedness with hypotension: No Has patient had a PCN reaction causing severe rash involving mucus membranes or skin necrosis: Yes Has patient had a PCN reaction that required hospitalization: No Has patient had a PCN reaction occurring within the last 10 years: No If all of the above answers are "NO", then may proceed with Cephalosporin use.   Therese Sarah. Bromday [Bromfenac Sodium] Itching and Swelling  . Tetanus Toxoids Other (See Comments)    Breaks out in bumps   VACCINATION STATUS: Immunization History  Administered Date(s) Administered  . Rabies, IM 02/13/2014, 02/16/2014, 02/20/2014, 02/27/2014    Diabetes  She presents for her follow-up diabetic visit. She has type 1 diabetes mellitus. Onset time: She was diagnosed at approximate age of 71 years. Her disease course has been stable. There are no hypoglycemic associated symptoms. Pertinent negatives for hypoglycemia include no confusion, headaches, pallor or seizures. There are no diabetic associated symptoms. Pertinent negatives for diabetes include no chest pain, no polydipsia, no polyphagia and no polyuria. There are no hypoglycemic complications. Symptoms are stable. Diabetic complications include a CVA. Current diabetic treatment includes intensive insulin program. She is compliant with  treatment some of the time. Her weight is decreasing steadily. She is following a generally unhealthy diet. When asked about meal planning, she reported none. She has had a previous visit with a dietitian. She never participates in exercise. Her breakfast blood glucose range is generally 140-180 mg/dl. Her lunch blood glucose range is generally 140-180 mg/dl. Her dinner blood glucose range is generally 140-180 mg/dl. Her bedtime blood glucose range is generally 140-180 mg/dl. Her overall blood glucose range is 140-180 mg/dl. An ACE inhibitor/angiotensin II receptor blocker is being taken. Eye exam is current (She does have a visual deficit from her prior CVA.).  Hyperlipidemia  This is a chronic problem. The current episode started more than 1 year ago. Exacerbating diseases include diabetes. Pertinent negatives include no chest pain, myalgias or shortness of breath. Current antihyperlipidemic treatment includes statins. Risk factors for coronary artery disease include diabetes mellitus, dyslipidemia, hypertension and a sedentary lifestyle.  Hypertension  This is a chronic problem. The current episode started more than 1 year ago. Pertinent negatives include no chest pain, headaches,  palpitations or shortness of breath. Past treatments include ACE inhibitors. Hypertensive end-organ damage includes CVA.    Review of Systems  Constitutional: Negative for chills, fever and unexpected weight change.  HENT: Negative for trouble swallowing and voice change.   Eyes: Negative for visual disturbance.  Respiratory: Negative for cough, shortness of breath and wheezing.   Cardiovascular: Negative for chest pain, palpitations and leg swelling.  Gastrointestinal: Negative for diarrhea, nausea and vomiting.  Endocrine: Negative for cold intolerance, heat intolerance, polydipsia, polyphagia and polyuria.  Musculoskeletal: Negative for arthralgias and myalgias.  Skin: Negative for color change, pallor, rash and  wound.  Neurological: Negative for seizures and headaches.  Psychiatric/Behavioral: Negative for confusion and suicidal ideas.    Objective:    BP 132/69   Pulse 96   Ht 4\' 11"  (1.499 m)   Wt 133 lb (60.3 kg)   BMI 26.86 kg/m   Wt Readings from Last 3 Encounters:  05/18/17 133 lb (60.3 kg)  04/29/17 138 lb (62.6 kg)  04/11/17 138 lb 9.6 oz (62.9 kg)    Physical Exam  Constitutional: She is oriented to person, place, and time. She appears well-developed.  HENT:  Head: Normocephalic and atraumatic.  Eyes: EOM are normal.  Neck: Normal range of motion. Neck supple. No tracheal deviation present. No thyromegaly present.  Cardiovascular: Normal rate and regular rhythm.   Pulmonary/Chest: Effort normal and breath sounds normal.  Abdominal: Soft. Bowel sounds are normal. There is no tenderness. There is no guarding.  Musculoskeletal: Normal range of motion. She exhibits no edema.  Neurological: She is alert and oriented to person, place, and time. She has normal reflexes. No cranial nerve deficit. Coordination normal.  Skin: Skin is warm and dry. No rash noted. No erythema. No pallor.  Psychiatric: She has a normal mood and affect. Judgment normal.     Lab Results  Component Value Date   WBC 10.9 (H) 02/13/2014   HGB 11.3 (L) 02/13/2014   HCT 33.4 (L) 02/13/2014   MCV 88.8 02/13/2014   PLT 225 02/13/2014   Chemistry (most recent): Lab Results  Component Value Date   NA 138 05/11/2017   K 4.3 05/11/2017   CL 104 05/11/2017   CO2 23 05/11/2017   BUN 27 (H) 05/11/2017   CREATININE 0.80 05/11/2017   Diabetic Labs (most recent): Lab Results  Component Value Date   HGBA1C 7.4 (H) 05/11/2017   HGBA1C 7.1 (H) 01/25/2017   HGBA1C 7.0 (H) 10/11/2016   Lipid Panel     Component Value Date/Time   CHOL 260 (H) 10/11/2016 1135   TRIG 98 10/11/2016 1135   HDL 96 10/11/2016 1135   CHOLHDL 2.7 10/11/2016 1135   VLDL 20 10/11/2016 1135   LDLCALC 144 (H) 10/11/2016 1135      Assessment & Plan:   1. 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 These are all discussed in detail with the patient.  Patient came with stable glucose profile, and  A1c is stable at 7.4%.    - she is still unable to avoid simple carbohydrates like ice cream, saying that " it doesn't affect her glucose readings".  Glucose logs and insulin administration records pertaining to this visit,  to be scanned into patient's records.  Recent labs reviewed.  - I have re-counseled the patient  on diet management   by adopting a carbohydrate restricted / protein rich  Diet.  - Suggestion is made for her to avoid simple carbohydrates  from her diet including Cakes, Sweet Desserts, Ice Cream, Soda (diet and regular), Sweet Tea, Candies, Chips, Cookies, Store Bought Juices, Alcohol in Excess of  1-2 drinks a day, Artificial Sweeteners, and "Sugar-free" Products. This will help patient to have stable blood glucose profile and potentially avoid unintended weight gain.   - Patient is advised to stick to a routine mealtimes to eat 3 meals  a day and avoid unnecessary snacks ( to snack only to correct hypoglycemia).  - I have approached patient with the following individualized plan to manage diabetes and patient agrees.  - She has visual limitation and inconsistent meal pattern. - She is not a suitable candidate for insulin pump, however she wii benefit from continued glucose monitoring. - She has not heard back from [facility] rep. - I discussed and initiated prescription for the [facility] device.  -  I will  Continue  Levemir   30 units qhs, Novolog 12 units 3 times a day before meals plus correction associated with strict monitoring of blood glucose 4 times a day-before meals and at bedtime.    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 -Patient is not a candidate for metformin,SGLT2 inhibitors, and incretin therapy.  - Patient specific target  for A1c; LDL, HDL, Triglycerides, and  Waist Circumference were discussed in detail.  2) BP/HTN: controlled, patient claims she has controlled blood pressure at home. Continue current medications including ACEI/ARB. 3) Lipids/HPL:   she has not been consistent taking her atorvastatin,   I have advised her to be more consistent and to continue atorvastatin 40 mg by mouth daily at bedtime.  4)  Weight/Diet: CDE consult in progress, exercise, and carbohydrates information provided.  5) Chronic Care/Health Maintenance:  -Patient is on ACEI/ARB and Statin medications and encouraged to continue to follow up with Ophthalmology, Podiatrist at least yearly or according to recommendations, and advised to  stay away from smoking. I have recommended yearly flu vaccine and pneumonia vaccination at least every 5 years; moderate intensity exercise for up to 150 minutes weekly; and  sleep for at least 7 hours a day.  - Time spent with the patient: 25 min, of which >50% was spent in reviewing her sugar logs , discussing her hypo- and hyper-glycemic episodes, reviewing her current and  previous labs and insulin doses and developing a plan to avoid hypo- and hyper-glycemia.    - I advised patient to maintain close follow up with Elfredia Nevins, MD for primary care needs.  Patient is asked to bring meter and  blood glucose logs during her next visit.   Follow up plan: -Return in about 3 months (around 08/18/2017) for meter, and logs.  Marquis Lunch, MD Phone: [phone]  Fax: [phone]   This note was partially dictated with voice recognition software. Similar sounding words can be transcribed inadequately or may not  be corrected upon  review.  05/18/2017, 10:52 AM

## 2017-06-08 ENCOUNTER — Encounter (INDEPENDENT_AMBULATORY_CARE_PROVIDER_SITE_OTHER): Payer: Self-pay | Admitting: Internal Medicine

## 2017-06-08 ENCOUNTER — Encounter (INDEPENDENT_AMBULATORY_CARE_PROVIDER_SITE_OTHER): Payer: Self-pay

## 2017-07-01 DIAGNOSIS — E103293 Type 1 diabetes mellitus with mild nonproliferative diabetic retinopathy without macular edema, bilateral: Secondary | ICD-10-CM | POA: Diagnosis not present

## 2017-07-01 DIAGNOSIS — H47293 Other optic atrophy, bilateral: Secondary | ICD-10-CM | POA: Diagnosis not present

## 2017-07-01 DIAGNOSIS — H401132 Primary open-angle glaucoma, bilateral, moderate stage: Secondary | ICD-10-CM | POA: Diagnosis not present

## 2017-08-03 DIAGNOSIS — E103293 Type 1 diabetes mellitus with mild nonproliferative diabetic retinopathy without macular edema, bilateral: Secondary | ICD-10-CM | POA: Diagnosis not present

## 2017-08-03 DIAGNOSIS — H47293 Other optic atrophy, bilateral: Secondary | ICD-10-CM | POA: Diagnosis not present

## 2017-08-03 DIAGNOSIS — H401131 Primary open-angle glaucoma, bilateral, mild stage: Secondary | ICD-10-CM | POA: Diagnosis not present

## 2017-08-08 ENCOUNTER — Ambulatory Visit (INDEPENDENT_AMBULATORY_CARE_PROVIDER_SITE_OTHER): Payer: Medicare Other | Admitting: Internal Medicine

## 2017-08-09 ENCOUNTER — Ambulatory Visit (INDEPENDENT_AMBULATORY_CARE_PROVIDER_SITE_OTHER): Payer: Medicare Other | Admitting: Internal Medicine

## 2017-08-09 ENCOUNTER — Encounter (INDEPENDENT_AMBULATORY_CARE_PROVIDER_SITE_OTHER): Payer: Self-pay | Admitting: Internal Medicine

## 2017-08-09 VITALS — BP 140/78 | HR 70 | Temp 97.7°F | Resp 18 | Ht 59.0 in | Wt 136.5 lb

## 2017-08-09 DIAGNOSIS — K2270 Barrett's esophagus without dysplasia: Secondary | ICD-10-CM | POA: Diagnosis not present

## 2017-08-09 DIAGNOSIS — K219 Gastro-esophageal reflux disease without esophagitis: Secondary | ICD-10-CM

## 2017-08-09 MED ORDER — DEXLANSOPRAZOLE 60 MG PO CPDR: 60.0000 mg | DELAYED_RELEASE_CAPSULE | Freq: Every day | ORAL | 5 refills | Status: DC

## 2017-08-09 NOTE — Patient Instructions (Signed)
Please call office with progress report in 3-4 weeks.  

## 2017-08-09 NOTE — Progress Notes (Signed)
Presenting complaint;  Follow-up for complicated GERD.  Database and subjective:  Patient is 71 year old Caucasian female who has chronic GERD.  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  Stricture was dilated to 18 mm.  She also had small sliding hiatal hernia.  Since patient was on Plavix she was begun on Dexilant.  It was not covered by her insurance.  She was therefore changed to pantoprazole. She presented again with dysphagia in August this year.  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  Stricture was dilated to 19 mm.  Biopsy from distal esophagus confirmed Barrett's mucosa.  She is here for scheduled visit.  She states she does well during the daytime but after supper she has regurgitation and heartburn and worse when she lies flat.  She is having to use Zantac but it does not help.  However she is not having dysphagia chest pain nausea vomiting or throat symptoms.  Her appetite remains good.  She denies abdominal pain melena or rectal bleeding.  She has osteoarthrosis.  She is on Aleve but she does not take it every day.  Her appetite is good and her weight has been stable.   Current Medications: Outpatient Encounter Medications as of 08/09/2017  Medication Sig  . calcium carbonate (TUMS - DOSED IN MG ELEMENTAL CALCIUM) 500 MG chewable tablet Chew 1 tablet by mouth at bedtime as needed for indigestion or heartburn.  . clopidogrel (PLAVIX) 75 MG tablet Take 1 tablet (75 mg total) by mouth daily.  . Continuous Blood Gluc Sensor (FREESTYLE LIBRE SENSOR SYSTEM) MISC Use one sensor every 10 days.  Marland Kitchen. GLOBAL EASE INJECT PEN NEEDLES 32G X 4 MM MISC USE 4 PENTIPS EVERY DAY  . GLOBAL INJECT EASE LANCETS 30G MISC DIABETIC CLUB  . glucose blood (TRUE METRIX BLOOD GLUCOSE TEST) test strip Use as instructed 4 x daily  .  l-methylfolate-B6-B12 (METANX) 3-35-2 MG TABS Take 1 tablet by mouth 2 (two) times daily.  Marland Kitchen. latanoprost (XALATAN) 0.005 % ophthalmic solution Place 1 drop into the right eye at bedtime.  Marland Kitchen. LEVEMIR FLEXTOUCH 100 UNIT/ML Pen INJECT 50 UNITS INTO THE SKIN AT BEDTIME. (Patient taking differently: INJECT 30 UNITS INTO THE SKIN AT BEDTIME.)  . losartan (COZAAR) 25 MG tablet Take 25 mg by mouth daily. Reported on 10/15/2015  . Menthol-Methyl Salicylate (MUSCLE RUB EX) Apply 1 application topically daily as needed (arthritis). [place]iahina Gel  . Multiple Vitamin (ONE-A-DAY 55 PLUS PO) Take 1 tablet by mouth daily.   . naproxen sodium (ANAPROX) 220 MG tablet Take 220 mg by mouth daily as needed (pain).  . NOVOLOG FLEXPEN 100 UNIT/ML FlexPen 10 - 14 units SQ daily before meals - three times daily.  . pantoprazole (PROTONIX) 40 MG tablet Take 1 tablet (40 mg total) by mouth 2 (two) times daily before a meal. (Patient taking differently: Take 40 mg by mouth daily. May take an additional 40mg  as needed for acid reflux)  . ranitidine (ZANTAC 75) 75 MG tablet Take 2 tablets (150 mg total) by mouth at bedtime.  . simvastatin (ZOCOR) 40 MG tablet Take 40 mg by mouth every evening.  . vitamin C (ASCORBIC ACID) 500 MG tablet Take 500 mg by mouth daily.  . vitamin E (VITAMIN E) 400 UNIT capsule Take 400 Units by mouth daily.  . insulin aspart (NOVOLOG FLEXPEN) 100 UNIT/ML FlexPen Inject  12-18 Units into the skin 3 (three) times daily with meals. Do not inject if premeal glucose is below 70 (Patient taking differently: Inject 10-14 Units into the skin 3 (three) times daily with meals. Do not inject if premeal glucose is below 70)   No facility-administered encounter medications on file as of 08/09/2017.      Objective: Blood pressure 140/78, pulse 70, temperature 97.7 F (36.5 C), temperature source Oral, resp. rate 18, height 4\' 11"  (1.499 m), weight 136 lb 8 oz (61.9 kg). Patient is alert and in no acute  distress. Conjunctiva is pink. Sclera is nonicteric Oropharyngeal mucosa is normal. No neck masses or thyromegaly noted. Cardiac exam with regular rhythm normal S1 and S2. No murmur or gallop noted. Lungs are clear to auscultation. Abdomen is symmetrical soft and nontender without organomegaly or masses. No LE edema or clubbing noted.   Assessment:  #1.  Chronic GERD complicated by short segment Barrett's esophagus as well as esophageal stricture which has been dilated twice most recently about 3 months ago.  Dysphagia has resolved completely.  GERD symptoms are not well controlled with combination of PPI and nightly Zantac.  She has tried dexlansoprazole in the past and did well.  Will try her on samples and if she does well we will try to get it authorized from her insurance company.   Plan:  Discontinue Zantac and pantoprazole. Begin dexlansoprazole 60 mg p.o. every morning.  4 weeks supply given. Prescription sent to her pharmacy. Physician will call with progress report in 4 weeks. Office visit in 6 months.

## 2017-08-22 ENCOUNTER — Telehealth (INDEPENDENT_AMBULATORY_CARE_PROVIDER_SITE_OTHER): Payer: Self-pay | Admitting: Internal Medicine

## 2017-08-22 NOTE — Telephone Encounter (Signed)
Patient was called and made aware that she has samples waiting for her at the front desk.

## 2017-08-22 NOTE — Telephone Encounter (Signed)
Please let patient know she has samples at front desk

## 2017-08-22 NOTE — Telephone Encounter (Signed)
Patient called, stated that Dr. Karilyn Cotaehman gave her Dexilant samples and a prescription.  She went to get the prescription and it's so expensive.  The pharmacist asked her to see if we can give her samples to carry her through the rest of the year and then they'll run her insurance again after the first of the year.  She stated that it's really working and she just can't afford it.  Pharmacist told her it may not be that expensive after the first of the year.  [phone]

## 2017-08-26 DIAGNOSIS — E1059 Type 1 diabetes mellitus with other circulatory complications: Secondary | ICD-10-CM | POA: Diagnosis not present

## 2017-08-27 LAB — HEMOGLOBIN A1C
EAG (MMOL/L): 10 (calc)
Hgb A1c MFr Bld: 7.9 % of total Hgb — ABNORMAL HIGH (ref <5.7)
Mean Plasma Glucose: 180 (calc)

## 2017-08-27 LAB — RENAL FUNCTION PANEL
Albumin: 3.7 g/dL (ref 3.6–5.1)
BUN: 21 mg/dL (ref 7–25)
CHLORIDE: 105 mmol/L (ref 98–110)
CO2: 25 mmol/L (ref 20–32)
Calcium: 9.5 mg/dL (ref 8.6–10.4)
Creat: 0.82 mg/dL (ref 0.60–0.93)
Glucose, Bld: 256 mg/dL — ABNORMAL HIGH (ref 65–99)
PHOSPHORUS: 3.6 mg/dL (ref 2.1–4.3)
POTASSIUM: 4.5 mmol/L (ref 3.5–5.3)
Sodium: 138 mmol/L (ref 135–146)

## 2017-08-28 ENCOUNTER — Other Ambulatory Visit: Payer: Self-pay | Admitting: "Endocrinology

## 2017-09-02 ENCOUNTER — Ambulatory Visit (INDEPENDENT_AMBULATORY_CARE_PROVIDER_SITE_OTHER): Payer: Medicare Other | Admitting: "Endocrinology

## 2017-09-02 ENCOUNTER — Encounter: Payer: Self-pay | Admitting: "Endocrinology

## 2017-09-02 VITALS — BP 136/75 | HR 81 | Ht 59.0 in | Wt 137.0 lb

## 2017-09-02 DIAGNOSIS — E1059 Type 1 diabetes mellitus with other circulatory complications: Secondary | ICD-10-CM

## 2017-09-02 DIAGNOSIS — I10 Essential (primary) hypertension: Secondary | ICD-10-CM | POA: Diagnosis not present

## 2017-09-02 DIAGNOSIS — E782 Mixed hyperlipidemia: Secondary | ICD-10-CM | POA: Diagnosis not present

## 2017-09-02 NOTE — Progress Notes (Signed)
Subjective:    Patient ID: Nancy Sutton, female    DOB: Jun 06, 1946, PCP Elfredia NevinsFusco, Lawrence, MD   Past Medical History:  Diagnosis Date  . Arthritis   . Diabetes mellitus    since 2008  . Essential hypertension, benign 09/10/2015  . GERD (gastroesophageal reflux disease)   . Hypercholesteremia   . Hyperlipidemia   . Stroke (HCC) 03/2011   only deficits-affected eyesight   Past Surgical History:  Procedure Laterality Date  . CATARACT EXTRACTION W/PHACO  11/01/2011   Procedure: CATARACT EXTRACTION PHACO AND INTRAOCULAR LENS PLACEMENT (IOC);  Surgeon: Gemma PayorKerry Hunt, MD;  Location: [facility];  Service: Ophthalmology;  Laterality: Right;  CDE 12.48  . ESOPHAGEAL DILATION N/A 04/29/2017   Procedure: ESOPHAGEAL DILATION;  Surgeon: Malissa Hippoehman, Najeeb U, MD;  Location: [facility];  Service: Endoscopy;  Laterality: N/A;  . ESOPHAGOGASTRODUODENOSCOPY N/A 04/29/2017   Procedure: ESOPHAGOGASTRODUODENOSCOPY (EGD);  Surgeon: Malissa Hippoehman, Najeeb U, MD;  Location: [facility];  Service: Endoscopy;  Laterality: N/A;  1015  . ESOPHAGOGASTRODUODENOSCOPY (EGD) WITH ESOPHAGEAL DILATION N/A 02/14/2013   Procedure: ESOPHAGOGASTRODUODENOSCOPY (EGD) WITH ESOPHAGEAL DILATION;  Surgeon: Malissa HippoNajeeb U Rehman, MD;  Location: [facility];  Service: Endoscopy;  Laterality: N/A;  200-moved to 255 Ann to notify pt  . EYE SURGERY  10-2010   left eye removal of cataract  . TUBAL LIGATION  1984   Social History   Socioeconomic History  . Marital status: Married    Spouse name: wayne  . Number of children: 2  . Years of education: 4312  . Highest education level: None  Social Needs  . Financial resource strain: None  . Food insecurity - worry: None  . Food insecurity - inability: None  . Transportation needs - medical: None  . Transportation needs - non-medical: None  Occupational History  . 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 Smoking status: Never Smoker  . Smokeless  tobacco: Never Used  Substance and Sexual Activity  . Alcohol use: No    Alcohol/week: 0.0 oz  . Drug use: No  . Sexual activity: Yes    Birth control/protection: None  Other Topics Concern  . None  Social History Narrative   Patient is married with 2 children.   Patient is right handed.   Patient has hs education.   Patient drinks 3-5 glasses of tea daily.   Outpatient Encounter Medications as of 09/02/2017  Medication Sig  . calcium carbonate (TUMS - DOSED IN MG ELEMENTAL CALCIUM) 500 MG chewable tablet Chew 1 tablet by mouth at bedtime as needed for indigestion or heartburn.  . clopidogrel (PLAVIX) 75 MG tablet Take 1 tablet (75 mg total) by mouth daily.  . Continuous Blood Gluc Sensor (FREESTYLE LIBRE SENSOR SYSTEM) MISC Use one sensor every 10 days.  Marland Kitchen. dexlansoprazole (DEXILANT) 60 MG capsule Take 1 capsule (60 mg total) by mouth daily before breakfast.  . GLOBAL EASE INJECT PEN NEEDLES 32G X 4 MM MISC USE 4 PENTIPS EVERY DAY  . GLOBAL INJECT EASE LANCETS 30G MISC DIABETIC CLUB  . glucose blood (TRUE METRIX BLOOD GLUCOSE TEST) test strip Use as instructed 4 x daily  . insulin aspart (NOVOLOG FLEXPEN) 100 UNIT/ML FlexPen Inject 12-18 Units into the skin 3 (three) times daily with meals. Do not inject if premeal glucose is below 70 (Patient taking differently: Inject 10-14 Units into the skin 3 (three) times daily with meals. Do not inject if premeal glucose  is below 70)  . l-methylfolate-B6-B12 (METANX) 3-35-2 MG TABS Take 1 tablet by mouth 2 (two) times daily.  Marland Kitchen latanoprost (XALATAN) 0.005 % ophthalmic solution Place 1 drop into the right eye at bedtime.  Marland Kitchen LEVEMIR FLEXTOUCH 100 UNIT/ML Pen INJECT 50 UNITS INTO THE SKIN AT BEDTIME.  Marland Kitchen losartan (COZAAR) 25 MG tablet Take 25 mg by mouth daily. Reported on 10/15/2015  . Menthol-Methyl Salicylate (MUSCLE RUB EX) Apply 1 application topically daily as needed (arthritis). [place] Gel  . Multiple Vitamin (ONE-A-DAY 55 PLUS PO) Take 1 tablet  by mouth daily.   . naproxen sodium (ANAPROX) 220 MG tablet Take 220 mg by mouth daily as needed (pain).  . NOVOLOG FLEXPEN 100 UNIT/ML FlexPen 10 - 14 units SQ daily before meals - three times daily.  . ranitidine (ZANTAC 75) 75 MG tablet Take 2 tablets (150 mg total) by mouth at bedtime.  . simvastatin (ZOCOR) 40 MG tablet Take 40 mg by mouth every evening.  . vitamin C (ASCORBIC ACID) 500 MG tablet Take 500 mg by mouth daily.  . vitamin E (VITAMIN E) 400 UNIT capsule Take 400 Units by mouth daily.   No facility-administered encounter medications on file as of 09/02/2017.    ALLERGIES: Allergies  Allergen Reactions  . Ampicillin Hives    Breaks out in bumps Has patient had a PCN reaction causing immediate rash, facial/tongue/throat swelling, SOB or lightheadedness with hypotension: No Has patient had a PCN reaction causing severe rash involving mucus membranes or skin necrosis: Yes Has patient had a PCN reaction that required hospitalization: No Has patient had a PCN reaction occurring within the last 10 years: No If all of the above answers are "NO", then may proceed with Cephalosporin use.   Therese Sarah [Bromfenac Sodium] Itching and Swelling  . Tetanus Toxoids Other (See Comments)    Breaks out in bumps   VACCINATION STATUS: Immunization History  Administered Date(s) Administered  . Rabies, IM 02/13/2014, 02/16/2014, 02/20/2014, 02/27/2014    Diabetes  She presents for her follow-up diabetic visit. She has type 1 diabetes mellitus. Onset time: She was diagnosed at approximate age of 22 years. Her disease course has been stable. There are no hypoglycemic associated symptoms. Pertinent negatives for hypoglycemia include no confusion, headaches, pallor or seizures. Associated symptoms include blurred vision. Pertinent negatives for diabetes include no chest pain, no polydipsia, no polyphagia and no polyuria. There are no hypoglycemic complications. Symptoms are stable. Diabetic  complications include a CVA. Current diabetic treatment includes intensive insulin program. She is compliant with treatment some of the time. Her weight is stable. She is following a generally unhealthy diet. When asked about meal planning, she reported none. She has had a previous visit with a dietitian. She never participates in exercise. Her breakfast blood glucose range is generally 140-180 mg/dl. Her lunch blood glucose range is generally 140-180 mg/dl. Her dinner blood glucose range is generally 180-200 mg/dl. Her bedtime blood glucose range is generally 140-180 mg/dl. Her overall blood glucose range is 140-180 mg/dl. An ACE inhibitor/angiotensin II receptor blocker is being taken. Eye exam is current (She does have a visual deficit from her prior CVA.).  Hyperlipidemia  This is a chronic problem. The current episode started more than 1 year ago. Exacerbating diseases include diabetes. Pertinent negatives include no chest pain, myalgias or shortness of breath. Current antihyperlipidemic treatment includes statins. Risk factors for coronary artery disease include diabetes mellitus, dyslipidemia, hypertension and a sedentary lifestyle.  Hypertension  This is a chronic  problem. The current episode started more than 1 year ago. Associated symptoms include blurred vision. Pertinent negatives include no chest pain, headaches, palpitations or shortness of breath. Past treatments include ACE inhibitors. Hypertensive end-organ damage includes CVA.    Review of Systems  Constitutional: Negative for chills, fever and unexpected weight change.  HENT: Negative for trouble swallowing and voice change.   Eyes: Positive for blurred vision. Negative for visual disturbance.  Respiratory: Negative for cough, shortness of breath and wheezing.   Cardiovascular: Negative for chest pain, palpitations and leg swelling.  Gastrointestinal: Negative for diarrhea, nausea and vomiting.  Endocrine: Negative for cold  intolerance, heat intolerance, polydipsia, polyphagia and polyuria.  Musculoskeletal: Negative for arthralgias and myalgias.  Skin: Negative for color change, pallor, rash and wound.  Neurological: Negative for seizures and headaches.  Psychiatric/Behavioral: Negative for confusion and suicidal ideas.    Objective:    BP 136/75   Pulse 81   Ht 4\' 11"  (1.499 m)   Wt 137 lb (62.1 kg)   BMI 27.67 kg/m   Wt Readings from Last 3 Encounters:  09/02/17 137 lb (62.1 kg)  08/09/17 136 lb 8 oz (61.9 kg)  05/18/17 133 lb (60.3 kg)    Physical Exam  Constitutional: She is oriented to person, place, and time. She appears well-developed.  HENT:  Head: Normocephalic and atraumatic.  Eyes: EOM are normal.  Neck: Normal range of motion. Neck supple. No tracheal deviation present. No thyromegaly present.  Cardiovascular: Normal rate and regular rhythm.  Pulmonary/Chest: Effort normal and breath sounds normal.  Abdominal: Soft. Bowel sounds are normal. There is no tenderness. There is no guarding.  Musculoskeletal: Normal range of motion. She exhibits no edema.  Neurological: She is alert and oriented to person, place, and time. She has normal reflexes. No cranial nerve deficit. Coordination normal.  Skin: Skin is warm and dry. No rash noted. No erythema. No pallor.  Psychiatric: She has a normal mood and affect. Judgment normal.     Lab Results  Component Value Date   WBC 10.9 (H) 02/13/2014   HGB 11.3 (L) 02/13/2014   HCT 33.4 (L) 02/13/2014   MCV 88.8 02/13/2014   PLT 225 02/13/2014   Chemistry (most recent): Lab Results  Component Value Date   NA 138 08/26/2017   K 4.5 08/26/2017   CL 105 08/26/2017   CO2 25 08/26/2017   BUN 21 08/26/2017   CREATININE 0.82 08/26/2017   Diabetic Labs (most recent): Lab Results  Component Value Date   HGBA1C 7.9 (H) 08/26/2017   HGBA1C 7.4 (H) 05/11/2017   HGBA1C 7.1 (H) 01/25/2017   Lipid Panel     Component Value Date/Time   CHOL 260  (H) 10/11/2016 1135   TRIG 98 10/11/2016 1135   HDL 96 10/11/2016 1135   CHOLHDL 2.7 10/11/2016 1135   VLDL 20 10/11/2016 1135   LDLCALC 144 (H) 10/11/2016 1135     Assessment & Plan:   1. 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 These are all discussed in detail with the patient.  Patient came with slightly above target fasting blood glucose profile A1c 7.9% slowly increasing from 7.1%.    - she is still unable to avoid simple carbohydrates like ice cream, saying that " it doesn't affect her glucose readings".  Glucose  logs and insulin administration records pertaining to this visit,  to be scanned into patient's records.  Recent labs reviewed.  - I have re-counseled the patient on diet management   by adopting a carbohydrate restricted / protein rich  Diet.  -  Suggestion is made for her to avoid simple carbohydrates  from her diet including Cakes, Sweet Desserts / Pastries, Ice Cream, Soda (diet and regular), Sweet Tea, Candies, Chips, Cookies, Store Bought Juices, Alcohol in Excess of  1-2 drinks a day, Artificial Sweeteners, and "Sugar-free" Products. This will help patient to have stable blood glucose profile and potentially avoid unintended weight gain.   - Patient is advised to stick to a routine mealtimes to eat 3 meals  a day and avoid unnecessary snacks ( to snack only to correct hypoglycemia).  - I have approached patient with the following individualized plan to manage diabetes and patient agrees.  - She has visual limitation and inconsistent meal pattern. She decided to not use her continuous glucose monitoring citing some itching on her skin around the sensor. - She is not a suitable candidate for insulin pump, however she would have benefited from continued  glucose monitoring.  -  I will  increase Levemir to 34 units daily at bedtime, continue NovoLog 12 units  3 times a day before meals plus correction associated with strict monitoring of blood glucose 4 times a day-before meals and at bedtime.   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 -Patient is not a candidate for metformin,SGLT2 inhibitors, and incretin therapy.  - Patient specific target  for A1c; LDL, HDL, Triglycerides, and  Waist Circumference were discussed in detail.  2) BP/HTN: controlled, patient claims she has controlled blood pressure at home. Continue current medications including ACEI/ARB. 3) Lipids/HPL:   she has not been consistent taking her atorvastatin,   I have advised her to be more consistent and to continue atorvastatin 40 mg by mouth daily at bedtime.  4)  Weight/Diet: CDE consult in progress, exercise, and carbohydrates information provided.  5) Chronic Care/Health Maintenance:  -Patient is on ACEI/ARB and Statin medications and encouraged to continue to follow up with Ophthalmology, Podiatrist at least yearly or according to recommendations, and advised to  stay away from smoking. I have recommended yearly flu vaccine and pneumonia vaccination at least every 5 years; moderate intensity exercise for up to 150 minutes weekly; and  sleep for at least 7 hours a day.  - Time spent with the patient: 25 min, of which >50% was spent in reviewing her sugar logs , discussing her hypo- and hyper-glycemic episodes, reviewing her current and  previous labs and insulin doses and developing a plan to avoid hypo- and hyper-glycemia.   - I advised patient to maintain close follow up with Elfredia NevinsFusco, Lawrence, MD for primary care needs.  Follow up plan: -Return in about 3 months (around 12/01/2017) for follow up with pre-visit labs, meter, and logs.  Marquis LunchGebre Debanhi Blaker, MD Phone:  [phone]  Fax: [phone]   This note was partially dictated with voice recognition software. Similar sounding words can be transcribed inadequately or may not  be corrected upon review.  09/02/2017, 10:39 AM

## 2017-09-09 ENCOUNTER — Other Ambulatory Visit: Payer: Self-pay | Admitting: "Endocrinology

## 2017-09-09 DIAGNOSIS — E1065 Type 1 diabetes mellitus with hyperglycemia: Secondary | ICD-10-CM

## 2017-09-30 DIAGNOSIS — E103293 Type 1 diabetes mellitus with mild nonproliferative diabetic retinopathy without macular edema, bilateral: Secondary | ICD-10-CM | POA: Diagnosis not present

## 2017-09-30 DIAGNOSIS — H47293 Other optic atrophy, bilateral: Secondary | ICD-10-CM | POA: Diagnosis not present

## 2017-09-30 DIAGNOSIS — H401131 Primary open-angle glaucoma, bilateral, mild stage: Secondary | ICD-10-CM | POA: Diagnosis not present

## 2017-10-25 ENCOUNTER — Ambulatory Visit (INDEPENDENT_AMBULATORY_CARE_PROVIDER_SITE_OTHER): Payer: Medicare Other | Admitting: Internal Medicine

## 2017-11-04 DIAGNOSIS — H47293 Other optic atrophy, bilateral: Secondary | ICD-10-CM | POA: Diagnosis not present

## 2017-11-04 DIAGNOSIS — H401132 Primary open-angle glaucoma, bilateral, moderate stage: Secondary | ICD-10-CM | POA: Diagnosis not present

## 2017-11-04 DIAGNOSIS — E103293 Type 1 diabetes mellitus with mild nonproliferative diabetic retinopathy without macular edema, bilateral: Secondary | ICD-10-CM | POA: Diagnosis not present

## 2017-11-09 ENCOUNTER — Other Ambulatory Visit: Payer: Self-pay | Admitting: "Endocrinology

## 2017-11-11 ENCOUNTER — Other Ambulatory Visit (INDEPENDENT_AMBULATORY_CARE_PROVIDER_SITE_OTHER): Payer: Self-pay | Admitting: *Deleted

## 2017-11-11 ENCOUNTER — Telehealth (INDEPENDENT_AMBULATORY_CARE_PROVIDER_SITE_OTHER): Payer: Self-pay | Admitting: *Deleted

## 2017-11-11 MED ORDER — PANTOPRAZOLE SODIUM 40 MG PO TBEC: 40.0000 mg | DELAYED_RELEASE_TABLET | Freq: Two times a day (BID) | ORAL | 3 refills | Status: DC

## 2017-11-11 NOTE — Telephone Encounter (Signed)
Per Dr. Karilyn Cotaehman patient may take the pantoprazole 40 mg bid. [facility].

## 2017-11-11 NOTE — Telephone Encounter (Signed)
A prescription was sent to the patient's pharmacy per Dr.Rehman's instruction.

## 2017-11-11 NOTE — Telephone Encounter (Signed)
Patient called stating the dexilant is the best medication ever made, patient states after her insurance pays it is 100$ and she can't afford that so Dr Rehman put her on the next best Karilyn Cotathing and that is pantoprazole 1 tablet daily. Patient would like to know if she can take 1 tablet with breakfast and dinner of the pantoprazole because on an old bottle she found that is how she used to take it and it seemed to work much better. [facility]. Please advise  [phone]

## 2017-11-24 DIAGNOSIS — Z6826 Body mass index (BMI) 26.0-26.9, adult: Secondary | ICD-10-CM | POA: Diagnosis not present

## 2017-11-24 DIAGNOSIS — Z1389 Encounter for screening for other disorder: Secondary | ICD-10-CM | POA: Diagnosis not present

## 2017-11-24 DIAGNOSIS — I63139 Cerebral infarction due to embolism of unspecified carotid artery: Secondary | ICD-10-CM | POA: Diagnosis not present

## 2017-11-24 DIAGNOSIS — E663 Overweight: Secondary | ICD-10-CM | POA: Diagnosis not present

## 2017-11-24 DIAGNOSIS — I6529 Occlusion and stenosis of unspecified carotid artery: Secondary | ICD-10-CM | POA: Diagnosis not present

## 2017-11-24 DIAGNOSIS — I639 Cerebral infarction, unspecified: Secondary | ICD-10-CM | POA: Diagnosis not present

## 2017-11-24 DIAGNOSIS — I10 Essential (primary) hypertension: Secondary | ICD-10-CM | POA: Diagnosis not present

## 2017-11-28 ENCOUNTER — Other Ambulatory Visit: Payer: Self-pay | Admitting: Internal Medicine

## 2017-11-28 ENCOUNTER — Other Ambulatory Visit (HOSPITAL_COMMUNITY): Payer: Self-pay | Admitting: Internal Medicine

## 2017-11-28 DIAGNOSIS — I6529 Occlusion and stenosis of unspecified carotid artery: Secondary | ICD-10-CM

## 2017-11-28 DIAGNOSIS — Z1231 Encounter for screening mammogram for malignant neoplasm of breast: Secondary | ICD-10-CM

## 2017-11-29 ENCOUNTER — Other Ambulatory Visit: Payer: Self-pay

## 2017-11-29 MED ORDER — INSULIN DETEMIR 100 UNIT/ML FLEXPEN: PEN_INJECTOR | SUBCUTANEOUS | 2 refills | Status: DC

## 2017-11-29 MED ORDER — INSULIN ASPART 100 UNIT/ML FLEXPEN: 12.0000 [IU] | PEN_INJECTOR | Freq: Three times a day (TID) | SUBCUTANEOUS | 2 refills | Status: DC

## 2017-12-02 DIAGNOSIS — Z1211 Encounter for screening for malignant neoplasm of colon: Secondary | ICD-10-CM | POA: Diagnosis not present

## 2017-12-05 ENCOUNTER — Other Ambulatory Visit: Payer: Self-pay | Admitting: Internal Medicine

## 2017-12-05 DIAGNOSIS — Z1231 Encounter for screening mammogram for malignant neoplasm of breast: Secondary | ICD-10-CM

## 2017-12-12 DIAGNOSIS — E103293 Type 1 diabetes mellitus with mild nonproliferative diabetic retinopathy without macular edema, bilateral: Secondary | ICD-10-CM | POA: Diagnosis not present

## 2017-12-12 DIAGNOSIS — H47293 Other optic atrophy, bilateral: Secondary | ICD-10-CM | POA: Diagnosis not present

## 2017-12-12 DIAGNOSIS — H401132 Primary open-angle glaucoma, bilateral, moderate stage: Secondary | ICD-10-CM | POA: Diagnosis not present

## 2017-12-22 ENCOUNTER — Ambulatory Visit: Payer: Medicare Other

## 2017-12-22 ENCOUNTER — Ambulatory Visit
Admission: RE | Admit: 2017-12-22 | Discharge: 2017-12-22 | Disposition: A | Discharge status: Home / Self Care | Payer: Medicare Other | Source: Ambulatory Visit | Attending: Internal Medicine | Admitting: Internal Medicine

## 2017-12-22 DIAGNOSIS — Z1231 Encounter for screening mammogram for malignant neoplasm of breast: Secondary | ICD-10-CM | POA: Diagnosis not present

## 2017-12-23 ENCOUNTER — Other Ambulatory Visit: Payer: Self-pay | Admitting: Internal Medicine

## 2017-12-23 DIAGNOSIS — R928 Other abnormal and inconclusive findings on diagnostic imaging of breast: Secondary | ICD-10-CM

## 2017-12-27 ENCOUNTER — Other Ambulatory Visit: Payer: Self-pay | Admitting: Internal Medicine

## 2017-12-27 ENCOUNTER — Ambulatory Visit: Payer: Medicare Other

## 2017-12-27 ENCOUNTER — Ambulatory Visit
Admission: RE | Admit: 2017-12-27 | Discharge: 2017-12-27 | Disposition: A | Discharge status: Home / Self Care | Payer: Medicare Other | Source: Ambulatory Visit | Attending: Internal Medicine | Admitting: Internal Medicine

## 2017-12-27 DIAGNOSIS — R921 Mammographic calcification found on diagnostic imaging of breast: Secondary | ICD-10-CM

## 2017-12-27 DIAGNOSIS — R928 Other abnormal and inconclusive findings on diagnostic imaging of breast: Secondary | ICD-10-CM

## 2018-01-04 DIAGNOSIS — I6523 Occlusion and stenosis of bilateral carotid arteries: Secondary | ICD-10-CM | POA: Diagnosis not present

## 2018-01-12 DIAGNOSIS — E1129 Type 2 diabetes mellitus with other diabetic kidney complication: Secondary | ICD-10-CM | POA: Diagnosis not present

## 2018-01-12 DIAGNOSIS — E1065 Type 1 diabetes mellitus with hyperglycemia: Secondary | ICD-10-CM | POA: Diagnosis not present

## 2018-01-12 LAB — COMPREHENSIVE METABOLIC PANEL
AG RATIO: 1.4 (calc) (ref 1.0–2.5)
ALKALINE PHOSPHATASE (APISO): 63 U/L (ref 33–130)
ALT: 13 U/L (ref 6–29)
AST: 16 U/L (ref 10–35)
Albumin: 4.1 g/dL (ref 3.6–5.1)
BILIRUBIN TOTAL: 0.5 mg/dL (ref 0.2–1.2)
BUN/Creatinine Ratio: 31 (calc) — ABNORMAL HIGH (ref 6–22)
BUN: 30 mg/dL — ABNORMAL HIGH (ref 7–25)
CALCIUM: 9.6 mg/dL (ref 8.6–10.4)
CO2: 26 mmol/L (ref 20–32)
Chloride: 105 mmol/L (ref 98–110)
Creat: 0.96 mg/dL — ABNORMAL HIGH (ref 0.60–0.93)
Globulin: 3 g/dL (calc) (ref 1.9–3.7)
Glucose, Bld: 212 mg/dL — ABNORMAL HIGH (ref 65–99)
Potassium: 4.2 mmol/L (ref 3.5–5.3)
SODIUM: 139 mmol/L (ref 135–146)
TOTAL PROTEIN: 7.1 g/dL (ref 6.1–8.1)

## 2018-01-13 LAB — HEMOGLOBIN A1C
HEMOGLOBIN A1C: 8.4 %{Hb} — AB (ref <5.7)
MEAN PLASMA GLUCOSE: 194 (calc)
eAG (mmol/L): 10.8 (calc)

## 2018-01-18 DIAGNOSIS — Z1389 Encounter for screening for other disorder: Secondary | ICD-10-CM | POA: Diagnosis not present

## 2018-01-18 DIAGNOSIS — Z0001 Encounter for general adult medical examination with abnormal findings: Secondary | ICD-10-CM | POA: Diagnosis not present

## 2018-01-18 DIAGNOSIS — I639 Cerebral infarction, unspecified: Secondary | ICD-10-CM | POA: Diagnosis not present

## 2018-01-18 DIAGNOSIS — Z6826 Body mass index (BMI) 26.0-26.9, adult: Secondary | ICD-10-CM | POA: Diagnosis not present

## 2018-01-18 DIAGNOSIS — B351 Tinea unguium: Secondary | ICD-10-CM | POA: Diagnosis not present

## 2018-01-18 DIAGNOSIS — E1129 Type 2 diabetes mellitus with other diabetic kidney complication: Secondary | ICD-10-CM | POA: Diagnosis not present

## 2018-01-19 ENCOUNTER — Ambulatory Visit (INDEPENDENT_AMBULATORY_CARE_PROVIDER_SITE_OTHER): Payer: Medicare Other | Admitting: "Endocrinology

## 2018-01-19 ENCOUNTER — Encounter: Payer: Self-pay | Admitting: "Endocrinology

## 2018-01-19 VITALS — BP 136/71 | HR 74 | Ht 59.0 in | Wt 137.0 lb

## 2018-01-19 DIAGNOSIS — I10 Essential (primary) hypertension: Secondary | ICD-10-CM | POA: Diagnosis not present

## 2018-01-19 DIAGNOSIS — E1065 Type 1 diabetes mellitus with hyperglycemia: Secondary | ICD-10-CM | POA: Diagnosis not present

## 2018-01-19 DIAGNOSIS — E782 Mixed hyperlipidemia: Secondary | ICD-10-CM

## 2018-01-19 DIAGNOSIS — I6529 Occlusion and stenosis of unspecified carotid artery: Secondary | ICD-10-CM | POA: Diagnosis not present

## 2018-01-19 DIAGNOSIS — E1129 Type 2 diabetes mellitus with other diabetic kidney complication: Secondary | ICD-10-CM | POA: Diagnosis not present

## 2018-01-19 NOTE — Progress Notes (Signed)
Subjective:    Patient ID: Nancy Sutton, female    DOB: 01-23-1946, PCP Elfredia Nevins, MD   Past Medical History:  Diagnosis Date  . Arthritis   . Diabetes mellitus    since 2008  . Essential hypertension, benign 09/10/2015  . GERD (gastroesophageal reflux disease)   . Hypercholesteremia   . Hyperlipidemia   . Stroke (HCC) 03/2011   only deficits-affected eyesight   Past Surgical History:  Procedure Laterality Date  . CATARACT EXTRACTION W/PHACO  11/01/2011   Procedure: CATARACT EXTRACTION PHACO AND INTRAOCULAR LENS PLACEMENT (IOC);  Surgeon: Gemma Payor, MD;  Location: [facility];  Service: Ophthalmology;  Laterality: Right;  CDE 12.48  . ESOPHAGEAL DILATION N/A 04/29/2017   Procedure: ESOPHAGEAL DILATION;  Surgeon: Malissa Hippo, MD;  Location: [facility];  Service: Endoscopy;  Laterality: N/A;  . ESOPHAGOGASTRODUODENOSCOPY N/A 04/29/2017   Procedure: ESOPHAGOGASTRODUODENOSCOPY (EGD);  Surgeon: Malissa Hippo, MD;  Location: [facility];  Service: Endoscopy;  Laterality: N/A;  1015  . ESOPHAGOGASTRODUODENOSCOPY (EGD) WITH ESOPHAGEAL DILATION N/A 02/14/2013   Procedure: ESOPHAGOGASTRODUODENOSCOPY (EGD) WITH ESOPHAGEAL DILATION;  Surgeon: Malissa Hippo, MD;  Location: [facility];  Service: Endoscopy;  Laterality: N/A;  200-moved to 255 Ann to notify pt  . EYE SURGERY  10-2010   left eye removal of cataract  . TUBAL LIGATION  1984   Social History   Socioeconomic History  . Marital status: Married    Spouse name: wayne  . Number of children: 2  . Years of education: 67  . Highest education level: Not on file  Occupational History  . 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 Financial resource strain: Not on file  . Food insecurity:    Worry: Not on file    Inability: Not on file  . Transportation needs:    Medical: Not on file    Non-medical: Not on file  Tobacco Use  . Smoking status: Never Smoker  .  Smokeless tobacco: Never Used  Substance and Sexual Activity  . Alcohol use: No    Alcohol/week: 0.0 oz  . Drug use: No  . Sexual activity: Yes    Birth control/protection: None  Lifestyle  . Physical activity:    Days per week: Not on file    Minutes per session: Not on file  . Stress: Not on file  Relationships  . Social connections:    Talks on phone: Not on file    Gets together: Not on file    Attends religious service: Not on file    Active member of club or organization: Not on file    Attends meetings of clubs or organizations: Not on file    Relationship status: Not on file  Other Topics Concern  . Not on file  Social History Narrative   Patient is married with 2 children.   Patient is right handed.   Patient has hs education.   Patient drinks 3-5 glasses of tea daily.   Outpatient Encounter Medications as of 01/19/2018  Medication Sig  . calcium carbonate (TUMS - DOSED IN MG ELEMENTAL CALCIUM) 500 MG chewable tablet Chew 1 tablet by mouth at bedtime as needed for indigestion or heartburn.  . clopidogrel (PLAVIX) 75 MG tablet Take 1 tablet (75 mg total) by mouth daily.  . Continuous Blood Gluc Sensor (FREESTYLE LIBRE SENSOR SYSTEM) MISC Use one sensor every 10 days.  Marland Kitchen dexlansoprazole (DEXILANT) 60  MG capsule Take 1 capsule (60 mg total) by mouth daily before breakfast.  . GLOBAL EASE INJECT PEN NEEDLES 32G X 4 MM MISC USE 4 PENTIPS EVERY DAY  . GLOBAL INJECT EASE LANCETS 30G MISC DIABETIC CLUB  . glucose blood (TRUE METRIX BLOOD GLUCOSE TEST) test strip Use as instructed 4 x daily  . insulin aspart (NOVOLOG FLEXPEN) 100 UNIT/ML FlexPen Inject 12-18 Units into the skin 3 (three) times daily with meals. Do not inject if premeal glucose is below 70  . Insulin Detemir (LEVEMIR FLEXTOUCH) 100 UNIT/ML Pen INJECT up to 50 UNITS INTO THE SKIN AT BEDTIME.  Marland Kitchen l-methylfolate-B6-B12 (METANX) 3-35-2 MG TABS Take 1 tablet by mouth 2 (two) times daily.  Marland Kitchen latanoprost (XALATAN) 0.005  % ophthalmic solution Place 1 drop into the right eye at bedtime.  Marland Kitchen lisinopril (PRINIVIL,ZESTRIL) 10 MG tablet   . Menthol-Methyl Salicylate (MUSCLE RUB EX) Apply 1 application topically daily as needed (arthritis). [place] Gel  . Multiple Vitamin (ONE-A-DAY 55 PLUS PO) Take 1 tablet by mouth daily.   . naproxen sodium (ANAPROX) 220 MG tablet Take 220 mg by mouth daily as needed (pain).  . pantoprazole (PROTONIX) 40 MG tablet Take 1 tablet (40 mg total) by mouth 2 (two) times daily.  . ranitidine (ZANTAC 75) 75 MG tablet Take 2 tablets (150 mg total) by mouth at bedtime.  . simvastatin (ZOCOR) 40 MG tablet Take 40 mg by mouth every evening.  . vitamin C (ASCORBIC ACID) 500 MG tablet Take 500 mg by mouth daily.  . vitamin E (VITAMIN E) 400 UNIT capsule Take 400 Units by mouth daily.  . [DISCONTINUED] losartan (COZAAR) 25 MG tablet Take 25 mg by mouth daily. Reported on 10/15/2015   No facility-administered encounter medications on file as of 01/19/2018.    ALLERGIES: Allergies  Allergen Reactions  . Ampicillin Hives    Breaks out in bumps Has patient had a PCN reaction causing immediate rash, facial/tongue/throat swelling, SOB or lightheadedness with hypotension: No Has patient had a PCN reaction causing severe rash involving mucus membranes or skin necrosis: Yes Has patient had a PCN reaction that required hospitalization: No Has patient had a PCN reaction occurring within the last 10 years: No If all of the above answers are "NO", then may proceed with Cephalosporin use.   Therese Sarah [Bromfenac Sodium] Itching and Swelling  . Tetanus Toxoids Other (See Comments)    Breaks out in bumps   VACCINATION STATUS: Immunization History  Administered Date(s) Administered  . Rabies, IM 02/13/2014, 02/16/2014, 02/20/2014, 02/27/2014    Diabetes  She presents for her follow-up diabetic visit. She has type 1 diabetes mellitus. Onset time: She was diagnosed at approximate age of 56 years. Her  disease course has been worsening. There are no hypoglycemic associated symptoms. Pertinent negatives for hypoglycemia include no confusion, headaches, pallor or seizures. Associated symptoms include blurred vision. Pertinent negatives for diabetes include no chest pain, no polydipsia, no polyphagia and no polyuria. There are no hypoglycemic complications. Symptoms are worsening. Diabetic complications include a CVA. Current diabetic treatment includes intensive insulin program. She is compliant with treatment some of the time. Her weight is stable. She is following a generally unhealthy diet. When asked about meal planning, she reported none. She has had a previous visit with a dietitian. She never participates in exercise. Her home blood glucose trend is increasing steadily. Her breakfast blood glucose range is generally >200 mg/dl. Her lunch blood glucose range is generally 180-200 mg/dl. Her dinner blood  glucose range is generally 180-200 mg/dl. Her bedtime blood glucose range is generally 180-200 mg/dl. Her overall blood glucose range is 180-200 mg/dl. An ACE inhibitor/angiotensin II receptor blocker is being taken. Eye exam is current (She does have a visual deficit from her prior CVA.).  Hyperlipidemia  This is a chronic problem. The current episode started more than 1 year ago. Exacerbating diseases include diabetes. Pertinent negatives include no chest pain, myalgias or shortness of breath. Current antihyperlipidemic treatment includes statins. Risk factors for coronary artery disease include diabetes mellitus, dyslipidemia, hypertension and a sedentary lifestyle.  Hypertension  This is a chronic problem. The current episode started more than 1 year ago. Associated symptoms include blurred vision. Pertinent negatives include no chest pain, headaches, palpitations or shortness of breath. Risk factors for coronary artery disease include dyslipidemia, diabetes mellitus, sedentary lifestyle and  post-menopausal state. Past treatments include ACE inhibitors. Hypertensive end-organ damage includes CVA.    Review of Systems  Constitutional: Negative for chills, fever and unexpected weight change.  HENT: Negative for trouble swallowing and voice change.   Eyes: Positive for blurred vision. Negative for visual disturbance.  Respiratory: Negative for cough, shortness of breath and wheezing.   Cardiovascular: Negative for chest pain, palpitations and leg swelling.  Gastrointestinal: Negative for diarrhea, nausea and vomiting.  Endocrine: Negative for cold intolerance, heat intolerance, polydipsia, polyphagia and polyuria.  Musculoskeletal: Negative for arthralgias and myalgias.  Skin: Negative for color change, pallor, rash and wound.  Neurological: Negative for seizures and headaches.  Psychiatric/Behavioral: Negative for confusion and suicidal ideas.    Objective:    BP 136/71   Pulse 74   Ht  (1.499 m)   Wt 137 lb (62.1 kg)   BMI 27.67 kg/m   Wt Readings from Last 3 Encounters:  01/19/18 137 lb (62.1 kg)  09/02/17 137 lb (62.1 kg)  08/09/17 136 lb 8 oz (61.9 kg)    Physical Exam  Constitutional: She is oriented to person, place, and time. She appears well-developed.  HENT:  Head: Normocephalic and atraumatic.  Eyes: EOM are normal.  Neck: Normal range of motion. Neck supple. No tracheal deviation present. No thyromegaly present.  Cardiovascular: Normal rate.  Pulmonary/Chest: Effort normal.  Abdominal: Bowel sounds are normal. There is no tenderness. There is no guarding.  Musculoskeletal: Normal range of motion. She exhibits no edema.  Neurological: She is alert and oriented to person, place, and time. She has normal reflexes. No cranial nerve deficit. Coordination normal.  Skin: Skin is warm and dry. No rash noted. No erythema. No pallor.  Psychiatric: She has a normal mood and affect. Judgment normal.     Lab Results  Component Value Date   WBC 10.9 (H)  02/13/2014   HGB 11.3 (L) 02/13/2014   HCT 33.4 (L) 02/13/2014   MCV 88.8 02/13/2014   PLT 225 02/13/2014   Chemistry (most recent): Lab Results  Component Value Date   NA 139 01/12/2018   K 4.2 01/12/2018   CL 105 01/12/2018   CO2 26 01/12/2018   BUN 30 (H) 01/12/2018   CREATININE 0.96 (H) 01/12/2018   Diabetic Labs (most recent): Lab Results  Component Value Date   HGBA1C 8.4 (H) 01/12/2018   HGBA1C 7.9 (H) 08/26/2017   HGBA1C 7.4 (H) 05/11/2017   Lipid Panel     Component Value Date/Time   CHOL 260 (H) 10/11/2016 1135   TRIG 98 10/11/2016 1135   HDL 96 10/11/2016 1135   CHOLHDL 2.7 10/11/2016 1135  VLDL 20 10/11/2016 1135   LDLCALC 144 (H) 10/11/2016 1135     Assessment & Plan:   1. 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 These are all discussed in detail with the patient.  Patient came with significantly above target blood glucose readings both fasting and postprandial.  Her recent labs show A1c of 8.4% increasing from 7.9%.       - she is still unable to avoid simple carbohydrates like ice cream, saying that " it doesn't affect her glucose readings".  Glucose logs and insulin administration records pertaining to this visit,  to be scanned into patient's records.  Recent labs reviewed.  - I have re-counseled the patient on diet management   by adopting a carbohydrate restricted / protein rich  Diet.  -  Suggestion is made for her to avoid simple carbohydrates  from her diet including Cakes, Sweet Desserts / Pastries, Ice Cream, Soda (diet and regular), Sweet Tea, Candies, Chips, Cookies, Store Bought Juices, Alcohol in Excess of  1-2 drinks a day, Artificial Sweeteners, and "Sugar-free" Products. This will help patient to have stable blood glucose  profile and potentially avoid unintended weight gain.   - Patient is advised to stick to a routine mealtimes to eat 3 meals  a day and avoid unnecessary snacks ( to snack only to correct hypoglycemia).  - I have approached patient with the following individualized plan to manage diabetes and patient agrees.  - She has visual limitation and inconsistent meal pattern. She decided to not use her continuous glucose monitoring citing some itching on her skin around the sensor. - She is not a suitable candidate for insulin pump, however she would have benefited from continued glucose monitoring.  -I gave her samples of Tresiba to use 40 units nightly (while holding Levemir for the next 2 weeks), continue NovoLog 12 units  3 times a day before meals plus correction associated with strict monitoring of blood glucose 4 times a day-before meals and at bedtime.  -If she documents better blood glucose readings, she will be switched to [place] from Levemir.  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 -Patient is not a candidate for metformin,SGLT2 inhibitors, and incretin therapy. -Proper use of insulin is exclusive choice she has to treat diabetes to target.  - Patient specific target  for A1c; LDL, HDL, Triglycerides, and  Waist Circumference were discussed in detail.  2) BP/HTN: Her blood pressure is controlled to target.  She is advised to continue her blood pressure medications including lisinopril 10 mg p.o. daily.    3) Lipids/HPL:   she has not been consistent taking her atorvastatin, her LDL is still high at 144.  I have advised her to be more consistent and to continue atorvastatin 40 mg by mouth daily at bedtime.  4)  Weight/Diet: CDE consult in progress, exercise, and carbohydrates information provided.  5) Chronic Care/Health Maintenance:  -Patient is on ACEI/ARB  and Statin medications and encouraged to continue to follow up with Ophthalmology, Podiatrist at least yearly or according to recommendations, and advised to  stay away from smoking. I have recommended yearly flu vaccine and pneumonia vaccination at least every 5 years; moderate intensity exercise  for up to 150 minutes weekly; and  sleep for at least 7 hours a day.  - I advised patient to maintain close follow up with Elfredia Nevins, MD for primary care needs. - Time spent with the patient: 25 min, of which >50% was spent in reviewing her blood glucose logs , discussing her hypo- and hyper-glycemic episodes, reviewing her current and  previous labs and insulin doses and developing a plan to avoid hypo- and hyper-glycemia. Please refer to Patient Instructions for Blood Glucose Monitoring and Insulin/Medications Dosing Guide"  in media tab for additional information. Nancy Sutton participated in the discussions, expressed understanding, and voiced agreement with the above plans.  All questions were answered to her satisfaction. she is encouraged to contact clinic should she have any questions or concerns prior to her return visit.  Follow up plan: -Return in about 2 weeks (around 02/02/2018) for follow up with meter and logs- no labs.  Marquis Lunch, MD Phone: [phone]  Fax: [phone]   This note was partially dictated with voice recognition software. Similar sounding words can be transcribed inadequately or may not  be corrected upon review.  01/19/2018, 11:33 AM

## 2018-01-19 NOTE — Patient Instructions (Signed)

## 2018-01-26 DIAGNOSIS — H401132 Primary open-angle glaucoma, bilateral, moderate stage: Secondary | ICD-10-CM | POA: Diagnosis not present

## 2018-01-30 DIAGNOSIS — N182 Chronic kidney disease, stage 2 (mild): Secondary | ICD-10-CM | POA: Diagnosis not present

## 2018-01-30 DIAGNOSIS — Z8673 Personal history of transient ischemic attack (TIA), and cerebral infarction without residual deficits: Secondary | ICD-10-CM | POA: Diagnosis not present

## 2018-01-30 DIAGNOSIS — E1129 Type 2 diabetes mellitus with other diabetic kidney complication: Secondary | ICD-10-CM | POA: Diagnosis not present

## 2018-01-30 DIAGNOSIS — E782 Mixed hyperlipidemia: Secondary | ICD-10-CM | POA: Diagnosis not present

## 2018-02-02 ENCOUNTER — Ambulatory Visit (INDEPENDENT_AMBULATORY_CARE_PROVIDER_SITE_OTHER): Payer: Medicare Other | Admitting: "Endocrinology

## 2018-02-02 ENCOUNTER — Encounter: Payer: Self-pay | Admitting: "Endocrinology

## 2018-02-02 VITALS — BP 134/75 | HR 80 | Ht 59.0 in | Wt 139.0 lb

## 2018-02-02 DIAGNOSIS — I10 Essential (primary) hypertension: Secondary | ICD-10-CM | POA: Diagnosis not present

## 2018-02-02 DIAGNOSIS — E1129 Type 2 diabetes mellitus with other diabetic kidney complication: Secondary | ICD-10-CM | POA: Diagnosis not present

## 2018-02-02 DIAGNOSIS — I6529 Occlusion and stenosis of unspecified carotid artery: Secondary | ICD-10-CM | POA: Diagnosis not present

## 2018-02-02 DIAGNOSIS — E1065 Type 1 diabetes mellitus with hyperglycemia: Secondary | ICD-10-CM | POA: Diagnosis not present

## 2018-02-02 DIAGNOSIS — E782 Mixed hyperlipidemia: Secondary | ICD-10-CM | POA: Diagnosis not present

## 2018-02-02 NOTE — Patient Instructions (Signed)

## 2018-02-02 NOTE — Progress Notes (Signed)
Subjective:    Patient ID: Nancy Sutton, female    DOB: 1945/12/20, PCP Elfredia Nevins, MD   Past Medical History:  Diagnosis Date  . Arthritis   . Diabetes mellitus    since 2008  . Essential hypertension, benign 09/10/2015  . GERD (gastroesophageal reflux disease)   . Hypercholesteremia   . Hyperlipidemia   . Stroke (HCC) 03/2011   only deficits-affected eyesight   Past Surgical History:  Procedure Laterality Date  . CATARACT EXTRACTION W/PHACO  11/01/2011   Procedure: CATARACT EXTRACTION PHACO AND INTRAOCULAR LENS PLACEMENT (IOC);  Surgeon: Gemma Payor, MD;  Location: [facility];  Service: Ophthalmology;  Laterality: Right;  CDE 12.48  . ESOPHAGEAL DILATION N/A 04/29/2017   Procedure: ESOPHAGEAL DILATION;  Surgeon: Malissa Hippo, MD;  Location: [facility];  Service: Endoscopy;  Laterality: N/A;  . ESOPHAGOGASTRODUODENOSCOPY N/A 04/29/2017   Procedure: ESOPHAGOGASTRODUODENOSCOPY (EGD);  Surgeon: Malissa Hippo, MD;  Location: [facility];  Service: Endoscopy;  Laterality: N/A;  1015  . ESOPHAGOGASTRODUODENOSCOPY (EGD) WITH ESOPHAGEAL DILATION N/A 02/14/2013   Procedure: ESOPHAGOGASTRODUODENOSCOPY (EGD) WITH ESOPHAGEAL DILATION;  Surgeon: Malissa Hippo, MD;  Location: [facility];  Service: Endoscopy;  Laterality: N/A;  200-moved to 255 Ann to notify pt  . EYE SURGERY  10-2010   left eye removal of cataract  . TUBAL LIGATION  1984   Social History   Socioeconomic History  . Marital status: Married    Spouse name: wayne  . Number of children: 2  . Years of education: 38  . Highest education level: Not on file  Occupational History  . 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 Financial resource strain: Not on file  . Food insecurity:    Worry: Not on file    Inability: Not on file  . Transportation needs:    Medical: Not on file    Non-medical: Not on file  Tobacco Use  . Smoking status: Never Smoker  .  Smokeless tobacco: Never Used  Substance and Sexual Activity  . Alcohol use: No    Alcohol/week: 0.0 oz  . Drug use: No  . Sexual activity: Yes    Birth control/protection: None  Lifestyle  . Physical activity:    Days per week: Not on file    Minutes per session: Not on file  . Stress: Not on file  Relationships  . Social connections:    Talks on phone: Not on file    Gets together: Not on file    Attends religious service: Not on file    Active member of club or organization: Not on file    Attends meetings of clubs or organizations: Not on file    Relationship status: Not on file  Other Topics Concern  . Not on file  Social History Narrative   Patient is married with 2 children.   Patient is right handed.   Patient has hs education.   Patient drinks 3-5 glasses of tea daily.   Outpatient Encounter Medications as of 02/02/2018  Medication Sig  . insulin degludec (TRESIBA FLEXTOUCH) 100 UNIT/ML SOPN FlexTouch Pen Inject 40 Units into the skin at bedtime.  . calcium carbonate (TUMS - DOSED IN MG ELEMENTAL CALCIUM) 500 MG chewable tablet Chew 1 tablet by mouth at bedtime as needed for indigestion or heartburn.  . clopidogrel (PLAVIX) 75 MG tablet Take 1 tablet (75 mg total) by mouth daily.  . Continuous  Blood Gluc Sensor (FREESTYLE LIBRE SENSOR SYSTEM) MISC Use one sensor every 10 days.  Marland Kitchen dexlansoprazole (DEXILANT) 60 MG capsule Take 1 capsule (60 mg total) by mouth daily before breakfast.  . GLOBAL EASE INJECT PEN NEEDLES 32G X 4 MM MISC USE 4 PENTIPS EVERY DAY  . GLOBAL INJECT EASE LANCETS 30G MISC DIABETIC CLUB  . glucose blood (TRUE METRIX BLOOD GLUCOSE TEST) test strip Use as instructed 4 x daily  . insulin aspart (NOVOLOG FLEXPEN) 100 UNIT/ML FlexPen Inject 12-18 Units into the skin 3 (three) times daily with meals. Do not inject if premeal glucose is below 70  . l-methylfolate-B6-B12 (METANX) 3-35-2 MG TABS Take 1 tablet by mouth 2 (two) times daily.  Marland Kitchen latanoprost  (XALATAN) 0.005 % ophthalmic solution Place 1 drop into the right eye at bedtime.  Marland Kitchen lisinopril (PRINIVIL,ZESTRIL) 10 MG tablet   . Menthol-Methyl Salicylate (MUSCLE RUB EX) Apply 1 application topically daily as needed (arthritis). [place] Gel  . Multiple Vitamin (ONE-A-DAY 55 PLUS PO) Take 1 tablet by mouth daily.   . naproxen sodium (ANAPROX) 220 MG tablet Take 220 mg by mouth daily as needed (pain).  . pantoprazole (PROTONIX) 40 MG tablet Take 1 tablet (40 mg total) by mouth 2 (two) times daily.  . ranitidine (ZANTAC 75) 75 MG tablet Take 2 tablets (150 mg total) by mouth at bedtime.  . simvastatin (ZOCOR) 40 MG tablet Take 40 mg by mouth every evening.  . vitamin C (ASCORBIC ACID) 500 MG tablet Take 500 mg by mouth daily.  . vitamin E (VITAMIN E) 400 UNIT capsule Take 400 Units by mouth daily.  . [DISCONTINUED] Insulin Detemir (LEVEMIR FLEXTOUCH) 100 UNIT/ML Pen INJECT up to 50 UNITS INTO THE SKIN AT BEDTIME. (Patient taking differently: 40 Units at bedtime. INJECT up to 50 UNITS INTO THE SKIN AT BEDTIME.)   No facility-administered encounter medications on file as of 02/02/2018.    ALLERGIES: Allergies  Allergen Reactions  . Ampicillin Hives    Breaks out in bumps Has patient had a PCN reaction causing immediate rash, facial/tongue/throat swelling, SOB or lightheadedness with hypotension: No Has patient had a PCN reaction causing severe rash involving mucus membranes or skin necrosis: Yes Has patient had a PCN reaction that required hospitalization: No Has patient had a PCN reaction occurring within the last 10 years: No If all of the above answers are "NO", then may proceed with Cephalosporin use.   Therese Sarah [Bromfenac Sodium] Itching and Swelling  . Tetanus Toxoids Other (See Comments)    Breaks out in bumps   VACCINATION STATUS: Immunization History  Administered Date(s) Administered  . Rabies, IM 02/13/2014, 02/16/2014, 02/20/2014, 02/27/2014    Diabetes  She presents for  her follow-up diabetic visit. She has type 1 diabetes mellitus. Onset time: She was diagnosed at approximate age of 25 years. Her disease course has been improving. There are no hypoglycemic associated symptoms. Pertinent negatives for hypoglycemia include no confusion, headaches, pallor or seizures. Associated symptoms include blurred vision. Pertinent negatives for diabetes include no chest pain, no polydipsia, no polyphagia and no polyuria. There are no hypoglycemic complications. Symptoms are improving. Diabetic complications include a CVA. Current diabetic treatment includes intensive insulin program. She is compliant with treatment some of the time. Her weight is increasing steadily. She is following a generally unhealthy diet. When asked about meal planning, she reported none. She has had a previous visit with a dietitian. She never participates in exercise. Her home blood glucose trend is increasing steadily.  Her breakfast blood glucose range is generally 140-180 mg/dl. Her lunch blood glucose range is generally 140-180 mg/dl. Her dinner blood glucose range is generally 140-180 mg/dl. Her bedtime blood glucose range is generally 140-180 mg/dl. Her overall blood glucose range is 140-180 mg/dl. An ACE inhibitor/angiotensin II receptor blocker is being taken. Eye exam is current (She does have a visual deficit from her prior CVA.).  Hyperlipidemia  This is a chronic problem. The current episode started more than 1 year ago. Exacerbating diseases include diabetes. Pertinent negatives include no chest pain, myalgias or shortness of breath. Current antihyperlipidemic treatment includes statins. Risk factors for coronary artery disease include diabetes mellitus, dyslipidemia, hypertension and a sedentary lifestyle.  Hypertension  This is a chronic problem. The current episode started more than 1 year ago. Associated symptoms include blurred vision. Pertinent negatives include no chest pain, headaches,  palpitations or shortness of breath. Risk factors for coronary artery disease include dyslipidemia, diabetes mellitus, sedentary lifestyle and post-menopausal state. Past treatments include ACE inhibitors. Hypertensive end-organ damage includes CVA.    Review of Systems  Constitutional: Negative for chills, fever and unexpected weight change.  HENT: Negative for trouble swallowing and voice change.   Eyes: Positive for blurred vision. Negative for visual disturbance.  Respiratory: Negative for cough, shortness of breath and wheezing.   Cardiovascular: Negative for chest pain, palpitations and leg swelling.  Gastrointestinal: Negative for diarrhea, nausea and vomiting.  Endocrine: Negative for cold intolerance, heat intolerance, polydipsia, polyphagia and polyuria.  Musculoskeletal: Negative for arthralgias and myalgias.  Skin: Negative for color change, pallor, rash and wound.  Neurological: Negative for seizures and headaches.  Psychiatric/Behavioral: Negative for confusion and suicidal ideas.    Objective:    BP 134/75   Pulse 80   Ht  (1.499 m)   Wt 139 lb (63 kg)   BMI 28.07 kg/m   Wt Readings from Last 3 Encounters:  02/02/18 139 lb (63 kg)  01/19/18 137 lb (62.1 kg)  09/02/17 137 lb (62.1 kg)    Physical Exam  Constitutional: She is oriented to person, place, and time. She appears well-developed.  HENT:  Head: Normocephalic and atraumatic.  Eyes: EOM are normal.  Neck: Normal range of motion. Neck supple. No tracheal deviation present. No thyromegaly present.  Cardiovascular: Normal rate.  Pulmonary/Chest: Effort normal.  Abdominal: Bowel sounds are normal. There is no tenderness. There is no guarding.  Musculoskeletal: Normal range of motion. She exhibits no edema.  Neurological: She is alert and oriented to person, place, and time. She has normal reflexes. No cranial nerve deficit. Coordination normal.  Skin: Skin is warm and dry. No rash noted. No erythema. No  pallor.  Psychiatric: She has a normal mood and affect. Judgment normal.     Lab Results  Component Value Date   WBC 10.9 (H) 02/13/2014   HGB 11.3 (L) 02/13/2014   HCT 33.4 (L) 02/13/2014   MCV 88.8 02/13/2014   PLT 225 02/13/2014   Chemistry (most recent): Lab Results  Component Value Date   NA 139 01/12/2018   K 4.2 01/12/2018   CL 105 01/12/2018   CO2 26 01/12/2018   BUN 30 (H) 01/12/2018   CREATININE 0.96 (H) 01/12/2018   Diabetic Labs (most recent): Lab Results  Component Value Date   HGBA1C 8.4 (H) 01/12/2018   HGBA1C 7.9 (H) 08/26/2017   HGBA1C 7.4 (H) 05/11/2017   Lipid Panel     Component Value Date/Time   CHOL 260 (H) 10/11/2016 1135  TRIG 98 10/11/2016 1135   HDL 96 10/11/2016 1135   CHOLHDL 2.7 10/11/2016 1135   VLDL 20 10/11/2016 1135   LDLCALC 144 (H) 10/11/2016 1135     Assessment & Plan:   1. 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 These are all discussed in detail with the patient.  Patient came with significantly improved blood glucose profile on Tresiba versus Levemir   -Her recent labs showed higher A1c of 8.4% increasing from 7.9%.    - she is still unable to avoid simple carbohydrates like ice cream, saying that " it doesn't affect her glucose readings".  Glucose logs and insulin administration records pertaining to this visit,  to be scanned into patient's records.  Recent labs reviewed.  - I have re-counseled the patient on diet management   by adopting a carbohydrate restricted / protein rich  Diet.  -  Suggestion is made for her to avoid simple carbohydrates  from her diet including Cakes, Sweet Desserts / Pastries, Ice Cream, Soda (diet and regular), Sweet Tea, Candies, Chips, Cookies, Store Bought Juices, Alcohol in  Excess of  1-2 drinks a day, Artificial Sweeteners, and "Sugar-free" Products. This will help patient to have stable blood glucose profile and potentially avoid unintended weight gain.  - Patient is advised to stick to a routine mealtimes to eat 3 meals  a day and avoid unnecessary snacks ( to snack only to correct hypoglycemia).  - I have approached patient with the following individualized plan to manage diabetes and patient agrees.  - She has visual limitation and inconsistent meal pattern. - She is not a suitable candidate for insulin pump, however she would have benefited from continued glucose monitoring. She decided to not use her continuous glucose monitoring citing some itching on her skin around the sensor.  -She reports concerned that Evaristo Bury will cost her more when she goes into the donut hole. -I advised her to continue to finish her existing Levemir supplies and gave her some more samples of Tresiba to use 40 units nightly, continue  NovoLog 12 units  3 times a day before meals plus correction associated with strict monitoring of blood glucose 4 times a day-before meals and at bedtime.    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 -Patient is not a candidate for metformin,SGLT2 inhibitors, and incretin therapy. -Proper use of insulin is exclusive choice she has to treat diabetes to target.  - Patient specific target  for A1c; LDL, HDL, Triglycerides, and  Waist Circumference were discussed in detail.  2) BP/HTN: Her blood pressure is controlled to target.  She is advised to continue her current blood pressure medications including lisinopril 10 mg p.o. daily.   3) Lipids/HPL:   she has not been consistent taking her atorvastatin, her LDL is still high at 144.  I have advised her to be more consistent and to continue atorvastatin 40 mg by mouth daily at  bedtime.  4)  Weight/Diet: CDE consult in progress, exercise, and carbohydrates information provided.  5) Chronic Care/Health Maintenance:  -Patient is on ACEI/ARB and Statin medications and encouraged to continue to follow up with Ophthalmology, Podiatrist at least yearly or according to recommendations, and advised  to  stay away from smoking. I have recommended yearly flu vaccine and pneumonia vaccination at least every 5 years; moderate intensity exercise for up to 150 minutes weekly; and  sleep for at least 7 hours a day.  - I advised patient to maintain close follow up with Elfredia Nevins, MD for primary care needs.  - Time spent with the patient: 25 min, of which >50% was spent in reviewing her blood glucose logs , discussing her hypo- and hyper-glycemic episodes, reviewing her current and  previous labs and insulin doses and developing a plan to avoid hypo- and hyper-glycemia. Please refer to Patient Instructions for Blood Glucose Monitoring and Insulin/Medications Dosing Guide"  in media tab for additional information. Nancy Sutton participated in the discussions, expressed understanding, and voiced agreement with the above plans.  All questions were answered to her satisfaction. she is encouraged to contact clinic should she have any questions or concerns prior to her return visit.  Follow up plan: -Return in about 3 months (around 05/05/2018) for follow up with pre-visit labs, meter, and logs.  Marquis Lunch, MD Phone: [phone]  Fax: [phone]   This note was partially dictated with voice recognition software. Similar sounding words can be transcribed inadequately or may not  be corrected upon review.  02/02/2018, 11:33 AM

## 2018-02-28 ENCOUNTER — Encounter (INDEPENDENT_AMBULATORY_CARE_PROVIDER_SITE_OTHER): Payer: Self-pay | Admitting: Internal Medicine

## 2018-02-28 ENCOUNTER — Ambulatory Visit (INDEPENDENT_AMBULATORY_CARE_PROVIDER_SITE_OTHER): Payer: Medicare Other | Admitting: Internal Medicine

## 2018-02-28 VITALS — BP 120/82 | HR 74 | Temp 97.6°F | Resp 18 | Ht 59.0 in | Wt 139.9 lb

## 2018-02-28 DIAGNOSIS — I6529 Occlusion and stenosis of unspecified carotid artery: Secondary | ICD-10-CM | POA: Diagnosis not present

## 2018-02-28 DIAGNOSIS — K2100 Gastro-esophageal reflux disease with esophagitis, without bleeding: Secondary | ICD-10-CM

## 2018-02-28 DIAGNOSIS — K2270 Barrett's esophagus without dysplasia: Secondary | ICD-10-CM

## 2018-02-28 MED ORDER — PANTOPRAZOLE SODIUM 40 MG PO TBEC: 40.0000 mg | DELAYED_RELEASE_TABLET | Freq: Two times a day (BID) | ORAL | 3 refills | Status: DC

## 2018-02-28 NOTE — Patient Instructions (Addendum)
Call if pantoprazole stops working or you have swallowing difficulty

## 2018-02-28 NOTE — Progress Notes (Signed)
Presenting complaint;  Follow-up for complicated GERD.  Database and subjective:  Patient is 72 year old Caucasian female who has chronic GERD complicated by esophageal stricture and short segment Barrett's esophagus.  On her last EGD of August 2018 she also had erosive esophagitis. She was last seen in the office on 08/09/2017.  She is accompanied by her husband today.  She states she does well as long as she takes pantoprazole twice daily.  She takes first dose before breakfast and second dose before evening meal.  She tries to take clopidogrel at night.  She tried taking 1 pantoprazole in the morning and Zantac once or twice a day but she stayed up all night with heartburn and regurgitation.  She is watching her diet.  She denies dysphagia nausea or vomiting.  She uses Tums every now and then for heartburn at night.  She eats her supper at least 3 to 4 hours before she goes to bed.  Bowels move daily.  She denies melena or rectal bleeding.  She takes Naprosyn no more than 2-4 times in a month.  She exercising and walks almost daily.  She states her neurologist Dr. Pearlean Brownie advised that she should not take omeprazole because of interaction with pantoprazole..  She says it did not work well when she did try it.   Patient is requesting that either letter to her insurance about the need for double dose PPI.    Current Medications: Outpatient Encounter Medications as of 02/28/2018  Medication Sig  . brimonidine (ALPHAGAN) 0.2 % ophthalmic solution Place 1 drop into the right eye 3 (three) times daily.  . calcium carbonate (TUMS - DOSED IN MG ELEMENTAL CALCIUM) 500 MG chewable tablet Chew 1 tablet by mouth at bedtime as needed for indigestion or heartburn.  . clopidogrel (PLAVIX) 75 MG tablet Take 1 tablet (75 mg total) by mouth daily.  . Continuous Blood Gluc Sensor (FREESTYLE LIBRE SENSOR SYSTEM) MISC Use one sensor every 10 days.  . dorzolamide-timolol (COSOPT) 22.3-6.8 MG/ML ophthalmic solution  Place 1 drop into both eyes 2 (two) times daily.  Marland Kitchen GLOBAL EASE INJECT PEN NEEDLES 32G X 4 MM MISC USE 4 PENTIPS EVERY DAY  . glucose blood (TRUE METRIX BLOOD GLUCOSE TEST) test strip Use as instructed 4 x daily  . insulin degludec (TRESIBA FLEXTOUCH) 100 UNIT/ML SOPN FlexTouch Pen Inject 40 Units into the skin at bedtime.  Marland Kitchen l-methylfolate-B6-B12 (METANX) 3-35-2 MG TABS Take 1 tablet by mouth 2 (two) times daily.  Marland Kitchen latanoprost (XALATAN) 0.005 % ophthalmic solution Place 1 drop into the right eye at bedtime.  Marland Kitchen lisinopril (PRINIVIL,ZESTRIL) 10 MG tablet Take 10 mg by mouth daily.   . Menthol-Methyl Salicylate (MUSCLE RUB EX) Apply 1 application topically daily as needed (arthritis). [place] Gel  . Multiple Vitamin (ONE-A-DAY 55 PLUS PO) Take 1 tablet by mouth daily.   . naproxen sodium (ANAPROX) 220 MG tablet Take 220 mg by mouth daily as needed (pain).  . pantoprazole (PROTONIX) 40 MG tablet Take 1 tablet (40 mg total) by mouth 2 (two) times daily.  . ranitidine (ZANTAC 75) 75 MG tablet Take 2 tablets (150 mg total) by mouth at bedtime.  . simvastatin (ZOCOR) 80 MG tablet Take 80 mg by mouth every evening.   . vitamin C (ASCORBIC ACID) 500 MG tablet Take 500 mg by mouth daily.  . vitamin E (VITAMIN E) 400 UNIT capsule Take 400 Units by mouth daily.  Marland Kitchen GLOBAL INJECT EASE LANCETS 30G MISC DIABETIC CLUB  . insulin aspart (  NOVOLOG FLEXPEN) 100 UNIT/ML FlexPen Inject 12-18 Units into the skin 3 (three) times daily with meals. Do not inject if premeal glucose is below 70  . [DISCONTINUED] dexlansoprazole (DEXILANT) 60 MG capsule Take 1 capsule (60 mg total) by mouth daily before breakfast. (Patient not taking: Reported on 02/28/2018)   No facility-administered encounter medications on file as of 02/28/2018.      Objective: Blood pressure 120/82, pulse 74, temperature 97.6 F (36.4 C), temperature source Oral, resp. rate 18, height 4\' 11"  (1.499 m), weight 139 lb 14.4 oz (63.5 kg). Patient is alert  and in no acute distress. Conjunctiva is pink. Sclera is nonicteric Oropharyngeal mucosa is normal. No neck masses or thyromegaly noted. Cardiac exam with regular rhythm normal S1 and S2. No murmur or gallop noted. Lungs are clear to auscultation. Abdomen is symmetrical soft and nontender without organomegaly or masses. No LE edema or clubbing noted.   Assessment:  #1.  Patient with chronic GERD complicated by esophageal stricture which was last manipulated in August 2018 as well as short segment Barrett's esophagus who is requiring double dose PPI.  She is watching her diet.  She did not do well with PPIs dose reduction in addition of H2B.  There is concern for interaction with Clopidogrel but her co-pay with dexlansoprazole is too high and she is not able to afford it. Patient is clearly demonstrated that she is not a candidate for H2B therapy alone.   Plan:  List updated.  Zantac deleted from the list. New prescription for pantoprazole 40 mg p.o. twice daily patient's pharmacy for 3 months with 3 refills. Will write a letter to her insurance as to the need for double dose PPI. Office visit in 6 months.

## 2018-03-06 ENCOUNTER — Other Ambulatory Visit (INDEPENDENT_AMBULATORY_CARE_PROVIDER_SITE_OTHER): Payer: Self-pay | Admitting: *Deleted

## 2018-03-06 MED ORDER — RANITIDINE HCL 300 MG PO TABS: 300.0000 mg | ORAL_TABLET | Freq: Two times a day (BID) | ORAL | 5 refills | Status: DC

## 2018-03-07 ENCOUNTER — Encounter (INDEPENDENT_AMBULATORY_CARE_PROVIDER_SITE_OTHER): Payer: Self-pay | Admitting: *Deleted

## 2018-03-20 ENCOUNTER — Other Ambulatory Visit: Payer: Self-pay | Admitting: "Endocrinology

## 2018-03-22 ENCOUNTER — Telehealth (INDEPENDENT_AMBULATORY_CARE_PROVIDER_SITE_OTHER): Payer: Self-pay | Admitting: *Deleted

## 2018-03-22 NOTE — Telephone Encounter (Signed)
Called and left a message with asking the patient for a correct fax number for where the letter for the mediation is to be sent. Or , if she wanted us to mail it to her so that she may mail it to whom is requesting it.

## 2018-04-18 DIAGNOSIS — H401132 Primary open-angle glaucoma, bilateral, moderate stage: Secondary | ICD-10-CM | POA: Diagnosis not present

## 2018-04-18 DIAGNOSIS — H47293 Other optic atrophy, bilateral: Secondary | ICD-10-CM | POA: Diagnosis not present

## 2018-04-18 DIAGNOSIS — E103293 Type 1 diabetes mellitus with mild nonproliferative diabetic retinopathy without macular edema, bilateral: Secondary | ICD-10-CM | POA: Diagnosis not present

## 2018-04-19 DIAGNOSIS — E1065 Type 1 diabetes mellitus with hyperglycemia: Secondary | ICD-10-CM | POA: Diagnosis not present

## 2018-04-20 LAB — COMPLETE METABOLIC PANEL WITH GFR
AG RATIO: 1.6 (calc) (ref 1.0–2.5)
ALBUMIN MSPROF: 4.2 g/dL (ref 3.6–5.1)
ALKALINE PHOSPHATASE (APISO): 58 U/L (ref 33–130)
ALT: 11 U/L (ref 6–29)
AST: 15 U/L (ref 10–35)
BILIRUBIN TOTAL: 0.4 mg/dL (ref 0.2–1.2)
BUN / CREAT RATIO: 29 (calc) — AB (ref 6–22)
BUN: 29 mg/dL — ABNORMAL HIGH (ref 7–25)
CO2: 27 mmol/L (ref 20–32)
CREATININE: 1.01 mg/dL — AB (ref 0.60–0.93)
Calcium: 9.4 mg/dL (ref 8.6–10.4)
Chloride: 105 mmol/L (ref 98–110)
GFR, Est African American: 64 mL/min/{1.73_m2} (ref >=60)
GFR, Est Non African American: 56 mL/min/{1.73_m2} — ABNORMAL LOW (ref >=60)
GLOBULIN: 2.6 g/dL (ref 1.9–3.7)
Glucose, Bld: 225 mg/dL — ABNORMAL HIGH (ref 65–139)
Potassium: 4.3 mmol/L (ref 3.5–5.3)
SODIUM: 139 mmol/L (ref 135–146)
Total Protein: 6.8 g/dL (ref 6.1–8.1)

## 2018-04-20 LAB — HEMOGLOBIN A1C
HEMOGLOBIN A1C: 7.6 %{Hb} — AB (ref <5.7)
Mean Plasma Glucose: 171 (calc)
eAG (mmol/L): 9.5 (calc)

## 2018-04-20 LAB — MICROALBUMIN / CREATININE URINE RATIO
Creatinine, Urine: 110 mg/dL (ref 20–275)
MICROALB UR: 1 mg/dL
Microalb Creat Ratio: 9 mcg/mg creat (ref <30)

## 2018-04-20 LAB — T4, FREE: Free T4: 1.1 ng/dL (ref 0.8–1.8)

## 2018-04-20 LAB — TSH: TSH: 0.83 m[IU]/L (ref 0.40–4.50)

## 2018-04-25 ENCOUNTER — Encounter: Payer: Self-pay | Admitting: "Endocrinology

## 2018-04-25 ENCOUNTER — Ambulatory Visit (INDEPENDENT_AMBULATORY_CARE_PROVIDER_SITE_OTHER): Payer: Medicare Other | Admitting: "Endocrinology

## 2018-04-25 VITALS — BP 140/72 | HR 80 | Ht 59.0 in | Wt 140.0 lb

## 2018-04-25 DIAGNOSIS — I10 Essential (primary) hypertension: Secondary | ICD-10-CM | POA: Diagnosis not present

## 2018-04-25 DIAGNOSIS — I6529 Occlusion and stenosis of unspecified carotid artery: Secondary | ICD-10-CM | POA: Diagnosis not present

## 2018-04-25 DIAGNOSIS — E1065 Type 1 diabetes mellitus with hyperglycemia: Secondary | ICD-10-CM

## 2018-04-25 DIAGNOSIS — E782 Mixed hyperlipidemia: Secondary | ICD-10-CM | POA: Diagnosis not present

## 2018-04-25 NOTE — Progress Notes (Signed)
Endocrinology follow-up note  Subjective:    Patient ID: Nancy Sutton, female    DOB: April 12, 1946, PCP Elfredia Nevins, MD   Past Medical History:  Diagnosis Date  . Arthritis   . Diabetes mellitus    since 2008  . Essential hypertension, benign 09/10/2015  . GERD (gastroesophageal reflux disease)   . Hypercholesteremia   . Hyperlipidemia   . Stroke (HCC) 03/2011   only deficits-affected eyesight   Past Surgical History:  Procedure Laterality Date  . CATARACT EXTRACTION W/PHACO  11/01/2011   Procedure: CATARACT EXTRACTION PHACO AND INTRAOCULAR LENS PLACEMENT (IOC);  Surgeon: Gemma Payor, MD;  Location: [facility];  Service: Ophthalmology;  Laterality: Right;  CDE 12.48  . ESOPHAGEAL DILATION N/A 04/29/2017   Procedure: ESOPHAGEAL DILATION;  Surgeon: Malissa Hippo, MD;  Location: [facility];  Service: Endoscopy;  Laterality: N/A;  . ESOPHAGOGASTRODUODENOSCOPY N/A 04/29/2017   Procedure: ESOPHAGOGASTRODUODENOSCOPY (EGD);  Surgeon: Malissa Hippo, MD;  Location: [facility];  Service: Endoscopy;  Laterality: N/A;  1015  . ESOPHAGOGASTRODUODENOSCOPY (EGD) WITH ESOPHAGEAL DILATION N/A 02/14/2013   Procedure: ESOPHAGOGASTRODUODENOSCOPY (EGD) WITH ESOPHAGEAL DILATION;  Surgeon: Malissa Hippo, MD;  Location: [facility];  Service: Endoscopy;  Laterality: N/A;  200-moved to 255 Ann to notify pt  . EYE SURGERY  10-2010   left eye removal of cataract  . TUBAL LIGATION  1984   Social History   Socioeconomic History  . Marital status: Married    Spouse name: wayne  . Number of children: 2  . Years of education: 38  . Highest education level: Not on file  Occupational History  . 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 Financial resource strain: Not on file  . Food insecurity:    Worry: Not on file    Inability: Not on file  . Transportation needs:    Medical: Not on file    Non-medical: Not on file  Tobacco Use  . Smoking  status: Never Smoker  . Smokeless tobacco: Never Used  Substance and Sexual Activity  . Alcohol use: No    Alcohol/week: 0.0 oz  . Drug use: No  . Sexual activity: Yes    Birth control/protection: None  Lifestyle  . Physical activity:    Days per week: Not on file    Minutes per session: Not on file  . Stress: Not on file  Relationships  . Social connections:    Talks on phone: Not on file    Gets together: Not on file    Attends religious service: Not on file    Active member of club or organization: Not on file    Attends meetings of clubs or organizations: Not on file    Relationship status: Not on file  Other Topics Concern  . Not on file  Social History Narrative   Patient is married with 2 children.   Patient is right handed.   Patient has hs education.   Patient drinks 3-5 glasses of tea daily.   Outpatient Encounter Medications as of 04/25/2018  Medication Sig  . Insulin Detemir (LEVEMIR FLEXTOUCH [place]) Inject 40 Units into the skin at bedtime.  . brimonidine (ALPHAGAN) 0.2 % ophthalmic solution Place 1 drop into the right eye 3 (three) times daily.  . calcium carbonate (TUMS - DOSED IN MG ELEMENTAL CALCIUM) 500 MG chewable tablet Chew 1 tablet by mouth at bedtime as needed for indigestion or heartburn.  Marland Kitchen  clopidogrel (PLAVIX) 75 MG tablet Take 1 tablet (75 mg total) by mouth daily.  . dorzolamide-timolol (COSOPT) 22.3-6.8 MG/ML ophthalmic solution Place 1 drop into both eyes 2 (two) times daily.  Marland Kitchen GLOBAL EASE INJECT PEN NEEDLES 32G X 4 MM MISC USE 4 PENTIPS EVERY DAY  . GLOBAL INJECT EASE LANCETS 30G MISC DIABETIC CLUB  . glucose blood (TRUE METRIX BLOOD GLUCOSE TEST) test strip Use as instructed 4 x daily  . l-methylfolate-B6-B12 (METANX) 3-35-2 MG TABS Take 1 tablet by mouth 2 (two) times daily.  Marland Kitchen latanoprost (XALATAN) 0.005 % ophthalmic solution Place 1 drop into the right eye at bedtime.  Marland Kitchen losartan (COZAAR) 25 MG tablet daily.  . Menthol-Methyl Salicylate (MUSCLE  RUB EX) Apply 1 application topically daily as needed (arthritis). [place] Gel  . Multiple Vitamin (ONE-A-DAY 55 PLUS PO) Take 1 tablet by mouth daily.   . naproxen sodium (ANAPROX) 220 MG tablet Take 220 mg by mouth daily as needed (pain).  . NOVOLOG FLEXPEN 100 UNIT/ML FlexPen INJECT 12 - 18 UNITS INTO THE SKIN THREE TIMES DAILY WITH MEALS (DO NOT INJECT IF PREMEAL GLUCOSE IS BELOW 70)  . pantoprazole (PROTONIX) 40 MG tablet Take 1 tablet (40 mg total) by mouth 2 (two) times daily.  . ranitidine (ZANTAC) 300 MG tablet Take 1 tablet (300 mg total) by mouth 2 (two) times daily.  . simvastatin (ZOCOR) 80 MG tablet Take 80 mg by mouth every evening.   . vitamin C (ASCORBIC ACID) 500 MG tablet Take 500 mg by mouth daily.  . vitamin E (VITAMIN E) 400 UNIT capsule Take 400 Units by mouth daily.  . [DISCONTINUED] Continuous Blood Gluc Sensor (FREESTYLE LIBRE SENSOR SYSTEM) MISC Use one sensor every 10 days.  . [DISCONTINUED] insulin degludec (TRESIBA FLEXTOUCH) 100 UNIT/ML SOPN FlexTouch Pen Inject 40 Units into the skin at bedtime.  . [DISCONTINUED] lisinopril (PRINIVIL,ZESTRIL) 10 MG tablet Take 10 mg by mouth daily.    No facility-administered encounter medications on file as of 04/25/2018.    ALLERGIES: Allergies  Allergen Reactions  . Ampicillin Hives    Breaks out in bumps Has patient had a PCN reaction causing immediate rash, facial/tongue/throat swelling, SOB or lightheadedness with hypotension: No Has patient had a PCN reaction causing severe rash involving mucus membranes or skin necrosis: Yes Has patient had a PCN reaction that required hospitalization: No Has patient had a PCN reaction occurring within the last 10 years: No If all of the above answers are "NO", then may proceed with Cephalosporin use.   Therese Sarah [Bromfenac Sodium] Itching and Swelling  . Lisinopril Cough  . Tetanus Toxoids Other (See Comments)    Breaks out in bumps   VACCINATION STATUS: Immunization History   Administered Date(s) Administered  . Rabies, IM 02/13/2014, 02/16/2014, 02/20/2014, 02/27/2014    Diabetes  She presents for her follow-up diabetic visit. She has type 1 diabetes mellitus. Onset time: She was diagnosed at approximate age of 76 years. Her disease course has been improving. There are no hypoglycemic associated symptoms. Pertinent negatives for hypoglycemia include no confusion, headaches, pallor or seizures. Associated symptoms include blurred vision. Pertinent negatives for diabetes include no chest pain, no polydipsia, no polyphagia and no polyuria. There are no hypoglycemic complications. Symptoms are improving. Diabetic complications include a CVA. Current diabetic treatment includes intensive insulin program. She is compliant with treatment some of the time. Her weight is increasing steadily. She is following a generally unhealthy diet. When asked about meal planning, she reported none. She  has had a previous visit with a dietitian. She never participates in exercise. Her home blood glucose trend is increasing steadily. Her breakfast blood glucose range is generally 140-180 mg/dl. Her lunch blood glucose range is generally 140-180 mg/dl. Her dinner blood glucose range is generally 140-180 mg/dl. Her bedtime blood glucose range is generally 140-180 mg/dl. Her overall blood glucose range is 140-180 mg/dl. An ACE inhibitor/angiotensin II receptor blocker is being taken. Eye exam is current (She does have a visual deficit from her prior CVA.).  Hyperlipidemia  This is a chronic problem. The current episode started more than 1 year ago. Exacerbating diseases include diabetes. Pertinent negatives include no chest pain, myalgias or shortness of breath. Current antihyperlipidemic treatment includes statins. Risk factors for coronary artery disease include diabetes mellitus, dyslipidemia, hypertension and a sedentary lifestyle.  Hypertension  This is a chronic problem. The current episode  started more than 1 year ago. Associated symptoms include blurred vision. Pertinent negatives include no chest pain, headaches, palpitations or shortness of breath. Risk factors for coronary artery disease include dyslipidemia, diabetes mellitus, sedentary lifestyle and post-menopausal state. Past treatments include ACE inhibitors. Hypertensive end-organ damage includes CVA.    Review of Systems  Constitutional: Negative for chills, fever and unexpected weight change.  HENT: Negative for trouble swallowing and voice change.   Eyes: Positive for blurred vision. Negative for visual disturbance.  Respiratory: Negative for cough, shortness of breath and wheezing.   Cardiovascular: Negative for chest pain, palpitations and leg swelling.  Gastrointestinal: Negative for diarrhea, nausea and vomiting.  Endocrine: Negative for cold intolerance, heat intolerance, polydipsia, polyphagia and polyuria.  Musculoskeletal: Negative for arthralgias and myalgias.  Skin: Negative for color change, pallor, rash and wound.  Neurological: Negative for seizures and headaches.  Psychiatric/Behavioral: Negative for confusion and suicidal ideas.    Objective:    BP 140/72   Pulse 80   Wt 140 lb (63.5 kg)   BMI 28.28 kg/m   Wt Readings from Last 3 Encounters:  04/25/18 140 lb (63.5 kg)  02/28/18 139 lb 14.4 oz (63.5 kg)  02/02/18 139 lb (63 kg)    Physical Exam  Constitutional: She is oriented to person, place, and time. She appears well-developed.  HENT:  Head: Normocephalic and atraumatic.  Eyes: EOM are normal.  Neck: Normal range of motion. Neck supple. No tracheal deviation present. No thyromegaly present.  Cardiovascular: Normal rate.  Pulmonary/Chest: Effort normal.  Abdominal: Bowel sounds are normal. There is no tenderness. There is no guarding.  Musculoskeletal: Normal range of motion. She exhibits no edema.  Neurological: She is alert and oriented to person, place, and time. She has normal  reflexes. No cranial nerve deficit. Coordination normal.  Skin: Skin is warm and dry. No rash noted. No erythema. No pallor.  Psychiatric: She has a normal mood and affect. Judgment normal.     Lab Results  Component Value Date   WBC 10.9 (H) 02/13/2014   HGB 11.3 (L) 02/13/2014   HCT 33.4 (L) 02/13/2014   MCV 88.8 02/13/2014   PLT 225 02/13/2014   Chemistry (most recent): Lab Results  Component Value Date   NA 139 04/19/2018   K 4.3 04/19/2018   CL 105 04/19/2018   CO2 27 04/19/2018   BUN 29 (H) 04/19/2018   CREATININE 1.01 (H) 04/19/2018   Diabetic Labs (most recent): Lab Results  Component Value Date   HGBA1C 7.6 (H) 04/19/2018   HGBA1C 8.4 (H) 01/12/2018   HGBA1C 7.9 (H) 08/26/2017  Lipid Panel     Component Value Date/Time   CHOL 260 (H) 10/11/2016 1135   TRIG 98 10/11/2016 1135   HDL 96 10/11/2016 1135   CHOLHDL 2.7 10/11/2016 1135   VLDL 20 10/11/2016 1135   LDLCALC 144 (H) 10/11/2016 1135     Assessment & Plan:   1. 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 These are all discussed in detail with the patient.  -Ms. Filsaime returns with A1c of 7.6% improving from 8.4%.  Her blood glucose logs are consistently near target, no reported nor documented hypoglycemia.      - she is still unable to avoid simple carbohydrates like ice cream, saying that " it doesn't affect her glucose readings".  Glucose logs and insulin administration records pertaining to this visit,  to be scanned into patient's records.  Recent labs reviewed.  - I have re-counseled the patient on diet management   by adopting a carbohydrate restricted / protein rich  Diet.  -  Suggestion is made for her to avoid simple carbohydrates  from her diet including Cakes, Sweet Desserts /  Pastries, Ice Cream, Soda (diet and regular), Sweet Tea, Candies, Chips, Cookies, Store Bought Juices, Alcohol in Excess of  1-2 drinks a day, Artificial Sweeteners, and "Sugar-free" Products. This will help patient to have stable blood glucose profile and potentially avoid unintended weight gain.  - Patient is advised to stick to a routine mealtimes to eat 3 meals  a day and avoid unnecessary snacks ( to snack only to correct hypoglycemia).  - I have approached patient with the following individualized plan to manage diabetes and patient agrees.  - She has visual limitation and inconsistent meal pattern. - She is not a suitable candidate for insulin pump, however she would have benefited from continued glucose monitoring. She decided not to use her continuous glucose monitoring citing some itching on her skin around the sensor.  -Her insurance is paying better with Levemir than Tresiba, and she wishes to stay on Levemir. -I advised her to continue Levemir 40 units nightly,  continue  NovoLog 12 units  3 times a day before meals plus correction associated with strict monitoring of blood glucose 4 times a day-before meals and at bedtime.    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 -Patient is not a candidate for metformin,SGLT2 inhibitors, and incretin therapy. -Proper use of insulin is exclusive choice she has to treat diabetes to target.  - Patient specific target  for A1c; LDL, HDL, Triglycerides, and  Waist Circumference were discussed in detail.  2) BP/HTN: Her blood pressure is controlled to target.  She had a course of dry cough from lisinopril, which was switched to losartan only 2 days ago.      3) Lipids/HPL: Lately, she has been consistent taking her atorvastatin.  Her last LDL was 144.  She will continue atorvastatin 40 mg  p.o. nightly.  She will need fasting lipid panel on subsequent visits.   4)  Weight/Diet: CDE consult in progress, exercise, and carbohydrates information provided.  5) Chronic Care/Health Maintenance:  -Patient is on ACEI/ARB and Statin medications and encouraged to continue to follow  up with Ophthalmology, Podiatrist at least yearly or according to recommendations, and advised to  stay away from smoking. I have recommended yearly flu vaccine and pneumonia vaccination at least every 5 years; moderate intensity exercise for up to 150 minutes weekly; and  sleep for at least 7 hours a day.  - I advised patient to maintain close follow up with Elfredia Nevins, MD for primary care needs.  - Time spent with the patient: 25 min, of which >50% was spent in reviewing her blood glucose logs , discussing her hypo- and hyper-glycemic episodes, reviewing her current and  previous labs and insulin doses and developing a plan to avoid hypo- and hyper-glycemia. Please refer to Patient Instructions for Blood Glucose Monitoring and Insulin/Medications Dosing Guide"  in media tab for additional information. Lenn Sink participated in the discussions, expressed understanding, and voiced agreement with the above plans.  All questions were answered to her satisfaction. she is encouraged to contact clinic should she have any questions or concerns prior to her return visit.  Follow up plan: -Return in about 4 months (around 08/25/2018) for Meter, and Logs.  Marquis Lunch, MD Phone: [phone]  Fax: [phone]   This note was partially dictated with voice recognition software. Similar sounding words can be transcribed inadequately or may not  be corrected upon review.  04/25/2018, 12:58 PM

## 2018-04-25 NOTE — Patient Instructions (Signed)

## 2018-07-04 ENCOUNTER — Ambulatory Visit
Admission: RE | Admit: 2018-07-04 | Discharge: 2018-07-04 | Disposition: A | Discharge status: Home / Self Care | Payer: Medicare Other | Source: Ambulatory Visit | Attending: Internal Medicine | Admitting: Internal Medicine

## 2018-07-04 ENCOUNTER — Other Ambulatory Visit: Payer: Self-pay | Admitting: Internal Medicine

## 2018-07-04 DIAGNOSIS — R921 Mammographic calcification found on diagnostic imaging of breast: Secondary | ICD-10-CM

## 2018-07-19 DIAGNOSIS — H401132 Primary open-angle glaucoma, bilateral, moderate stage: Secondary | ICD-10-CM | POA: Diagnosis not present

## 2018-07-28 ENCOUNTER — Other Ambulatory Visit: Payer: Self-pay

## 2018-07-28 MED ORDER — INSULIN DETEMIR 100 UNIT/ML FLEXPEN: 50.0000 [IU] | PEN_INJECTOR | Freq: Every day | SUBCUTANEOUS | 2 refills | Status: DC

## 2018-08-11 ENCOUNTER — Telehealth: Payer: Self-pay

## 2018-08-11 NOTE — Telephone Encounter (Signed)
Advise to lower Novolog to 10 units TIDAC plus correction, continue levemir at 40 units qhs.

## 2018-08-11 NOTE — Telephone Encounter (Signed)
Pt states she has had a low BG readings that resulted in EMS having to be called   Date Before breakfast Before lunch Before supper Bedtime  11/20 215  227 117  11/21 157 73    35 - when EMS checked. Was given IV dextrose.  [phone]  11/22 273             Pt taking:  Levemir 40 units qhs, novolog 12-18 units tidac

## 2018-08-11 NOTE — Telephone Encounter (Signed)
Pt.notified

## 2018-08-23 DIAGNOSIS — E1065 Type 1 diabetes mellitus with hyperglycemia: Secondary | ICD-10-CM | POA: Diagnosis not present

## 2018-08-24 LAB — COMPLETE METABOLIC PANEL WITH GFR
AG Ratio: 1.6 (calc) (ref 1.0–2.5)
ALT: 12 U/L (ref 6–29)
AST: 17 U/L (ref 10–35)
Albumin: 4.4 g/dL (ref 3.6–5.1)
Alkaline phosphatase (APISO): 61 U/L (ref 33–130)
BILIRUBIN TOTAL: 0.6 mg/dL (ref 0.2–1.2)
BUN / CREAT RATIO: 29 (calc) — AB (ref 6–22)
BUN: 26 mg/dL — AB (ref 7–25)
CHLORIDE: 106 mmol/L (ref 98–110)
CO2: 26 mmol/L (ref 20–32)
Calcium: 10 mg/dL (ref 8.6–10.4)
Creat: 0.9 mg/dL (ref 0.60–0.93)
GFR, EST AFRICAN AMERICAN: 74 mL/min/{1.73_m2} (ref >=60)
GFR, EST NON AFRICAN AMERICAN: 64 mL/min/{1.73_m2} (ref >=60)
GLUCOSE: 119 mg/dL (ref 65–139)
Globulin: 2.7 g/dL (calc) (ref 1.9–3.7)
Potassium: 4.3 mmol/L (ref 3.5–5.3)
Sodium: 140 mmol/L (ref 135–146)
TOTAL PROTEIN: 7.1 g/dL (ref 6.1–8.1)

## 2018-08-24 LAB — HEMOGLOBIN A1C
Hgb A1c MFr Bld: 8.1 % of total Hgb — ABNORMAL HIGH (ref <5.7)
Mean Plasma Glucose: 186 (calc)
eAG (mmol/L): 10.3 (calc)

## 2018-08-30 ENCOUNTER — Encounter: Payer: Self-pay | Admitting: "Endocrinology

## 2018-08-30 ENCOUNTER — Ambulatory Visit (INDEPENDENT_AMBULATORY_CARE_PROVIDER_SITE_OTHER): Payer: Medicare Other | Admitting: "Endocrinology

## 2018-08-30 VITALS — BP 138/78 | HR 80 | Ht 59.0 in | Wt 138.0 lb

## 2018-08-30 DIAGNOSIS — I6529 Occlusion and stenosis of unspecified carotid artery: Secondary | ICD-10-CM | POA: Diagnosis not present

## 2018-08-30 DIAGNOSIS — E782 Mixed hyperlipidemia: Secondary | ICD-10-CM | POA: Diagnosis not present

## 2018-08-30 DIAGNOSIS — I10 Essential (primary) hypertension: Secondary | ICD-10-CM

## 2018-08-30 DIAGNOSIS — E1065 Type 1 diabetes mellitus with hyperglycemia: Secondary | ICD-10-CM | POA: Diagnosis not present

## 2018-08-30 MED ORDER — INSULIN DETEMIR 100 UNIT/ML FLEXPEN: 40.0000 [IU] | PEN_INJECTOR | Freq: Every day | SUBCUTANEOUS | 2 refills | Status: DC

## 2018-08-30 MED ORDER — INSULIN ASPART 100 UNIT/ML FLEXPEN: PEN_INJECTOR | SUBCUTANEOUS | 2 refills | Status: DC

## 2018-08-30 NOTE — Progress Notes (Signed)
Endocrinology follow-up note  Subjective:    Patient ID: Nancy Sutton, female    DOB: Nov 16, 1945, PCP Elfredia Nevins, MD   Past Medical History:  Diagnosis Date  . Arthritis   . Diabetes mellitus    since 2008  . Essential hypertension, benign 09/10/2015  . GERD (gastroesophageal reflux disease)   . Hypercholesteremia   . Hyperlipidemia   . Stroke (HCC) 03/2011   only deficits-affected eyesight   Past Surgical History:  Procedure Laterality Date  . CATARACT EXTRACTION W/PHACO  11/01/2011   Procedure: CATARACT EXTRACTION PHACO AND INTRAOCULAR LENS PLACEMENT (IOC);  Surgeon: Gemma Payor, MD;  Location: [facility];  Service: Ophthalmology;  Laterality: Right;  CDE 12.48  . ESOPHAGEAL DILATION N/A 04/29/2017   Procedure: ESOPHAGEAL DILATION;  Surgeon: Malissa Hippo, MD;  Location: [facility];  Service: Endoscopy;  Laterality: N/A;  . ESOPHAGOGASTRODUODENOSCOPY N/A 04/29/2017   Procedure: ESOPHAGOGASTRODUODENOSCOPY (EGD);  Surgeon: Malissa Hippo, MD;  Location: [facility];  Service: Endoscopy;  Laterality: N/A;  1015  . ESOPHAGOGASTRODUODENOSCOPY (EGD) WITH ESOPHAGEAL DILATION N/A 02/14/2013   Procedure: ESOPHAGOGASTRODUODENOSCOPY (EGD) WITH ESOPHAGEAL DILATION;  Surgeon: Malissa Hippo, MD;  Location: [facility];  Service: Endoscopy;  Laterality: N/A;  200-moved to 255 Ann to notify pt  . EYE SURGERY  10-2010   left eye removal of cataract  . TUBAL LIGATION  1984   Social History   Socioeconomic History  . Marital status: Married    Spouse name: wayne  . Number of children: 2  . Years of education: 34  . Highest education level: Not on file  Occupational History  . 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 Financial resource strain: Not on file  . Food insecurity:    Worry: Not on file    Inability: Not on file  . Transportation needs:    Medical: Not on file    Non-medical: Not on file  Tobacco Use  . Smoking  status: Never Smoker  . Smokeless tobacco: Never Used  Substance and Sexual Activity  . Alcohol use: No    Alcohol/week: 0.0 standard drinks  . Drug use: No  . Sexual activity: Yes    Birth control/protection: None  Lifestyle  . Physical activity:    Days per week: Not on file    Minutes per session: Not on file  . Stress: Not on file  Relationships  . Social connections:    Talks on phone: Not on file    Gets together: Not on file    Attends religious service: Not on file    Active member of club or organization: Not on file    Attends meetings of clubs or organizations: Not on file    Relationship status: Not on file  Other Topics Concern  . Not on file  Social History Narrative   Patient is married with 2 children.   Patient is right handed.   Patient has hs education.   Patient drinks 3-5 glasses of tea daily.   Outpatient Encounter Medications as of 08/30/2018  Medication Sig  . brimonidine (ALPHAGAN) 0.2 % ophthalmic solution Place 1 drop into the right eye 3 (three) times daily.  . calcium carbonate (TUMS - DOSED IN MG ELEMENTAL CALCIUM) 500 MG chewable tablet Chew 1 tablet by mouth at bedtime as needed for indigestion or heartburn.  . clopidogrel (PLAVIX) 75 MG tablet Take 1 tablet (75 mg total) by mouth daily.  Marland Kitchen  dorzolamide-timolol (COSOPT) 22.3-6.8 MG/ML ophthalmic solution Place 1 drop into both eyes 2 (two) times daily.  Marland Kitchen. GLOBAL EASE INJECT PEN NEEDLES 32G X 4 MM MISC USE 4 PENTIPS EVERY DAY  . GLOBAL INJECT EASE LANCETS 30G MISC DIABETIC CLUB  . glucose blood (TRUE METRIX BLOOD GLUCOSE TEST) test strip Use as instructed 4 x daily  . insulin aspart (NOVOLOG FLEXPEN) 100 UNIT/ML FlexPen INJECT 10-13 UNITS INTO THE SKIN THREE TIMES DAILY WITH MEALS (DO NOT INJECT IF PREMEAL GLUCOSE IS BELOW 70)  . Insulin Detemir (LEVEMIR FLEXTOUCH) 100 UNIT/ML Pen Inject 40 Units into the skin at bedtime.  Marland Kitchen. l-methylfolate-B6-B12 (METANX) 3-35-2 MG TABS Take 1 tablet by mouth 2  (two) times daily.  Marland Kitchen. latanoprost (XALATAN) 0.005 % ophthalmic solution Place 1 drop into the right eye at bedtime.  Marland Kitchen. losartan (COZAAR) 25 MG tablet daily.  . Menthol-Methyl Salicylate (MUSCLE RUB EX) Apply 1 application topically daily as needed (arthritis). [place] Gel  . Multiple Vitamin (ONE-A-DAY 55 PLUS PO) Take 1 tablet by mouth daily.   . naproxen sodium (ANAPROX) 220 MG tablet Take 220 mg by mouth daily as needed (pain).  . pantoprazole (PROTONIX) 40 MG tablet Take 1 tablet (40 mg total) by mouth 2 (two) times daily.  . ranitidine (ZANTAC) 300 MG tablet Take 1 tablet (300 mg total) by mouth 2 (two) times daily.  . simvastatin (ZOCOR) 80 MG tablet Take 80 mg by mouth every evening.   . vitamin C (ASCORBIC ACID) 500 MG tablet Take 500 mg by mouth daily.  . vitamin E (VITAMIN E) 400 UNIT capsule Take 400 Units by mouth daily.  . [DISCONTINUED] Insulin Detemir (LEVEMIR FLEXTOUCH) 100 UNIT/ML Pen Inject 50 Units into the skin at bedtime.  . [DISCONTINUED] NOVOLOG FLEXPEN 100 UNIT/ML FlexPen INJECT 12 - 18 UNITS INTO THE SKIN THREE TIMES DAILY WITH MEALS (DO NOT INJECT IF PREMEAL GLUCOSE IS BELOW 70)   No facility-administered encounter medications on file as of 08/30/2018.    ALLERGIES: Allergies  Allergen Reactions  . Ampicillin Hives    Breaks out in bumps Has patient had a PCN reaction causing immediate rash, facial/tongue/throat swelling, SOB or lightheadedness with hypotension: No Has patient had a PCN reaction causing severe rash involving mucus membranes or skin necrosis: Yes Has patient had a PCN reaction that required hospitalization: No Has patient had a PCN reaction occurring within the last 10 years: No If all of the above answers are "NO", then may proceed with Cephalosporin use.   Therese Sarah. Bromday [Bromfenac Sodium] Itching and Swelling  . Lisinopril Cough  . Tetanus Toxoids Other (See Comments)    Breaks out in bumps   VACCINATION STATUS: Immunization History   Administered Date(s) Administered  . Rabies, IM 02/13/2014, 02/16/2014, 02/20/2014, 02/27/2014    Diabetes  She presents for her follow-up diabetic visit. She has type 1 diabetes mellitus. Onset time: She was diagnosed at approximate age of 72 years. Her disease course has been improving. There are no hypoglycemic associated symptoms. Pertinent negatives for hypoglycemia include no confusion, headaches, pallor or seizures. Associated symptoms include blurred vision. Pertinent negatives for diabetes include no chest pain, no polydipsia, no polyphagia and no polyuria. There are no hypoglycemic complications. Symptoms are improving. Diabetic complications include a CVA. Current diabetic treatment includes intensive insulin program. She is compliant with treatment some of the time. Her weight is fluctuating minimally. She is following a generally unhealthy diet. When asked about meal planning, she reported none. She has had a previous  visit with a dietitian. She never participates in exercise. Her home blood glucose trend is fluctuating dramatically. Her breakfast blood glucose range is generally 140-180 mg/dl. Her lunch blood glucose range is generally 140-180 mg/dl. Her dinner blood glucose range is generally 140-180 mg/dl. Her bedtime blood glucose range is generally 140-180 mg/dl. Her overall blood glucose range is 140-180 mg/dl. An ACE inhibitor/angiotensin II receptor blocker is being taken. Eye exam is current (She does have a visual deficit from her prior CVA.).  Hyperlipidemia  This is a chronic problem. The current episode started more than 1 year ago. Exacerbating diseases include diabetes. Pertinent negatives include no chest pain, myalgias or shortness of breath. Current antihyperlipidemic treatment includes statins. Risk factors for coronary artery disease include diabetes mellitus, dyslipidemia, hypertension and a sedentary lifestyle.  Hypertension  This is a chronic problem. The current  episode started more than 1 year ago. Associated symptoms include blurred vision. Pertinent negatives include no chest pain, headaches, palpitations or shortness of breath. Risk factors for coronary artery disease include dyslipidemia, diabetes mellitus, sedentary lifestyle and post-menopausal state. Past treatments include ACE inhibitors. Hypertensive end-organ damage includes CVA.    Review of Systems  Constitutional: Negative for chills, fever and unexpected weight change.  HENT: Negative for trouble swallowing and voice change.   Eyes: Positive for blurred vision. Negative for visual disturbance.  Respiratory: Negative for cough, shortness of breath and wheezing.   Cardiovascular: Negative for chest pain, palpitations and leg swelling.  Gastrointestinal: Negative for diarrhea, nausea and vomiting.  Endocrine: Negative for cold intolerance, heat intolerance, polydipsia, polyphagia and polyuria.  Musculoskeletal: Negative for arthralgias and myalgias.  Skin: Negative for color change, pallor, rash and wound.  Neurological: Negative for seizures and headaches.  Psychiatric/Behavioral: Negative for confusion and suicidal ideas.    Objective:    BP 138/78   Pulse 80   Ht 4\' 11"  (1.499 m)   Wt 138 lb (62.6 kg)   BMI 27.87 kg/m   Wt Readings from Last 3 Encounters:  08/30/18 138 lb (62.6 kg)  04/25/18 140 lb (63.5 kg)  02/28/18 139 lb 14.4 oz (63.5 kg)    Physical Exam  Constitutional: She is oriented to person, place, and time. She appears well-developed.  HENT:  Head: Normocephalic and atraumatic.  Eyes: EOM are normal.  Neck: Normal range of motion. Neck supple. No tracheal deviation present. No thyromegaly present.  Cardiovascular: Normal rate.  Pulmonary/Chest: Effort normal.  Abdominal: Bowel sounds are normal. There is no tenderness. There is no guarding.  Musculoskeletal: Normal range of motion. She exhibits no edema.  Neurological: She is alert and oriented to person,  place, and time. She has normal reflexes. No cranial nerve deficit. Coordination normal.  Skin: Skin is warm and dry. No rash noted. No erythema. No pallor.  Psychiatric: She has a normal mood and affect. Judgment normal.     Lab Results  Component Value Date   WBC 10.9 (H) 02/13/2014   HGB 11.3 (L) 02/13/2014   HCT 33.4 (L) 02/13/2014   MCV 88.8 02/13/2014   PLT 225 02/13/2014   Chemistry (most recent): Lab Results  Component Value Date   NA 140 08/23/2018   K 4.3 08/23/2018   CL 106 08/23/2018   CO2 26 08/23/2018   BUN 26 (H) 08/23/2018   CREATININE 0.90 08/23/2018   Diabetic Labs (most recent): Lab Results  Component Value Date   HGBA1C 8.1 (H) 08/23/2018   HGBA1C 7.6 (H) 04/19/2018   HGBA1C 8.4 (H) 01/12/2018  Lipid Panel     Component Value Date/Time   CHOL 260 (H) 10/11/2016 1135   TRIG 98 10/11/2016 1135   HDL 96 10/11/2016 1135   CHOLHDL 2.7 10/11/2016 1135   VLDL 20 10/11/2016 1135   LDLCALC 144 (H) 10/11/2016 1135     Assessment & Plan:   1. 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 These are all discussed in detail with the patient.  -Ms. Crimi returns with A1c of 8.1%, increasing from 7.6%.  More concerning, in the interim, she did have one severe episode of hypoglycemia down to 36 which required EMS intervention.  This was associated with a missed meal.   -She still has a habit of missing meals intermittently.    - she is still unable to avoid simple carbohydrates like ice cream, saying that " it doesn't affect her glucose readings".  Glucose logs and insulin administration records pertaining to this visit,  to be scanned into patient's records.  Recent labs reviewed.  - I have re-counseled the patient on diet management   by adopting a  carbohydrate restricted / protein rich  Diet.  -  Suggestion is made for her to avoid simple carbohydrates  from her diet including Cakes, Sweet Desserts / Pastries, Ice Cream, Soda (diet and regular), Sweet Tea, Candies, Chips, Cookies, Store Bought Juices, Alcohol in Excess of  1-2 drinks a day, Artificial Sweeteners, and "Sugar-free" Products. This will help patient to have stable blood glucose profile and potentially avoid unintended weight gain.   - Patient is advised to stick to a routine mealtimes to eat 3 meals  a day and avoid unnecessary snacks ( to snack only to correct hypoglycemia).  - I have approached patient with the following individualized plan to manage diabetes and patient agrees.  - She has visual limitation and inconsistent meal pattern.  -Her insurance is paying better with Levemir than Tresiba, and she wishes to stay on Levemir. -She is advised to continue Levemir 40 units nightly.given her recent severe hypoglycemic episode, I discussed and lowered her NovoLog to 10 units  3 times a day before meals plus correction associated with strict monitoring of blood glucose 4 times a day-before meals and at bedtime.    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 -Patient is not a candidate for metformin,SGLT2 inhibitors, and incretin therapy. -Proper use of insulin is exclusive choice she has to treat diabetes to target.  - Patient specific target  for A1c; LDL, HDL, Triglycerides, and  Waist Circumference were discussed in detail.  2) BP/HTN: Her blood pressure is controlled to target.  He has tolerated losartan better since she was switched from lisinopril due to dry cough.    3) Lipids/HPL: Lately, she has been consistent taking her atorvastatin.  Her last LDL was 144.  She will continue atorvastatin 40 mg p.o.  nightly.  She will need fasting lipid panel on subsequent visits.   4)  Weight/Diet: CDE consult in progress, exercise, and carbohydrates information provided.  5) Chronic Care/Health Maintenance:  -Patient is on ACEI/ARB and Statin medications and encouraged to continue to follow up with Ophthalmology, Podiatrist at least yearly or according  to recommendations, and advised to  stay away from smoking. I have recommended yearly flu vaccine and pneumonia vaccination at least every 5 years; moderate intensity exercise for up to 150 minutes weekly; and  sleep for at least 7 hours a day.  - I advised patient to maintain close follow up with Elfredia Nevins, MD for primary care needs.  - Time spent with the patient: 25 min, of which >50% was spent in reviewing her blood glucose logs , discussing her hypo- and hyper-glycemic episodes, reviewing her current and  previous labs and insulin doses and developing a plan to avoid hypo- and hyper-glycemia. Please refer to Patient Instructions for Blood Glucose Monitoring and Insulin/Medications Dosing Guide"  in media tab for additional information. Lenn Sink participated in the discussions, expressed understanding, and voiced agreement with the above plans.  All questions were answered to her satisfaction. she is encouraged to contact clinic should she have any questions or concerns prior to her return visit.   Follow up plan: -Return in about 3 months (around 11/29/2018) for Follow up with Pre-visit Labs, Meter, and Logs.  Marquis Lunch, MD Phone: [phone]  Fax: [phone]   This note was partially dictated with voice recognition software. Similar sounding words can be transcribed inadequately or may not  be corrected upon review.  08/30/2018, 2:45 PM

## 2018-09-05 ENCOUNTER — Ambulatory Visit (INDEPENDENT_AMBULATORY_CARE_PROVIDER_SITE_OTHER): Payer: Medicare Other | Admitting: Internal Medicine

## 2018-09-05 ENCOUNTER — Encounter (INDEPENDENT_AMBULATORY_CARE_PROVIDER_SITE_OTHER): Payer: Self-pay | Admitting: Internal Medicine

## 2018-09-05 VITALS — BP 118/72 | HR 68 | Temp 97.4°F | Resp 18 | Ht 59.0 in | Wt 138.8 lb

## 2018-09-05 DIAGNOSIS — I6529 Occlusion and stenosis of unspecified carotid artery: Secondary | ICD-10-CM

## 2018-09-05 DIAGNOSIS — K2270 Barrett's esophagus without dysplasia: Secondary | ICD-10-CM

## 2018-09-05 DIAGNOSIS — K2100 Gastro-esophageal reflux disease with esophagitis, without bleeding: Secondary | ICD-10-CM

## 2018-09-05 NOTE — Patient Instructions (Signed)
Notify if you have swallowing difficulty or if pantoprazole stops working. 

## 2018-09-05 NOTE — Progress Notes (Signed)
Presenting complaint;  Follow-up for complicated GERD.  Database and subjective:  Patient is 72 year old Caucasian female who was chronic GERD complicated by esophageal stricture and short segment Barrett's esophagus was here for scheduled visit.  She was last seen in June this year.  Last EGD with ED and biopsy was in August 2018. She is here for scheduled visit accompanied by her husband. She states she is doing well with double dose PPI and she takes Tums usually twice a week at bedtime if she eats late or if she eats food that she knows may give her heartburn.  She did try to drop evening dose of PPI but started to have terrible heartburn.  She therefore went back on twice daily scheduled.  She is not having any side effects with PPI.  She has heartburn no more than once a week.  She is not having dysphagia sore throat cough or hoarseness.  Her bowels move daily.  She denies melena or rectal bleeding.  She and her husband walk every day except Sunday. She also watches her diet.  She eats baked food most of the time and she also has quit eating red meat and eats beyond meat.   Current Medications: Outpatient Encounter Medications as of 09/05/2018  Medication Sig  . brimonidine (ALPHAGAN) 0.2 % ophthalmic solution Place 1 drop into both eyes 3 (three) times daily.   . calcium carbonate (TUMS - DOSED IN MG ELEMENTAL CALCIUM) 500 MG chewable tablet Chew 1 tablet by mouth at bedtime as needed for indigestion or heartburn.  . clopidogrel (PLAVIX) 75 MG tablet Take 1 tablet (75 mg total) by mouth daily.  . dorzolamide-timolol (COSOPT) 22.3-6.8 MG/ML ophthalmic solution Place 1 drop into both eyes 2 (two) times daily.  Marland Kitchen GLOBAL EASE INJECT PEN NEEDLES 32G X 4 MM MISC USE 4 PENTIPS EVERY DAY  . GLOBAL INJECT EASE LANCETS 30G MISC DIABETIC CLUB  . glucose blood (TRUE METRIX BLOOD GLUCOSE TEST) test strip Use as instructed 4 x daily  . insulin aspart (NOVOLOG FLEXPEN) 100 UNIT/ML FlexPen INJECT 10-13  UNITS INTO THE SKIN THREE TIMES DAILY WITH MEALS (DO NOT INJECT IF PREMEAL GLUCOSE IS BELOW 70)  . Insulin Detemir (LEVEMIR FLEXTOUCH) 100 UNIT/ML Pen Inject 40 Units into the skin at bedtime.  Marland Kitchen l-methylfolate-B6-B12 (METANX) 3-35-2 MG TABS Take 1 tablet by mouth 2 (two) times daily.  Marland Kitchen latanoprost (XALATAN) 0.005 % ophthalmic solution Place 1 drop into the right eye at bedtime.  Marland Kitchen losartan (COZAAR) 25 MG tablet Take 25 mg by mouth daily.   . Menthol-Methyl Salicylate (MUSCLE RUB EX) Apply 1 application topically daily as needed (arthritis). [place] Gel  . Multiple Vitamin (ONE-A-DAY 55 PLUS PO) Take 1 tablet by mouth daily.   . naproxen sodium (ANAPROX) 220 MG tablet Take 220 mg by mouth daily as needed (pain).  . pantoprazole (PROTONIX) 40 MG tablet Take 1 tablet (40 mg total) by mouth 2 (two) times daily.  . simvastatin (ZOCOR) 80 MG tablet Take 80 mg by mouth every evening.   . vitamin C (ASCORBIC ACID) 500 MG tablet Take 500 mg by mouth daily.  . vitamin E (VITAMIN E) 400 UNIT capsule Take 400 Units by mouth daily.  . [DISCONTINUED] ranitidine (ZANTAC) 300 MG tablet Take 1 tablet (300 mg total) by mouth 2 (two) times daily. (Patient not taking: Reported on 09/05/2018)   No facility-administered encounter medications on file as of 09/05/2018.      Objective: Blood pressure 118/72, pulse 68, temperature (!)  97.4 F (36.3 C), temperature source Oral, resp. rate 18, height 4\' 11"  (1.499 m), weight 138 lb 12.8 oz (63 kg). Patient is alert and in no acute distress. Conjunctiva is pink. Sclera is nonicteric Oropharyngeal mucosa is normal. No neck masses or thyromegaly noted. Cardiac exam with regular rhythm normal S1 and S2. No murmur or gallop noted. Lungs are clear to auscultation. Abdomen is symmetrical soft and nontender with organomegaly or masses. No LE edema or clubbing noted. 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   Assessment:  #1.  Chronic GERD  complicated by short segment Barrett's esophagus and distal esophageal stricture which was lost dilated/manipulated in August 2018.  She remains free of dysphagia.  GERD symptoms are well controlled with therapy.  She did not tolerate single dose PPI.  She will therefore continue twice daily PPI.  She take clopidogrel about 5 hours after taking second dose of PPI in order to minimize interaction.   Plan:  Continue antireflux measures and pantoprazole at 40 mg p.o. twice daily. Unless she develops dysphagia her next EGD would be in August 2023. Office visit in 1 year.

## 2018-09-29 ENCOUNTER — Other Ambulatory Visit: Payer: Self-pay | Admitting: "Endocrinology

## 2018-10-20 DIAGNOSIS — E103293 Type 1 diabetes mellitus with mild nonproliferative diabetic retinopathy without macular edema, bilateral: Secondary | ICD-10-CM | POA: Diagnosis not present

## 2018-10-20 DIAGNOSIS — H401132 Primary open-angle glaucoma, bilateral, moderate stage: Secondary | ICD-10-CM | POA: Diagnosis not present

## 2018-10-20 DIAGNOSIS — H47293 Other optic atrophy, bilateral: Secondary | ICD-10-CM | POA: Diagnosis not present

## 2018-11-29 DIAGNOSIS — E1129 Type 2 diabetes mellitus with other diabetic kidney complication: Secondary | ICD-10-CM | POA: Diagnosis not present

## 2018-11-29 DIAGNOSIS — E1065 Type 1 diabetes mellitus with hyperglycemia: Secondary | ICD-10-CM | POA: Diagnosis not present

## 2018-11-30 DIAGNOSIS — I63139 Cerebral infarction due to embolism of unspecified carotid artery: Secondary | ICD-10-CM | POA: Diagnosis not present

## 2018-11-30 DIAGNOSIS — E1129 Type 2 diabetes mellitus with other diabetic kidney complication: Secondary | ICD-10-CM | POA: Diagnosis not present

## 2018-11-30 DIAGNOSIS — I6522 Occlusion and stenosis of left carotid artery: Secondary | ICD-10-CM | POA: Diagnosis not present

## 2018-11-30 DIAGNOSIS — Z1389 Encounter for screening for other disorder: Secondary | ICD-10-CM | POA: Diagnosis not present

## 2018-11-30 DIAGNOSIS — Z6827 Body mass index (BMI) 27.0-27.9, adult: Secondary | ICD-10-CM | POA: Diagnosis not present

## 2018-11-30 LAB — COMPLETE METABOLIC PANEL WITH GFR
AG Ratio: 1.4 (calc) (ref 1.0–2.5)
ALT: 16 U/L (ref 6–29)
AST: 18 U/L (ref 10–35)
Albumin: 4.2 g/dL (ref 3.6–5.1)
Alkaline phosphatase (APISO): 60 U/L (ref 37–153)
BUN / CREAT RATIO: 24 (calc) — AB (ref 6–22)
BUN: 23 mg/dL (ref 7–25)
CALCIUM: 9.5 mg/dL (ref 8.6–10.4)
CO2: 27 mmol/L (ref 20–32)
CREATININE: 0.96 mg/dL — AB (ref 0.60–0.93)
Chloride: 106 mmol/L (ref 98–110)
GFR, Est African American: 68 mL/min/{1.73_m2} (ref >=60)
GFR, Est Non African American: 59 mL/min/{1.73_m2} — ABNORMAL LOW (ref >=60)
Globulin: 2.9 g/dL (calc) (ref 1.9–3.7)
Glucose, Bld: 239 mg/dL — ABNORMAL HIGH (ref 65–139)
Potassium: 4.3 mmol/L (ref 3.5–5.3)
SODIUM: 141 mmol/L (ref 135–146)
Total Bilirubin: 0.5 mg/dL (ref 0.2–1.2)
Total Protein: 7.1 g/dL (ref 6.1–8.1)

## 2018-11-30 LAB — HEMOGLOBIN A1C
Hgb A1c MFr Bld: 9 % of total Hgb — ABNORMAL HIGH (ref <5.7)
Mean Plasma Glucose: 212 (calc)
eAG (mmol/L): 11.7 (calc)

## 2018-12-01 ENCOUNTER — Other Ambulatory Visit: Payer: Self-pay | Admitting: Internal Medicine

## 2018-12-01 DIAGNOSIS — I6529 Occlusion and stenosis of unspecified carotid artery: Secondary | ICD-10-CM

## 2018-12-03 ENCOUNTER — Other Ambulatory Visit: Payer: Self-pay | Admitting: "Endocrinology

## 2018-12-04 ENCOUNTER — Other Ambulatory Visit (HOSPITAL_COMMUNITY): Payer: Self-pay | Admitting: Internal Medicine

## 2018-12-04 DIAGNOSIS — I6529 Occlusion and stenosis of unspecified carotid artery: Secondary | ICD-10-CM

## 2018-12-05 ENCOUNTER — Telehealth: Payer: Self-pay | Admitting: *Deleted

## 2018-12-05 NOTE — Telephone Encounter (Signed)
Let pt know she stated she would call back with readings.

## 2018-12-05 NOTE — Telephone Encounter (Signed)
Her labs shows that her A1c is higher at 9%, she may need adjustment on her treatment.  She needs to give [place] her readings for the last 3 days.

## 2018-12-05 NOTE — Telephone Encounter (Signed)
Pt called stating she doesn't feel safe coming in the office at this time wanted to know if you could call and give her her lab results over the phone

## 2018-12-06 ENCOUNTER — Ambulatory Visit: Payer: Medicare Other | Admitting: "Endocrinology

## 2018-12-08 ENCOUNTER — Telehealth: Payer: Self-pay

## 2018-12-08 NOTE — Telephone Encounter (Signed)
   OK. She can resume and continue Levemir 40 units nightly.   NovoLog 10 units  3 times a day before meals plus correction associated with strict monitoring of blood glucose 4 times a day-before meals and at bedtime.

## 2018-12-08 NOTE — Telephone Encounter (Signed)
Nancy Sutton is stating that she knows why her Hgb A1 C was 9 is because she had stopped eating meat because her husband couldn't have it he has the tick disease, but since she has put meat back in her diet and her readings have been better  Tues 3/17 Bedtime 152  Wed  Am-168 Lunch -190 Dinner-148 Bedtime-167  Thurs  Am-170 Lunch- Dinner-215 Bedtime-129  Fri  Am-88

## 2018-12-12 ENCOUNTER — Telehealth: Payer: Self-pay

## 2018-12-12 DIAGNOSIS — E1065 Type 1 diabetes mellitus with hyperglycemia: Secondary | ICD-10-CM

## 2018-12-12 DIAGNOSIS — E782 Mixed hyperlipidemia: Secondary | ICD-10-CM

## 2018-12-12 NOTE — Telephone Encounter (Signed)
LeighAnn Marium Ragan, CMA  

## 2018-12-12 NOTE — Telephone Encounter (Signed)
She is aware of the recommendations, she has since started eating meat again and blood sugars are running better

## 2018-12-25 ENCOUNTER — Other Ambulatory Visit: Payer: Self-pay | Admitting: Internal Medicine

## 2019-02-14 DIAGNOSIS — Z1389 Encounter for screening for other disorder: Secondary | ICD-10-CM | POA: Diagnosis not present

## 2019-02-14 DIAGNOSIS — Z0000 Encounter for general adult medical examination without abnormal findings: Secondary | ICD-10-CM | POA: Diagnosis not present

## 2019-02-14 DIAGNOSIS — Z681 Body mass index (BMI) 19 or less, adult: Secondary | ICD-10-CM | POA: Diagnosis not present

## 2019-02-26 ENCOUNTER — Telehealth: Payer: Self-pay | Admitting: "Endocrinology

## 2019-02-26 NOTE — Telephone Encounter (Signed)
Pt notified to call when she feels like she can go out and have labs done.

## 2019-02-26 NOTE — Telephone Encounter (Signed)
Pt would like to know can she wait another month before doing labs and office visit with Dr Dorris Fetch due to [facility]

## 2019-03-06 ENCOUNTER — Ambulatory Visit: Payer: Medicare Other | Admitting: "Endocrinology

## 2019-03-10 ENCOUNTER — Other Ambulatory Visit (INDEPENDENT_AMBULATORY_CARE_PROVIDER_SITE_OTHER): Payer: Self-pay | Admitting: Internal Medicine

## 2019-03-29 ENCOUNTER — Telehealth: Payer: Self-pay | Admitting: "Endocrinology

## 2019-04-17 DIAGNOSIS — E1065 Type 1 diabetes mellitus with hyperglycemia: Secondary | ICD-10-CM | POA: Diagnosis not present

## 2019-04-17 DIAGNOSIS — E782 Mixed hyperlipidemia: Secondary | ICD-10-CM | POA: Diagnosis not present

## 2019-04-17 NOTE — Telephone Encounter (Signed)
Patient called and said her LEVEMIR FLEXTOUCH 100 UNIT/ML Pen  Was denied due to not coming in for an appt. She said that she has not felt comfortable going out to get her labs and wants to know what she should do.Marland Kitchen

## 2019-04-17 NOTE — Telephone Encounter (Signed)
Pt going to get labs today

## 2019-04-18 ENCOUNTER — Telehealth: Payer: Self-pay | Admitting: "Endocrinology

## 2019-04-18 LAB — COMPLETE METABOLIC PANEL WITHOUT GFR
AG Ratio: 1.4 (calc) (ref 1.0–2.5)
ALT: 11 U/L (ref 6–29)
AST: 13 U/L (ref 10–35)
Albumin: 4 g/dL (ref 3.6–5.1)
Alkaline phosphatase (APISO): 53 U/L (ref 37–153)
BUN/Creatinine Ratio: 28 (calc) — ABNORMAL HIGH (ref 6–22)
BUN: 27 mg/dL — ABNORMAL HIGH (ref 7–25)
CO2: 25 mmol/L (ref 20–32)
Calcium: 10 mg/dL (ref 8.6–10.4)
Chloride: 105 mmol/L (ref 98–110)
Creat: 0.95 mg/dL — ABNORMAL HIGH (ref 0.60–0.93)
GFR, Est African American: 69 mL/min/1.73m2
GFR, Est Non African American: 59 mL/min/1.73m2 — ABNORMAL LOW
Globulin: 2.9 g/dL (ref 1.9–3.7)
Glucose, Bld: 266 mg/dL — ABNORMAL HIGH (ref 65–99)
Potassium: 4.5 mmol/L (ref 3.5–5.3)
Sodium: 140 mmol/L (ref 135–146)
Total Bilirubin: 0.6 mg/dL (ref 0.2–1.2)
Total Protein: 6.9 g/dL (ref 6.1–8.1)

## 2019-04-18 LAB — HEMOGLOBIN A1C
Hgb A1c MFr Bld: 8.3 % of total Hgb — ABNORMAL HIGH (ref <5.7)
Mean Plasma Glucose: 192 (calc)
eAG (mmol/L): 10.6 (calc)

## 2019-04-18 MED ORDER — NOVOLOG FLEXPEN 100 UNIT/ML ~~LOC~~ SOPN: PEN_INJECTOR | SUBCUTANEOUS | 2 refills | Status: DC

## 2019-04-18 MED ORDER — LEVEMIR FLEXTOUCH 100 UNIT/ML ~~LOC~~ SOPN: PEN_INJECTOR | SUBCUTANEOUS | 2 refills | Status: DC

## 2019-04-18 NOTE — Telephone Encounter (Signed)
Yes refill, she can do phone visit with me on her appointment day.

## 2019-04-18 NOTE — Telephone Encounter (Signed)
Pt.notified

## 2019-04-18 NOTE — Telephone Encounter (Signed)
Pts labs are back and she is asking can we send in a refill. She has appt scheduled also and is out of insulin.

## 2019-04-27 ENCOUNTER — Ambulatory Visit (INDEPENDENT_AMBULATORY_CARE_PROVIDER_SITE_OTHER): Payer: Medicare Other | Admitting: "Endocrinology

## 2019-04-27 ENCOUNTER — Encounter: Payer: Self-pay | Admitting: "Endocrinology

## 2019-04-27 ENCOUNTER — Other Ambulatory Visit: Payer: Self-pay

## 2019-04-27 DIAGNOSIS — I10 Essential (primary) hypertension: Secondary | ICD-10-CM

## 2019-04-27 DIAGNOSIS — E782 Mixed hyperlipidemia: Secondary | ICD-10-CM | POA: Diagnosis not present

## 2019-04-27 DIAGNOSIS — E1065 Type 1 diabetes mellitus with hyperglycemia: Secondary | ICD-10-CM | POA: Diagnosis not present

## 2019-04-27 DIAGNOSIS — I6529 Occlusion and stenosis of unspecified carotid artery: Secondary | ICD-10-CM

## 2019-04-27 NOTE — Progress Notes (Signed)
04/27/2019                                                    Endocrinology Telehealth Visit Follow up Note -During COVID -19 Pandemic  This visit type was conducted due to national recommendations for restrictions regarding the COVID-19 Pandemic  in an effort to limit this patient's exposure and mitigate transmission of the corona virus.  Due to her co-morbid illnesses, Nancy Sutton is at  moderate to high risk for complications without adequate follow up.  This format is felt to be most appropriate for her at this time.  I connected with this patient on 04/27/2019   by telephone and verified that I am speaking with the correct person using two identifiers. Nancy Sutton, [birth year]. she has verbally consented to this visit. All issues noted in this document were discussed and addressed. The format was not optimal for physical exam.    Subjective:    Patient ID: Nancy Sutton, female    DOB: [birth year], PCP Elfredia NevinsFusco, Lawrence, MD   Past Medical History:  Diagnosis Date  . Arthritis   . Diabetes mellitus    since 2008  . Essential hypertension, benign 09/10/2015  . GERD (gastroesophageal reflux disease)   . Hypercholesteremia   . Hyperlipidemia   . Stroke (HCC) 03/2011   only deficits-affected eyesight   Past Surgical History:  Procedure Laterality Date  . CATARACT EXTRACTION W/PHACO  11/01/2011   Procedure: CATARACT EXTRACTION PHACO AND INTRAOCULAR LENS PLACEMENT (IOC);  Surgeon: Gemma PayorKerry Hunt, MD;  Location: [facility];  Service: Ophthalmology;  Laterality: Right;  CDE 12.48  . ESOPHAGEAL DILATION N/A 04/29/2017   Procedure: ESOPHAGEAL DILATION;  Surgeon: Malissa Hippoehman, Najeeb U, MD;  Location: [facility];  Service: Endoscopy;  Laterality: N/A;  . ESOPHAGOGASTRODUODENOSCOPY N/A 04/29/2017   Procedure: ESOPHAGOGASTRODUODENOSCOPY (EGD);  Surgeon: Malissa Hippoehman, Najeeb U, MD;  Location: [facility];  Service: Endoscopy;  Laterality: N/A;  1015  . ESOPHAGOGASTRODUODENOSCOPY (EGD) WITH ESOPHAGEAL DILATION N/A  02/14/2013   Procedure: ESOPHAGOGASTRODUODENOSCOPY (EGD) WITH ESOPHAGEAL DILATION;  Surgeon: Malissa HippoNajeeb U Rehman, MD;  Location: [facility];  Service: Endoscopy;  Laterality: N/A;  200-moved to 255 Ann to notify pt  . EYE SURGERY  10-2010   left eye removal of cataract  . TUBAL LIGATION  1984   Social History   Socioeconomic History  . Marital status: Married    Spouse name: wayne  . Number of children: 2  . Years of education: 3312  . Highest education level: Not on file  Occupational History  . 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 Financial resource strain: Not on file  . Food insecurity    Worry: Not on file    Inability: Not on file  . Transportation needs    Medical: Not on file    Non-medical: Not on file  Tobacco Use  . Smoking status: Never Smoker  . Smokeless tobacco: Never Used  Substance and Sexual Activity  . Alcohol use: No    Alcohol/week: 0.0 standard drinks  . Drug use: No  . Sexual activity: Yes    Birth control/protection: None  Lifestyle  . Physical activity    Days per week: Not on file    Minutes per session: Not on file  . Stress:  Not on file  Relationships  . Social Musicianconnections    Talks on phone: Not on file    Gets together: Not on file    Attends religious service: Not on file    Active member of club or organization: Not on file    Attends meetings of clubs or organizations: Not on file    Relationship status: Not on file  Other Topics Concern  . Not on file  Social History Narrative   Patient is married with 2 children.   Patient is right handed.   Patient has hs education.   Patient drinks 3-5 glasses of tea daily.   Outpatient Encounter Medications as of 04/27/2019  Medication Sig  . brimonidine (ALPHAGAN) 0.2 % ophthalmic solution Place 1 drop into both eyes 3 (three) times daily.   . calcium carbonate (TUMS - DOSED IN MG ELEMENTAL CALCIUM) 500 MG chewable tablet Chew 1 tablet by mouth  at bedtime as needed for indigestion or heartburn.  . clopidogrel (PLAVIX) 75 MG tablet Take 1 tablet (75 mg total) by mouth daily.  . dorzolamide-timolol (COSOPT) 22.3-6.8 MG/ML ophthalmic solution Place 1 drop into both eyes 2 (two) times daily.  Marland Kitchen. GLOBAL EASE INJECT PEN NEEDLES 32G X 4 MM MISC USE 4 PENTIPS EVERY DAY  . GLOBAL INJECT EASE LANCETS 30G MISC DIABETIC CLUB  . glucose blood (TRUE METRIX BLOOD GLUCOSE TEST) test strip Use as instructed 4 x daily  . insulin aspart (NOVOLOG FLEXPEN) 100 UNIT/ML FlexPen INJECT 12 - 18 UNITS INTO THE SKIN THREE TIMES DAILY WITH MEALS (DO NOT INJECT IF PREMEAL GLUCOSE IS BELOW 70)  . Insulin Detemir (LEVEMIR FLEXTOUCH) 100 UNIT/ML Pen INJECT 50 UNITS INTO THE SKIN AT BEDTIME  . l-methylfolate-B6-B12 (METANX) 3-35-2 MG TABS Take 1 tablet by mouth 2 (two) times daily.  Marland Kitchen. latanoprost (XALATAN) 0.005 % ophthalmic solution Place 1 drop into the right eye at bedtime.  Marland Kitchen. losartan (COZAAR) 25 MG tablet Take 25 mg by mouth daily.   . Menthol-Methyl Salicylate (MUSCLE RUB EX) Apply 1 application topically daily as needed (arthritis). Armeniahina Gel  . Multiple Vitamin (ONE-A-DAY 55 PLUS PO) Take 1 tablet by mouth daily.   . naproxen sodium (ANAPROX) 220 MG tablet Take 220 mg by mouth daily as needed (pain).  . pantoprazole (PROTONIX) 40 MG tablet TAKE 1 TABLET BY MOUTH TWICE DAILY  . simvastatin (ZOCOR) 80 MG tablet Take 80 mg by mouth every evening.   . vitamin C (ASCORBIC ACID) 500 MG tablet Take 500 mg by mouth daily.  . vitamin E (VITAMIN E) 400 UNIT capsule Take 400 Units by mouth daily.   No facility-administered encounter medications on file as of 04/27/2019.    ALLERGIES: Allergies  Allergen Reactions  . Ampicillin Hives    Breaks out in bumps Has patient had a PCN reaction causing immediate rash, facial/tongue/throat swelling, SOB or lightheadedness with hypotension: No Has patient had a PCN reaction causing severe rash involving mucus membranes or skin  necrosis: Yes Has patient had a PCN reaction that required hospitalization: No Has patient had a PCN reaction occurring within the last 10 years: No If all of the above answers are "NO", then may proceed with Cephalosporin use.   Therese Sarah. Bromday [Bromfenac Sodium] Itching and Swelling  . Lisinopril Cough  . Tetanus Toxoids Other (See Comments)    Breaks out in bumps   VACCINATION STATUS: Immunization History  Administered Date(s) Administered  . Rabies, IM 02/13/2014, 02/16/2014, 02/20/2014, 02/27/2014    Diabetes She  presents for her follow-up diabetic visit. She has type 1 diabetes mellitus. Onset time: She was diagnosed at approximate age of 69 years. Her disease course has been improving. There are no hypoglycemic associated symptoms. Pertinent negatives for hypoglycemia include no confusion, headaches, pallor or seizures. Associated symptoms include blurred vision. Pertinent negatives for diabetes include no chest pain, no polydipsia, no polyphagia and no polyuria. There are no hypoglycemic complications. Symptoms are improving. Diabetic complications include a CVA. Current diabetic treatment includes intensive insulin program. She is compliant with treatment some of the time. She is following a generally unhealthy diet. When asked about meal planning, she reported none. She has had a previous visit with a dietitian. She never participates in exercise. Her home blood glucose trend is decreasing steadily. Her breakfast blood glucose range is generally 140-180 mg/dl. Her lunch blood glucose range is generally 140-180 mg/dl. Her dinner blood glucose range is generally 140-180 mg/dl. Her bedtime blood glucose range is generally 140-180 mg/dl. Her overall blood glucose range is 140-180 mg/dl. An ACE inhibitor/angiotensin II receptor blocker is being taken. Eye exam is current (She does have a visual deficit from her prior CVA.).  Hyperlipidemia This is a chronic problem. The current episode started more  than 1 year ago. Exacerbating diseases include diabetes. Pertinent negatives include no chest pain, myalgias or shortness of breath. Current antihyperlipidemic treatment includes statins. Risk factors for coronary artery disease include diabetes mellitus, dyslipidemia, hypertension and a sedentary lifestyle.  Hypertension This is a chronic problem. The current episode started more than 1 year ago. Associated symptoms include blurred vision. Pertinent negatives include no chest pain, headaches, palpitations or shortness of breath. Risk factors for coronary artery disease include dyslipidemia, diabetes mellitus, sedentary lifestyle and post-menopausal state. Past treatments include ACE inhibitors. Hypertensive end-organ damage includes CVA.    Review of Systems  Constitutional: Negative for chills, fever and unexpected weight change.  HENT: Negative for trouble swallowing and voice change.   Eyes: Positive for blurred vision. Negative for visual disturbance.  Respiratory: Negative for cough, shortness of breath and wheezing.   Cardiovascular: Negative for chest pain, palpitations and leg swelling.  Gastrointestinal: Negative for diarrhea, nausea and vomiting.  Endocrine: Negative for cold intolerance, heat intolerance, polydipsia, polyphagia and polyuria.  Musculoskeletal: Negative for arthralgias and myalgias.  Skin: Negative for color change, pallor, rash and wound.  Neurological: Negative for seizures and headaches.  Psychiatric/Behavioral: Negative for confusion and suicidal ideas.    Objective:    There were no vitals taken for this visit.  Wt Readings from Last 3 Encounters:  09/05/18 138 lb 12.8 oz (63 kg)  08/30/18 138 lb (62.6 kg)  04/25/18 140 lb (63.5 kg)      Lab Results  Component Value Date   WBC 10.9 (H) 02/13/2014   HGB 11.3 (L) 02/13/2014   HCT 33.4 (L) 02/13/2014   MCV 88.8 02/13/2014   PLT 225 02/13/2014   Chemistry (most recent): Lab Results  Component Value  Date   NA 140 04/17/2019   K 4.5 04/17/2019   CL 105 04/17/2019   CO2 25 04/17/2019   BUN 27 (H) 04/17/2019   CREATININE 0.95 (H) 04/17/2019   Diabetic Labs (most recent): Lab Results  Component Value Date   HGBA1C 8.3 (H) 04/17/2019   HGBA1C 9.0 (H) 11/29/2018   HGBA1C 8.1 (H) 08/23/2018   Lipid Panel     Component Value Date/Time   CHOL 260 (H) 10/11/2016 1135   TRIG 98 10/11/2016 1135   HDL  96 10/11/2016 1135   CHOLHDL 2.7 10/11/2016 1135   VLDL 20 10/11/2016 1135   LDLCALC 144 (H) 10/11/2016 1135     Assessment & Plan:   1. 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 These are all discussed in detail with the patient.  -Ms. Cieslewicz reports improved glycemic profile and A1c of 8.3% improving from 9%.      - she is still unable to avoid simple carbohydrates like ice cream, saying that " it doesn't affect her glucose readings".  Glucose logs and insulin administration records pertaining to this visit,  to be scanned into patient's records.  Recent labs reviewed.  - I have re-counseled the patient on diet management   by adopting a carbohydrate restricted / protein rich  Diet.  - she  admits there is a room for improvement in her diet and drink choices. -  Suggestion is made for her to avoid simple carbohydrates  from her diet including Cakes, Sweet Desserts / Pastries, Ice Cream, Soda (diet and regular), Sweet Tea, Candies, Chips, Cookies, Sweet Pastries,  Store Bought Juices, Alcohol in Excess of  1-2 drinks a day, Artificial Sweeteners, Coffee Creamer, and "Sugar-free" Products. This will help patient to have stable blood glucose profile and potentially avoid unintended weight gain.  - Patient is advised to stick to a routine mealtimes to eat 3 meals  a day and  avoid unnecessary snacks ( to snack only to correct hypoglycemia).  - I have approached patient with the following individualized plan to manage diabetes and patient agrees.  - She has visual limitation and inconsistent meal pattern.  -She is advised to continue Levemir 40 units nightly, advised to increase her NovoLog to 12 units 3 times a day with meals for pre-meal with blood glucose readings above 70 mg/dl, plus  correction for readings above 150 mg/dl  Associated with strict monitoring of blood glucose 4 times a day-before meals and at bedtime.    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 -Patient is not a candidate for metformin,SGLT2 inhibitors, and incretin therapy. -Proper use of insulin is exclusive choice she has to treat diabetes to target.  - Patient specific target  for A1c; LDL, HDL, Triglycerides, and  Waist Circumference were discussed in detail.  2) BP/HTN: she is advised to home monitor blood pressure and report if > 140/90 on 2 separate readings.   She has tolerated losartan better since she was switched from lisinopril due to dry cough.    3) Lipids/HPL: Lately, she has been consistent taking her atorvastatin.  Her last LDL was 144.  She will continue atorvastatin 40 mg nightly.  She will need fasting lipid panel on subsequent visits.   4)  Weight/Diet: CDE consult in progress, exercise, and carbohydrates information provided.  5) Chronic Care/Health Maintenance:  -Patient is on ACEI/ARB and Statin medications and encouraged to continue to follow up with Ophthalmology, Podiatrist at least yearly or according to recommendations, and advised to  stay away from smoking. I have recommended yearly flu vaccine and pneumonia vaccination at least every 5 years; moderate intensity exercise for up to 150 minutes weekly;  and  sleep for  at least 7 hours a day.  - I advised patient to maintain close follow up with Elfredia NevinsFusco, Lawrence, MD for primary care needs.  - Patient Care Time Today:  25 min, of which >50% was spent in  counseling and the rest reviewing her  current and  previous labs/studies, previous treatments, her blood glucose readings, and medications' doses and developing a plan for long-term care based on the latest recommendations for standards of care.   Nancy Sutton participated in the discussions, expressed understanding, and voiced agreement with the above plans.  All questions were answered to her satisfaction. she is encouraged to contact clinic should she have any questions or concerns prior to her return visit.  Follow up plan: -Return in about 3 months (around 07/28/2019), or logs-8, for Follow up with Pre-visit Labs, Meter, and Logs.  Marquis LunchGebre Moishe Schellenberg, MD Phone: [phone]  Fax: [phone]   This note was partially dictated with voice recognition software. Similar sounding words can be transcribed inadequately or may not  be corrected upon review.  04/27/2019, 12:25 PM

## 2019-06-15 ENCOUNTER — Other Ambulatory Visit: Payer: Self-pay | Admitting: Internal Medicine

## 2019-06-15 ENCOUNTER — Other Ambulatory Visit: Payer: Self-pay

## 2019-06-15 ENCOUNTER — Ambulatory Visit
Admission: RE | Admit: 2019-06-15 | Discharge: 2019-06-15 | Disposition: A | Discharge status: Home / Self Care | Payer: Medicare Other | Source: Ambulatory Visit | Attending: Internal Medicine | Admitting: Internal Medicine

## 2019-06-15 DIAGNOSIS — R921 Mammographic calcification found on diagnostic imaging of breast: Secondary | ICD-10-CM

## 2019-06-20 DIAGNOSIS — I6529 Occlusion and stenosis of unspecified carotid artery: Secondary | ICD-10-CM | POA: Diagnosis not present

## 2019-06-27 DIAGNOSIS — E103293 Type 1 diabetes mellitus with mild nonproliferative diabetic retinopathy without macular edema, bilateral: Secondary | ICD-10-CM | POA: Diagnosis not present

## 2019-06-27 DIAGNOSIS — H401122 Primary open-angle glaucoma, left eye, moderate stage: Secondary | ICD-10-CM | POA: Diagnosis not present

## 2019-06-27 DIAGNOSIS — H47293 Other optic atrophy, bilateral: Secondary | ICD-10-CM | POA: Diagnosis not present

## 2019-06-27 DIAGNOSIS — H401112 Primary open-angle glaucoma, right eye, moderate stage: Secondary | ICD-10-CM | POA: Diagnosis not present

## 2019-07-20 DIAGNOSIS — E1065 Type 1 diabetes mellitus with hyperglycemia: Secondary | ICD-10-CM | POA: Diagnosis not present

## 2019-07-21 LAB — COMPLETE METABOLIC PANEL WITH GFR
AG Ratio: 1.4 (calc) (ref 1.0–2.5)
ALT: 12 U/L (ref 6–29)
AST: 13 U/L (ref 10–35)
Albumin: 4.1 g/dL (ref 3.6–5.1)
Alkaline phosphatase (APISO): 61 U/L (ref 37–153)
BUN: 25 mg/dL (ref 7–25)
CO2: 27 mmol/L (ref 20–32)
Calcium: 9.6 mg/dL (ref 8.6–10.4)
Chloride: 107 mmol/L (ref 98–110)
Creat: 0.82 mg/dL (ref 0.60–0.93)
GFR, Est African American: 82 mL/min/{1.73_m2} (ref >=60)
GFR, Est Non African American: 71 mL/min/{1.73_m2} (ref >=60)
Globulin: 2.9 g/dL (calc) (ref 1.9–3.7)
Glucose, Bld: 110 mg/dL — ABNORMAL HIGH (ref 65–99)
Potassium: 4.1 mmol/L (ref 3.5–5.3)
Sodium: 141 mmol/L (ref 135–146)
Total Bilirubin: 0.5 mg/dL (ref 0.2–1.2)
Total Protein: 7 g/dL (ref 6.1–8.1)

## 2019-07-21 LAB — HEMOGLOBIN A1C
Hgb A1c MFr Bld: 8 % of total Hgb — ABNORMAL HIGH (ref <5.7)
Mean Plasma Glucose: 183 (calc)
eAG (mmol/L): 10.1 (calc)

## 2019-08-02 ENCOUNTER — Telehealth: Payer: Self-pay | Admitting: "Endocrinology

## 2019-08-02 ENCOUNTER — Ambulatory Visit (INDEPENDENT_AMBULATORY_CARE_PROVIDER_SITE_OTHER): Payer: Medicare Other | Admitting: "Endocrinology

## 2019-08-02 ENCOUNTER — Other Ambulatory Visit: Payer: Self-pay

## 2019-08-02 ENCOUNTER — Encounter: Payer: Self-pay | Admitting: "Endocrinology

## 2019-08-02 DIAGNOSIS — E782 Mixed hyperlipidemia: Secondary | ICD-10-CM

## 2019-08-02 DIAGNOSIS — I6529 Occlusion and stenosis of unspecified carotid artery: Secondary | ICD-10-CM | POA: Diagnosis not present

## 2019-08-02 DIAGNOSIS — E1065 Type 1 diabetes mellitus with hyperglycemia: Secondary | ICD-10-CM | POA: Diagnosis not present

## 2019-08-02 DIAGNOSIS — I10 Essential (primary) hypertension: Secondary | ICD-10-CM

## 2019-08-02 MED ORDER — DEXCOM G6 TRANSMITTER MISC: 1.0000 | 0 refills | Status: DC

## 2019-08-02 MED ORDER — DEXCOM G6 SENSOR MISC: 1.0000 | 0 refills | Status: DC

## 2019-08-02 MED ORDER — DEXCOM G6 RECEIVER DEVI: 1.0000 | Freq: Four times a day (QID) | 0 refills | Status: DC

## 2019-08-02 NOTE — Progress Notes (Signed)
08/02/2019                                                    Endocrinology Telehealth Visit Follow up Note -During COVID -19 Pandemic  This visit type was conducted due to national recommendations for restrictions regarding the COVID-19 Pandemic  in an effort to limit this patient's exposure and mitigate transmission of the corona virus.  Due to her co-morbid illnesses, ZI SEK is at  moderate to high risk for complications without adequate follow up.  This format is felt to be most appropriate for her at this time.  I connected with this patient on 08/02/2019   by telephone and verified that I am speaking with the correct person using two identifiers. Nancy Sutton, March 13, 1946. she has verbally consented to this visit. All issues noted in this document were discussed and addressed. The format was not optimal for physical exam.    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 Arthritis   . Diabetes mellitus    since 2008  . Essential hypertension, benign 09/10/2015  . GERD (gastroesophageal reflux disease)   . Hypercholesteremia   . Hyperlipidemia   . Stroke ([place]) 03/2011   only deficits-affected eyesight   Past Surgical History:  Procedure Laterality Date  . CATARACT EXTRACTION W/PHACO  11/01/2011   Procedure: CATARACT EXTRACTION PHACO AND INTRAOCULAR LENS PLACEMENT (IOC);  Surgeon: Tonny Branch, MD;  Location: [facility];  Service: Ophthalmology;  Laterality: Right;  CDE 12.48  . ESOPHAGEAL DILATION N/A 04/29/2017   Procedure: ESOPHAGEAL DILATION;  Surgeon: Rogene Houston, MD;  Location: [facility];  Service: Endoscopy;  Laterality: N/A;  . ESOPHAGOGASTRODUODENOSCOPY N/A 04/29/2017   Procedure: ESOPHAGOGASTRODUODENOSCOPY (EGD);  Surgeon: Rogene Houston, MD;  Location: [facility];  Service: Endoscopy;  Laterality: N/A;  1015  . ESOPHAGOGASTRODUODENOSCOPY (EGD) WITH ESOPHAGEAL DILATION  N/A 02/14/2013   Procedure: ESOPHAGOGASTRODUODENOSCOPY (EGD) WITH ESOPHAGEAL DILATION;  Surgeon: Rogene Houston, MD;  Location: [facility];  Service: Endoscopy;  Laterality: N/A;  200-moved to 255 Ann to notify pt  . EYE SURGERY  10-2010   left eye removal of cataract  . TUBAL LIGATION  1984   Social History   Socioeconomic History  . Marital status: Married    Spouse name: wayne  . Number of children: 2  . Years of education: 23  . Highest education level: Not on file  Occupational History  . 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 Financial resource strain: Not on file  . Food insecurity    Worry: Not on file    Inability: Not on file  . Transportation needs    Medical: Not on file    Non-medical: Not on file  Tobacco Use  . Smoking status: Never Smoker  . Smokeless tobacco: Never Used  Substance and Sexual Activity  . Alcohol use: No    Alcohol/week: 0.0 standard drinks  . Drug use: No  . Sexual activity: Yes    Birth control/protection: None  Lifestyle  . Physical activity    Days per week: Not on file    Minutes per session: Not on file  . Stress:  Not on file  Relationships  . Social connections    Talks on phone: Not on file    Gets together: NotMusician on file    Attends religious service: Not on file    Active member of club or organization: Not on file    Attends meetings of clubs or organizations: Not on file    Relationship status: Not on file  Other Topics Concern  . Not on file  Social History Narrative   Patient is married with 2 children.   Patient is right handed.   Patient has hs education.   Patient drinks 3-5 glasses of tea daily.   Outpatient Encounter Medications as of 08/02/2019  Medication Sig  . Insulin Detemir (LEVEMIR) 100 UNIT/ML Pen Inject 44 Units into the skin daily.  . brimonidine (ALPHAGAN) 0.2 % ophthalmic solution Place 1 drop into both eyes 3 (three) times daily.   . calcium  carbonate (TUMS - DOSED IN MG ELEMENTAL CALCIUM) 500 MG chewable tablet Chew 1 tablet by mouth at bedtime as needed for indigestion or heartburn.  . clopidogrel (PLAVIX) 75 MG tablet Take 1 tablet (75 mg total) by mouth daily.  . dorzolamide-timolol (COSOPT) 22.3-6.8 MG/ML ophthalmic solution Place 1 drop into both eyes 2 (two) times daily.  Marland Kitchen. GLOBAL EASE INJECT PEN NEEDLES 32G X 4 MM MISC USE 4 PENTIPS EVERY DAY  . GLOBAL INJECT EASE LANCETS 30G MISC DIABETIC CLUB  . glucose blood (TRUE METRIX BLOOD GLUCOSE TEST) test strip Use as instructed 4 x daily  . insulin aspart (NOVOLOG FLEXPEN) 100 UNIT/ML FlexPen INJECT 12 - 18 UNITS INTO THE SKIN THREE TIMES DAILY WITH MEALS (DO NOT INJECT IF PREMEAL GLUCOSE IS BELOW 70)  . l-methylfolate-B6-B12 (METANX) 3-35-2 MG TABS Take 1 tablet by mouth 2 (two) times daily.  Marland Kitchen. latanoprost (XALATAN) 0.005 % ophthalmic solution Place 1 drop into the right eye at bedtime.  Marland Kitchen. losartan (COZAAR) 25 MG tablet Take 25 mg by mouth daily.   . Menthol-Methyl Salicylate (MUSCLE RUB EX) Apply 1 application topically daily as needed (arthritis). [place] Gel  . Multiple Vitamin (ONE-A-DAY 55 PLUS PO) Take 1 tablet by mouth daily.   . naproxen sodium (ANAPROX) 220 MG tablet Take 220 mg by mouth daily as needed (pain).  . pantoprazole (PROTONIX) 40 MG tablet TAKE 1 TABLET BY MOUTH TWICE DAILY  . simvastatin (ZOCOR) 80 MG tablet Take 80 mg by mouth every evening.   . vitamin C (ASCORBIC ACID) 500 MG tablet Take 500 mg by mouth daily.  . vitamin E (VITAMIN E) 400 UNIT capsule Take 400 Units by mouth daily.  . [DISCONTINUED] Insulin Detemir (LEVEMIR FLEXTOUCH) 100 UNIT/ML Pen INJECT 50 UNITS INTO THE SKIN AT BEDTIME   No facility-administered encounter medications on file as of 08/02/2019.    ALLERGIES: Allergies  Allergen Reactions  . Ampicillin Hives    Breaks out in bumps Has patient had a PCN reaction causing immediate rash, facial/tongue/throat swelling, SOB or  lightheadedness with hypotension: No Has patient had a PCN reaction causing severe rash involving mucus membranes or skin necrosis: Yes Has patient had a PCN reaction that required hospitalization: No Has patient had a PCN reaction occurring within the last 10 years: No If all of the above answers are "NO", then may proceed with Cephalosporin use.   Therese Sarah. Bromday [Bromfenac Sodium] Itching and Swelling  . Lisinopril Cough  . Tetanus Toxoids Other (See Comments)    Breaks out in bumps   VACCINATION STATUS: Immunization History  Administered Date(s) Administered  . Rabies, IM 02/13/2014, 02/16/2014, 02/20/2014, 02/27/2014    Diabetes She presents for her follow-up diabetic visit. She has type 1 diabetes mellitus. Onset time: She was diagnosed at approximate age of 87 years. Her disease course has been improving. There are no hypoglycemic associated symptoms. Pertinent negatives for hypoglycemia include no confusion, headaches, pallor or seizures. Associated symptoms include blurred vision. Pertinent negatives for diabetes include no chest pain, no polydipsia, no polyphagia and no polyuria. There are no hypoglycemic complications. Symptoms are improving. Diabetic complications include a CVA. Current diabetic treatment includes intensive insulin program. She is compliant with treatment some of the time. She is following a generally unhealthy diet. When asked about meal planning, she reported none. She has had a previous visit with a dietitian. She never participates in exercise. Her home blood glucose trend is decreasing steadily. Her breakfast blood glucose range is generally 140-180 mg/dl. Her lunch blood glucose range is generally 140-180 mg/dl. Her dinner blood glucose range is generally 140-180 mg/dl. Her bedtime blood glucose range is generally 140-180 mg/dl. Her overall blood glucose range is 140-180 mg/dl. An ACE inhibitor/angiotensin II receptor blocker is being taken. Eye exam is current (She  does have a visual deficit from her prior CVA.).  Hyperlipidemia This is a chronic problem. The current episode started more than 1 year ago. Exacerbating diseases include diabetes. Pertinent negatives include no chest pain, myalgias or shortness of breath. Current antihyperlipidemic treatment includes statins. Risk factors for coronary artery disease include diabetes mellitus, dyslipidemia, hypertension and a sedentary lifestyle.  Hypertension This is a chronic problem. The current episode started more than 1 year ago. Associated symptoms include blurred vision. Pertinent negatives include no chest pain, headaches, palpitations or shortness of breath. Risk factors for coronary artery disease include dyslipidemia, diabetes mellitus, sedentary lifestyle and post-menopausal state. Past treatments include ACE inhibitors. Hypertensive end-organ damage includes CVA.    Review of systems: Limited as above.  Objective:    There were no vitals taken for this visit.  Wt Readings from Last 3 Encounters:  09/05/18 138 lb 12.8 oz (63 kg)  08/30/18 138 lb (62.6 kg)  04/25/18 140 lb (63.5 kg)      Lab Results  Component Value Date   WBC 10.9 (H) 02/13/2014   HGB 11.3 (L) 02/13/2014   HCT 33.4 (L) 02/13/2014   MCV 88.8 02/13/2014   PLT 225 02/13/2014   Chemistry (most recent): Lab Results  Component Value Date   NA 141 07/20/2019   K 4.1 07/20/2019   CL 107 07/20/2019   CO2 27 07/20/2019   BUN 25 07/20/2019   CREATININE 0.82 07/20/2019   Diabetic Labs (most recent): Lab Results  Component Value Date   HGBA1C 8.0 (H) 07/20/2019   HGBA1C 8.3 (H) 04/17/2019   HGBA1C 9.0 (H) 11/29/2018   Lipid Panel     Component Value Date/Time   CHOL 260 (H) 10/11/2016 1135   TRIG 98 10/11/2016 1135   HDL 96 10/11/2016 1135   CHOLHDL 2.7 10/11/2016 1135   VLDL 20 10/11/2016 1135   LDLCALC 144 (H) 10/11/2016 1135     Assessment & Plan:   1. 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 These are all discussed in detail with the patient.  -Ms. Hoggard reports improved glycemic profile  and her previsit labs show A1c of 8% progressively improving from 9%.      - she is still unable to avoid simple carbohydrates like ice cream, saying that " it doesn't affect her glucose readings".  Glucose logs and insulin administration records pertaining to this visit,  to be scanned into patient's records.  Recent labs reviewed.  - I have re-counseled the patient on diet management   by adopting a carbohydrate restricted / protein rich  Diet.  - she  admits there is a room for improvement in her diet and drink choices. -  Suggestion is made for her to avoid simple carbohydrates  from her diet including Cakes, Sweet Desserts / Pastries, Ice Cream, Soda (diet and regular), Sweet Tea, Candies, Chips, Cookies, Sweet Pastries,  Store Bought Juices, Alcohol in Excess of  1-2 drinks a day, Artificial Sweeteners, Coffee Creamer, and "Sugar-free" Products. This will help patient to have stable blood glucose profile and potentially avoid unintended weight gain.   - Patient is advised to stick to a routine mealtimes to eat 3 meals  a day and avoid unnecessary snacks ( to snack only to correct hypoglycemia).  - I have approached patient with the following individualized plan to manage diabetes and patient agrees.  - She has visual limitation and inconsistent meal pattern.  -She is advised to increase Levemir to 44 units nightly , advised to increase her NovoLog to 12 units 3 times a day with meals for pre-meal with blood glucose readings above 70 mg/dl, plus  correction for readings above 150 mg/dl  Associated with strict monitoring of blood glucose 4 times a day-before meals and at bedtime.     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 -Patient is not a candidate for metformin,SGLT2 inhibitors, and incretin therapy. -Proper use of insulin is exclusive choice she has to treat diabetes to target. -She is interested in getting Dexcom CGM.  This is going to be a very helpful device for her, prescribed.  - Patient specific target  for A1c; LDL, HDL, Triglycerides, and  Waist Circumference were discussed in detail.  2) BP/HTN she is advised to home monitor blood pressure and report if > 140/90 on 2 separate readings.    She has tolerated losartan better since she was switched from lisinopril due to dry cough.    3) Lipids/HPL: Lately, she has been consistent taking her atorvastatin.  Her last LDL was 144.  She will continue atorvastatin 40 mg nightly.  She will need fasting lipid panel on subsequent visits.   4)  Weight/Diet: CDE consult in progress, exercise, and carbohydrates information provided.  5) Chronic Care/Health Maintenance:  -Patient is on ACEI/ARB and Statin medications and encouraged to continue to follow up with Ophthalmology, Podiatrist at least yearly or according to recommendations, and advised to  stay away from smoking. I have recommended yearly flu vaccine and pneumonia vaccination at least every 5 years; moderate intensity exercise for up to 150 minutes weekly; and  sleep for at least 7 hours a day.  - I advised patient to maintain close follow up with Elfredia Nevins, MD for primary care needs.  - Patient Care Time Today:  25 min, of which >50% was spent in  counseling and the rest reviewing her  current and  previous labs/studies, previous treatments, her blood glucose readings, and medications'  doses and developing a plan for long-term care based on the latest recommendations for standards of care.    Lenn Sink participated in the discussions, expressed understanding, and voiced agreement with the above plans.  All questions were answered to her satisfaction. she is encouraged to contact clinic should she have any questions or concerns prior to her return visit.   Follow up plan: -Return in about 4 months (around 11/30/2019) for Bring Meter and Logs- A1c in Office.  Marquis Lunch, MD Phone: [phone]  Fax: [phone]   This note was partially dictated with voice recognition software. Similar sounding words can be transcribed inadequately or may not  be corrected upon review.  08/02/2019, 1:44 PM

## 2019-08-02 NOTE — Telephone Encounter (Signed)
Yes, she is a good candidate if her insurance would cover it. I can send in a rx.

## 2019-08-02 NOTE — Telephone Encounter (Signed)
Patient would like to know could she try the dexcom G6.

## 2019-09-25 ENCOUNTER — Other Ambulatory Visit: Payer: Self-pay

## 2019-09-25 ENCOUNTER — Encounter (INDEPENDENT_AMBULATORY_CARE_PROVIDER_SITE_OTHER): Payer: Self-pay | Admitting: Internal Medicine

## 2019-09-25 ENCOUNTER — Ambulatory Visit (INDEPENDENT_AMBULATORY_CARE_PROVIDER_SITE_OTHER): Payer: Medicare Other | Admitting: Internal Medicine

## 2019-09-25 VITALS — Ht 59.0 in

## 2019-09-25 DIAGNOSIS — K2270 Barrett's esophagus without dysplasia: Secondary | ICD-10-CM | POA: Diagnosis not present

## 2019-09-25 DIAGNOSIS — K219 Gastro-esophageal reflux disease without esophagitis: Secondary | ICD-10-CM | POA: Diagnosis not present

## 2019-09-25 NOTE — Progress Notes (Signed)
Virtual Visit via Telephone Note  Patient had scheduled face-to-face visit today.  Visit was changed to virtual/telephone visit because of ongoing Covid-19 pandemic and we both agreed. I connected with DARLA MCDONALD on 09/25/19 at 10:42 AM EST by telephone and verified that I am speaking with the correct person using two identifiers.  Location: Patient: home Provider: office   I discussed the limitations, risks, security and privacy concerns of performing an evaluation and management service by telephone and the availability of in person appointments. I also discussed with the patient that there may be a patient responsible charge related to this service. The patient expressed understanding and agreed to proceed.   History of Present Illness:  Patient is 74 year old Caucasian female who has chronic GERD complicated by short segment Barrett's esophagus as well as esophageal stricture which was last manipulated/dilated in August 2018.  She also has small sliding hiatal hernia.  She says she is doing well.  She has heartburn only with certain foods that she should not be eating.  She takes 1 to 2 tablets of Tums per week.  She denies hoarseness chronic cough or sore throat.  She takes Aleve may be 3-4 times in a month for arthralgias.  She she tends to take it more often during winter season.  She denies abdominal pain melena or rectal bleeding.  Her bowels move daily.  She takes clopidogrel at bedtime.  She takes pantoprazole 30 minutes before breakfast and evening meal.  She said she eats supper between 5 and 6 PM and does not go to bed until midnight. Patient is not interested in any test for screening for CRC.  She has never been screened in the past other than Hemoccults.    Observations/Objective:  Patient reported her weight to be 138 pounds. She weighed 138 pounds on 08/26/2018.  Assessment and Plan:  #1. Chronic GERD complicated by short segment Barrett's esophagus and esophageal  stricture which was last manipulated in August 2018 when she also had esophageal biopsy confirming Barrett's epithelium without dysplasia.  She is doing well with double dose PPI.  She did not do well when dose was dropped to once a day.  Patient is also on clopidogrel which she takes late at night to minimize interaction.  #2.  Patient is average risk for CRC but she has declined screening in the past.  Screening options reviewed with the patient.  If she is interested in Cologuard she can call her insurance and find out if it is covered.  Follow Up Instructions:  Continue pantoprazole at 40 mg p.o. twice daily. Patient will call if she develops dysphagia. Office visit in 6 months.  I discussed the assessment and treatment plan with the patient. The patient was provided an opportunity to ask questions and all were answered. The patient agreed with the plan and demonstrated an understanding of the instructions.   The patient was advised to call back or seek an in-person evaluation if the symptoms worsen or if the condition fails to improve as anticipated.  I provided 12 minutes of non-face-to-face time during this encounter.   Lionel December, MD

## 2019-09-26 DIAGNOSIS — K2270 Barrett's esophagus without dysplasia: Secondary | ICD-10-CM | POA: Insufficient documentation

## 2019-11-05 DIAGNOSIS — E1129 Type 2 diabetes mellitus with other diabetic kidney complication: Secondary | ICD-10-CM | POA: Diagnosis not present

## 2019-11-05 DIAGNOSIS — I10 Essential (primary) hypertension: Secondary | ICD-10-CM | POA: Diagnosis not present

## 2019-11-05 DIAGNOSIS — Z1389 Encounter for screening for other disorder: Secondary | ICD-10-CM | POA: Diagnosis not present

## 2019-11-05 DIAGNOSIS — B351 Tinea unguium: Secondary | ICD-10-CM | POA: Diagnosis not present

## 2019-11-19 ENCOUNTER — Telehealth: Payer: Self-pay | Admitting: "Endocrinology

## 2019-11-19 NOTE — Telephone Encounter (Signed)
Discussed with pt the earliest date of levemir and novolog documented in our records.

## 2019-11-19 NOTE — Telephone Encounter (Signed)
Pt wants to know when she started on Novolin and Levimer. She said if she does not answer the phone when you call to leave the dates on her VM

## 2019-11-28 DIAGNOSIS — H401112 Primary open-angle glaucoma, right eye, moderate stage: Secondary | ICD-10-CM | POA: Diagnosis not present

## 2019-11-28 DIAGNOSIS — H47293 Other optic atrophy, bilateral: Secondary | ICD-10-CM | POA: Diagnosis not present

## 2019-11-28 DIAGNOSIS — H401122 Primary open-angle glaucoma, left eye, moderate stage: Secondary | ICD-10-CM | POA: Diagnosis not present

## 2019-11-28 DIAGNOSIS — E103293 Type 1 diabetes mellitus with mild nonproliferative diabetic retinopathy without macular edema, bilateral: Secondary | ICD-10-CM | POA: Diagnosis not present

## 2019-11-30 ENCOUNTER — Ambulatory Visit: Payer: Medicare Other | Admitting: "Endocrinology

## 2019-12-06 ENCOUNTER — Other Ambulatory Visit: Payer: Self-pay

## 2019-12-06 MED ORDER — INSULIN DETEMIR 100 UNIT/ML FLEXPEN: 44.0000 [IU] | PEN_INJECTOR | Freq: Every day | SUBCUTANEOUS | 0 refills | Status: DC

## 2020-01-18 DIAGNOSIS — E1122 Type 2 diabetes mellitus with diabetic chronic kidney disease: Secondary | ICD-10-CM | POA: Diagnosis not present

## 2020-01-18 DIAGNOSIS — N182 Chronic kidney disease, stage 2 (mild): Secondary | ICD-10-CM | POA: Diagnosis not present

## 2020-01-18 DIAGNOSIS — I129 Hypertensive chronic kidney disease with stage 1 through stage 4 chronic kidney disease, or unspecified chronic kidney disease: Secondary | ICD-10-CM | POA: Diagnosis not present

## 2020-01-18 DIAGNOSIS — E7849 Other hyperlipidemia: Secondary | ICD-10-CM | POA: Diagnosis not present

## 2020-01-22 ENCOUNTER — Other Ambulatory Visit: Payer: Self-pay

## 2020-01-22 ENCOUNTER — Ambulatory Visit
Admission: RE | Admit: 2020-01-22 | Discharge: 2020-01-22 | Disposition: A | Discharge status: Home / Self Care | Payer: Medicare Other | Source: Ambulatory Visit | Attending: Internal Medicine | Admitting: Internal Medicine

## 2020-01-22 DIAGNOSIS — R921 Mammographic calcification found on diagnostic imaging of breast: Secondary | ICD-10-CM | POA: Diagnosis not present

## 2020-02-11 DIAGNOSIS — E119 Type 2 diabetes mellitus without complications: Secondary | ICD-10-CM | POA: Diagnosis not present

## 2020-02-11 DIAGNOSIS — K219 Gastro-esophageal reflux disease without esophagitis: Secondary | ICD-10-CM | POA: Diagnosis not present

## 2020-02-11 DIAGNOSIS — Z0001 Encounter for general adult medical examination with abnormal findings: Secondary | ICD-10-CM | POA: Diagnosis not present

## 2020-02-11 DIAGNOSIS — Z1389 Encounter for screening for other disorder: Secondary | ICD-10-CM | POA: Diagnosis not present

## 2020-02-11 DIAGNOSIS — I10 Essential (primary) hypertension: Secondary | ICD-10-CM | POA: Diagnosis not present

## 2020-02-11 DIAGNOSIS — E039 Hypothyroidism, unspecified: Secondary | ICD-10-CM | POA: Diagnosis not present

## 2020-02-11 DIAGNOSIS — N182 Chronic kidney disease, stage 2 (mild): Secondary | ICD-10-CM | POA: Diagnosis not present

## 2020-02-11 DIAGNOSIS — E7849 Other hyperlipidemia: Secondary | ICD-10-CM | POA: Diagnosis not present

## 2020-03-28 DIAGNOSIS — H401112 Primary open-angle glaucoma, right eye, moderate stage: Secondary | ICD-10-CM | POA: Diagnosis not present

## 2020-03-28 DIAGNOSIS — E103293 Type 1 diabetes mellitus with mild nonproliferative diabetic retinopathy without macular edema, bilateral: Secondary | ICD-10-CM | POA: Diagnosis not present

## 2020-03-28 DIAGNOSIS — H47293 Other optic atrophy, bilateral: Secondary | ICD-10-CM | POA: Diagnosis not present

## 2020-03-28 DIAGNOSIS — H401122 Primary open-angle glaucoma, left eye, moderate stage: Secondary | ICD-10-CM | POA: Diagnosis not present

## 2020-04-01 ENCOUNTER — Encounter (INDEPENDENT_AMBULATORY_CARE_PROVIDER_SITE_OTHER): Payer: Self-pay | Admitting: Internal Medicine

## 2020-04-01 ENCOUNTER — Ambulatory Visit (INDEPENDENT_AMBULATORY_CARE_PROVIDER_SITE_OTHER): Payer: Medicare Other | Admitting: Internal Medicine

## 2020-04-01 ENCOUNTER — Other Ambulatory Visit: Payer: Self-pay

## 2020-04-01 VITALS — BP 137/71 | HR 82 | Temp 98.0°F | Ht 59.0 in | Wt 137.3 lb

## 2020-04-01 DIAGNOSIS — K222 Esophageal obstruction: Secondary | ICD-10-CM

## 2020-04-01 DIAGNOSIS — R1310 Dysphagia, unspecified: Secondary | ICD-10-CM | POA: Diagnosis not present

## 2020-04-01 DIAGNOSIS — K2100 Gastro-esophageal reflux disease with esophagitis, without bleeding: Secondary | ICD-10-CM

## 2020-04-01 DIAGNOSIS — R1319 Other dysphagia: Secondary | ICD-10-CM

## 2020-04-01 NOTE — Patient Instructions (Signed)
Please call if you experience swallowing difficulty or Pantoprazole stops working

## 2020-04-01 NOTE — Progress Notes (Signed)
Presenting complaint;  Follow-up for chronic GERD complicated by short segment Barrett's and esophageal stricture.  Database and subjective:  Patient is 74 year old Caucasian female who has chronic GERD complicated by distal esophageal stricture and short segment Barrett's esophagus.  Her last EGD with dilation and biopsy was in August 2018.  Barrett's epithelium was without dysplasia. She has been maintained on double dose PPI.  She has not done well with single dose PPI.  She was last seen 6 months ago. She is accompanied by her husband [place]. She says she is doing well.  She received both doses of Covid vaccine made by [facility] and did not experience any significant side effects. She says she has heartburn 2-3 times a week quickly relieved with Tums.  She denies dysphagia.  She also denies cough hoarseness or sore throat.  She says she is a slow eater.  She is watching her food.  Her appetite is good and her weight has been stable.  She takes Naprosyn on as-needed basis.  She states she takes it more often in winter than summer last dose was 1 week ago.  Patient states that every now and then she is skipping evening dose and seemed to do all right.  Current Medications: Outpatient Encounter Medications as of 04/01/2020  Medication Sig   brimonidine (ALPHAGAN) 0.2 % ophthalmic solution Place 1 drop into both eyes 3 (three) times daily.    calcium carbonate (TUMS - DOSED IN MG ELEMENTAL CALCIUM) 500 MG chewable tablet Chew 1 tablet by mouth at bedtime as needed for indigestion or heartburn.   clopidogrel (PLAVIX) 75 MG tablet Take 1 tablet (75 mg total) by mouth daily.   dorzolamide-timolol (COSOPT) 22.3-6.8 MG/ML ophthalmic solution Place 1 drop into both eyes 2 (two) times daily.   GLOBAL EASE INJECT PEN NEEDLES 32G X 4 MM MISC USE 4 PENTIPS EVERY DAY   GLOBAL INJECT EASE LANCETS 30G MISC DIABETIC CLUB   glucose blood (TRUE METRIX BLOOD GLUCOSE TEST) test strip Use as instructed 4 x  daily   insulin aspart (NOVOLOG FLEXPEN) 100 UNIT/ML FlexPen INJECT 12 - 18 UNITS INTO THE SKIN THREE TIMES DAILY WITH MEALS (DO NOT INJECT IF PREMEAL GLUCOSE IS BELOW 70)   insulin detemir (LEVEMIR) 100 UNIT/ML FlexPen Inject 44 Units into the skin at bedtime.   latanoprost (XALATAN) 0.005 % ophthalmic solution Place 1 drop into the right eye at bedtime.   losartan (COZAAR) 25 MG tablet Take 25 mg by mouth daily.    Multiple Vitamin (ONE-A-DAY 55 PLUS PO) Take 1 tablet by mouth daily.    naproxen sodium (ANAPROX) 220 MG tablet Take 220 mg by mouth daily as needed (pain).   pantoprazole (PROTONIX) 40 MG tablet TAKE 1 TABLET BY MOUTH TWICE DAILY   simvastatin (ZOCOR) 80 MG tablet Take 80 mg by mouth every evening.    vitamin C (ASCORBIC ACID) 500 MG tablet Take 500 mg by mouth daily.   Vitamin D, Cholecalciferol, 50 MCG (2000 UT) CAPS Take by mouth daily.   vitamin E (VITAMIN E) 400 UNIT capsule Take 400 Units by mouth daily.   Menthol-Methyl Salicylate (MUSCLE RUB EX) Apply 1 application topically daily as needed (arthritis). [place] Gel (Patient not taking: Reported on 04/01/2020)   No facility-administered encounter medications on file as of 04/01/2020.     Objective: Blood pressure 137/71, pulse 82, temperature 98 F (36.7 C), temperature source Oral, height 4\' 11"  (1.499 m), weight 137 lb 4.8 oz (62.3 kg). Patient is alert and in  no acute distress. She is wearing a mask. Conjunctiva is pink. Sclera is nonicteric Oropharyngeal mucosa is normal. No neck masses or thyromegaly noted. Cardiac exam with regular rhythm normal S1 and S2. No murmur or gallop noted. Lungs are clear to auscultation. Abdomen is symmetrical.  She has ecchymoses across lower abdomen secondary to insulin injections.  Abdomen is soft and nontender with organomegaly or masses. No LE edema or clubbing noted.   Assessment:  Chronic GERD complicated by short segment Barrett's esophagus and esophageal  stricture which was last dilated in August 2018 and she remains without dysphagia.  Symptom control is fair.  She is still having to use x2-3 times a week.  Therefore will not drop PPI dose. She will be due for surveillance EGD in 2 years unless she was to develop dysphagia.   Plan:  Medication list updated. She will continue pantoprazole 40 mg by mouth 30 minutes before breakfast and evening meal. Please note that she is taking clopidogrel at bedtime to minimize interaction with PPI. Office visit in 6 months.

## 2020-04-16 ENCOUNTER — Other Ambulatory Visit (HOSPITAL_COMMUNITY): Payer: Self-pay | Admitting: Internal Medicine

## 2020-04-16 ENCOUNTER — Other Ambulatory Visit: Payer: Self-pay | Admitting: Internal Medicine

## 2020-04-16 DIAGNOSIS — R0989 Other specified symptoms and signs involving the circulatory and respiratory systems: Secondary | ICD-10-CM

## 2020-04-17 ENCOUNTER — Other Ambulatory Visit: Payer: Self-pay | Admitting: Internal Medicine

## 2020-04-17 DIAGNOSIS — R0989 Other specified symptoms and signs involving the circulatory and respiratory systems: Secondary | ICD-10-CM

## 2020-04-18 DIAGNOSIS — R0989 Other specified symptoms and signs involving the circulatory and respiratory systems: Secondary | ICD-10-CM | POA: Diagnosis not present

## 2020-05-09 DIAGNOSIS — E1129 Type 2 diabetes mellitus with other diabetic kidney complication: Secondary | ICD-10-CM | POA: Diagnosis not present

## 2020-05-09 DIAGNOSIS — Z794 Long term (current) use of insulin: Secondary | ICD-10-CM | POA: Diagnosis not present

## 2020-05-14 DIAGNOSIS — K219 Gastro-esophageal reflux disease without esophagitis: Secondary | ICD-10-CM | POA: Diagnosis not present

## 2020-05-14 DIAGNOSIS — L84 Corns and callosities: Secondary | ICD-10-CM | POA: Diagnosis not present

## 2020-05-14 DIAGNOSIS — E7849 Other hyperlipidemia: Secondary | ICD-10-CM | POA: Diagnosis not present

## 2020-05-14 DIAGNOSIS — M1991 Primary osteoarthritis, unspecified site: Secondary | ICD-10-CM | POA: Diagnosis not present

## 2020-05-14 DIAGNOSIS — E118 Type 2 diabetes mellitus with unspecified complications: Secondary | ICD-10-CM | POA: Diagnosis not present

## 2020-05-14 DIAGNOSIS — I10 Essential (primary) hypertension: Secondary | ICD-10-CM | POA: Diagnosis not present

## 2020-05-19 ENCOUNTER — Other Ambulatory Visit (INDEPENDENT_AMBULATORY_CARE_PROVIDER_SITE_OTHER): Payer: Self-pay | Admitting: *Deleted

## 2020-05-19 DIAGNOSIS — K2270 Barrett's esophagus without dysplasia: Secondary | ICD-10-CM

## 2020-05-19 DIAGNOSIS — R1319 Other dysphagia: Secondary | ICD-10-CM

## 2020-05-19 DIAGNOSIS — K2100 Gastro-esophageal reflux disease with esophagitis, without bleeding: Secondary | ICD-10-CM

## 2020-05-19 MED ORDER — PANTOPRAZOLE SODIUM 40 MG PO TBEC: 40.0000 mg | DELAYED_RELEASE_TABLET | Freq: Two times a day (BID) | ORAL | 3 refills | Status: DC

## 2020-07-02 DIAGNOSIS — H401122 Primary open-angle glaucoma, left eye, moderate stage: Secondary | ICD-10-CM | POA: Diagnosis not present

## 2020-07-02 DIAGNOSIS — H401112 Primary open-angle glaucoma, right eye, moderate stage: Secondary | ICD-10-CM | POA: Diagnosis not present

## 2020-07-02 DIAGNOSIS — E103293 Type 1 diabetes mellitus with mild nonproliferative diabetic retinopathy without macular edema, bilateral: Secondary | ICD-10-CM | POA: Diagnosis not present

## 2020-07-02 DIAGNOSIS — H47293 Other optic atrophy, bilateral: Secondary | ICD-10-CM | POA: Diagnosis not present

## 2020-08-19 DIAGNOSIS — I10 Essential (primary) hypertension: Secondary | ICD-10-CM | POA: Diagnosis not present

## 2020-08-19 DIAGNOSIS — M1991 Primary osteoarthritis, unspecified site: Secondary | ICD-10-CM | POA: Diagnosis not present

## 2020-08-19 DIAGNOSIS — K219 Gastro-esophageal reflux disease without esophagitis: Secondary | ICD-10-CM | POA: Diagnosis not present

## 2020-08-19 DIAGNOSIS — E1065 Type 1 diabetes mellitus with hyperglycemia: Secondary | ICD-10-CM | POA: Diagnosis not present

## 2020-10-01 DIAGNOSIS — Z1211 Encounter for screening for malignant neoplasm of colon: Secondary | ICD-10-CM | POA: Diagnosis not present

## 2020-10-01 DIAGNOSIS — Z1212 Encounter for screening for malignant neoplasm of rectum: Secondary | ICD-10-CM | POA: Diagnosis not present

## 2020-10-07 ENCOUNTER — Ambulatory Visit (INDEPENDENT_AMBULATORY_CARE_PROVIDER_SITE_OTHER): Payer: Medicare Other | Admitting: Internal Medicine

## 2020-10-12 LAB — COLOGUARD: COLOGUARD: NEGATIVE

## 2020-10-12 LAB — EXTERNAL GENERIC LAB PROCEDURE: COLOGUARD: NEGATIVE

## 2020-10-20 ENCOUNTER — Other Ambulatory Visit: Payer: Self-pay

## 2020-10-20 ENCOUNTER — Ambulatory Visit: Payer: Medicare Other | Admitting: Endocrinology

## 2020-10-20 ENCOUNTER — Encounter: Payer: Self-pay | Admitting: Endocrinology

## 2020-10-20 VITALS — BP 190/84 | HR 85 | Ht 59.5 in | Wt 133.4 lb

## 2020-10-20 DIAGNOSIS — E1059 Type 1 diabetes mellitus with other circulatory complications: Secondary | ICD-10-CM

## 2020-10-20 DIAGNOSIS — E1159 Type 2 diabetes mellitus with other circulatory complications: Secondary | ICD-10-CM

## 2020-10-20 DIAGNOSIS — E1065 Type 1 diabetes mellitus with hyperglycemia: Secondary | ICD-10-CM | POA: Diagnosis not present

## 2020-10-20 LAB — POCT GLYCOSYLATED HEMOGLOBIN (HGB A1C): Hemoglobin A1C: 8.7 % — AB (ref 4.0–5.6)

## 2020-10-20 LAB — BASIC METABOLIC PANEL
BUN: 28 mg/dL — ABNORMAL HIGH (ref 6–23)
CO2: 27 mEq/L (ref 19–32)
Calcium: 10.3 mg/dL (ref 8.4–10.5)
Chloride: 104 mEq/L (ref 96–112)
Creatinine, Ser: 0.86 mg/dL (ref 0.40–1.20)
GFR: 66.28 mL/min (ref >60.00)
Glucose, Bld: 222 mg/dL — ABNORMAL HIGH (ref 70–99)
Potassium: 4.1 mEq/L (ref 3.5–5.1)
Sodium: 138 mEq/L (ref 135–145)

## 2020-10-20 LAB — TSH: TSH: 1.6 u[IU]/mL (ref 0.35–4.50)

## 2020-10-20 LAB — T4, FREE: Free T4: 1.96 ng/dL — ABNORMAL HIGH (ref 0.60–1.60)

## 2020-10-20 MED ORDER — METFORMIN HCL ER 500 MG PO TB24: 500.0000 mg | ORAL_TABLET | Freq: Two times a day (BID) | ORAL | 3 refills | Status: DC

## 2020-10-20 NOTE — Progress Notes (Signed)
Subjective:    Patient ID: Nancy Sutton, female    DOB: 1946-05-13, 75 y.o.   MRN: [id number]  HPI pt is referred by Dr Sherwood Gambler, for diabetes.  Pt states DM was dx'ed in 2008; it is complicated by CVA; she has been on insulin since 2015; pt says her diet and exercise are fair; she has never had GDM, pancreatitis, pancreatic surgery, or DKA.  Last episode of severe hypoglycemia was in late 2021.  She takes Levemir 42 units qhs, and Novolog 10-13 units 3 times a day (just before each meal), depending on cbg.  She also takes metformin, 500 mg BID.  No recent hypoglycemia.  She says cbg varies from 80-265.  It is in general lowest at HS, and highest in the afternoon.  However, there is little trend throughout the day.  Pt says she cannot afford continuous glucose monitor.  He gets insulins from [employer] (pt assist).   Past Medical History:  Diagnosis Date   Arthritis    Diabetes mellitus    since 2008   Essential hypertension, benign 09/10/2015   GERD (gastroesophageal reflux disease)    Hypercholesteremia    Hyperlipidemia    Stroke (HCC) 03/2011   only deficits-affected eyesight    Past Surgical History:  Procedure Laterality Date   CATARACT EXTRACTION W/PHACO  11/01/2011   Procedure: CATARACT EXTRACTION PHACO AND INTRAOCULAR LENS PLACEMENT (IOC);  Surgeon: Gemma Payor, MD;  Location: [facility];  Service: Ophthalmology;  Laterality: Right;  CDE 12.48   ESOPHAGEAL DILATION N/A 04/29/2017   Procedure: ESOPHAGEAL DILATION;  Surgeon: Malissa Hippo, MD;  Location: [facility];  Service: Endoscopy;  Laterality: N/A;   ESOPHAGOGASTRODUODENOSCOPY N/A 04/29/2017   Procedure: ESOPHAGOGASTRODUODENOSCOPY (EGD);  Surgeon: Malissa Hippo, MD;  Location: [facility];  Service: Endoscopy;  Laterality: N/A;  1015   ESOPHAGOGASTRODUODENOSCOPY (EGD) WITH ESOPHAGEAL DILATION N/A 02/14/2013   Procedure: ESOPHAGOGASTRODUODENOSCOPY (EGD) WITH ESOPHAGEAL DILATION;  Surgeon: Malissa Hippo, MD;  Location:  [facility];  Service: Endoscopy;  Laterality: N/A;  200-moved to 255 Ann to notify pt   EYE SURGERY  10-2010   left eye removal of cataract   TUBAL LIGATION  1984    Social History   Socioeconomic History   Marital status: Married    Spouse name: wayne   Number of children: 2   Years of education: 12   Highest education level: Not on file  Occupational History   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   Patient is right handed.   Patient has hs education.   Patient drinks 3-5 glasses of tea daily.   Social Determinants of Health   Financial Resource Strain: Not on file  Food Insecurity: Not on file  Transportation Needs: Not on file  Physical Activity: Not on file  Stress: Not on file  Social Connections: Not on file  Intimate Partner Violence: Not on file    Current Outpatient Medications on File Prior to Visit  Medication Sig Dispense Refill  brimonidine (ALPHAGAN) 0.2 % ophthalmic solution Place 1 drop into both eyes 3 (three) times daily.      calcium carbonate (TUMS - DOSED IN MG ELEMENTAL CALCIUM) 500 MG chewable tablet Chew 1 tablet by mouth at bedtime as needed for indigestion or heartburn.     clopidogrel (PLAVIX) 75 MG tablet Take 1 tablet (75 mg total) by mouth daily. 90 tablet 3   dorzolamide-timolol (COSOPT) 22.3-6.8 MG/ML ophthalmic solution Place 1 drop into both eyes 2 (two) times daily.     GLOBAL EASE INJECT PEN NEEDLES 32G X 4 MM MISC USE 4 PENTIPS EVERY DAY 100 each 5   GLOBAL INJECT EASE LANCETS 30G MISC DIABETIC CLUB 100  each 12   glucose blood (TRUE METRIX BLOOD GLUCOSE TEST) test strip Use as instructed 4 x daily 150 each 11   insulin detemir (LEVEMIR) 100 UNIT/ML FlexPen Inject 44 Units into the skin at bedtime. 15 mL 0   latanoprost (XALATAN) 0.005 % ophthalmic solution Place 1 drop into the right eye at bedtime.  11   losartan (COZAAR) 25 MG tablet Take 25 mg by mouth daily.      Multiple Vitamin (ONE-A-DAY 55 PLUS PO) Take 1 tablet by mouth daily.      naproxen sodium (ANAPROX) 220 MG tablet Take 220 mg by mouth daily as needed (pain).     pantoprazole (PROTONIX) 40 MG tablet Take 1 tablet (40 mg total) by mouth 2 (two) times daily. 180 tablet 3   simvastatin (ZOCOR) 80 MG tablet Take 80 mg by mouth every evening.      vitamin C (ASCORBIC ACID) 500 MG tablet Take 500 mg by mouth daily.     Vitamin D, Cholecalciferol, 50 MCG (2000 UT) CAPS Take by mouth daily.     vitamin E 180 MG (400 UNITS) capsule Take 400 Units by mouth daily.     No current facility-administered medications on file prior to visit.    Allergies  Allergen Reactions   Ampicillin Hives    Breaks out in bumps Has patient had a PCN reaction causing immediate rash, facial/tongue/throat swelling, SOB or lightheadedness with hypotension: No Has patient had a PCN reaction causing severe rash involving mucus membranes or skin necrosis: Yes Has patient had a PCN reaction that required hospitalization: No Has patient had a PCN reaction occurring within the last 10 years: No If all of the above answers are "NO", then may proceed with Cephalosporin use.    Bromday [Bromfenac Sodium] Itching and Swelling   Lisinopril Cough   Tetanus Toxoids Other (See Comments)    Breaks out in bumps    Family History  Problem Relation Age of Onset   Cancer Mother    Diabetes Mother    Cancer Father    Diabetes Father    Anesthesia problems Neg Hx    Hypotension Neg Hx    Malignant hyperthermia Neg Hx    Pseudochol deficiency  Neg Hx     BP (!) 190/84 (BP Location: Right Arm, Patient Position: Sitting, Cuff Size: Normal)    Pulse 85    Ht 4' 11.5" (1.511 m)    Wt 133 lb 6.4 oz (60.5 kg)    SpO2 98%    BMI 26.49 kg/m    Review of Systems denies chest pain, sob, n/v, urinary frequency, memory loss, and depression. She has weight gain.      Objective:   Physical Exam VITAL SIGNS:  See vs page GENERAL: no distress Pulses: dorsalis pedis intact  bilat.   MSK: no deformity of the feet CV: 1+ bilat leg edema Skin:  no ulcer on the feet.  normal color and temp on the feet.   Neuro: sensation is intact to touch on the feet.     Lab Results  Component Value Date   HGBA1C 8.7 (A) 10/20/2020    Lab Results  Component Value Date   CREATININE 0.82 07/20/2019   BUN 25 07/20/2019   NA 141 07/20/2019   K 4.1 07/20/2019   CL 107 07/20/2019   CO2 27 07/20/2019   I have reviewed outside records, and summarized: Pt was noted to have elevated A1c, and referred here.  Despite multiple med adjustments, she had persistently elev A1c      Assessment & Plan:  Insulin-requiring type 2 DM, with CVA: uncontrolled. I have sent a prescription to your pharmacy, to change Novolog to a set amount with each meal.  HTN: is noted today   Patient Instructions  Your blood pressure is high today.  Please see your primary care provider soon, to have it rechecked good diet and exercise significantly improve the control of your diabetes.  please let me know if you wish to be referred to a dietician.  high blood sugar is very risky to your health.  you should see an eye doctor and dentist every year.  It is very important to get all recommended vaccinations.  Controlling your blood pressure and cholesterol drastically reduces the damage diabetes does to your body.  Those who smoke should quit.  Please discuss these with your doctor.  check your blood sugar 4 times a day: before the 3 meals, and at bedtime.  also check if you have  symptoms of your blood sugar being too high or too low.  please keep a record of the readings and bring it to your next appointment here (or you can bring the meter itself).  You can write it on any piece of paper.  please call us sooner if your blood sugar goes below 70, or if you have a lot of readings over 200.   We will need to take this complex situation in stages.   A different type of diabetes blood test is requested for you today.  We'll let you know about the results.   If this confirms the A1c, we'll change the Novolog to a set number of units, and continue the same Levemir.   When you are due for the forms for your insulins to be filled out again, we are happy to start doing that here if you want.   Please come back for a follow-up appointment in 1 month.

## 2020-10-20 NOTE — Patient Instructions (Addendum)
Your blood pressure is high today.  Please see your primary care provider soon, to have it rechecked good diet and exercise significantly improve the control of your diabetes.  please let me know if you wish to be referred to a dietician.  high blood sugar is very risky to your health.  you should see an eye doctor and dentist every year.  It is very important to get all recommended vaccinations.  Controlling your blood pressure and cholesterol drastically reduces the damage diabetes does to your body.  Those who smoke should quit.  Please discuss these with your doctor.  check your blood sugar 4 times a day: before the 3 meals, and at bedtime.  also check if you have symptoms of your blood sugar being too high or too low.  please keep a record of the readings and bring it to your next appointment here (or you can bring the meter itself).  You can write it on any piece of paper.  please call us sooner if your blood sugar goes below 70, or if you have a lot of readings over 200.   We will need to take this complex situation in stages.   A different type of diabetes blood test is requested for you today.  We'll let you know about the results.   If this confirms the A1c, we'll change the Novolog to a set number of units, and continue the same Levemir.   When you are due for the forms for your insulins to be filled out again, we are happy to start doing that here if you want.   Please come back for a follow-up appointment in 1 month.

## 2020-10-21 ENCOUNTER — Telehealth: Payer: Self-pay | Admitting: Endocrinology

## 2020-10-21 MED ORDER — METFORMIN HCL ER 500 MG PO TB24: 500.0000 mg | ORAL_TABLET | Freq: Every day | ORAL | 3 refills | Status: DC

## 2020-10-21 NOTE — Telephone Encounter (Signed)
FYI

## 2020-10-21 NOTE — Telephone Encounter (Signed)
Ok, thanks.  Please continue just 1 pill per day

## 2020-10-21 NOTE — Telephone Encounter (Signed)
Pt called to let Dr.Ellison know that after her appt yesterday she remembered what mg she was on and wanted him to know. Pt states her Metformin is 500mg 

## 2020-10-22 LAB — FRUCTOSAMINE: Fructosamine: 397 umol/L — ABNORMAL HIGH (ref 205–285)

## 2020-10-22 MED ORDER — NOVOLOG FLEXPEN 100 UNIT/ML ~~LOC~~ SOPN: 15.0000 [IU] | PEN_INJECTOR | Freq: Three times a day (TID) | SUBCUTANEOUS | 2 refills | Status: DC

## 2020-10-23 NOTE — Telephone Encounter (Signed)
Patient wanted Dr Everardo All that her blood pressure about 1.5 hours after her visit and high reading (in office) was 105/54

## 2020-10-23 NOTE — Telephone Encounter (Signed)
LVM--to call the office back regarding B/p.

## 2020-10-23 NOTE — Telephone Encounter (Signed)
Pt called to let us know her blood pressure is okay now, she just wanted Dr to be aware her blood pressure always gets high when she goes to Dr but then an hour later it goes back down....FYI

## 2020-10-24 ENCOUNTER — Telehealth: Payer: Self-pay | Admitting: Endocrinology

## 2020-10-24 NOTE — Telephone Encounter (Signed)
fyi

## 2020-10-24 NOTE — Telephone Encounter (Signed)
Patient's referring provider [facility] called to advise that the 26 page fax we are sending them did not come through completely.  They got 2 pages of 26.  They requested that we attempt to resend the 26 pages to [phone]

## 2020-10-28 NOTE — Telephone Encounter (Signed)
Last note faxed [phone]--att: tylor

## 2020-10-30 ENCOUNTER — Telehealth: Payer: Self-pay | Admitting: Endocrinology

## 2020-10-30 NOTE — Telephone Encounter (Signed)
Patient requests to be called at ph# [phone] (if Patient is unable to answer, Patient requests to receive a detailed message on voice mail)  Re: Patient states her insurance only covers A1C every 91 days. Patient wants to know if Dr. Everardo All is okay with Patient changing her appointment from 11/25/20 to Jan 20, 2021 so that Patient's insurance will cover A1C.

## 2020-10-30 NOTE — Telephone Encounter (Signed)
You can wait if you want, but we don't necessarily do A1c at each ov, and we would never do over your objection.

## 2020-11-05 ENCOUNTER — Telehealth: Payer: Self-pay | Admitting: Endocrinology

## 2020-11-05 ENCOUNTER — Other Ambulatory Visit: Payer: Self-pay | Admitting: *Deleted

## 2020-11-05 DIAGNOSIS — E1059 Type 1 diabetes mellitus with other circulatory complications: Secondary | ICD-10-CM

## 2020-11-05 MED ORDER — ONETOUCH ULTRA VI STRP: ORAL_STRIP | 12 refills | Status: DC

## 2020-11-05 MED ORDER — ONETOUCH ULTRA 2 W/DEVICE KIT: PACK | 0 refills | Status: DC

## 2020-11-05 NOTE — Telephone Encounter (Signed)
Rx One touch Ultra 2 meter and test strips to [facility].

## 2020-11-05 NOTE — Telephone Encounter (Signed)
Pt called and stated insurance no longer covers her current meter and test strips and so she would like [place] to send a prescription for the One Touch Ultra 2 meter and test strips to go with it.   PHARMACY [facility] - [place], [place] - [address] Phone:  [phone]  Fax:  [phone]

## 2020-11-06 ENCOUNTER — Other Ambulatory Visit: Payer: Self-pay | Admitting: *Deleted

## 2020-11-06 DIAGNOSIS — E1059 Type 1 diabetes mellitus with other circulatory complications: Secondary | ICD-10-CM

## 2020-11-06 MED ORDER — ONETOUCH ULTRA VI STRP: ORAL_STRIP | 12 refills | Status: DC

## 2020-11-06 NOTE — Telephone Encounter (Signed)
Resent Rx to the pharmacy 

## 2020-11-06 NOTE — Telephone Encounter (Signed)
Patient says the Rx needs to be resent to [facility] for "100 test strips every 25 days" and to flag to "dispense as written" - this is due to insurance purposes.

## 2020-11-14 NOTE — Telephone Encounter (Signed)
Pt called because she states she had a conversation with Tobi Bastos that she always carries a meter with her in her purse and leaves one at home and states that Tobi Bastos was going to be sending her an extra meter to [facility] along with her prescription for one. She has picked up the prescribed meter but was wondering if that extra meter we had has been sent to them yet because they are saying they haven't received anything.   [phone] Can leave a message if has any questions.

## 2020-11-15 NOTE — Telephone Encounter (Signed)
Called pt--stated already received the meter (onetouch) and test strip from the pharmacy. Notified pt cannot prescribe extra meter due to insurance not covered but can purchase OTC meter. Pt understood without questions

## 2020-11-21 ENCOUNTER — Other Ambulatory Visit: Payer: Self-pay

## 2020-11-25 ENCOUNTER — Other Ambulatory Visit: Payer: Self-pay

## 2020-11-25 ENCOUNTER — Ambulatory Visit: Payer: Medicare Other | Admitting: Endocrinology

## 2020-11-25 VITALS — BP 160/70 | HR 74 | Ht 59.0 in | Wt 131.4 lb

## 2020-11-25 DIAGNOSIS — E1159 Type 2 diabetes mellitus with other circulatory complications: Secondary | ICD-10-CM

## 2020-11-25 DIAGNOSIS — E1059 Type 1 diabetes mellitus with other circulatory complications: Secondary | ICD-10-CM

## 2020-11-25 MED ORDER — NOVOLOG FLEXPEN 100 UNIT/ML ~~LOC~~ SOPN: 10.0000 [IU] | PEN_INJECTOR | Freq: Three times a day (TID) | SUBCUTANEOUS | 2 refills | Status: DC

## 2020-11-25 MED ORDER — TRULICITY 0.75 MG/0.5ML ~~LOC~~ SOAJ: 0.7500 mg | SUBCUTANEOUS | 3 refills | Status: DC

## 2020-11-25 NOTE — Progress Notes (Signed)
Subjective:    Patient ID: Nancy Sutton, female    DOB: 1946/06/20, 75 y.o.   MRN: [id number]  HPI Pt returns for f/u of diabetes mellitus: DM type: Insulin-requiring type 2 Dx'ed: 0233 Complications: CVA Therapy: insulin since 2015, and metformin.   GDM: never DKA: never Severe hypoglycemia: never Pancreatitis: never Pancreatic imaging: never SDOH: she cannot afford continuous glucose monitor; she gets insulins from [facility] (pt assist) Other: she takes multiple daily injections Interval history: she brings a record of her cbg's which I have reviewed today.  cbg varies from 97-357.  It is in general lowest in the afternoon.  Otherwise, there is no trend throughout the day.   Past Medical History:  Diagnosis Date  . Arthritis   . Diabetes mellitus    since 2008  . Essential hypertension, benign 09/10/2015  . GERD (gastroesophageal reflux disease)   . Hypercholesteremia   . Hyperlipidemia   . Stroke ([place]) 03/2011   only deficits-affected eyesight    Past Surgical History:  Procedure Laterality Date  . CATARACT EXTRACTION W/PHACO  11/01/2011   Procedure: CATARACT EXTRACTION PHACO AND INTRAOCULAR LENS PLACEMENT (IOC);  Surgeon: Tonny Branch, MD;  Location: [facility];  Service: Ophthalmology;  Laterality: Right;  CDE 12.48  . ESOPHAGEAL DILATION N/A 04/29/2017   Procedure: ESOPHAGEAL DILATION;  Surgeon: Rogene Houston, MD;  [facility];  Service: Endoscopy;  Laterality: N/A;  . ESOPHAGOGASTRODUODENOSCOPY N/A 04/29/2017   Procedure: ESOPHAGOGASTRODUODENOSCOPY (EGD);  Surgeon: Rogene Houston, MD;  [facility];  Service: Endoscopy;  Laterality: N/A;  1015  . ESOPHAGOGASTRODUODENOSCOPY (EGD) WITH ESOPHAGEAL DILATION N/A 02/14/2013   Procedure: ESOPHAGOGASTRODUODENOSCOPY (EGD) WITH ESOPHAGEAL DILATION;  Surgeon: Rogene Houston, MD;  [facility];  Service: Endoscopy;  Laterality: N/A;  200-moved to 255 Ann to notify pt  . EYE SURGERY  10-2010   left eye  removal of cataract  . TUBAL LIGATION  1984    Social History   Socioeconomic History  . Marital status: Married    Spouse name: wayne  . Number of children: 2  . Years of education: 25  . Highest education level: Not on file  Occupational History  . 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 Smoking status: Never Smoker  . Smokeless tobacco: Never Used  Vaping Use  . Vaping Use: Never used  Substance and Sexual Activity  . Alcohol use: No    Alcohol/week: 0.0 standard drinks  . Drug use: No  . Sexual activity: Yes    Birth control/protection: None  Other Topics Concern  . Not on file  Social History Narrative   Patient is married with 2 children.   Patient is right handed.   Patient has hs education.   Patient drinks 3-5 glasses of tea daily.   Social Determinants of Health   Financial Resource Strain: Not on file  Food Insecurity: Not on file  Transportation Needs: Not on file  Physical Activity: Not on file  Stress: Not on file  Social Connections: Not on file  Intimate Partner Violence: Not on file    Current Outpatient Medications on File Prior to Visit  Medication Sig Dispense Refill  . Blood Glucose Monitoring Suppl (ONE TOUCH ULTRA 2) w/Device KIT : Use as instructed 4 x daily 1 kit 0  . brimonidine (ALPHAGAN) 0.2 % ophthalmic solution Place 1 drop into both eyes 3 (three) times daily.     Marland Kitchen  calcium carbonate (TUMS - DOSED IN MG ELEMENTAL CALCIUM) 500 MG chewable tablet Chew 1 tablet by mouth at bedtime as needed for indigestion or heartburn.    . clopidogrel (PLAVIX) 75 MG tablet Take 1 tablet (75 mg total) by mouth daily. 90 tablet 3  . dorzolamide-timolol (COSOPT) 22.3-6.8 MG/ML ophthalmic solution Place 1 drop into both eyes 2 (two) times daily.    Marland Kitchen GLOBAL EASE INJECT PEN NEEDLES 32G X 4 MM MISC USE 4 PENTIPS EVERY DAY 100 each 5  . GLOBAL INJECT EASE LANCETS 30G MISC DIABETIC CLUB 100 each 12  . insulin  detemir (LEVEMIR) 100 UNIT/ML FlexPen Inject 44 Units into the skin at bedtime. 15 mL 0  . latanoprost (XALATAN) 0.005 % ophthalmic solution Place 1 drop into the right eye at bedtime.  11  . losartan (COZAAR) 25 MG tablet Take 25 mg by mouth daily.     . metFORMIN (GLUCOPHAGE-XR) 500 MG 24 hr tablet Take 1 tablet (500 mg total) by mouth daily. 90 tablet 3  . Multiple Vitamin (ONE-A-DAY 55 PLUS PO) Take 1 tablet by mouth daily.     . naproxen sodium (ANAPROX) 220 MG tablet Take 220 mg by mouth daily as needed (pain).    Glory Rosebush ULTRA test strip Use as instructed 4 x daily 100 each 12  . pantoprazole (PROTONIX) 40 MG tablet Take 1 tablet (40 mg total) by mouth 2 (two) times daily. 180 tablet 3  . simvastatin (ZOCOR) 80 MG tablet Take 80 mg by mouth every evening.     . vitamin C (ASCORBIC ACID) 500 MG tablet Take 500 mg by mouth daily.    . Vitamin D, Cholecalciferol, 50 MCG (2000 UT) CAPS Take by mouth daily.    . vitamin E 180 MG (400 UNITS) capsule Take 400 Units by mouth daily.     No current facility-administered medications on file prior to visit.    Allergies  Allergen Reactions  . Ampicillin Hives    Breaks out in bumps Has patient had a PCN reaction causing immediate rash, facial/tongue/throat swelling, SOB or lightheadedness with hypotension: No Has patient had a PCN reaction causing severe rash involving mucus membranes or skin necrosis: Yes Has patient had a PCN reaction that required hospitalization: No Has patient had a PCN reaction occurring within the last 10 years: No If all of the above answers are "NO", then may proceed with Cephalosporin use.   Sharen Hint [Bromfenac Sodium] Itching and Swelling  . Lisinopril Cough  . Tetanus Toxoids Other (See Comments)    Breaks out in bumps    Family History  Problem Relation Age of Onset  . Cancer Mother   . Diabetes Mother   . Cancer Father   . Diabetes Father   . Anesthesia problems Neg Hx   . Hypotension Neg Hx   .  Malignant hyperthermia Neg Hx   . Pseudochol deficiency Neg Hx     BP (!) 160/70 (BP Location: Right Arm, Patient Position: Sitting, Cuff Size: Normal)   Pulse 74   Ht 4' 11"  (1.499 m)   Wt 131 lb 6.4 oz (59.6 kg)   SpO2 99%   BMI 26.54 kg/m   Review of Systems She denies hypoglycemia    Objective:   Physical Exam VITAL SIGNS:  See vs page GENERAL: no distress Pulses: dorsalis pedis intact bilat.   MSK: no deformity of the feet CV: trace bilat leg edema Skin:  no ulcer on the feet.  normal color and  temp on the feet.   Neuro: sensation is intact to touch on the feet.       Assessment & Plan:  Insulin-requiring type 2 DM, with cva: uncontrolled Your blood pressure is high today.  Please see your primary care provider soon, to have it rechecked.   GERD: we'll follow on GLP rx.   Patient Instructions  Your blood pressure is high today.  Please see your primary care provider soon, to have it rechecked check your blood sugar 4 times a day: before the 3 meals, and at bedtime.  also check if you have symptoms of your blood sugar being too high or too low.  please keep a record of the readings and bring it to your next appointment here (or you can bring the meter itself).  You can write it on any piece of paper.  please call us sooner if your blood sugar goes below 70, or if you have a lot of readings over 200.   We will need to take this complex situation in stages.   I have sent a prescription to your pharmacy, to add Trulicity. Please call if this is too expensive, or worsens your heartburn.  Please continue the same Levemir, and: Change the novolog to 10-15 units 3 times a day (just before each meal).  Please come back for a follow-up appointment in 1 month.

## 2020-11-25 NOTE — Patient Instructions (Addendum)
Your blood pressure is high today.  Please see your primary care provider soon, to have it rechecked check your blood sugar 4 times a day: before the 3 meals, and at bedtime.  also check if you have symptoms of your blood sugar being too high or too low.  please keep a record of the readings and bring it to your next appointment here (or you can bring the meter itself).  You can write it on any piece of paper.  please call us sooner if your blood sugar goes below 70, or if you have a lot of readings over 200.   We will need to take this complex situation in stages.   I have sent a prescription to your pharmacy, to add Trulicity. Please call if this is too expensive, or worsens your heartburn.  Please continue the same Levemir, and: Change the novolog to 10-15 units 3 times a day (just before each meal).  Please come back for a follow-up appointment in 1 month.

## 2020-11-28 ENCOUNTER — Telehealth: Payer: Self-pay | Admitting: Endocrinology

## 2020-11-28 NOTE — Telephone Encounter (Signed)
Please continue the same Levemir, and: Change the novolog to 10-15 units 3 times a day (just before each meal).

## 2020-11-28 NOTE — Telephone Encounter (Signed)
Pt states at her last appt Dr told her he wanted to put her on the Trulicity shot once a week. Pt states she is going to start it either Sunday or Monday and is wondering how the units are changing for her Novalog and Levemir for when she starts this new medication. Pt requests a call regarding this at [phone] and pt states she may be at a Dr appt today so if she doesn't pick up we have permission to leave a detailed message regarding this on the answering machine.

## 2020-11-28 NOTE — Telephone Encounter (Signed)
LVM for pt regarding her Levemir and Novolog instruction per Everardo All.

## 2020-11-28 NOTE — Telephone Encounter (Signed)
Please Advise

## 2020-11-28 NOTE — Telephone Encounter (Signed)
Patient returned call - said if we needed to call her back and did not reach her to please leave a message on her voicemail

## 2020-12-09 DIAGNOSIS — H401131 Primary open-angle glaucoma, bilateral, mild stage: Secondary | ICD-10-CM | POA: Diagnosis not present

## 2020-12-12 ENCOUNTER — Other Ambulatory Visit: Payer: Self-pay | Admitting: Internal Medicine

## 2020-12-12 DIAGNOSIS — Z1231 Encounter for screening mammogram for malignant neoplasm of breast: Secondary | ICD-10-CM

## 2020-12-24 ENCOUNTER — Telehealth: Payer: Self-pay | Admitting: Endocrinology

## 2020-12-24 NOTE — Telephone Encounter (Signed)
MEDICATION: Metformin   PHARMACY:  [facility]  HAS THE PATIENT CONTACTED THEIR PHARMACY?  yes  IS THIS A 90 DAY SUPPLY : YES  IS PATIENT OUT OF MEDICATION: no  IF NOT; HOW MUCH IS LEFT:   LAST APPOINTMENT DATE: @3 /07/2021  NEXT APPOINTMENT DATE:@5 /12/2020  DO WE HAVE YOUR PERMISSION TO LEAVE A DETAILED MESSAGE?:  OTHER COMMENTS: pharmacy needs a new RX with correct dosing instructions and MG - patient's current RX on file is different from how patient is taking   **Let patient know to contact pharmacy at the end of the day to make sure medication is ready. **  ** Please notify patient to allow 48-72 hours to process**  **Encourage patient to contact the pharmacy for refills or they can request refills through [facility]**

## 2021-01-07 ENCOUNTER — Telehealth (INDEPENDENT_AMBULATORY_CARE_PROVIDER_SITE_OTHER): Payer: Self-pay

## 2021-01-07 ENCOUNTER — Encounter (INDEPENDENT_AMBULATORY_CARE_PROVIDER_SITE_OTHER): Payer: Self-pay | Admitting: Gastroenterology

## 2021-01-07 ENCOUNTER — Other Ambulatory Visit: Payer: Self-pay

## 2021-01-07 ENCOUNTER — Ambulatory Visit (INDEPENDENT_AMBULATORY_CARE_PROVIDER_SITE_OTHER): Payer: Medicare Other | Admitting: Gastroenterology

## 2021-01-07 VITALS — BP 153/73 | HR 91 | Temp 98.0°F | Ht 59.0 in | Wt 127.0 lb

## 2021-01-07 DIAGNOSIS — R1319 Other dysphagia: Secondary | ICD-10-CM | POA: Diagnosis not present

## 2021-01-07 DIAGNOSIS — K2100 Gastro-esophageal reflux disease with esophagitis, without bleeding: Secondary | ICD-10-CM | POA: Diagnosis not present

## 2021-01-07 DIAGNOSIS — K2270 Barrett's esophagus without dysplasia: Secondary | ICD-10-CM

## 2021-01-07 MED ORDER — OMEPRAZOLE 40 MG PO CPDR: 40.0000 mg | DELAYED_RELEASE_CAPSULE | Freq: Every day | ORAL | 3 refills | Status: DC

## 2021-01-07 NOTE — Telephone Encounter (Signed)
Patient will come in to the office on 01/07/2021.

## 2021-01-07 NOTE — Patient Instructions (Addendum)
Start omeprazole 40 mg qday Stop pantoprazole Explained presumed etiology of reflux symptoms. Instruction provided in the use of antireflux medication - patient should take medication in the morning 30-45 minutes before eating breakfast. Discussed avoidance of eating within 2 hours of lying down to sleep and benefit of blocks to elevate head of bed.

## 2021-01-07 NOTE — Progress Notes (Signed)
[name], M.D. Gastroenterology & Hepatology [facility] [address] [place], [place] [address]  Primary Care Physician: Redmond School, MD [address] [place] [address]  I will communicate my assessment and recommendations to the referring MD via EMR.  Problems: 1. GERD 2. History of peptic esophageal stricture 3. 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  The patient was last seen on 04/01/2020. At that time, the patient was continued on pantoprazole 40 mg every 12 hours.  Patient reports she has presented with episodes of heartburn despite taking the pantoprazole compliantly as told in past.  She states that heartburn happens at any point of the day, usually after eating a meal.  She reports that she has presented persistent symptoms that have caused her significant discomfort.  In the past she used to take omeprazole but was switched to pantoprazole as there was a concern for possible interaction with her Plavix.  Patient also reported that she has presented some episodes of dysphagia in the past 2 months , she describes the food gets stuck in the middle of her chest. This leads to discomfort frequently as the dysphagia is happening on a daily basis.  She reports having mostly problems with solid food. Occasionally has to vomit to improve her dysphagia symptoms. No odynophagia.  The patient denies having any nausea, vomiting, fever, chills, hematochezia, melena, hematemesis, abdominal distention, abdominal pain, diarrhea, jaundice, pruritus or weight loss.  Last EGD:04/29/2017, found to have a  benign stricture 33 cm from the incisors with lumen measuring 1.3 cm, this was dilated with a balloon up to 19 mm, there was a 4 cm hiatal hernia, grade a esophagitis and changes suspicious for 1 cm short segment Barrett's esophagus.  There are few gastric polyps, also normal small bowel Last Colonoscopy: never in the past but had negative Cologuard 2 months ago  Past Medical History: Past Medical History:  Diagnosis Date  . Arthritis   . Diabetes mellitus    since 2008  . Essential hypertension, benign 09/10/2015  . GERD (gastroesophageal reflux disease)   . Hypercholesteremia   . Hyperlipidemia   . Stroke ([place]) 03/2011   only deficits-affected eyesight    Past Surgical History: Past Surgical History:  Procedure Laterality Date  . CATARACT EXTRACTION W/PHACO  11/01/2011   Procedure: CATARACT EXTRACTION PHACO AND INTRAOCULAR LENS PLACEMENT (IOC);  Surgeon: Tonny Branch, MD;  Location: [facility];  Service: Ophthalmology;  Laterality: Right;  CDE 12.48  . ESOPHAGEAL DILATION N/A 04/29/2017   Procedure: ESOPHAGEAL DILATION;  Surgeon: Rogene Houston, MD;  Location: [facility];  Service: Endoscopy;  Laterality: N/A;  . ESOPHAGOGASTRODUODENOSCOPY N/A 04/29/2017   Procedure: ESOPHAGOGASTRODUODENOSCOPY (EGD);  Surgeon: Rogene Houston, MD;  [facility];  Service: Endoscopy;  Laterality: N/A;  1015  . ESOPHAGOGASTRODUODENOSCOPY (EGD) WITH ESOPHAGEAL DILATION N/A 02/14/2013   Procedure: ESOPHAGOGASTRODUODENOSCOPY (EGD) WITH ESOPHAGEAL DILATION;  Surgeon: Rogene Houston, MD;  [facility];  Service: Endoscopy;  Laterality: N/A;  200-moved to 255 Ann to notify pt  . EYE SURGERY  10-2010   left eye removal of cataract  . TUBAL LIGATION  1984    Family History: Family History  Problem  Relation Age of Onset  . Cancer Mother   . Diabetes Mother   . Cancer Father   . Diabetes Father   . Anesthesia problems Neg Hx   . Hypotension Neg Hx   . Malignant hyperthermia Neg Hx    . Pseudochol deficiency Neg Hx     Social History: Social History   Tobacco Use  Smoking Status Never Smoker  Smokeless Tobacco Never Used   Social History   Substance and Sexual Activity  Alcohol Use No  . Alcohol/week: 0.0 standard drinks   Social History   Substance and Sexual Activity  Drug Use No    Allergies: Allergies  Allergen Reactions  . Ampicillin Hives    Breaks out in bumps Has patient had a PCN reaction causing immediate rash, facial/tongue/throat swelling, SOB or lightheadedness with hypotension: No Has patient had a PCN reaction causing severe rash involving mucus membranes or skin necrosis: Yes Has patient had a PCN reaction that required hospitalization: No Has patient had a PCN reaction occurring within the last 10 years: No If all of the above answers are "NO", then may proceed with Cephalosporin use.   Sharen Hint [Bromfenac Sodium] Itching and Swelling  . Lisinopril Cough  . Tetanus Toxoids Other (See Comments)    Breaks out in bumps    Medications: Current Outpatient Medications  Medication Sig Dispense Refill  . Blood Glucose Monitoring Suppl (ONE TOUCH ULTRA 2) w/Device KIT : Use as instructed 4 x daily 1 kit 0  . calcium carbonate (TUMS - DOSED IN MG ELEMENTAL CALCIUM) 500 MG chewable tablet Chew 1 tablet by mouth at bedtime as needed for indigestion or heartburn.    . clopidogrel (PLAVIX) 75 MG tablet Take 1 tablet (75 mg total) by mouth daily. 90 tablet 3  . dorzolamide-timolol (COSOPT) 22.3-6.8 MG/ML ophthalmic solution Place 1 drop into both eyes 2 (two) times daily.    . Dulaglutide (TRULICITY) 8.84 ZY/6.0YT SOPN Inject 0.75 mg into the skin once a week. 6 mL 3  . ezetimibe (ZETIA) 10 MG tablet Take 10 mg by mouth daily.    Marland Kitchen GLOBAL EASE INJECT PEN NEEDLES 32G X 4 MM MISC USE 4 PENTIPS EVERY DAY 100 each 5  . GLOBAL INJECT EASE LANCETS 30G MISC DIABETIC CLUB 100 each 12  . glucosamine-chondroitin 500-400 MG tablet Take 1 tablet by mouth  daily. 1500 mg    . insulin aspart (NOVOLOG FLEXPEN) 100 UNIT/ML FlexPen Inject 10-15 Units into the skin 3 (three) times daily with meals. (Patient taking differently: Inject 10 Units into the skin 3 (three) times daily with meals.) 15 mL 2  . insulin detemir (LEVEMIR) 100 UNIT/ML FlexPen Inject 44 Units into the skin at bedtime. (Patient taking differently: Inject 42 Units into the skin at bedtime.) 15 mL 0  . latanoprost (XALATAN) 0.005 % ophthalmic solution Place 1 drop into the right eye at bedtime.  11  . losartan (COZAAR) 25 MG tablet Take 25 mg by mouth daily.     . metFORMIN (GLUCOPHAGE-XR) 500 MG 24 hr tablet Take 1 tablet (500 mg total) by mouth daily. 90 tablet 3  . Multiple Vitamin (ONE-A-DAY 55 PLUS PO) Take 1 tablet by mouth daily.     . naproxen sodium (ANAPROX) 220 MG tablet Take 220 mg by mouth daily as needed (pain).    Glory Rosebush ULTRA test strip Use as instructed 4 x daily 100 each 12  . pantoprazole (PROTONIX) 40 MG tablet Take 1 tablet (40 mg total) by  mouth 2 (two) times daily. 180 tablet 3  . simvastatin (ZOCOR) 80 MG tablet Take 80 mg by mouth every evening.     . vitamin C (ASCORBIC ACID) 500 MG tablet Take 500 mg by mouth daily.    . Vitamin D, Cholecalciferol, 50 MCG (2000 UT) CAPS Take by mouth daily.    . vitamin E 180 MG (400 UNITS) capsule Take 400 Units by mouth daily.    . brimonidine (ALPHAGAN) 0.2 % ophthalmic solution Place 1 drop into both eyes 3 (three) times daily.  (Patient not taking: Reported on 01/07/2021)     No current facility-administered medications for this visit.    Review of Systems: GENERAL: negative for malaise, night sweats HEENT: No changes in hearing or vision, no nose bleeds or other nasal problems. NECK: Negative for lumps, goiter, pain and significant neck swelling RESPIRATORY: Negative for cough, wheezing CARDIOVASCULAR: Negative for chest pain, leg swelling, palpitations, orthopnea GI: SEE HPI MUSCULOSKELETAL: Negative for joint  pain or swelling, back pain, and muscle pain. SKIN: Negative for lesions, rash PSYCH: Negative for sleep disturbance, mood disorder and recent psychosocial stressors. HEMATOLOGY Negative for prolonged bleeding, bruising easily, and swollen nodes. ENDOCRINE: Negative for cold or heat intolerance, polyuria, polydipsia and goiter. NEURO: negative for tremor, gait imbalance, syncope and seizures. The remainder of the review of systems is noncontributory.   Physical Exam: BP (!) 153/73 (BP Location: Right Arm, Patient Position: Sitting, Cuff Size: Small)   Pulse 91   Temp 98 F (36.7 C) (Oral)   Ht 4' 11"  (1.499 m)   Wt 127 lb (57.6 kg)   BMI 25.65 kg/m  GENERAL: The patient is AO x3, in no acute distress. HEENT: Head is normocephalic and atraumatic. EOMI are intact. Mouth is well hydrated and without lesions. NECK: Supple. No masses LUNGS: Clear to auscultation. No presence of rhonchi/wheezing/rales. Adequate chest expansion HEART: RRR, normal s1 and s2. ABDOMEN: Soft, nontender, no guarding, no peritoneal signs, and nondistended. BS +. No masses. EXTREMITIES: Without any cyanosis, clubbing, rash, lesions or edema. NEUROLOGIC: AOx3, no focal motor deficit. SKIN: no jaundice, no rashes  Imaging/Labs: as above  I personally reviewed and interpreted the available labs, imaging and endoscopic files.  Impression and Plan: Nancy Sutton is a 75 y.o. 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  The patient has presented with recurrence of her symptoms despite taking her PPI compliantly.  I discussed with her the possibility to switch her back to omeprazole.  I explained to her that even though there is a theoretical interaction between Plavix and PPIs, the current available  literature does not show any compelling evidence supporting this observations.  Furthermore, I explained to her that patients with uncontrolled GERD may have different complications such as her strictures.  Although potential complications include malignancy.  Hence, adequate control of her acid reflux is imperative, for which I will switch her to omeprazole 40 mg every day.  This may be uptitrated to twice a day dosing based on symptom response.  Patient understood and agreed.  She would like to hold off on performing any endoscopic procedures for dysphagia until her follow-up appointment.  - Start omeprazole 40 mg qday - Stop pantoprazole - Explained presumed etiology of reflux symptoms. Instruction provided in the use of antireflux medication - patient should take medication in  the morning 30-45 minutes before eating breakfast. Discussed avoidance of eating within 2 hours of lying down to sleep and benefit of blocks to elevate head of bed. - RTC 3 months  All questions were answered.      Harvel Quale, MD Gastroenterology and Hepatology [facility]

## 2021-01-07 NOTE — Telephone Encounter (Signed)
Patient called states she is having increasing issues with her reflux and dysphagia. She states she had had an appointment in January and felt her husband needed that appointment worse than she did so she gave it to him, and thought she was scheduled for 01/06/2021,but when she arrived here to the office she was told she did not have an appointment. Was given an appointment for August 23,2022 with Dr. Karilyn Cota (first available). She states she is taking Pantoprazole 40 mg bid and clopidogrel 75 mg one po qd and she has a history of a stroke. She takes the pantoprazole one 45 minutes prior to breakfast and the other just prior to supper. She tries not to eat past six pm nightly. She is waking up with acid in throat at night and having to eat tums. She is not sure the pantoprazole is still effective and knows there can be some interactions with her clopidogrel and other ppi's.  Please advise.

## 2021-01-21 ENCOUNTER — Other Ambulatory Visit: Payer: Self-pay

## 2021-01-21 ENCOUNTER — Ambulatory Visit: Payer: Medicare Other | Admitting: Endocrinology

## 2021-01-21 VITALS — BP 144/64 | HR 91 | Ht 59.0 in | Wt 127.6 lb

## 2021-01-21 DIAGNOSIS — E1065 Type 1 diabetes mellitus with hyperglycemia: Secondary | ICD-10-CM | POA: Diagnosis not present

## 2021-01-21 DIAGNOSIS — E1059 Type 1 diabetes mellitus with other circulatory complications: Secondary | ICD-10-CM

## 2021-01-21 DIAGNOSIS — E1165 Type 2 diabetes mellitus with hyperglycemia: Secondary | ICD-10-CM

## 2021-01-21 LAB — POCT GLYCOSYLATED HEMOGLOBIN (HGB A1C): Hemoglobin A1C: 7.5 % — AB (ref 4.0–5.6)

## 2021-01-21 MED ORDER — INSULIN DETEMIR 100 UNIT/ML FLEXPEN: 30.0000 [IU] | PEN_INJECTOR | Freq: Every day | SUBCUTANEOUS | 0 refills | Status: DC

## 2021-01-21 MED ORDER — TRULICITY 1.5 MG/0.5ML ~~LOC~~ SOAJ: 1.5000 mg | SUBCUTANEOUS | 3 refills | Status: DC

## 2021-01-21 MED ORDER — NOVOLOG FLEXPEN 100 UNIT/ML ~~LOC~~ SOPN: 8.0000 [IU] | PEN_INJECTOR | Freq: Three times a day (TID) | SUBCUTANEOUS | 2 refills | Status: DC

## 2021-01-21 NOTE — Patient Instructions (Addendum)
Your blood pressure is high today.  Please see your primary care provider soon, to have it rechecked check your blood sugar 4 times a day: before the 3 meals, and at bedtime.  also check if you have symptoms of your blood sugar being too high or too low.  please keep a record of the readings and bring it to your next appointment here (or you can bring the meter itself).  You can write it on any piece of paper.  please call us sooner if your blood sugar goes below 70, or if you have a lot of readings over 200.   We will need to take this complex situation in stages.   please increase the Trulicity, reduce the Levemir to 30 units at bedtime, and reduce the novolog to 8-12 units 3 times a day (just before each meal).   Please come back for a follow-up appointment in 2 months.

## 2021-01-21 NOTE — Progress Notes (Signed)
Subjective:    Patient ID: Nancy Sutton, female    DOB: [birth year], 75 y.o.   MRN: [id number]  HPI Pt returns for f/u of diabetes mellitus:  DM type: Insulin-requiring type 2.   Dx'ed: 6789 Complications: CVA Therapy: insulin since 3810, Trulicity. and metformin.   GDM: never DKA: never Severe hypoglycemia: never Pancreatitis: never Pancreatic imaging: never SDOH: she cannot afford continuous glucose monitor; she gets insulins from [facility] (pt assist).   Other: she takes multiple daily injections Interval history: she brings a record of her cbg's which I have reviewed today.  cbg varies from 72-349.  there is no trend throughout the day.   Past Medical History:  Diagnosis Date  . Arthritis   . Diabetes mellitus    since 2008  . Essential hypertension, benign 09/10/2015  . GERD (gastroesophageal reflux disease)   . Hypercholesteremia   . Hyperlipidemia   . Stroke ([place]) 03/2011   only deficits-affected eyesight    Past Surgical History:  Procedure Laterality Date  . CATARACT EXTRACTION W/PHACO  11/01/2011   Procedure: CATARACT EXTRACTION PHACO AND INTRAOCULAR LENS PLACEMENT (IOC);  Surgeon: Tonny Branch, MD;  Location: [facility];  Service: Ophthalmology;  Laterality: Right;  CDE 12.48  . ESOPHAGEAL DILATION N/A 04/29/2017   Procedure: ESOPHAGEAL DILATION;  Surgeon: Rogene Houston, MD;  [facility];  Service: Endoscopy;  Laterality: N/A;  . ESOPHAGOGASTRODUODENOSCOPY N/A 04/29/2017   Procedure: ESOPHAGOGASTRODUODENOSCOPY (EGD);  Surgeon: Rogene Houston, MD;  [facility];  Service: Endoscopy;  Laterality: N/A;  1015  . ESOPHAGOGASTRODUODENOSCOPY (EGD) WITH ESOPHAGEAL DILATION N/A 02/14/2013   Procedure: ESOPHAGOGASTRODUODENOSCOPY (EGD) WITH ESOPHAGEAL DILATION;  Surgeon: Rogene Houston, MD;  [facility];  Service: Endoscopy;  Laterality: N/A;  200-moved to 255 Ann to notify pt  . EYE SURGERY  10-2010   left eye removal of cataract  . TUBAL  LIGATION  1984    Social History   Socioeconomic History  . Marital status: Married    Spouse name: wayne  . Number of children: 2  . Years of education: 63  . Highest education level: Not on file  Occupational History  . 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 Smoking status: Never Smoker  . Smokeless tobacco: Never Used  Vaping Use  . Vaping Use: Never used  Substance and Sexual Activity  . Alcohol use: No    Alcohol/week: 0.0 standard drinks  . Drug use: No  . Sexual activity: Yes    Birth control/protection: None  Other Topics Concern  . Not on file  Social History Narrative   Patient is married with 2 children.   Patient is right handed.   Patient has hs education.   Patient drinks 3-5 glasses of tea daily.   Social Determinants of Health   Financial Resource Strain: Not on file  Food Insecurity: Not on file  Transportation Needs: Not on file  Physical Activity: Not on file  Stress: Not on file  Social Connections: Not on file  Intimate Partner Violence: Not on file    Current Outpatient Medications on File Prior to Visit  Medication Sig Dispense Refill  . Blood Glucose Monitoring Suppl (ONE TOUCH ULTRA 2) w/Device KIT : Use as instructed 4 x daily 1 kit 0  . brimonidine (ALPHAGAN) 0.2 % ophthalmic solution Place 1 drop into both eyes 3 (three) times daily.    . calcium carbonate (TUMS -  DOSED IN MG ELEMENTAL CALCIUM) 500 MG chewable tablet Chew 1 tablet by mouth at bedtime as needed for indigestion or heartburn.    . clopidogrel (PLAVIX) 75 MG tablet Take 1 tablet (75 mg total) by mouth daily. 90 tablet 3  . dorzolamide-timolol (COSOPT) 22.3-6.8 MG/ML ophthalmic solution Place 1 drop into both eyes 2 (two) times daily.    Marland Kitchen ezetimibe (ZETIA) 10 MG tablet Take 10 mg by mouth daily.    Marland Kitchen GLOBAL EASE INJECT PEN NEEDLES 32G X 4 MM MISC USE 4 PENTIPS EVERY DAY 100 each 5  . GLOBAL INJECT EASE LANCETS 30G MISC  DIABETIC CLUB 100 each 12  . glucosamine-chondroitin 500-400 MG tablet Take 1 tablet by mouth daily. 1500 mg    . latanoprost (XALATAN) 0.005 % ophthalmic solution Place 1 drop into the right eye at bedtime.  11  . losartan (COZAAR) 25 MG tablet Take 25 mg by mouth daily.     . metFORMIN (GLUCOPHAGE-XR) 500 MG 24 hr tablet Take 1 tablet (500 mg total) by mouth daily. 90 tablet 3  . Multiple Vitamin (ONE-A-DAY 55 PLUS PO) Take 1 tablet by mouth daily.     . naproxen sodium (ANAPROX) 220 MG tablet Take 220 mg by mouth daily as needed (pain).    Marland Kitchen omeprazole (PRILOSEC) 40 MG capsule Take 1 capsule (40 mg total) by mouth daily. 90 capsule 3  . ONETOUCH ULTRA test strip Use as instructed 4 x daily 100 each 12  . simvastatin (ZOCOR) 80 MG tablet Take 80 mg by mouth every evening.     . vitamin C (ASCORBIC ACID) 500 MG tablet Take 500 mg by mouth daily.    . Vitamin D, Cholecalciferol, 50 MCG (2000 UT) CAPS Take by mouth daily.    . vitamin E 180 MG (400 UNITS) capsule Take 400 Units by mouth daily.     No current facility-administered medications on file prior to visit.    Allergies  Allergen Reactions  . Ampicillin Hives    Breaks out in bumps Has patient had a PCN reaction causing immediate rash, facial/tongue/throat swelling, SOB or lightheadedness with hypotension: No Has patient had a PCN reaction causing severe rash involving mucus membranes or skin necrosis: Yes Has patient had a PCN reaction that required hospitalization: No Has patient had a PCN reaction occurring within the last 10 years: No If all of the above answers are "NO", then may proceed with Cephalosporin use.   Sharen Hint [Bromfenac Sodium] Itching and Swelling  . Lisinopril Cough  . Tetanus Toxoids Other (See Comments)    Breaks out in bumps    Family History  Problem Relation Age of Onset  . Cancer Mother   . Diabetes Mother   . Cancer Father   . Diabetes Father   . Anesthesia problems Neg Hx   . Hypotension Neg  Hx   . Malignant hyperthermia Neg Hx   . Pseudochol deficiency Neg Hx     BP (!) 144/64 (BP Location: Right Arm, Patient Position: Sitting, Cuff Size: Large)   Pulse 91   Ht 4' 11"  (1.499 m)   Wt 127 lb 9.6 oz (57.9 kg)   SpO2 97%   BMI 25.77 kg/m    Review of Systems Denies n/v    Objective:   Physical Exam VITAL SIGNS:  See vs page GENERAL: no distress Pulses: dorsalis pedis intact bilat.   MSK: no deformity of the feet CV: no leg edema Skin:  no ulcer on the feet.  normal color and temp on the feet. Neuro: sensation is intact to touch on the feet   Lab Results  Component Value Date   HGBA1C 7.5 (A) 01/21/2021       Assessment & Plan:  Insulin-requiring type 2 DM: uncontrolled.    Patient Instructions  Your blood pressure is high today.  Please see your primary care provider soon, to have it rechecked check your blood sugar 4 times a day: before the 3 meals, and at bedtime.  also check if you have symptoms of your blood sugar being too high or too low.  please keep a record of the readings and bring it to your next appointment here (or you can bring the meter itself).  You can write it on any piece of paper.  please call us sooner if your blood sugar goes below 70, or if you have a lot of readings over 200.   We will need to take this complex situation in stages.   please increase the Trulicity, reduce the Levemir to 30 units at bedtime, and reduce the novolog to 8-12 units 3 times a day (just before each meal).   Please come back for a follow-up appointment in 2 months.

## 2021-02-04 ENCOUNTER — Other Ambulatory Visit: Payer: Self-pay

## 2021-02-04 ENCOUNTER — Ambulatory Visit
Admission: RE | Admit: 2021-02-04 | Discharge: 2021-02-04 | Disposition: A | Discharge status: Home / Self Care | Payer: Medicare Other | Source: Ambulatory Visit | Attending: Internal Medicine | Admitting: Internal Medicine

## 2021-02-04 DIAGNOSIS — Z1231 Encounter for screening mammogram for malignant neoplasm of breast: Secondary | ICD-10-CM

## 2021-02-09 ENCOUNTER — Other Ambulatory Visit: Payer: Self-pay

## 2021-02-09 DIAGNOSIS — E1059 Type 1 diabetes mellitus with other circulatory complications: Secondary | ICD-10-CM

## 2021-02-09 MED ORDER — ONETOUCH ULTRA VI STRP: ORAL_STRIP | 12 refills | Status: DC

## 2021-03-11 ENCOUNTER — Other Ambulatory Visit: Payer: Self-pay | Admitting: Endocrinology

## 2021-03-16 DIAGNOSIS — Z1389 Encounter for screening for other disorder: Secondary | ICD-10-CM | POA: Diagnosis not present

## 2021-03-16 DIAGNOSIS — I6930 Unspecified sequelae of cerebral infarction: Secondary | ICD-10-CM | POA: Diagnosis not present

## 2021-03-16 DIAGNOSIS — E1129 Type 2 diabetes mellitus with other diabetic kidney complication: Secondary | ICD-10-CM | POA: Diagnosis not present

## 2021-03-16 DIAGNOSIS — E559 Vitamin D deficiency, unspecified: Secondary | ICD-10-CM | POA: Diagnosis not present

## 2021-03-16 DIAGNOSIS — E119 Type 2 diabetes mellitus without complications: Secondary | ICD-10-CM | POA: Diagnosis not present

## 2021-03-16 DIAGNOSIS — E538 Deficiency of other specified B group vitamins: Secondary | ICD-10-CM | POA: Diagnosis not present

## 2021-03-16 DIAGNOSIS — E782 Mixed hyperlipidemia: Secondary | ICD-10-CM | POA: Diagnosis not present

## 2021-03-16 DIAGNOSIS — Z0001 Encounter for general adult medical examination with abnormal findings: Secondary | ICD-10-CM | POA: Diagnosis not present

## 2021-03-16 DIAGNOSIS — M1991 Primary osteoarthritis, unspecified site: Secondary | ICD-10-CM | POA: Diagnosis not present

## 2021-03-16 DIAGNOSIS — Z6823 Body mass index (BMI) 23.0-23.9, adult: Secondary | ICD-10-CM | POA: Diagnosis not present

## 2021-03-16 DIAGNOSIS — E1165 Type 2 diabetes mellitus with hyperglycemia: Secondary | ICD-10-CM | POA: Diagnosis not present

## 2021-03-16 DIAGNOSIS — K219 Gastro-esophageal reflux disease without esophagitis: Secondary | ICD-10-CM | POA: Diagnosis not present

## 2021-03-31 ENCOUNTER — Telehealth: Payer: Self-pay | Admitting: Endocrinology

## 2021-03-31 NOTE — Telephone Encounter (Signed)
Pt called to report her Trulicity is no longer covered by her insurance and after talking to her insurance, the covered medication Ozempic is going to still cost her $184 and that is too expensive.  Pt would like to know if it is ok to stick with just her Novolog and Levemir until her upcoming appt on 8/25? If so, pt would like to know if that means to go back on the same dosage she was on prior to the trulicity?  Ph# [phone] can leave a VM if doesn't pick up

## 2021-04-01 NOTE — Telephone Encounter (Signed)
Called and advised pt  of this information

## 2021-04-01 NOTE — Telephone Encounter (Signed)
Pt called and would like to know if we could try to start patient assistance for her Trulicity? She states she tried it at her PCP and they didn't hear anything back for months but pt would like try it with this office instead. Pt states she can come in at any time to give [place] her income information whenever we get this process started.

## 2021-04-02 NOTE — Telephone Encounter (Signed)
Pt called to give an update after speaking with Elease Hashimoto, pt states she called [facility] and was able to speak to a representative to help her finish completing the application Dr.Fusco's office sent in a couple months ago and was approved until December 31st. They will be sending the shipment to Dr.Fusco's office since it is closer to where she lives and she states once December comes it will just have to be renewed.  Pt wanted to thank patricia for all her help and wanted her to know she appreciates looking into this for her.

## 2021-04-02 NOTE — Telephone Encounter (Signed)
Spoke with pt regarding the information that she is requesting for help with her Trulicity. I told her to go online to [facility] Pt assistance program and print of the application and fill it our to its entirety regarding her portion and make sure that we have a copy of her driver license, insurance card, and her income and bring it into the office and we will take care of the rest

## 2021-04-09 ENCOUNTER — Telehealth: Payer: Self-pay

## 2021-04-09 NOTE — Telephone Encounter (Signed)
error 

## 2021-04-09 NOTE — Telephone Encounter (Signed)
Pt called regarding her trulicity medication that was sent in from Dr.Ellison. The pharmacy has told her there was a discrepancy regarding the dosage and the instructions. We had sent for 1.5 units but in the instructions put "inject .75" and they said that doesn't make sense. Pt states that it should be saying inject 1.5 units in place of that. She says we can quickly take care of this by calling the pharmacy at [phone].

## 2021-04-13 NOTE — Telephone Encounter (Signed)
I have spoken with the pt and to the Pharmacy tech at [facility] regarding the Trulicity and did a verbal over the phone and everything has been corrected.

## 2021-04-13 NOTE — Telephone Encounter (Signed)
Pt called regarding the messages below. Pt states the pharmacy will not fill until they hear from [place] regarding the instructions.

## 2021-05-11 ENCOUNTER — Other Ambulatory Visit: Payer: Self-pay | Admitting: Internal Medicine

## 2021-05-11 ENCOUNTER — Other Ambulatory Visit (HOSPITAL_COMMUNITY): Payer: Self-pay | Admitting: Internal Medicine

## 2021-05-11 DIAGNOSIS — I6522 Occlusion and stenosis of left carotid artery: Secondary | ICD-10-CM

## 2021-05-12 ENCOUNTER — Encounter (INDEPENDENT_AMBULATORY_CARE_PROVIDER_SITE_OTHER): Payer: Self-pay | Admitting: Internal Medicine

## 2021-05-12 ENCOUNTER — Other Ambulatory Visit: Payer: Self-pay

## 2021-05-12 ENCOUNTER — Ambulatory Visit (INDEPENDENT_AMBULATORY_CARE_PROVIDER_SITE_OTHER): Payer: Medicare Other | Admitting: Internal Medicine

## 2021-05-12 VITALS — BP 162/76 | HR 88 | Temp 97.9°F | Ht 59.0 in | Wt 120.9 lb

## 2021-05-12 DIAGNOSIS — K222 Esophageal obstruction: Secondary | ICD-10-CM | POA: Diagnosis not present

## 2021-05-12 DIAGNOSIS — K2270 Barrett's esophagus without dysplasia: Secondary | ICD-10-CM | POA: Diagnosis not present

## 2021-05-12 DIAGNOSIS — K219 Gastro-esophageal reflux disease without esophagitis: Secondary | ICD-10-CM | POA: Diagnosis not present

## 2021-05-12 NOTE — Patient Instructions (Signed)
Notify if he experience swallowing difficulty or if omeprazole stops working.

## 2021-05-12 NOTE — Progress Notes (Signed)
Presenting complaint;  Follow for complicated GERD.  Database and subjective:  Patient is 75 year old Caucasian female who has chronic GERD complicated by short segment Barrett's esophagus and distal esophageal stricture.  Last EGD with dilation and biopsy was in August 2018.  She has been maintained on double dose PPI I pantoprazole.  She was doing well when she was last seen by me in July 2021. She began to have frequent heartburn despite taking the medication and watching her diet.  She was seen by Dr. Jenetta Downer 4 months ago and PPI was changed to omeprazole 40 mg daily.  Patient states she is doing much better since she has been on omeprazole.  She rarely has heartburn.  She denies dysphagia hoarseness chronic cough or sore throat.  She does not remember the last time she took Tums.  She watches her diet.  She rates exercise bike for 30 minutes 6 times a week.  She has lost 7 pounds since her last visit.  She is hoping to lose another 10 pounds. Her appetite remains good.  Bowels move daily.  She denies melena or rectal bleeding. She had Cologuard test in January 2022 and was negative. She is not having any side effects with omeprazole. She has osteoarthrosis involving both hands.  She rarely takes Naprosyn during summer and may be more often during winter but not daily.  Current Medications: Outpatient Encounter Medications as of 05/12/2021  Medication Sig   Blood Glucose Monitoring Suppl (ONE TOUCH ULTRA 2) w/Device KIT : Use as instructed 4 x daily   calcium carbonate (TUMS - DOSED IN MG ELEMENTAL CALCIUM) 500 MG chewable tablet Chew 1 tablet by mouth at bedtime as needed for indigestion or heartburn.   clopidogrel (PLAVIX) 75 MG tablet Take 1 tablet (75 mg total) by mouth daily.   dorzolamide-timolol (COSOPT) 22.3-6.8 MG/ML ophthalmic solution Place 1 drop into both eyes 2 (two) times daily.   Dulaglutide (TRULICITY) 1.5 OV/2.9VB SOPN Inject 1.5 mg into the skin once a week.   ezetimibe  (ZETIA) 10 MG tablet Take 10 mg by mouth daily.   GLOBAL EASE INJECT PEN NEEDLES 32G X 4 MM MISC USE 4 PENTIPS EVERY DAY   GLOBAL INJECT EASE LANCETS 30G MISC DIABETIC CLUB   glucosamine-chondroitin 500-400 MG tablet Take 1 tablet by mouth daily. 1500 mg   insulin aspart (NOVOLOG FLEXPEN) 100 UNIT/ML FlexPen Inject 8-10 Units into the skin 3 (three) times daily with meals.   insulin detemir (LEVEMIR) 100 UNIT/ML FlexPen Inject 30 Units into the skin at bedtime.   latanoprost (XALATAN) 0.005 % ophthalmic solution Place 1 drop into the right eye at bedtime.   losartan (COZAAR) 25 MG tablet Take 25 mg by mouth daily.    metFORMIN (GLUCOPHAGE) 500 MG tablet TAKE 1 TABLET BY MOUTH DAILY FOR ONE WEEK, THEN TAKE 1 TABLET TWICE DAILY - needs office visit   metFORMIN (GLUCOPHAGE-XR) 500 MG 24 hr tablet Take 1 tablet (500 mg total) by mouth daily.   Multiple Vitamin (ONE-A-DAY 55 PLUS PO) Take 1 tablet by mouth daily.    naproxen sodium (ANAPROX) 220 MG tablet Take 220 mg by mouth daily as needed (pain).   omeprazole (PRILOSEC) 40 MG capsule Take 1 capsule (40 mg total) by mouth daily.   ONETOUCH ULTRA test strip Test BS 4 x daily   simvastatin (ZOCOR) 80 MG tablet Take 80 mg by mouth every evening.    vitamin C (ASCORBIC ACID) 500 MG tablet Take 500 mg by mouth daily.  Vitamin D, Cholecalciferol, 50 MCG (2000 UT) CAPS Take by mouth daily.   vitamin E 180 MG (400 UNITS) capsule Take 400 Units by mouth daily.   brimonidine (ALPHAGAN) 0.2 % ophthalmic solution Place 1 drop into both eyes 3 (three) times daily. (Patient not taking: Reported on 05/12/2021)   No facility-administered encounter medications on file as of 05/12/2021.    Objective: Blood pressure (!) 162/76, pulse 88, temperature 97.9 F (36.6 C), temperature source Oral, height _0  (1.499 m), weight 120 lb 14.4 oz (54.8 kg). Patient is alert and in no acute distress. Conjunctiva is pink. Sclera is nonicteric Oropharyngeal mucosa is  normal. No neck masses or thyromegaly noted. Cardiac exam with regular rhythm normal S1 and S2. No murmur or gallop noted. Lungs are clear to auscultation. Abdomen is soft and nontender without organomegaly or masses. No LE edema or clubbing noted.  Labs/studies Results:   CBC Latest Ref Rng & Units 02/13/2014 11/25/2012 10/26/2011  WBC 4.0 - 10.5 K/uL 10.9(H) 9.6 -  Hemoglobin 12.0 - 15.0 g/dL 11.3(L) 11.6(L) 10.8(L)  Hematocrit 36.0 - 46.0 % 33.4(L) 34.4(L) 32.2(L)  Platelets 150 - 400 K/uL 225 246 -    CMP Latest Ref Rng & Units 10/20/2020 07/20/2019 04/17/2019  Glucose 70 - 99 mg/dL 222(H) 110(H) 266(H)  BUN 6 - 23 mg/dL 28(H) 25 27(H)  Creatinine 0.40 - 1.20 mg/dL 0.86 0.82 0.95(H)  Sodium 135 - 145 mEq/L 138 141 140  Potassium 3.5 - 5.1 mEq/L 4.1 4.1 4.5  Chloride 96 - 112 mEq/L 104 107 105  CO2 19 - 32 mEq/L _1 Calcium 8.4 - 10.5 mg/dL 10.3 9.6 10.0  Total Protein 6.1 - 8.1 g/dL - 7.0 6.9  Total Bilirubin 0.2 - 1.2 mg/dL - 0.5 0.6  Alkaline Phos 33 - 130 U/L - - -  AST 10 - 35 U/L - 13 13  ALT 6 - 29 U/L - 12 11    Hepatic Function Latest Ref Rng & Units 07/20/2019 04/17/2019 11/29/2018  Total Protein 6.1 - 8.1 g/dL 7.0 6.9 7.1  Albumin 3.6 - 5.1 g/dL - - -  AST 10 - 35 U/L _2 ALT 6 - 29 U/L _3 Alk Phosphatase 33 - 130 U/L - - -  Total Bilirubin 0.2 - 1.2 mg/dL 0.5 0.6 0.5     Assessment:  #1.  Chronic GERD complicated by esophageal stricture and short segment Barrett's esophagus.  Last EGD with dilation was in August 2018 and esophageal biopsy was negative for dysplasia.  She is doing well with antireflux measures and 40 mg of omeprazole daily.  She developed tachyphylaxis with pantoprazole. Would consider surveillance EGD in 1 year.  #2.  Patient is average risk for CRC.  Cologuard test was negative in January this year by PCP.   Plan:  Continue antireflux measures as before. Continue omeprazole at a dose of 40 mg p.o. every morning.  She takes  it 30 minutes before breakfast. Patient will call if omeprazole stops working or if she has dysphagia. Office visit in 1 year.

## 2021-05-14 ENCOUNTER — Other Ambulatory Visit: Payer: Self-pay

## 2021-05-14 ENCOUNTER — Ambulatory Visit (INDEPENDENT_AMBULATORY_CARE_PROVIDER_SITE_OTHER): Payer: Medicare Other | Admitting: Endocrinology

## 2021-05-14 ENCOUNTER — Telehealth: Payer: Self-pay | Admitting: Endocrinology

## 2021-05-14 VITALS — BP 172/74 | HR 90 | Ht 59.0 in | Wt 119.6 lb

## 2021-05-14 DIAGNOSIS — E1059 Type 1 diabetes mellitus with other circulatory complications: Secondary | ICD-10-CM | POA: Diagnosis not present

## 2021-05-14 LAB — POCT GLYCOSYLATED HEMOGLOBIN (HGB A1C): Hemoglobin A1C: 6.7 % — AB (ref 4.0–5.6)

## 2021-05-14 MED ORDER — TRULICITY 3 MG/0.5ML ~~LOC~~ SOAJ: 3.0000 mg | SUBCUTANEOUS | 3 refills | Status: DC

## 2021-05-14 MED ORDER — INSULIN DETEMIR 100 UNIT/ML FLEXPEN: 15.0000 [IU] | PEN_INJECTOR | Freq: Every day | SUBCUTANEOUS | 0 refills | Status: DC

## 2021-05-14 NOTE — Telephone Encounter (Signed)
Pt called and stated she wanted her TRULICITY sent to [facility] and not the [facility].

## 2021-05-14 NOTE — Patient Instructions (Addendum)
Your blood pressure is high today.  Please see your primary care provider soon, to have it rechecked check your blood sugar 4 times a day: before the 3 meals, and at bedtime.  also check if you have symptoms of your blood sugar being too high or too low.  please keep a record of the readings and bring it to your next appointment here (or you can bring the meter itself).  You can write it on any piece of paper.  please call us sooner if your blood sugar goes below 70, or if you have a lot of readings over 200.   We will need to take this complex situation in stages.   I have sent a prescription to your pharmacy, to increase the Trulicity, reduce the Levemir to 15 units at bedtime, and continue the novolog to 8-12 units 3 times a day (just before each meal).   Please come back for a follow-up appointment in 2-3 months.

## 2021-05-14 NOTE — Progress Notes (Signed)
Subjective:    Patient ID: Nancy Sutton, female    DOB: 06-13-1946, 75 y.o.   MRN: [id number]  HPI Pt returns for f/u of diabetes mellitus:  DM type: Insulin-requiring type 2.   Dx'ed: 6203 Complications: CVA Therapy: insulin since 5597, Trulicity, and metformin.   GDM: never DKA: never Severe hypoglycemia: never Pancreatitis: never Pancreatic imaging: never SDOH: she cannot afford continuous glucose monitor; she gets insulins from [facility] (pt assist).   Other: she takes multiple daily injections Interval history: she brings a record of her cbg's which I have reviewed today.  cbg varies from 68-287.  It is in general higher as the day goes on.   Past Medical History:  Diagnosis Date   Arthritis    Diabetes mellitus    since 2008   Essential hypertension, benign 09/10/2015   GERD (gastroesophageal reflux disease)    Hypercholesteremia    Hyperlipidemia    Stroke ([place]) 03/2011   only deficits-affected eyesight    Past Surgical History:  Procedure Laterality Date   CATARACT EXTRACTION W/PHACO  11/01/2011   Procedure: CATARACT EXTRACTION PHACO AND INTRAOCULAR LENS PLACEMENT ([place]);  Surgeon: Tonny Branch, MD;  Location: [facility];  Service: Ophthalmology;  Laterality: Right;  CDE 12.48   ESOPHAGEAL DILATION N/A 04/29/2017   Procedure: ESOPHAGEAL DILATION;  Surgeon: Rogene Houston, MD;  Location: [facility];  Service: Endoscopy;  Laterality: N/A;   ESOPHAGOGASTRODUODENOSCOPY N/A 04/29/2017   Procedure: ESOPHAGOGASTRODUODENOSCOPY (EGD);  Surgeon: Rogene Houston, MD;  Location: [facility];  Service: Endoscopy;  Laterality: N/A;  1015   ESOPHAGOGASTRODUODENOSCOPY (EGD) WITH ESOPHAGEAL DILATION N/A 02/14/2013   Procedure: ESOPHAGOGASTRODUODENOSCOPY (EGD) WITH ESOPHAGEAL DILATION;  Surgeon: Rogene Houston, MD;  Location: [facility];  Service: Endoscopy;  Laterality: N/A;  200-moved to 10 Ann to notify pt   EYE SURGERY  10-2010   left eye removal of cataract   TUBAL LIGATION  1984     Social History   Socioeconomic History   Marital status: Married    Spouse name: wayne   Number of children: 2   Years of education: 12   Highest education level: Not on file  Occupational History   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   Patient is right handed.   Patient has hs education.   Patient drinks 3-5 glasses of tea daily.   Social Determinants of Health   Financial Resource Strain: Not on file  Food Insecurity: Not on file  Transportation Needs: Not on file  Physical Activity: Not on file  Stress: Not on file  Social Connections: Not on file  Intimate Partner Violence: Not on file    Current Outpatient Medications on File Prior to Visit  Medication Sig Dispense Refill   Blood Glucose Monitoring Suppl (ONE TOUCH ULTRA 2) w/Device KIT : Use as instructed 4 x daily 1 kit 0   brimonidine (ALPHAGAN) 0.2 % ophthalmic solution Place 1 drop into both eyes 3 (three) times daily.     calcium carbonate (TUMS -  DOSED IN MG ELEMENTAL CALCIUM) 500 MG chewable tablet Chew 1 tablet by mouth at bedtime as needed for indigestion or heartburn.     clopidogrel (PLAVIX) 75 MG tablet Take 1 tablet (75 mg total) by mouth daily. 90 tablet 3   dorzolamide-timolol (COSOPT) 22.3-6.8 MG/ML ophthalmic solution Place 1 drop into both eyes 2 (two) times daily.     ezetimibe (ZETIA) 10 MG tablet Take 10 mg by mouth daily.     GLOBAL EASE INJECT PEN NEEDLES 32G X 4 MM MISC USE 4 PENTIPS EVERY DAY 100 each 5   GLOBAL INJECT EASE LANCETS 30G MISC DIABETIC CLUB 100 each 12   glucosamine-chondroitin  500-400 MG tablet Take 1 tablet by mouth daily. 1500 mg     insulin aspart (NOVOLOG FLEXPEN) 100 UNIT/ML FlexPen Inject 8-10 Units into the skin 3 (three) times daily with meals. 15 mL 2   latanoprost (XALATAN) 0.005 % ophthalmic solution Place 1 drop into the right eye at bedtime.  11   losartan (COZAAR) 25 MG tablet Take 25 mg by mouth daily.      metFORMIN (GLUCOPHAGE-XR) 500 MG 24 hr tablet Take 1 tablet (500 mg total) by mouth daily. 90 tablet 3   Multiple Vitamin (ONE-A-DAY 55 PLUS PO) Take 1 tablet by mouth daily.      naproxen sodium (ANAPROX) 220 MG tablet Take 220 mg by mouth daily as needed (pain).     omeprazole (PRILOSEC) 40 MG capsule Take 1 capsule (40 mg total) by mouth daily. 90 capsule 3   ONETOUCH ULTRA test strip Test BS 4 x daily 400 each 12   simvastatin (ZOCOR) 80 MG tablet Take 80 mg by mouth every evening.      vitamin C (ASCORBIC ACID) 500 MG tablet Take 500 mg by mouth daily.     Vitamin D, Cholecalciferol, 50 MCG (2000 UT) CAPS Take by mouth daily.     vitamin E 180 MG (400 UNITS) capsule Take 400 Units by mouth daily.     No current facility-administered medications on file prior to visit.    Allergies  Allergen Reactions   Ampicillin Hives    Breaks out in bumps Has patient had a PCN reaction causing immediate rash, facial/tongue/throat swelling, SOB or lightheadedness with hypotension: No Has patient had a PCN reaction causing severe rash involving mucus membranes or skin necrosis: Yes Has patient had a PCN reaction that required hospitalization: No Has patient had a PCN reaction occurring within the last 10 years: No If all of the above answers are "NO", then may proceed with Cephalosporin use.    Bromday [Bromfenac Sodium] Itching and Swelling   Lisinopril Cough   Tetanus Toxoids Other (See Comments)    Breaks out in bumps    Family History  Problem Relation Age of Onset   Cancer Mother    Diabetes Mother    Cancer Father    Diabetes Father     Anesthesia problems Neg Hx    Hypotension Neg Hx    Malignant hyperthermia Neg Hx    Pseudochol deficiency Neg Hx     BP (!) 172/74 (BP Location: Right Arm, Patient Position: Sitting, Cuff Size: Normal)   Pulse 90   Ht 4' 11"  (1.499 m)   Wt 119 lb 9.6 oz (54.3 kg)   SpO2 97%   BMI 24.16 kg/m    Review of Systems Denies n/v.  HB is well-controlled.      Objective:   Physical Exam Pulses: dorsalis pedis intact  bilat.   MSK: no deformity of the feet CV: no leg edema Skin:  no ulcer on the feet.  normal color and temp on the feet. Neuro: sensation is intact to touch on the feet   A1c=6.7%    Assessment & Plan:  Insulin-requiring type 2 DM.   Hypoglycemia, due to insulin: we'll favor GLP rx.    Patient Instructions  Your blood pressure is high today.  Please see your primary care provider soon, to have it rechecked check your blood sugar 4 times a day: before the 3 meals, and at bedtime.  also check if you have symptoms of your blood sugar being too high or too low.  please keep a record of the readings and bring it to your next appointment here (or you can bring the meter itself).  You can write it on any piece of paper.  please call us sooner if your blood sugar goes below 70, or if you have a lot of readings over 200.   We will need to take this complex situation in stages.   I have sent a prescription to your pharmacy, to increase the Trulicity, reduce the Levemir to 15 units at bedtime, and continue the novolog to 8-12 units 3 times a day (just before each meal).   Please come back for a follow-up appointment in 2-3 months.

## 2021-05-15 ENCOUNTER — Telehealth: Payer: Self-pay | Admitting: Endocrinology

## 2021-05-15 NOTE — Telephone Encounter (Signed)
Pt calling in voiced that, we had faxed paperwork to [facility] told patient the page 5 and 6 was not filled out. Pt is requesting it to be refaxed. Pt would like a call back once it is completed

## 2021-05-15 NOTE — Telephone Encounter (Signed)
Patient called re: Disregard previous message regarding Blair Heys not Fax paperwork to [employer]  Also, Patient requests to be called at ph# [phone] re: Patient has questions about reapplying for Patient Assistance in the Fall (e.g. Can Patient bring in W2's, tax returns, etc to Dr. Everardo All for him to complete the application)?  If no answer, Patient requests a detailed message be left on voice mail at the ph# listed above.

## 2021-05-18 ENCOUNTER — Telehealth: Payer: Self-pay | Admitting: Endocrinology

## 2021-05-18 NOTE — Telephone Encounter (Signed)
Spoke with pt and waiting on response from [facility]

## 2021-05-18 NOTE — Telephone Encounter (Signed)
Pt stated that when she was taking the 0.75 mg of Trulicity she did not have any problems.Now that she is taking 1.5mg  of the Trulicity she is experiencing diarrhea.  Please Advise

## 2021-05-18 NOTE — Telephone Encounter (Signed)
Patient has an appointment 07/14/21 - patient calling today regarding having diarrhea since Trulicity dosage increased.  Wants to know if she should continue with next dosage increase or not?  Call patient at [phone] - OK to leave message if no answer

## 2021-05-18 NOTE — Telephone Encounter (Signed)
Spoke with pt in regards to reducing her Trulicity.

## 2021-05-20 DIAGNOSIS — I6522 Occlusion and stenosis of left carotid artery: Secondary | ICD-10-CM | POA: Diagnosis not present

## 2021-06-10 ENCOUNTER — Telehealth: Payer: Self-pay | Admitting: Endocrinology

## 2021-06-10 NOTE — Telephone Encounter (Signed)
Paperwork for assistance  some information incorrect. [address] Address [address], [address], Zip code is [address]. And lastly Does the prescription have coverage? Yes, Medicare Advantage. Any questions please call patient [phone] Please leave vm if no answer 

## 2021-06-10 NOTE — Telephone Encounter (Signed)
Patient called to find out if there are alternate placement sites for [facility] - does not care to wear it on the back of her arm.  Patient requesting a call back to [phone] - OK to leave detailed VM

## 2021-06-15 DIAGNOSIS — H401131 Primary open-angle glaucoma, bilateral, mild stage: Secondary | ICD-10-CM | POA: Diagnosis not present

## 2021-06-30 ENCOUNTER — Telehealth: Payer: Self-pay | Admitting: Endocrinology

## 2021-06-30 NOTE — Telephone Encounter (Signed)
Spoke with pt regarding her questions/concerns with her pt assist forms. I advised her that once we here from them then she will be contacted.

## 2021-06-30 NOTE — Telephone Encounter (Signed)
Paperwork for assistance  some information incorrect. [address] Address is [address], not [address], Zip code is [phone]. And lastly Does the prescription have coverage? Yes, Medicare Advantage. Any questions please call patient [phone] Please leave vm if no answer

## 2021-07-14 ENCOUNTER — Other Ambulatory Visit: Payer: Self-pay

## 2021-07-14 ENCOUNTER — Ambulatory Visit (INDEPENDENT_AMBULATORY_CARE_PROVIDER_SITE_OTHER): Payer: Medicare Other | Admitting: Endocrinology

## 2021-07-14 VITALS — BP 160/70 | HR 89 | Ht 59.0 in | Wt 121.8 lb

## 2021-07-14 DIAGNOSIS — E1059 Type 1 diabetes mellitus with other circulatory complications: Secondary | ICD-10-CM | POA: Diagnosis not present

## 2021-07-14 MED ORDER — INSULIN DETEMIR 100 UNIT/ML FLEXPEN: 30.0000 [IU] | PEN_INJECTOR | Freq: Every day | SUBCUTANEOUS | 3 refills | Status: DC

## 2021-07-14 MED ORDER — TRULICITY 1.5 MG/0.5ML ~~LOC~~ SOAJ: 1.5000 mg | SUBCUTANEOUS | 3 refills | Status: DC

## 2021-07-14 NOTE — Patient Instructions (Addendum)
Your blood pressure is high today.  Please see your primary care provider soon, to have it rechecked.   check your blood sugar 4 times a day: before the 3 meals, and at bedtime.  also check if you have symptoms of your blood sugar being too high or too low.  please keep a record of the readings and bring it to your next appointment here (or you can bring the meter itself).  You can write it on any piece of paper.  please call us sooner if your blood sugar goes below 70, or if you have a lot of readings over 200.   Please come back for a follow-up appointment in 3 months.

## 2021-07-14 NOTE — Progress Notes (Signed)
Subjective:    Patient ID: Nancy Sutton, female    DOB: June 14, 1946, 75 y.o.   MRN: [id number]  HPI Pt returns for f/u of diabetes mellitus:  DM type: Insulin-requiring type 2.   Dx'ed: 0174 Complications: CVA Therapy: insulin since 9449, Trulicity, and metformin.   GDM: never DKA: never Severe hypoglycemia: never Pancreatitis: never Pancreatic imaging: never SDOH: she cannot afford continuous glucose monitor; she gets insulins from [employer] (pt assist).   Other: she takes multiple daily injections.  Interval history: she brings a record of her cbg's which I have reviewed today.  cbg varies from 84-266.  It is in general higher as the day goes on.  She did not increase the Trulicity, due to diarrhea, which persists.  She seldom has hypoglycemia, and these episodes are mild.   Past Medical History:  Diagnosis Date   Arthritis    Diabetes mellitus    since 2008   Essential hypertension, benign 09/10/2015   GERD (gastroesophageal reflux disease)    Hypercholesteremia    Hyperlipidemia    Stroke ([place]) 03/2011   only deficits-affected eyesight    Past Surgical History:  Procedure Laterality Date   CATARACT EXTRACTION W/PHACO  11/01/2011   Procedure: CATARACT EXTRACTION PHACO AND INTRAOCULAR LENS PLACEMENT ([place]);  Surgeon: Tonny Branch, MD;  Location: [facility];  Service: Ophthalmology;  Laterality: Right;  CDE 12.48   ESOPHAGEAL DILATION N/A 04/29/2017   Procedure: ESOPHAGEAL DILATION;  Surgeon: Rogene Houston, MD;  Location: [facility];  Service: Endoscopy;  Laterality: N/A;   ESOPHAGOGASTRODUODENOSCOPY N/A 04/29/2017   Procedure: ESOPHAGOGASTRODUODENOSCOPY (EGD);  Surgeon: Rogene Houston, MD;  Location: [facility];  Service: Endoscopy;  Laterality: N/A;  1015   ESOPHAGOGASTRODUODENOSCOPY (EGD) WITH ESOPHAGEAL DILATION N/A 02/14/2013   Procedure: ESOPHAGOGASTRODUODENOSCOPY (EGD) WITH ESOPHAGEAL DILATION;  Surgeon: Rogene Houston, MD;  Location: [facility];  Service: Endoscopy;   Laterality: N/A;  200-moved to 54 Ann to notify pt   EYE SURGERY  10-2010   left eye removal of cataract   TUBAL LIGATION  1984    Social History   Socioeconomic History   Marital status: Married    Spouse name: wayne   Number of children: 2   Years of education: 12   Highest education level: Not on file  Occupational History   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   Patient is right handed.   Patient has hs education.   Patient drinks 3-5 glasses of tea daily.   Social Determinants of Health   Financial Resource Strain: Not on file  Food Insecurity: Not on file  Transportation Needs: Not on file  Physical Activity: Not on file  Stress: Not on file  Social Connections: Not on file  Intimate Partner Violence: Not on file    Current Outpatient Medications on File Prior to Visit  Medication Sig Dispense Refill   Blood Glucose Monitoring Suppl (ONE TOUCH ULTRA 2) w/Device KIT : Use as instructed 4 x daily 1 kit 0  brimonidine (ALPHAGAN) 0.2 % ophthalmic solution Place 1 drop into both eyes 3 (three) times daily.     calcium carbonate (TUMS - DOSED IN MG ELEMENTAL CALCIUM) 500 MG chewable tablet Chew 1 tablet by mouth at bedtime as needed for indigestion or heartburn.     clopidogrel (PLAVIX) 75 MG tablet Take 1 tablet (75 mg total) by mouth daily. 90 tablet 3   dorzolamide-timolol (COSOPT) 22.3-6.8 MG/ML ophthalmic solution Place 1 drop into both eyes 2 (two) times daily.     ezetimibe (ZETIA) 10 MG tablet Take 10 mg by mouth daily.     GLOBAL EASE INJECT PEN NEEDLES 32G X 4 MM  MISC USE 4 PENTIPS EVERY DAY 100 each 5   GLOBAL INJECT EASE LANCETS 30G MISC DIABETIC CLUB 100 each 12   glucosamine-chondroitin 500-400 MG tablet Take 1 tablet by mouth daily. 1500 mg     insulin aspart (NOVOLOG FLEXPEN) 100 UNIT/ML FlexPen Inject 8-10 Units into the skin 3 (three) times daily with meals. 15 mL 2   latanoprost (XALATAN) 0.005 % ophthalmic solution Place 1 drop into the right eye at bedtime.  11   losartan (COZAAR) 25 MG tablet Take 25 mg by mouth daily.      metFORMIN (GLUCOPHAGE-XR) 500 MG 24 hr tablet Take 1 tablet (500 mg total) by mouth daily. 90 tablet 3   Multiple Vitamin (ONE-A-DAY 55 PLUS PO) Take 1 tablet by mouth daily.      naproxen sodium (ANAPROX) 220 MG tablet Take 220 mg by mouth daily as needed (pain).     omeprazole (PRILOSEC) 40 MG capsule Take 1 capsule (40 mg total) by mouth daily. 90 capsule 3   ONETOUCH ULTRA test strip Test BS 4 x daily 400 each 12   simvastatin (ZOCOR) 80 MG tablet Take 80 mg by mouth every evening.      vitamin C (ASCORBIC ACID) 500 MG tablet Take 500 mg by mouth daily.     Vitamin D, Cholecalciferol, 50 MCG (2000 UT) CAPS Take by mouth daily.     vitamin E 180 MG (400 UNITS) capsule Take 400 Units by mouth daily.     No current facility-administered medications on file prior to visit.    Allergies  Allergen Reactions   Ampicillin Hives    Breaks out in bumps Has patient had a PCN reaction causing immediate rash, facial/tongue/throat swelling, SOB or lightheadedness with hypotension: No Has patient had a PCN reaction causing severe rash involving mucus membranes or skin necrosis: Yes Has patient had a PCN reaction that required hospitalization: No Has patient had a PCN reaction occurring within the last 10 years: No If all of the above answers are "NO", then may proceed with Cephalosporin use.    Bromday [Bromfenac Sodium] Itching and Swelling   Lisinopril Cough   Tetanus Toxoids Other (See Comments)    Breaks out in bumps     Family History  Problem Relation Age of Onset   Cancer Mother    Diabetes Mother    Cancer Father    Diabetes Father    Anesthesia problems Neg Hx    Hypotension Neg Hx    Malignant hyperthermia Neg Hx    Pseudochol deficiency Neg Hx     BP (!) 160/70 (BP Location: Right Arm, Patient Position: Sitting, Cuff Size: Normal)   Pulse 89   Ht 4' 11"  (1.499 m)   Wt 121 lb 12.8 oz (55.2 kg)   SpO2 97%   BMI 24.60 kg/m  Review of Systems     Objective:   Physical Exam   Lab Results  Component Value Date   CREATININE 0.86 10/20/2020   BUN 28 (H) 10/20/2020   NA 138 10/20/2020   K 4.1 10/20/2020   CL 104 10/20/2020   CO2 27 10/20/2020   Lab Results  Component Value Date   HGBA1C 6.7 (A) 05/14/2021      Assessment & Plan:  Insulin-requiring type 2 DM.  Diarrhea, due to Trulicity. We discussed.  She declines to reduce.    Patient Instructions  Your blood pressure is high today.  Please see your primary care provider soon, to have it rechecked.   check your blood sugar 4 times a day: before the 3 meals, and at bedtime.  also check if you have symptoms of your blood sugar being too high or too low.  please keep a record of the readings and bring it to your next appointment here (or you can bring the meter itself).  You can write it on any piece of paper.  please call us sooner if your blood sugar goes below 70, or if you have a lot of readings over 200.   Please come back for a follow-up appointment in 3 months.

## 2021-07-15 ENCOUNTER — Telehealth: Payer: Self-pay | Admitting: Endocrinology

## 2021-07-15 NOTE — Telephone Encounter (Signed)
Patient called to advise that she called and spoke with Reva with [employer] Patient Assistance. Tiburcio Pea advises that the paper work they received was submitted too early for it to be approved.  [facility] advises that if we would resubmit the paperwork now with current dates and most current data that it can be processed and patient assistance processed.  Patient call back number for clarification, etc - [phone]

## 2021-07-15 NOTE — Telephone Encounter (Signed)
Patient called re: [employer] has not received Application form for [employer]. Patient states that [employer] faxed another application to Dr. Everardo All today. Patient requests the above application be filled out and faxed back to [employer] for 2023.  Please call Patient if any questions.

## 2021-07-16 NOTE — Telephone Encounter (Signed)
LVM for pt to let her know that I received her papers from [employer] and she will need to come by the office to pick up the papers and fill out all of her portion/sign and return them with updated documents like income and copy of license.  Papers has been place up front in the accordium

## 2021-07-17 ENCOUNTER — Telehealth: Payer: Self-pay | Admitting: Endocrinology

## 2021-07-17 NOTE — Telephone Encounter (Signed)
Pt has already given income verification for 2021. 2022 taxes/income are not ready until next tax season. What more income verification is needed besides the social security and [employer] paperwork? Pt would like a voicemail sent and she'll be by in the afternoon to fill out paperwork and drop off verification needed.   Pt contact [phone]

## 2021-07-24 NOTE — Telephone Encounter (Signed)
Patient called to check status on paperwork for [employer].  Requested  a call when paper work is sent to [employer].   [facility] Fax # [phone]

## 2021-07-28 ENCOUNTER — Telehealth: Payer: Self-pay

## 2021-07-28 NOTE — Telephone Encounter (Signed)
Per pt, [employer] claims we dated pages 2 &4 with May dates and we need to update with recent November dates, update all dates to recent and refax to [employer]: [phone]

## 2021-07-28 NOTE — Telephone Encounter (Signed)
Pt has been notified to come to office and sign papers from [employer]. Paper has been place up front in the accordion folder

## 2021-08-11 ENCOUNTER — Telehealth: Payer: Self-pay | Admitting: Endocrinology

## 2021-08-11 NOTE — Telephone Encounter (Signed)
Per last office visit and rx sent October 2022 pt is on the 1.5 weekly injection for Trulicity. Pt's correct rx was re faxed to [employer].

## 2021-08-18 ENCOUNTER — Telehealth: Payer: Self-pay

## 2021-08-18 NOTE — Telephone Encounter (Signed)
Nancy Sutton has received Trulicity 3.0 in the mail. Patient wanted to know if she can take it.   Also if you will refax the 1.5 dose back to the pharmacy. Because she stated they have not received it as of today.   Patient want to know if you will please call her. Thank you

## 2021-08-19 ENCOUNTER — Telehealth: Payer: Self-pay | Admitting: Endocrinology

## 2021-08-19 NOTE — Telephone Encounter (Signed)
PT called concerning Dulaglutide (TRULICITY) 1.5 MG/0.5ML SOPN [[id number]]  and the dosage to be taken. Pharmacy filled at 3 MG and Dr. Everardo All instructed her to take 1.5 MG. Please call her at [phone]. A message may be left on answer machine.

## 2021-08-20 ENCOUNTER — Other Ambulatory Visit: Payer: Self-pay | Admitting: Endocrinology

## 2021-08-20 MED ORDER — TRULICITY 3 MG/0.5ML ~~LOC~~ SOAJ: 3.0000 mg | SUBCUTANEOUS | 3 refills | Status: DC

## 2021-08-20 NOTE — Telephone Encounter (Signed)
Spoke with pt to let her know that per Everardo All she is to take 3mg  of the Trulicity and it has been updated on med list

## 2021-08-21 ENCOUNTER — Other Ambulatory Visit: Payer: Self-pay | Admitting: Endocrinology

## 2021-08-21 MED ORDER — INSULIN DETEMIR 100 UNIT/ML FLEXPEN: 24.0000 [IU] | PEN_INJECTOR | Freq: Every day | SUBCUTANEOUS | 3 refills | Status: DC

## 2021-08-21 MED ORDER — NOVOLOG FLEXPEN 100 UNIT/ML ~~LOC~~ SOPN: 6.0000 [IU] | PEN_INJECTOR | Freq: Three times a day (TID) | SUBCUTANEOUS | 2 refills | Status: DC

## 2021-08-21 NOTE — Telephone Encounter (Signed)
Spoke with pt to give her update instruction on the Novolog and levermir per [employer]

## 2021-09-12 IMAGING — MG DIGITAL DIAGNOSTIC BILAT W/ TOMO W/ CAD
9 of 16 series · 9 of 32 positions shown · non-contrast
Comparison: Previous exam(s).

CLINICAL DATA: 74-year-old female presenting for final follow-up of
bilateral likely benign calcifications.

EXAM:
DIGITAL DIAGNOSTIC BILATERAL MAMMOGRAM WITH CAD AND TOMO

[R CC (1 of 2)]
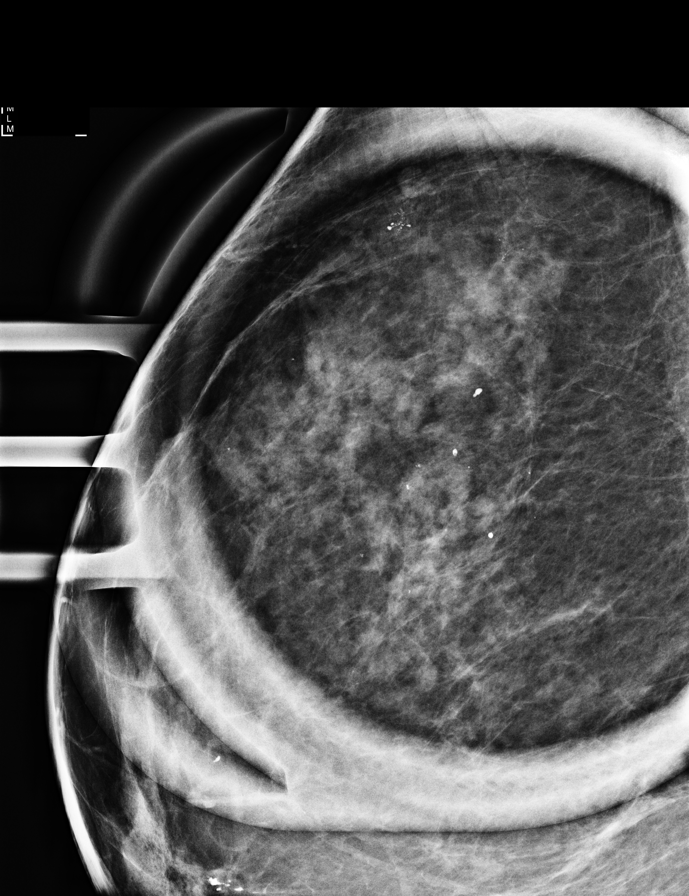

[L ML (1 of 2)]
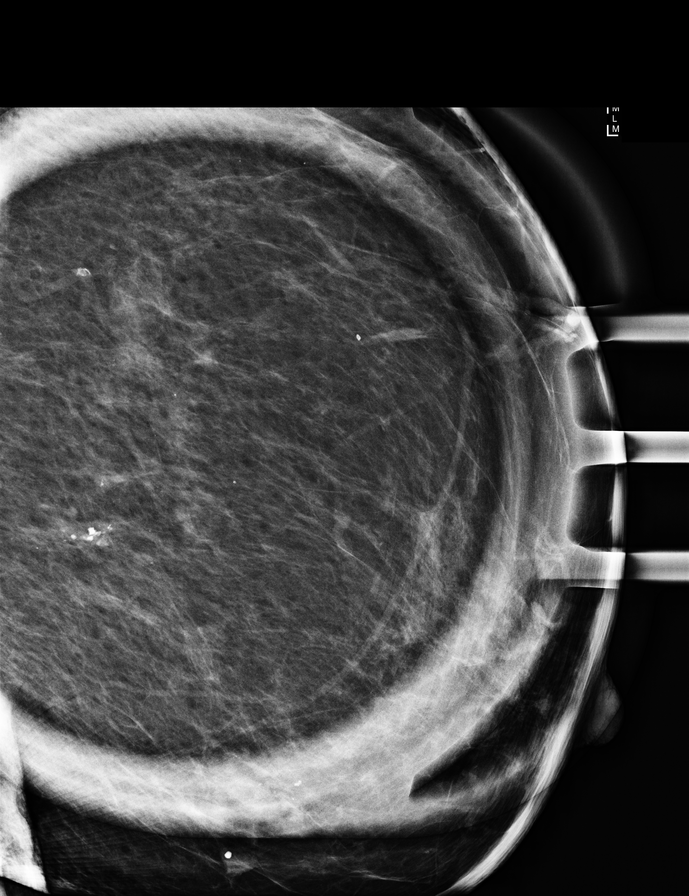

[R ML (1 of 2)]
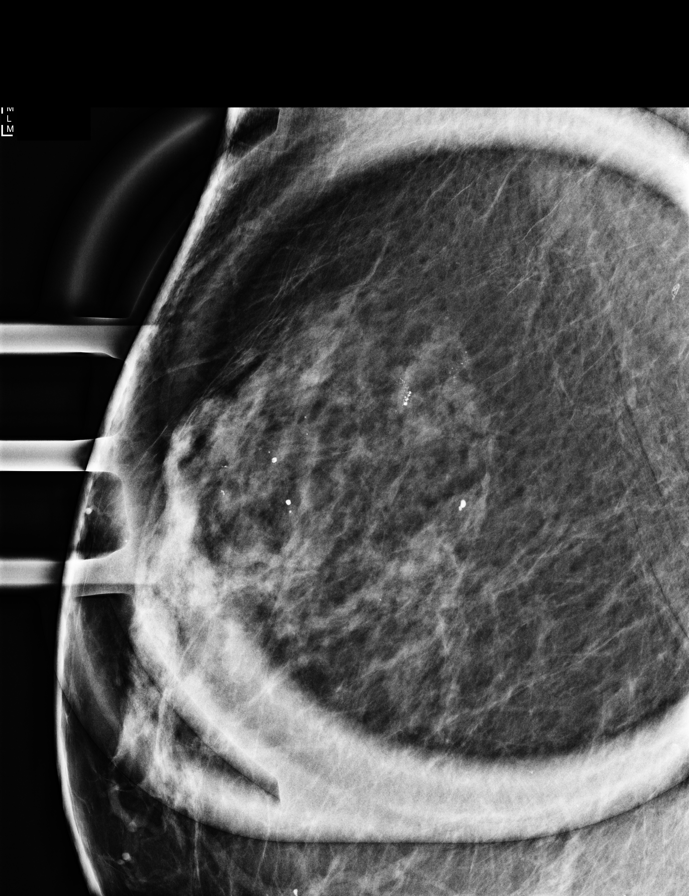

[L CC (1 of 2)]
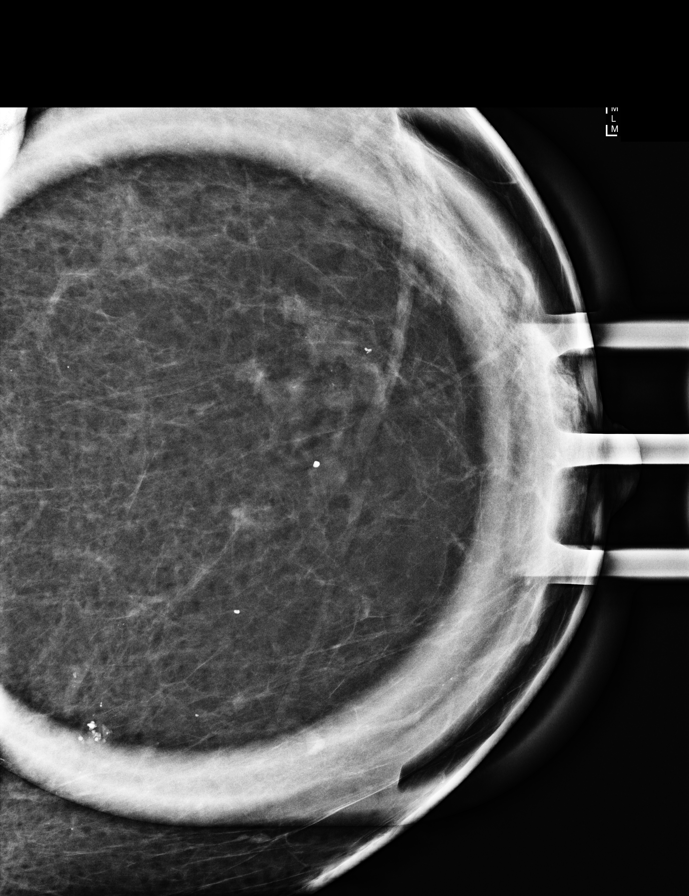

[R CC (2 of 2)]
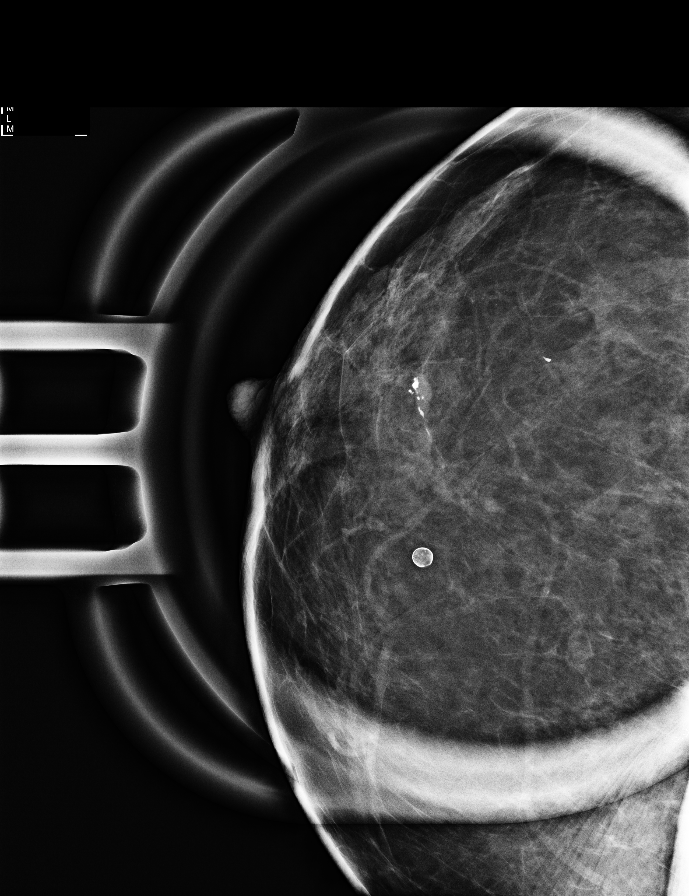

[R ML (2 of 2)]
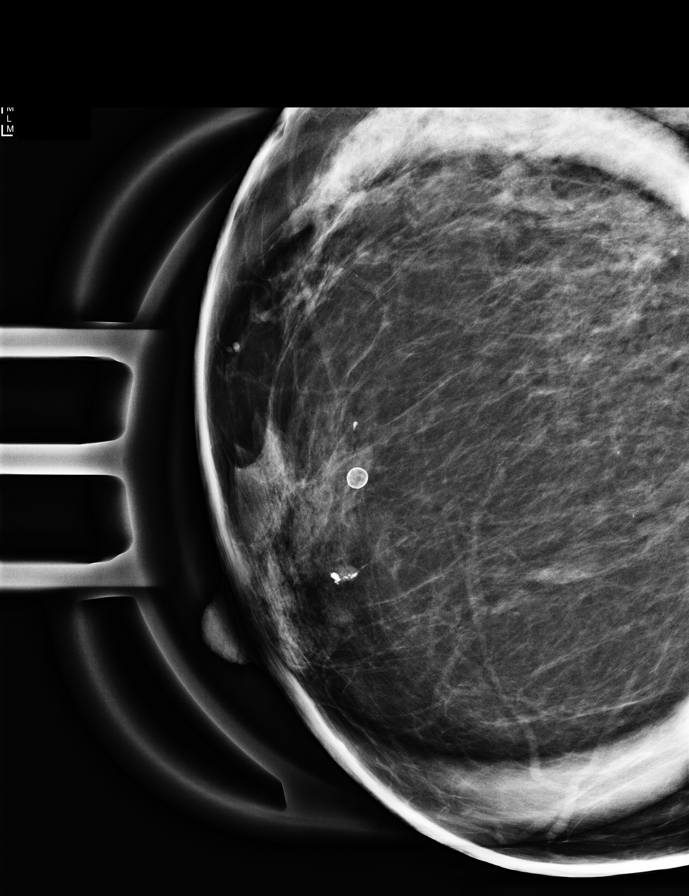

[L ML (2 of 2)]
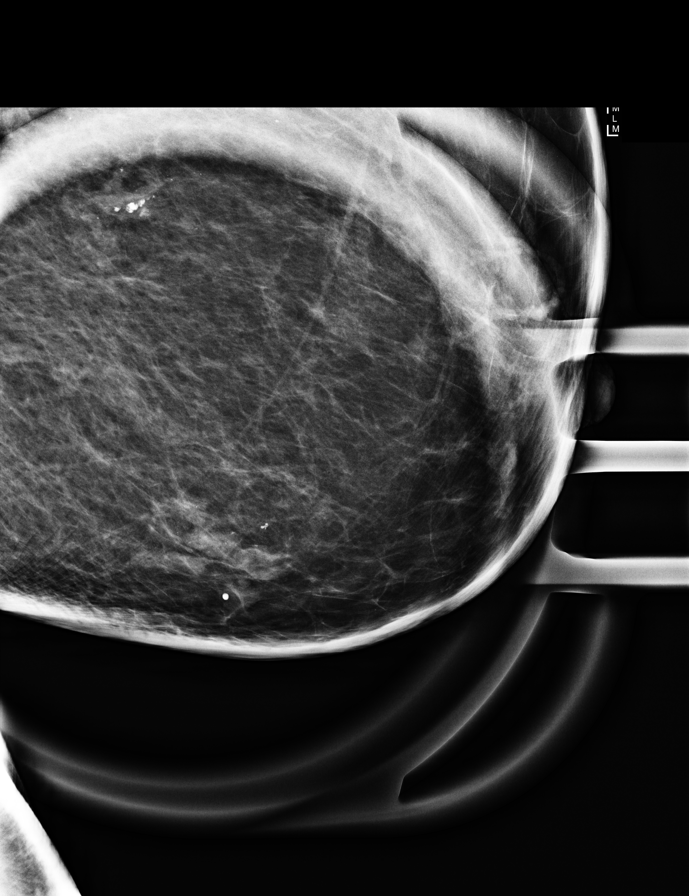

[L CC (2 of 2)]
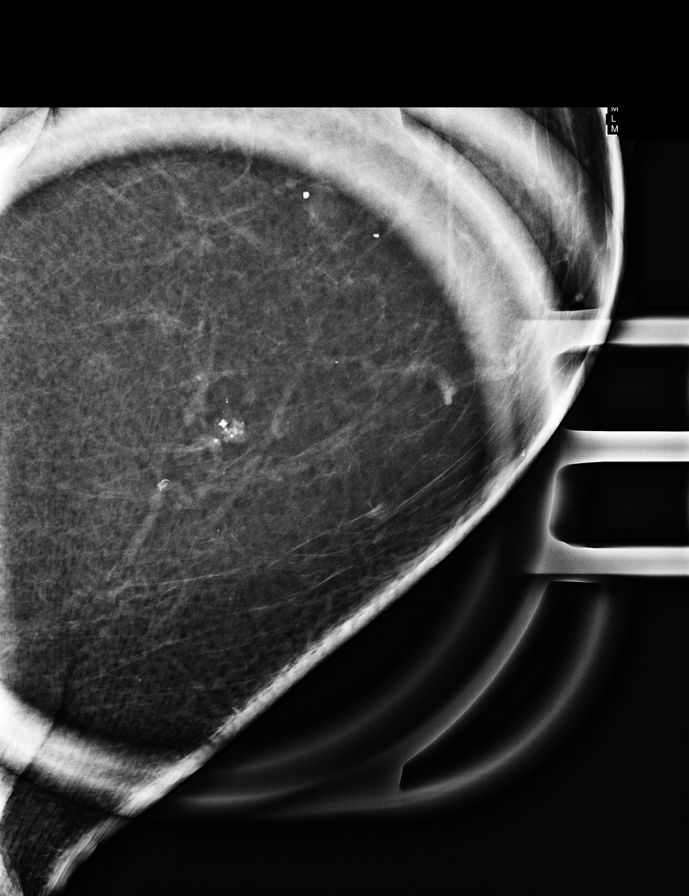

[R CC synth-2D]
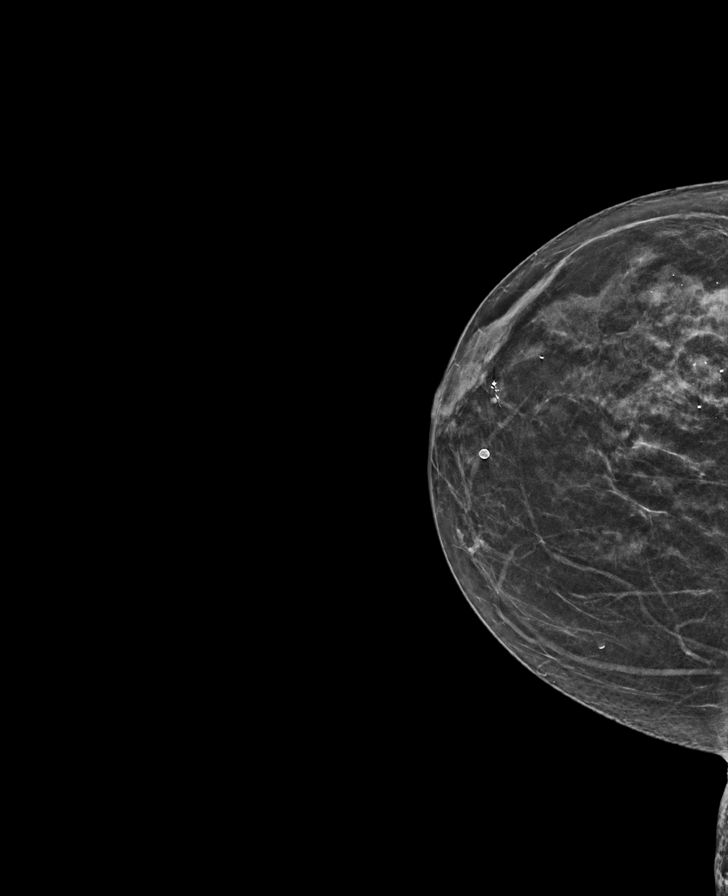

[9 of 32 positions shown; findings below may reference images not displayed]

ACR Breast Density Category b: There are scattered areas of
fibroglandular density.
FINDINGS: The 2 groups of calcifications in the right breast, a subareolar
group and a upper outer quadrant grouped, are stable. The 2 groups
of calcifications in the left breast, a medial group and an inferior
group, are mammographically stable. No suspicious calcifications,
masses or areas of distortion are seen in the bilateral breasts.

Mammographic images were processed with CAD.
IMPRESSION: 1. The 2 groups of calcifications in each breast have demonstrated 2
years of stability, and are therefore benign.

2.  No mammographic evidence of malignancy in the bilateral breasts.

RECOMMENDATION:
Screening mammogram in one year.(Code:[id number])

I have discussed the findings and recommendations with the patient.
If applicable, a reminder letter will be sent to the patient
regarding the next appointment.

BI-RADS CATEGORY  2: Benign.

## 2021-09-25 ENCOUNTER — Other Ambulatory Visit: Payer: Self-pay | Admitting: Endocrinology

## 2021-09-25 ENCOUNTER — Telehealth: Payer: Self-pay

## 2021-09-25 MED ORDER — TRULICITY 1.5 MG/0.5ML ~~LOC~~ SOAJ: 1.5000 mg | SUBCUTANEOUS | 3 refills | Status: DC

## 2021-09-25 NOTE — Telephone Encounter (Signed)
Pt stated that since she has been on Trulicity 3mg  she has been having Diarrhea. Back in November she was on the 1.5 and was not having this problem nor with her sugar dropping.Dec 5th, went back on 3mg  and still having diarrhea. Mon thru Thursday the symptoms are the worst and then on Fridays, it will start to easy up.

## 2021-09-25 NOTE — Telephone Encounter (Signed)
Patient requests to be called at ph# [phone] re: Patient states that she was doing well with Trulicity 1.5, however, since starting the Trulicity 3.0 Patient has severe diarrhea and is losing weight rapidly. Patient states she still has approx. 3 months of Trulicity 3.0.

## 2021-09-28 ENCOUNTER — Telehealth: Payer: Self-pay | Admitting: Endocrinology

## 2021-09-28 NOTE — Telephone Encounter (Signed)
Patient called to advise that Trulicity 1.5 MG was not received by pharmacy.  Also wanted to provide an alternative fax # for RX [phone]  Patient can clarify - call [phone]

## 2021-09-28 NOTE — Telephone Encounter (Signed)
Rx has been faxed out

## 2021-09-28 NOTE — Telephone Encounter (Signed)
LVM for pt to cb with clarification on Rx

## 2021-10-21 ENCOUNTER — Other Ambulatory Visit: Payer: Self-pay

## 2021-10-21 ENCOUNTER — Ambulatory Visit: Payer: HMO | Admitting: Endocrinology

## 2021-10-21 VITALS — BP 150/84 | HR 87 | Ht 59.0 in | Wt 119.4 lb

## 2021-10-21 DIAGNOSIS — E1159 Type 2 diabetes mellitus with other circulatory complications: Secondary | ICD-10-CM | POA: Diagnosis not present

## 2021-10-21 DIAGNOSIS — E1059 Type 1 diabetes mellitus with other circulatory complications: Secondary | ICD-10-CM

## 2021-10-21 LAB — POCT GLYCOSYLATED HEMOGLOBIN (HGB A1C): Hemoglobin A1C: 7 % — AB (ref 4.0–5.6)

## 2021-10-21 MED ORDER — NOVOLOG FLEXPEN 100 UNIT/ML ~~LOC~~ SOPN: 8.0000 [IU] | PEN_INJECTOR | Freq: Three times a day (TID) | SUBCUTANEOUS | 3 refills | Status: DC

## 2021-10-21 NOTE — Patient Instructions (Addendum)
Your blood pressure is high today.  Please see your primary care provider soon, to have it rechecked.   check your blood sugar 4 times a day: before the 3 meals, and at bedtime.  also check if you have symptoms of your blood sugar being too high or too low.  please keep a record of the readings and bring it to your next appointment here (or you can bring the meter itself).  You can write it on any piece of paper.  please call us sooner if your blood sugar goes below 70, or if you have a lot of readings over 200.   Please continue the same insulin and other diabetes medications Please come back for a follow-up appointment in 3 months.

## 2021-10-21 NOTE — Progress Notes (Signed)
Subjective:    Patient ID: Nancy Sutton, female    DOB: 1945-10-14, 76 y.o.   MRN: [id number]  HPI Pt returns for f/u of diabetes mellitus:  DM type: Insulin-requiring type 2.   Dx'ed: 6168 Complications: CVA Therapy: insulin since 3729, Trulicity, and metformin.   GDM: never DKA: never Severe hypoglycemia: never Pancreatitis: never Pancreatic imaging: never SDOH: she cannot afford continuous glucose monitor; she gets insulins from [employer] (pt assist).   Other: she takes multiple daily injections.  Interval history: she brings a record of her cbg's which I have reviewed today.  cbg varies from 81-265.  It is in general higher as the day goes on, but not necessarily so.  Diarrhea resolved with reduced Trulicity.  She seldom has hypoglycemia, and these episodes are mild.   Past Medical History:  Diagnosis Date   Arthritis    Diabetes mellitus    since 2008   Essential hypertension, benign 09/10/2015   GERD (gastroesophageal reflux disease)    Hypercholesteremia    Hyperlipidemia    Stroke ([place]) 03/2011   only deficits-affected eyesight    Past Surgical History:  Procedure Laterality Date   CATARACT EXTRACTION W/PHACO  11/01/2011   Procedure: CATARACT EXTRACTION PHACO AND INTRAOCULAR LENS PLACEMENT ([place]);  Surgeon: Tonny Branch, MD;  Location: [facility];  Service: Ophthalmology;  Laterality: Right;  CDE 12.48   ESOPHAGEAL DILATION N/A 04/29/2017   Procedure: ESOPHAGEAL DILATION;  Surgeon: Rogene Houston, MD;  [facility];  Service: Endoscopy;  Laterality: N/A;   ESOPHAGOGASTRODUODENOSCOPY N/A 04/29/2017   Procedure: ESOPHAGOGASTRODUODENOSCOPY (EGD);  Surgeon: Rogene Houston, MD;  [facility];  Service: Endoscopy;  Laterality: N/A;  1015   ESOPHAGOGASTRODUODENOSCOPY (EGD) WITH ESOPHAGEAL DILATION N/A 02/14/2013   Procedure: ESOPHAGOGASTRODUODENOSCOPY (EGD) WITH ESOPHAGEAL DILATION;  Surgeon: Rogene Houston, MD;  [facility];  Service: Endoscopy;   Laterality: N/A;  200-moved to 23 Ann to notify pt   EYE SURGERY  10-2010   left eye removal of cataract   TUBAL LIGATION  1984    Social History   Socioeconomic History   Marital status: Married    Spouse name: wayne   Number of children: 2   Years of education: 12   Highest education level: Not on file  Occupational History   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   Patient is right handed.   Patient has hs education.   Patient drinks 3-5 glasses of tea daily.   Social Determinants of Health   Financial Resource Strain: Not on file  Food Insecurity: Not on file  Transportation Needs: Not on file  Physical Activity: Not on file  Stress: Not on file  Social Connections: Not on file  Intimate Partner Violence: Not on file    Current Outpatient Medications on File Prior to Visit  Medication Sig Dispense Refill   Blood Glucose Monitoring Suppl (ONE TOUCH ULTRA 2) w/Device KIT : Use as instructed 4 x daily 1 kit 0   brimonidine (ALPHAGAN)  0.2 % ophthalmic solution Place 1 drop into both eyes 3 (three) times daily.     calcium carbonate (TUMS - DOSED IN MG ELEMENTAL CALCIUM) 500 MG chewable tablet Chew 1 tablet by mouth at bedtime as needed for indigestion or heartburn.     clopidogrel (PLAVIX) 75 MG tablet Take 1 tablet (75 mg total) by mouth daily. 90 tablet 3   dorzolamide-timolol (COSOPT) 22.3-6.8 MG/ML ophthalmic solution Place 1 drop into both eyes 2 (two) times daily.     Dulaglutide (TRULICITY) 1.5 XU/3.8BF SOPN Inject 1.5 mg into the skin once a week. 6 mL 3   ezetimibe  (ZETIA) 10 MG tablet Take 10 mg by mouth daily.     GLOBAL EASE INJECT PEN NEEDLES 32G X 4 MM MISC USE 4 PENTIPS EVERY DAY 100 each 5   GLOBAL INJECT EASE LANCETS 30G MISC DIABETIC CLUB 100 each 12   glucosamine-chondroitin 500-400 MG tablet Take 1 tablet by mouth daily. 1500 mg     insulin detemir (LEVEMIR) 100 UNIT/ML FlexPen Inject 24 Units into the skin at bedtime. 30 mL 3   latanoprost (XALATAN) 0.005 % ophthalmic solution Place 1 drop into the right eye at bedtime.  11   losartan (COZAAR) 25 MG tablet Take 25 mg by mouth daily.      metFORMIN (GLUCOPHAGE-XR) 500 MG 24 hr tablet Take 1 tablet (500 mg total) by mouth daily. 90 tablet 3   Multiple Vitamin (ONE-A-DAY 55 PLUS PO) Take 1 tablet by mouth daily.      naproxen sodium (ANAPROX) 220 MG tablet Take 220 mg by mouth daily as needed (pain).     omeprazole (PRILOSEC) 40 MG capsule Take 1 capsule (40 mg total) by mouth daily. 90 capsule 3   ONETOUCH ULTRA test strip Test BS 4 x daily 400 each 12   simvastatin (ZOCOR) 80 MG tablet Take 80 mg by mouth every evening.      vitamin C (ASCORBIC ACID) 500 MG tablet Take 500 mg by mouth daily.     Vitamin D, Cholecalciferol, 50 MCG (2000 UT) CAPS Take by mouth daily.     vitamin E 180 MG (400 UNITS) capsule Take 400 Units by mouth daily.     No current facility-administered medications on file prior to visit.    Allergies  Allergen Reactions   Ampicillin Hives    Breaks out in bumps Has patient had a PCN reaction causing immediate rash, facial/tongue/throat swelling, SOB or lightheadedness with hypotension: No Has patient had a PCN reaction causing severe rash involving mucus membranes or skin necrosis: Yes Has patient had a PCN reaction that required hospitalization: No Has patient had a PCN reaction occurring within the last 10 years: No If all of the above answers are "NO", then may proceed with Cephalosporin use.    Bromday [Bromfenac Sodium] Itching and Swelling   Lisinopril Cough    Tetanus Toxoids Other (See Comments)    Breaks out in bumps    Family History  Problem Relation Age of Onset   Cancer Mother    Diabetes Mother    Cancer Father    Diabetes Father    Anesthesia problems Neg Hx    Hypotension Neg Hx    Malignant hyperthermia Neg Hx    Pseudochol deficiency Neg Hx     BP (!) 150/84    Pulse 87    Ht 4' 11"  (1.499 m)    Wt 119 lb 6.4 oz (54.2 kg)    SpO2 99%  BMI 24.12 kg/m    Review of Systems She denies hypoglycemia    Objective:   Physical Exam  Lab Results  Component Value Date   HGBA1C 7.0 (A) 10/21/2021    Lab Results  Component Value Date   CREATININE 0.86 10/20/2020   BUN 28 (H) 10/20/2020   NA 138 10/20/2020   K 4.1 10/20/2020   CL 104 10/20/2020   CO2 27 10/20/2020      Assessment & Plan:  Insulin-requiring type 2 DM.  Hypoglycemia, due to insulin: this limits aggressiveness of glycemic control.    Patient Instructions  Your blood pressure is high today.  Please see your primary care provider soon, to have it rechecked.   check your blood sugar 4 times a day: before the 3 meals, and at bedtime.  also check if you have symptoms of your blood sugar being too high or too low.  please keep a record of the readings and bring it to your next appointment here (or you can bring the meter itself).  You can write it on any piece of paper.  please call us sooner if your blood sugar goes below 70, or if you have a lot of readings over 200.   Please continue the same insulin and other diabetes medications Please come back for a follow-up appointment in 3 months.

## 2021-11-04 ENCOUNTER — Telehealth: Payer: Self-pay

## 2021-11-04 NOTE — Telephone Encounter (Signed)
Patient has now been informed that her patient assistance consisting of 3 boxes of Novolog has arrived to our office and ready for pick up.

## 2021-11-05 NOTE — Telephone Encounter (Signed)
Patient picked up Novolog patient assistance - log noted as well

## 2021-11-09 DIAGNOSIS — H903 Sensorineural hearing loss, bilateral: Secondary | ICD-10-CM | POA: Diagnosis not present

## 2021-11-09 DIAGNOSIS — H6123 Impacted cerumen, bilateral: Secondary | ICD-10-CM | POA: Diagnosis not present

## 2021-12-01 ENCOUNTER — Other Ambulatory Visit: Payer: Self-pay

## 2021-12-01 DIAGNOSIS — E1059 Type 1 diabetes mellitus with other circulatory complications: Secondary | ICD-10-CM

## 2021-12-01 MED ORDER — INSULIN DETEMIR 100 UNIT/ML FLEXPEN: 24.0000 [IU] | PEN_INJECTOR | Freq: Every day | SUBCUTANEOUS | 3 refills | Status: DC

## 2021-12-02 ENCOUNTER — Telehealth: Payer: Self-pay

## 2021-12-02 NOTE — Telephone Encounter (Signed)
Incoming call from patient inquiring about her [employer] paper work. Says that the company will be faxing over paperwork that needs to be signed and includes Novolog and Levemir.  ? ?Patient says if she isnt home it is fine to leave a detailed message. ?

## 2021-12-11 ENCOUNTER — Telehealth: Payer: Self-pay

## 2021-12-11 NOTE — Telephone Encounter (Signed)
Attempted to return the patient's call after she left a voicemail regarding her Novolog and Levemir with patient assistance [employer]. Called to inform her that we have not yet received anything from them but looks like a Rx for Novolog was faxed on 10/21/21. LVM for a call back regarding her concerns. ?

## 2021-12-12 ENCOUNTER — Other Ambulatory Visit: Payer: Self-pay | Admitting: Endocrinology

## 2021-12-12 ENCOUNTER — Other Ambulatory Visit (INDEPENDENT_AMBULATORY_CARE_PROVIDER_SITE_OTHER): Payer: Self-pay | Admitting: Gastroenterology

## 2021-12-12 DIAGNOSIS — K2100 Gastro-esophageal reflux disease with esophagitis, without bleeding: Secondary | ICD-10-CM

## 2021-12-14 ENCOUNTER — Other Ambulatory Visit: Payer: Self-pay

## 2021-12-14 DIAGNOSIS — H401131 Primary open-angle glaucoma, bilateral, mild stage: Secondary | ICD-10-CM | POA: Diagnosis not present

## 2021-12-14 DIAGNOSIS — E1059 Type 1 diabetes mellitus with other circulatory complications: Secondary | ICD-10-CM

## 2021-12-14 MED ORDER — INSULIN DETEMIR 100 UNIT/ML FLEXPEN: 24.0000 [IU] | PEN_INJECTOR | Freq: Every day | SUBCUTANEOUS | 3 refills | Status: DC

## 2021-12-14 MED ORDER — NOVOLOG FLEXPEN 100 UNIT/ML ~~LOC~~ SOPN: 8.0000 [IU] | PEN_INJECTOR | Freq: Three times a day (TID) | SUBCUTANEOUS | 3 refills | Status: DC

## 2021-12-24 ENCOUNTER — Telehealth: Payer: Self-pay

## 2021-12-24 NOTE — Telephone Encounter (Signed)
Patient assistance for Novolog has now been sent to [employer].  ? ?Faxed to [phone] ?

## 2022-01-12 ENCOUNTER — Telehealth: Payer: Self-pay

## 2022-01-12 NOTE — Telephone Encounter (Signed)
Patient informed that 2 boxes of Levemir patience assistance has arrived to the office and ready for pick up. Patient will be picking up tomorrow. ?

## 2022-01-13 NOTE — Telephone Encounter (Signed)
Patient picked up Levemir. ?

## 2022-01-21 ENCOUNTER — Ambulatory Visit: Payer: HMO | Admitting: Endocrinology

## 2022-01-21 ENCOUNTER — Encounter: Payer: Self-pay | Admitting: Endocrinology

## 2022-01-21 VITALS — BP 148/96 | HR 90 | Ht 59.0 in | Wt 122.6 lb

## 2022-01-21 DIAGNOSIS — E1159 Type 2 diabetes mellitus with other circulatory complications: Secondary | ICD-10-CM | POA: Diagnosis not present

## 2022-01-21 DIAGNOSIS — E1059 Type 1 diabetes mellitus with other circulatory complications: Secondary | ICD-10-CM

## 2022-01-21 LAB — POCT GLYCOSYLATED HEMOGLOBIN (HGB A1C): Hemoglobin A1C: 7 % — AB (ref 4.0–5.6)

## 2022-01-21 NOTE — Progress Notes (Signed)
? ?Subjective:  ? ? Patient ID: Nancy Sutton, female    DOB: 07/28/1946, 76 y.o.   MRN: [id number] ? ?HPI ?Pt returns for f/u of diabetes mellitus:  ?DM type: Insulin-requiring type 2.   ?Dx'ed: 2008 ?Complications: CVA ?Therapy: insulin since 6226, Trulicity, and metformin.   ?GDM: never ?DKA: never ?Severe hypoglycemia: never ?Pancreatitis: never ?Pancreatic imaging: never ?SDOH: she cannot afford continuous glucose monitor; she gets insulins and Trulicity from [employer] (pt assist).   ?Other: she takes multiple daily injections.  ?Interval history: she brings a record of her cbg's which I have reviewed today.  cbg varies from 81-245. There is no trend throughout the day.  She seldom has hypoglycemia, and these episodes are mild.   ?Past Medical History:  ?Diagnosis Date  ? Arthritis   ? Diabetes mellitus   ? since 2008  ? Essential hypertension, benign 09/10/2015  ? GERD (gastroesophageal reflux disease)   ? Hypercholesteremia   ? Hyperlipidemia   ? Stroke [facility]) 03/2011  ? only deficits-affected eyesight  ? ? ?Past Surgical History:  ?Procedure Laterality Date  ? CATARACT EXTRACTION W/PHACO  11/01/2011  ? Procedure: CATARACT EXTRACTION PHACO AND INTRAOCULAR LENS PLACEMENT (IOC);  Surgeon: Tonny Branch, MD;  Location: [facility] ORS;  Service: Ophthalmology;  Laterality: Right;  CDE 12.48  ? ESOPHAGEAL DILATION N/A 04/29/2017  ? Procedure: ESOPHAGEAL DILATION;  Surgeon: Rogene Houston, MD;  Location: [facility];  Service: Endoscopy;  Laterality: N/A;  ? ESOPHAGOGASTRODUODENOSCOPY N/A 04/29/2017  ? Procedure: ESOPHAGOGASTRODUODENOSCOPY (EGD);  Surgeon: Rogene Houston, MD;  Location: [facility];  Service: Endoscopy;  Laterality: N/A;  1015  ? ESOPHAGOGASTRODUODENOSCOPY (EGD) WITH ESOPHAGEAL DILATION N/A 02/14/2013  ? Procedure: ESOPHAGOGASTRODUODENOSCOPY (EGD) WITH ESOPHAGEAL DILATION;  Surgeon: Rogene Houston, MD;  [facility]: [facility];  Service: Endoscopy;  Laterality: N/A;  200-moved to 255 Ann to notify pt  ? EYE  SURGERY  10-2010  ? left eye removal of cataract  ? TUBAL LIGATION  1984  ? ? ?Social History  ? ?Socioeconomic History  ? Marital status: Married  ?  Spouse name: wayne  ? Number of children: 2  ? Years of education: 34  ? Highest education level: Not on file  ?Occupational History  ? 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  Employer: RETIRED  ?Tobacco Use  ? Smoking status: Never  ? Smokeless tobacco: Never  ?Vaping Use  ? Vaping Use: Never used  ?Substance and Sexual Activity  ? Alcohol use: No  ?  Alcohol/week: 0.0 standard drinks  ? Drug use: No  ? Sexual activity: Yes  ?  Birth control/protection: None  ?Other Topics Concern  ? Not on file  ?Social History Narrative  ? Patient is married with 2 children.  ? Patient is right handed.  ? Patient has hs education.  ? Patient drinks 3-5 glasses of tea daily.  ? ?Social Determinants of Health  ? ?Financial Resource Strain: Not on file  ?Food Insecurity: Not on file  ?Transportation Needs: Not on file  ?Physical Activity: Not on file  ?Stress: Not on file  ?Social Connections: Not on file  ?Intimate Partner Violence: Not on file  ? ? ?Current Outpatient Medications on File Prior to Visit  ?Medication Sig Dispense Refill  ? Blood Glucose Monitoring Suppl (ONE TOUCH ULTRA 2) w/Device KIT : Use as instructed 4 x daily 1 kit 0  ? brimonidine (ALPHAGAN) 0.2 % ophthalmic solution Place 1 drop into both eyes 3 (three)  times daily.    ? calcium carbonate (TUMS - DOSED IN MG ELEMENTAL CALCIUM) 500 MG chewable tablet Chew 1 tablet by mouth at bedtime as needed for indigestion or heartburn.    ? clopidogrel (PLAVIX) 75 MG tablet Take 1 tablet (75 mg total) by mouth daily. 90 tablet 3  ? dorzolamide-timolol (COSOPT) 22.3-6.8 MG/ML ophthalmic solution Place 1 drop into both eyes 2 (two) times daily.    ? Dulaglutide (TRULICITY) 1.5 RA/0.7MA SOPN Inject 1.5 mg into the skin once a week. 6 mL 3  ? ezetimibe (ZETIA) 10 MG tablet Take 10 mg by mouth daily.    ? GLOBAL  EASE INJECT PEN NEEDLES 32G X 4 MM MISC USE 4 PENTIPS EVERY DAY 100 each 5  ? GLOBAL INJECT EASE LANCETS 30G MISC DIABETIC CLUB 100 each 12  ? glucosamine-chondroitin 500-400 MG tablet Take 1 tablet by mouth daily. 1500 mg    ? insulin aspart (NOVOLOG FLEXPEN) 100 UNIT/ML FlexPen Inject 8-10 Units into the skin 3 (three) times daily with meals. 30 mL 3  ? insulin detemir (LEVEMIR) 100 UNIT/ML FlexPen Inject 24 Units into the skin at bedtime. 30 mL 3  ? latanoprost (XALATAN) 0.005 % ophthalmic solution Place 1 drop into the right eye at bedtime.  11  ? losartan (COZAAR) 25 MG tablet Take 25 mg by mouth daily.     ? metFORMIN (GLUCOPHAGE-XR) 500 MG 24 hr tablet Take 1 tablet (500 mg total) by mouth daily. 90 tablet 3  ? Multiple Vitamin (ONE-A-DAY 55 PLUS PO) Take 1 tablet by mouth daily.     ? naproxen sodium (ANAPROX) 220 MG tablet Take 220 mg by mouth daily as needed (pain).    ? omeprazole (PRILOSEC) 40 MG capsule TAKE ONE CAPSULE BY MOUTH DAILY 90 capsule 3  ? ONETOUCH ULTRA test strip Test BS 4 x daily 400 each 12  ? simvastatin (ZOCOR) 80 MG tablet Take 80 mg by mouth every evening.     ? vitamin C (ASCORBIC ACID) 500 MG tablet Take 500 mg by mouth daily.    ? Vitamin D, Cholecalciferol, 50 MCG (2000 UT) CAPS Take by mouth daily.    ? vitamin E 180 MG (400 UNITS) capsule Take 400 Units by mouth daily.    ? ?No current facility-administered medications on file prior to visit.  ? ? ?Allergies  ?Allergen Reactions  ? Ampicillin Hives  ?  Breaks out in bumps ?Has patient had a PCN reaction causing immediate rash, facial/tongue/throat swelling, SOB or lightheadedness with hypotension: No ?Has patient had a PCN reaction causing severe rash involving mucus membranes or skin necrosis: Yes ?Has patient had a PCN reaction that required hospitalization: No ?Has patient had a PCN reaction occurring within the last 10 years: No ?If all of the above answers are "NO", then may proceed with Cephalosporin use. ?  ? Bromday  [Bromfenac Sodium] Itching and Swelling  ? Lisinopril Cough  ? Tetanus Toxoids Other (See Comments)  ?  Breaks out in bumps  ? ? ?Family History  ?Problem Relation Age of Onset  ? Cancer Mother   ? Diabetes Mother   ? Cancer Father   ? Diabetes Father   ? Anesthesia problems Neg Hx   ? Hypotension Neg Hx   ? Malignant hyperthermia Neg Hx   ? Pseudochol deficiency Neg Hx   ? ? ?BP (!) 148/96 (BP Location: Left Arm, Patient Position: Sitting, Cuff Size: Normal)   Pulse 90   Ht _0  (1.499  m)   Wt 122 lb 9.6 oz (55.6 kg)   SpO2 98%   BMI 24.76 kg/m?  ? ? ?Review of Systems ? ?   ?Objective:  ? Physical Exam ?VITAL SIGNS:  See vs page.   ?GENERAL: no distress.   ? ? ?A1c=7.0% ?   ?Assessment & Plan:  ?Insulin-requiring type 2 DM ?Hypoglycemia, due to insulin: this limits aggressiveness of glycemic control.  ? ?Patient Instructions  ?Your blood pressure is high today.  Please see your primary care provider soon, to have it rechecked.   ?check your blood sugar 4 times a day: before the 3 meals, and at bedtime.  also check if you have symptoms of your blood sugar being too high or too low.  please keep a record of the readings and bring it to your next appointment here (or you can bring the meter itself).  You can write it on any piece of paper.  please call us sooner if your blood sugar goes below 70, or if you have a lot of readings over 200.   ?Please continue the same insulin and other diabetes medications.   ?You should have an endocrinology follow-up appointment in 4 months.   ? ? ? ?

## 2022-01-21 NOTE — Patient Instructions (Addendum)
Your blood pressure is high today.  Please see your primary care provider soon, to have it rechecked.   ?check your blood sugar 4 times a day: before the 3 meals, and at bedtime.  also check if you have symptoms of your blood sugar being too high or too low.  please keep a record of the readings and bring it to your next appointment here (or you can bring the meter itself).  You can write it on any piece of paper.  please call us sooner if your blood sugar goes below 70, or if you have a lot of readings over 200.   ?Please continue the same insulin and other diabetes medications.   ?You should have an endocrinology follow-up appointment in 4 months.   ? ?

## 2022-01-26 ENCOUNTER — Telehealth: Payer: Self-pay

## 2022-01-26 NOTE — Telephone Encounter (Signed)
3 boxes of Novolog insulin received from patient assistance. Patient informed and will come to the office today to pick up.  ?

## 2022-01-26 NOTE — Telephone Encounter (Signed)
Patient picked up 3 boxes of Novolog insulin received from patient assistance. ?

## 2022-01-28 ENCOUNTER — Other Ambulatory Visit: Payer: Self-pay | Admitting: Internal Medicine

## 2022-01-28 DIAGNOSIS — Z1231 Encounter for screening mammogram for malignant neoplasm of breast: Secondary | ICD-10-CM

## 2022-02-05 ENCOUNTER — Ambulatory Visit
Admission: RE | Admit: 2022-02-05 | Discharge: 2022-02-05 | Disposition: A | Discharge status: Home / Self Care | Payer: HMO | Source: Ambulatory Visit | Attending: Internal Medicine | Admitting: Internal Medicine

## 2022-02-05 DIAGNOSIS — Z1231 Encounter for screening mammogram for malignant neoplasm of breast: Secondary | ICD-10-CM | POA: Diagnosis not present

## 2022-03-19 ENCOUNTER — Other Ambulatory Visit: Payer: Self-pay | Admitting: Endocrinology

## 2022-03-19 DIAGNOSIS — E1059 Type 1 diabetes mellitus with other circulatory complications: Secondary | ICD-10-CM

## 2022-03-30 ENCOUNTER — Encounter (INDEPENDENT_AMBULATORY_CARE_PROVIDER_SITE_OTHER): Payer: Self-pay | Admitting: Gastroenterology

## 2022-04-20 DIAGNOSIS — Z0001 Encounter for general adult medical examination with abnormal findings: Secondary | ICD-10-CM | POA: Diagnosis not present

## 2022-04-20 DIAGNOSIS — E119 Type 2 diabetes mellitus without complications: Secondary | ICD-10-CM | POA: Diagnosis not present

## 2022-04-20 DIAGNOSIS — D518 Other vitamin B12 deficiency anemias: Secondary | ICD-10-CM | POA: Diagnosis not present

## 2022-04-20 DIAGNOSIS — I63139 Cerebral infarction due to embolism of unspecified carotid artery: Secondary | ICD-10-CM | POA: Diagnosis not present

## 2022-04-20 DIAGNOSIS — I10 Essential (primary) hypertension: Secondary | ICD-10-CM | POA: Diagnosis not present

## 2022-04-20 DIAGNOSIS — E1165 Type 2 diabetes mellitus with hyperglycemia: Secondary | ICD-10-CM | POA: Diagnosis not present

## 2022-04-20 DIAGNOSIS — N182 Chronic kidney disease, stage 2 (mild): Secondary | ICD-10-CM | POA: Diagnosis not present

## 2022-04-20 DIAGNOSIS — E559 Vitamin D deficiency, unspecified: Secondary | ICD-10-CM | POA: Diagnosis not present

## 2022-04-20 DIAGNOSIS — Z6825 Body mass index (BMI) 25.0-25.9, adult: Secondary | ICD-10-CM | POA: Diagnosis not present

## 2022-04-20 DIAGNOSIS — Z1331 Encounter for screening for depression: Secondary | ICD-10-CM | POA: Diagnosis not present

## 2022-04-20 DIAGNOSIS — E039 Hypothyroidism, unspecified: Secondary | ICD-10-CM | POA: Diagnosis not present

## 2022-04-20 DIAGNOSIS — I950 Idiopathic hypotension: Secondary | ICD-10-CM | POA: Diagnosis not present

## 2022-04-22 ENCOUNTER — Encounter (INDEPENDENT_AMBULATORY_CARE_PROVIDER_SITE_OTHER): Payer: Self-pay | Admitting: Gastroenterology

## 2022-04-22 ENCOUNTER — Encounter (INDEPENDENT_AMBULATORY_CARE_PROVIDER_SITE_OTHER): Payer: Self-pay

## 2022-04-22 ENCOUNTER — Other Ambulatory Visit (INDEPENDENT_AMBULATORY_CARE_PROVIDER_SITE_OTHER): Payer: Self-pay

## 2022-04-22 ENCOUNTER — Ambulatory Visit (INDEPENDENT_AMBULATORY_CARE_PROVIDER_SITE_OTHER): Payer: PPO | Admitting: Gastroenterology

## 2022-04-22 VITALS — BP 185/76 | HR 84 | Temp 98.4°F | Ht 60.0 in | Wt 122.2 lb

## 2022-04-22 DIAGNOSIS — K2270 Barrett's esophagus without dysplasia: Secondary | ICD-10-CM

## 2022-04-22 DIAGNOSIS — K219 Gastro-esophageal reflux disease without esophagitis: Secondary | ICD-10-CM

## 2022-04-22 NOTE — Progress Notes (Signed)
Daniel Castaneda, M.D. Gastroenterology & Hepatology [facility] [address] [place], [place] [address]  Primary Care Physician: Fusco, Lawrence, MD [address]  [place] [address]  I will communicate my assessment and recommendations to the referring MD via EMR.  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  The patient was last seen on 05/12/2021. At that time, the patient was continued on omeprazole 40 mg every day.  Patient reports that she has been taking omeprazole 40 mg qday compliantly without any side effects. Denies any complaints. States she has been able to swallow without difficulty. No odynophagia.  The patient denies having any nausea, vomiting, fever, chills, hematochezia, melena, hematemesis, abdominal distention, abdominal pain, diarrhea, jaundice, pruritus or weight loss.  Last EGD: 04/29/2017, found to have a benign stricture 33 cm from the incisors with lumen measuring 1.3 cm, this was dilated with a balloon up to 19 mm, there was a 4 cm hiatal hernia, grade a esophagitis and changes suspicious for 1 cm short segment Barrett's esophagus.  There are few gastric polyps, also normal small bowel Last Colonoscopy: Had negative Cologuard in Jan 2022.  Past Medical History: Past Medical History:  Diagnosis Date   Arthritis    Diabetes mellitus    since 2008   Essential hypertension, benign 09/10/2015   GERD (gastroesophageal reflux disease)    Hypercholesteremia    Hyperlipidemia    Stroke ([facility]) 03/2011   only deficits-affected  eyesight    Past Surgical History: Past Surgical History:  Procedure Laterality Date   CATARACT EXTRACTION W/PHACO  11/01/2011   Procedure: CATARACT EXTRACTION PHACO AND INTRAOCULAR LENS PLACEMENT (IOC);  Surgeon: Kerry Hunt, MD;  Location: [facility];  Service: Ophthalmology;  Laterality: Right;  CDE 12.48   ESOPHAGEAL DILATION N/A 04/29/2017   Procedure: ESOPHAGEAL DILATION;  Surgeon: Rehman, Najeeb U, MD;  [facility];  Service: Endoscopy;  Laterality: N/A;   ESOPHAGOGASTRODUODENOSCOPY N/A 04/29/2017   Procedure: ESOPHAGOGASTRODUODENOSCOPY (EGD);  Surgeon: Rehman, Najeeb U, MD;  [facility];  Service: Endoscopy;  Laterality: N/A;  1015   ESOPHAGOGASTRODUODENOSCOPY (EGD) WITH ESOPHAGEAL DILATION N/A 02/14/2013   Procedure: ESOPHAGOGASTRODUODENOSCOPY (EGD) WITH ESOPHAGEAL DILATION;  Surgeon: Najeeb U Rehman, MD;  [facility];  Service: Endoscopy;  Laterality: N/A;  200-moved to 255 Ann to notify pt   EYE SURGERY  10-2010   left eye removal of cataract   TUBAL LIGATION  1984    Family History: Family History  Problem Relation Age of Onset   Cancer Mother    Diabetes Mother    Cancer Father    Diabetes Father    Anesthesia problems Neg Hx    Hypotension Neg Hx    Malignant hyperthermia Neg Hx    Pseudochol deficiency Neg Hx     Social History: Social History   Tobacco Use  Smoking Status Never  Smokeless Tobacco Never   Social History   Substance and Sexual Activity  Alcohol Use No   Alcohol/week: 0.0 standard drinks of alcohol   Social History   Substance and Sexual Activity  Drug Use No    Allergies: Allergies  Allergen Reactions   Ampicillin   Hives    Breaks out in bumps Has patient had a PCN reaction causing immediate rash, facial/tongue/throat swelling, SOB or lightheadedness with hypotension: No Has patient had a PCN reaction causing severe rash involving mucus membranes or skin necrosis: Yes Has patient had a PCN reaction that  required hospitalization: No Has patient had a PCN reaction occurring within the last 10 years: No If all of the above answers are "NO", then may proceed with Cephalosporin use.    Bromday [Bromfenac Sodium] Itching and Swelling   Lisinopril Cough   Tetanus Toxoids Other (See Comments)    Breaks out in bumps    Medications: Current Outpatient Medications  Medication Sig Dispense Refill   Blood Glucose Monitoring Suppl (ONE TOUCH ULTRA 2) w/Device KIT : Use as instructed 4 x daily 1 kit 0   calcium carbonate (TUMS - DOSED IN MG ELEMENTAL CALCIUM) 500 MG chewable tablet Chew 1 tablet by mouth at bedtime as needed for indigestion or heartburn.     clopidogrel (PLAVIX) 75 MG tablet Take 1 tablet (75 mg total) by mouth daily. 90 tablet 3   Dulaglutide (TRULICITY) 1.5 MG/0.5ML SOPN Inject 1.5 mg into the skin once a week. 6 mL 3   ezetimibe (ZETIA) 10 MG tablet Take 10 mg by mouth daily.     GLOBAL EASE INJECT PEN NEEDLES 32G X 4 MM MISC USE 4 PENTIPS EVERY DAY 100 each 5   GLOBAL INJECT EASE LANCETS 30G MISC DIABETIC CLUB 100 each 12   insulin aspart (NOVOLOG FLEXPEN) 100 UNIT/ML FlexPen Inject 8-10 Units into the skin 3 (three) times daily with meals. (Patient taking differently: Inject 8-12 Units into the skin 3 (three) times daily with meals.) 30 mL 3   insulin detemir (LEVEMIR) 100 UNIT/ML FlexPen Inject 24 Units into the skin at bedtime. 30 mL 3   latanoprost (XALATAN) 0.005 % ophthalmic solution Place 1 drop into the right eye at bedtime.  11   metFORMIN (GLUCOPHAGE-XR) 500 MG 24 hr tablet Take 1 tablet (500 mg total) by mouth daily. 90 tablet 3   Multiple Vitamin (ONE-A-DAY 55 PLUS PO) Take 1 tablet by mouth daily.      naproxen sodium (ANAPROX) 220 MG tablet Take 220 mg by mouth daily as needed (pain).     omeprazole (PRILOSEC) 40 MG capsule TAKE ONE CAPSULE BY MOUTH DAILY 90 capsule 3   ONETOUCH ULTRA test strip test blood sugar FOUR TIMES DAILY 400 strip 12   simvastatin (ZOCOR) 80  MG tablet Take 80 mg by mouth every evening.      timolol (BETIMOL) 0.5 % ophthalmic solution Place 1 drop into both eyes 2 (two) times daily.     vitamin C (ASCORBIC ACID) 500 MG tablet Take 500 mg by mouth daily.     Vitamin D, Cholecalciferol, 50 MCG (2000 UT) CAPS Take by mouth daily.     vitamin E 180 MG (400 UNITS) capsule Take 400 Units by mouth daily.     losartan (COZAAR) 25 MG tablet Take 25 mg by mouth daily.  (Patient not taking: Reported on 04/22/2022)     No current facility-administered medications for this visit.    Review of Systems: GENERAL: negative for malaise, night sweats HEENT: No changes in hearing or vision, no nose bleeds or other nasal problems. NECK: Negative for lumps, goiter, pain and significant neck swelling RESPIRATORY: Negative for cough, wheezing CARDIOVASCULAR: Negative for chest pain, leg swelling, palpitations, orthopnea GI: SEE HPI MUSCULOSKELETAL: Negative for joint pain or swelling,   back pain, and muscle pain. SKIN: Negative for lesions, rash PSYCH: Negative for sleep disturbance, mood disorder and recent psychosocial stressors. HEMATOLOGY Negative for prolonged bleeding, bruising easily, and swollen nodes. ENDOCRINE: Negative for cold or heat intolerance, polyuria, polydipsia and goiter. NEURO: negative for tremor, gait imbalance, syncope and seizures. The remainder of the review of systems is noncontributory.   Physical Exam: BP (!) 185/76 (BP Location: Left Arm, Patient Position: Sitting, Cuff Size: Small)   Pulse 84   Temp 98.4 F (36.9 C) (Oral)   Ht 5' (1.524 m)   Wt 122 lb 3.2 oz (55.4 kg)   BMI 23.87 kg/m  GENERAL: The patient is AO x3, in no acute distress. HEENT: Head is normocephalic and atraumatic. EOMI are intact. Mouth is well hydrated and without lesions. NECK: Supple. No masses LUNGS: Clear to auscultation. No presence of rhonchi/wheezing/rales. Adequate chest expansion HEART: RRR, normal s1 and s2. ABDOMEN: Soft,  nontender, no guarding, no peritoneal signs, and nondistended. BS +. No masses. EXTREMITIES: Without any cyanosis, clubbing, rash, lesions or edema. NEUROLOGIC: AOx3, no focal motor deficit. SKIN: no jaundice, no rashes  Imaging/Labs: as above  I personally reviewed and interpreted the available labs, imaging and endoscopic files.  Impression and Plan: Tyonna F Biedermann is a 76 y.o. 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 She has had adequate control of her symptoms with omeprazole 40 mg qday, which should be continued to prevent symptom recurrence/ She is due for BE surveillance, an EGD will be scheduled.  - Schedule EGD - Continue omeprazole 40 mg qday - will need to obtain clearance from PCP to stop Plavix 5 days before the procedure  All questions were answered.      Daniel Castaneda Mayorga, MD Gastroenterology and Hepatology  [facility]  

## 2022-04-22 NOTE — Patient Instructions (Signed)
Schedule EGD Continue omeprazole 40 mg qday

## 2022-04-27 ENCOUNTER — Telehealth: Payer: Self-pay

## 2022-04-27 NOTE — Telephone Encounter (Signed)
Patient advised that [employer] patient assistance is ready for pick

## 2022-05-10 ENCOUNTER — Other Ambulatory Visit (HOSPITAL_COMMUNITY): Payer: Self-pay | Admitting: Internal Medicine

## 2022-05-10 ENCOUNTER — Telehealth: Payer: Self-pay | Admitting: Internal Medicine

## 2022-05-10 ENCOUNTER — Other Ambulatory Visit: Payer: Self-pay | Admitting: Internal Medicine

## 2022-05-10 DIAGNOSIS — I63139 Cerebral infarction due to embolism of unspecified carotid artery: Secondary | ICD-10-CM

## 2022-05-10 NOTE — Telephone Encounter (Signed)
Informed pt that we have not yet received her Novolog via [employer]. Pt says that it was shipped out on 7/26. We will inform her once package has been received

## 2022-05-10 NOTE — Telephone Encounter (Signed)
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  Patient is calling to make sure that we have received them.  Patient states that if no answer that a message may be left.

## 2022-05-11 NOTE — Telephone Encounter (Signed)
Patient came in to pick up 3 boxes of Novolog.

## 2022-05-11 NOTE — Telephone Encounter (Signed)
3 boxes of Novolog has been received via patient assistance. Patient says that she will be coming to pick up today/tomorrow.

## 2022-05-13 ENCOUNTER — Ambulatory Visit (INDEPENDENT_AMBULATORY_CARE_PROVIDER_SITE_OTHER): Payer: Medicare Other | Admitting: Gastroenterology

## 2022-05-17 DIAGNOSIS — I639 Cerebral infarction, unspecified: Secondary | ICD-10-CM | POA: Diagnosis not present

## 2022-05-26 ENCOUNTER — Encounter (HOSPITAL_COMMUNITY)
Admission: RE | Admit: 2022-05-26 | Discharge: 2022-05-26 | Disposition: A | Discharge status: Home / Self Care | Payer: HMO | Source: Ambulatory Visit | Attending: Gastroenterology | Admitting: Gastroenterology

## 2022-05-27 ENCOUNTER — Ambulatory Visit (INDEPENDENT_AMBULATORY_CARE_PROVIDER_SITE_OTHER): Payer: HMO | Admitting: Internal Medicine

## 2022-05-27 ENCOUNTER — Encounter: Payer: Self-pay | Admitting: Internal Medicine

## 2022-05-27 VITALS — BP 132/72 | HR 88 | Ht 60.0 in | Wt 119.4 lb

## 2022-05-27 DIAGNOSIS — Z794 Long term (current) use of insulin: Secondary | ICD-10-CM | POA: Diagnosis not present

## 2022-05-27 DIAGNOSIS — E1159 Type 2 diabetes mellitus with other circulatory complications: Secondary | ICD-10-CM | POA: Diagnosis not present

## 2022-05-27 DIAGNOSIS — E785 Hyperlipidemia, unspecified: Secondary | ICD-10-CM | POA: Diagnosis not present

## 2022-05-27 LAB — POCT GLYCOSYLATED HEMOGLOBIN (HGB A1C): Hemoglobin A1C: 6.9 % — AB (ref 4.0–5.6)

## 2022-05-27 MED ORDER — INSULIN DETEMIR 100 UNIT/ML FLEXPEN: 27.0000 [IU] | PEN_INJECTOR | Freq: Every day | SUBCUTANEOUS | 3 refills | Status: DC

## 2022-05-27 MED ORDER — GLUCAGON 3 MG/DOSE NA POWD: 3.0000 mg | Freq: Once | NASAL | 11 refills | Status: DC | PRN

## 2022-05-27 MED ORDER — NOVOLOG FLEXPEN 100 UNIT/ML ~~LOC~~ SOPN: 10.0000 [IU] | PEN_INJECTOR | Freq: Three times a day (TID) | SUBCUTANEOUS | 3 refills | Status: DC

## 2022-05-27 NOTE — Progress Notes (Addendum)
Patient ID: Nancy Sutton, female   DOB: [birth year], 76 y.o.   MRN: [id number]  HPI: Nancy Sutton is a 76 y.o.-year-old female, returning for follow-up for DM2, dx in 2008, insulin-dependent since 2015, uncontrolled, with complications (h/o stroke in 2012). Pt. previously saw Dr. Dorris Fetch and then Dr. Loanne Drilling, last visit with him: 01/2022. She is here with her husband who offers part of the history.  Reviewed HbA1c: Lab Results  Component Value Date   HGBA1C 7.0 (A) 01/21/2022   HGBA1C 7.0 (A) 10/21/2021   HGBA1C 6.7 (A) 05/14/2021   HGBA1C 7.5 (A) 01/21/2021   HGBA1C 8.7 (A) 10/20/2020   HGBA1C 8.0 (H) 07/20/2019   HGBA1C 8.3 (H) 04/17/2019   HGBA1C 9.0 (H) 11/29/2018   HGBA1C 8.1 (H) 08/23/2018   HGBA1C 7.6 (H) 04/19/2018   Pt is on a regimen of: - Metformin ER 500 mg 1x a day in am - Trulicity 1.5 mg weekly (PAP) - tried the 3 mg >> lost 18 lbs 2/2 diarrhea - Levemir 24 units at bedtime (PAP) - Novolog 8-10 units 3x a day, right before meals (PAP)  Pt. Tried a CGM [employer]) >> allergy to the adhesive; Dexcom was not covered. She checks her sugars 4x a day and they are: - am: 131-226 - 2h after b'fast: n/c - before lunch: 96-245 - 2h after lunch: n/c - before dinner: 130-241 - 2h after dinner: n/c - bedtime: 88-251 - nighttime: n/c Lowest sugar was 27 >> ...88; she has hypoglycemia awareness at 70.  Highest sugar was 251.  Glucometer: One Touch ultra  - no CKD, last BUN/creatinine:  Lab Results  Component Value Date   BUN 28 (H) 10/20/2020   BUN 25 07/20/2019   CREATININE 0.86 10/20/2020   CREATININE 0.82 07/20/2019  She is on losartan 25 mg daily - now off 2/2 low BP >> but will start back soon.  - + HL; last set of lipids: Lab Results  Component Value Date   CHOL 260 (H) 10/11/2016   HDL 96 10/11/2016   LDLCALC 144 (H) 10/11/2016   TRIG 98 10/11/2016   CHOLHDL 2.7 10/11/2016  She is on Zocor 80 mg daily, Zetia 10 mg daily.  - last eye exam was in 2022. No DR  reportedly. [employer]. Glaucoma suspect.  - no numbness and tingling in her feet.  Last foot exam 04/2021.  She has a history of HTN, OA, GERD.  ROS: + see HPI No increased urination, blurry vision, nausea, chest pain.  Past Medical History:  Diagnosis Date   Arthritis    Diabetes mellitus    since 2008   Essential hypertension, benign 09/10/2015   GERD (gastroesophageal reflux disease)    Hypercholesteremia    Hyperlipidemia    Stroke ([place]) 03/2011   only deficits-affected eyesight   Past Surgical History:  Procedure Laterality Date   CATARACT EXTRACTION W/PHACO  11/01/2011   Procedure: CATARACT EXTRACTION PHACO AND INTRAOCULAR LENS PLACEMENT ([place]);  Surgeon: Tonny Branch, MD;  Location: [facility];  Service: Ophthalmology;  Laterality: Right;  CDE 12.48   ESOPHAGEAL DILATION N/A 04/29/2017   Procedure: ESOPHAGEAL DILATION;  Surgeon: Rogene Houston, MD;  Location: [facility];  Service: Endoscopy;  Laterality: N/A;   ESOPHAGOGASTRODUODENOSCOPY N/A 04/29/2017   Procedure: ESOPHAGOGASTRODUODENOSCOPY (EGD);  Surgeon: Rogene Houston, MD;  Location: [facility];  Service: Endoscopy;  Laterality: N/A;  1015   ESOPHAGOGASTRODUODENOSCOPY (EGD) WITH ESOPHAGEAL DILATION N/A 02/14/2013   Procedure: ESOPHAGOGASTRODUODENOSCOPY (EGD) WITH ESOPHAGEAL  DILATION;  Surgeon: Rogene Houston, MD;  Location: [facility];  Service: Endoscopy;  Laterality: N/A;  200-moved to 82 Ann to notify pt   EYE SURGERY  10-2010   left eye removal of cataract   TUBAL LIGATION  1984   Social History   Socioeconomic History   Marital status: Married    Spouse name: wayne   Number of children: 2   Years of education: 12   Highest education level: Not on file  Occupational History   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   Patient is right handed.   Patient has hs education.   Patient drinks 3-5 glasses of tea daily.   Social Determinants of Health   Financial Resource Strain: Not on file  Food Insecurity: Not on file  Transportation Needs: Not on file  Physical Activity: Not on file  Stress: Not on file  Social Connections: Not on file  Intimate Partner Violence: Not on file   Current Outpatient Medications on File Prior to Visit  Medication Sig Dispense Refill   Blood Glucose Monitoring Suppl (ONE TOUCH ULTRA 2) w/Device KIT : Use as instructed 4 x daily 1 kit 0   calcium carbonate (TUMS - DOSED IN MG ELEMENTAL CALCIUM) 500 MG chewable tablet Chew 1 tablet by mouth at bedtime as needed for indigestion or heartburn.     cholecalciferol (VITAMIN D3) 25 MCG (1000 UNIT) tablet Take 1,000 Units by mouth daily.     clopidogrel (PLAVIX) 75 MG tablet Take 1 tablet (75 mg total) by mouth daily. 90 tablet 3   Dulaglutide (TRULICITY) 1.5 XV/4.0GQ SOPN Inject 1.5 mg into the skin once a week. 6 mL 3   ezetimibe (ZETIA) 10 MG tablet Take 10 mg by mouth daily.     GLOBAL EASE INJECT PEN NEEDLES 32G X 4 MM MISC USE 4 PENTIPS EVERY DAY 100 each 5   GLOBAL INJECT EASE LANCETS 30G MISC DIABETIC CLUB 100 each 12   insulin aspart (NOVOLOG FLEXPEN) 100 UNIT/ML FlexPen Inject 8-10 Units into the skin 3 (three) times daily with meals. 30 mL 3   insulin detemir (LEVEMIR) 100 UNIT/ML FlexPen Inject 24 Units into the skin at bedtime. 30 mL 3   latanoprost (XALATAN) 0.005 % ophthalmic solution Place 1 drop into both eyes at bedtime.  11   losartan (COZAAR) 25 MG tablet Take 25 mg by mouth daily.     metFORMIN (GLUCOPHAGE) 500 MG tablet Take 500 mg by mouth daily.     Multiple Vitamin  (ONE-A-DAY 55 PLUS PO) Take 1 tablet by mouth daily.      Multiple Vitamins-Minerals (HAIR SKIN AND NAILS FORMULA) TABS Take 1 tablet by mouth daily.     omeprazole (PRILOSEC) 40 MG capsule TAKE ONE CAPSULE BY MOUTH DAILY 90 capsule 3   ONETOUCH ULTRA test strip test blood sugar FOUR TIMES DAILY 400 strip 12   simvastatin (ZOCOR) 80 MG tablet Take 80 mg by mouth every evening.      timolol (  BETIMOL) 0.5 % ophthalmic solution Place 1 drop into both eyes 2 (two) times daily.     vitamin C (ASCORBIC ACID) 500 MG tablet Take 500 mg by mouth daily.     vitamin E 180 MG (400 UNITS) capsule Take 400 Units by mouth daily.     No current facility-administered medications on file prior to visit.   Allergies  Allergen Reactions   Ampicillin Hives    Breaks out in bumps Has patient had a PCN reaction causing immediate rash, facial/tongue/throat swelling, SOB or lightheadedness with hypotension: No Has patient had a PCN reaction causing severe rash involving mucus membranes or skin necrosis: Yes Has patient had a PCN reaction that required hospitalization: No Has patient had a PCN reaction occurring within the last 10 years: No If all of the above answers are "NO", then may proceed with Cephalosporin use.    Bromday [Bromfenac Sodium] Itching and Swelling   Lisinopril Cough   Tetanus Toxoids Hives    Breaks out in bumps   Family History  Problem Relation Age of Onset   Cancer Mother    Diabetes Mother    Cancer Father    Diabetes Father    Anesthesia problems Neg Hx    Hypotension Neg Hx    Malignant hyperthermia Neg Hx    Pseudochol deficiency Neg Hx    PE: BP 132/72 (BP Location: Right Arm, Patient Position: Sitting, Cuff Size: Normal)   Pulse 88   Ht 5' (1.524 m)   Wt 119 lb 6.4 oz (54.2 kg)   SpO2 99%   BMI 23.32 kg/m  Wt Readings from Last 3 Encounters:  05/27/22 119 lb 6.4 oz (54.2 kg)  04/22/22 122 lb 3.2 oz (55.4 kg)  01/21/22 122 lb 9.6 oz (55.6 kg)   Constitutional:  normal weight, in NAD Eyes: no exophthalmos ENT: moist mucous membranes, no thyromegaly, no cervical lymphadenopathy Cardiovascular: RRR, No MRG Respiratory: CTA B Musculoskeletal: no deformities Skin: moist, warm, no rashes Neurological: no tremor with outstretched hands  ASSESSMENT: 1. DM2, insulin-dependent, uncontrolled, with complications - Cerebrovascular disease - h/o stroke (affecting eyesight only)  2. HL  PLAN:  1. Patient with long-standing, uncontrolled diabetes, on oral antidiabetic regimen with low-dose metformin, also on weekly GLP-1 receptor agonist and basal-bolus insulin regimen, with improving control.  Last 2 HbA1c levels have been at goal, at 7.0%.  At today's visit, HbA1c is 6.9% (lower, but this is actually lower than expected from her log). -She keeps an excellent log, checking her blood sugars 4 times a day, before meals and at bedtime.  Reviewing these values, they fluctuate significantly, between 80s and 200s.  The majority of the blood sugars are above goal, though. -At this visit, we discussed about options for treatment.  She is on a low-dose of metformin, which we can continue for now.  She is on a medium dose of Trulicity.  She had significant GI side effects when she tried to increase the dose, so for now, we will continue with the 1.5 mg weekly.  To improve her morning blood sugars, I did suggest to increase the Levemir slightly.  I am reticent to increase it further, due to reported history of significant hypoglycemia during the night, during which, her husband found her down near the bed.  She mentions that this did not happen recently.  I also feel that she needs more NovoLog for meals.  I advised her to increase the dose to 10-12 units and to vary the dose  based on the size of the meals.  Also, importantly, she is taking NovoLog and eats immediately after and I advised her to try to wait 15 minutes between the insulin doses and the meal.  She agrees to try  this. - I suggested to:  Patient Instructions  Please continue: - Metformin ER 500 mg 1x a day - Trulicity 1.5 mg weekly  Please change: - Levemir 27 units at bedtime - Novolog 10-12 units 3x a day, move it 15 min before meals  Please return in 3-4 months with your sugar log.   - check sugars at different times of the day - check 3-4x a day, rotating checks - discussed about CBG targets for treatment: 80-130 mg/dL before meals and <180 mg/dL after meals; target HbA1c <7%. - given foot care handout  - given instructions for hypoglycemia management "15-15 rule"  - advised for yearly eye exams  - Return to clinic in 3-4 mo with sugar log   2. HL - Reviewed latest lipid panel from 2018: LDL above target, the rest of the fractions at goal: Lab Results  Component Value Date   CHOL 260 (H) 10/11/2016   HDL 96 10/11/2016   LDLCALC 144 (H) 10/11/2016   TRIG 98 10/11/2016   CHOLHDL 2.7 10/11/2016  - Continues Zocor 80 mg daily Zetia 10 mg daily -  without side effects. - she had another lipid panel with Dr. Gerarda Fraction in 01/2022 -we will ask for records  Component     Latest Ref Rng 05/27/2022  Glucose     65 - 99 mg/dL 137 (H)   Hemoglobin A1C     4.0 - 5.6 % 6.9 !   Islet Cell Ab     Neg:<1:1  Negative   C-Peptide     0.80 - 3.85 ng/mL 1.85   ZNT8 Antibodies     <15 U/mL <10   Glutamic Acid Decarb Ab     <5 IU/mL >250 (H)     Normal insulin production, but elevated GAD Ab's.  These labs confirm LADA, which explains her blood sugar variability. We will continue the above regimen.  Philemon Kingdom, MD PhD [facility] Endocrinology

## 2022-05-27 NOTE — Patient Instructions (Addendum)
Please continue: - Metformin ER 500 mg 1x a day - Trulicity 1.5 mg weekly  Please change: - Levemir 27 units at bedtime - Novolog 10-12 units 3x a day, move it 15 min before meals  Please return in 3-4 months with your sugar log.   PATIENT INSTRUCTIONS FOR TYPE 2 DIABETES:  **Please join MyChart!** - see attached instructions about how to join if you have not done so already.  DIET AND EXERCISE Diet and exercise is an important part of diabetic treatment.  We recommended aerobic exercise in the form of brisk walking (working between 40-60% of maximal aerobic capacity, similar to brisk walking) for 150 minutes per week (such as 30 minutes five days per week) along with 3 times per week performing 'resistance' training (using various gauge rubber tubes with handles) 5-10 exercises involving the major muscle groups (upper body, lower body and core) performing 10-15 repetitions (or near fatigue) each exercise. Start at half the above goal but build slowly to reach the above goals. If limited by weight, joint pain, or disability, we recommend daily walking in a swimming pool with water up to waist to reduce pressure from joints while allow for adequate exercise.    BLOOD GLUCOSES Monitoring your blood glucoses is important for continued management of your diabetes. Please check your blood glucoses 2-4 times a day: fasting, before meals and at bedtime (you can rotate these measurements - e.g. one day check before the 3 meals, the next day check before 2 of the meals and before bedtime, etc.).   HYPOGLYCEMIA (low blood sugar) Hypoglycemia is usually a reaction to not eating, exercising, or taking too much insulin/ other diabetes drugs.  Symptoms include tremors, sweating, hunger, confusion, headache, etc. Treat IMMEDIATELY with 15 grams of Carbs: 4 glucose tablets  cup regular juice/soda 2 tablespoons raisins 4 teaspoons sugar 1 tablespoon honey Recheck blood glucose in 15 mins and repeat  above if still symptomatic/blood glucose <100.  RECOMMENDATIONS TO REDUCE YOUR RISK OF DIABETIC COMPLICATIONS: * Take your prescribed MEDICATION(S) * Follow a DIABETIC diet: Complex carbs, fiber rich foods, (monounsaturated and polyunsaturated) fats * AVOID saturated/trans fats, high fat foods, >2,300 mg salt per day. * EXERCISE at least 5 times a week for 30 minutes or preferably daily.  * DO NOT SMOKE OR DRINK more than 1 drink a day. * Check your FEET every day. Do not wear tightfitting shoes. Contact us if you develop an ulcer * See your EYE doctor once a year or more if needed * Get a FLU shot once a year * Get a PNEUMONIA vaccine once before and once after age 91 years  GOALS:  * Your Hemoglobin A1c of <7%  * fasting sugars need to be <130 * after meals sugars need to be <180 (2h after you start eating) * Your Systolic BP should be 140 or lower  * Your Diastolic BP should be 80 or lower  * Your HDL (Good Cholesterol) should be 40 or higher  * Your LDL (Bad Cholesterol) should be 100 or lower. * Your Triglycerides should be 150 or lower  * Your Urine microalbumin (kidney function) should be <30 * Your Body Mass Index should be 25 or lower    Please consider the following ways to cut down carbs and fat and increase fiber and micronutrients in your diet: - substitute whole grain for white bread or pasta - substitute brown rice for white rice - substitute 90-calorie flat bread pieces for slices of bread when  possible - substitute sweet potatoes or yams for white potatoes - substitute humus for margarine - substitute tofu for cheese when possible - substitute almond or rice milk for regular milk (would not drink soy milk daily due to concern for soy estrogen influence on breast cancer risk) - substitute dark chocolate for other sweets when possible - substitute water - can add lemon or orange slices for taste - for diet sodas (artificial sweeteners will trick your body that you  can eat sweets without getting calories and will lead you to overeating and weight gain in the long run) - do not skip breakfast or other meals (this will slow down the metabolism and will result in more weight gain over time)  - can try smoothies made from fruit and almond/rice milk in am instead of regular breakfast - can also try old-fashioned (not instant) oatmeal made with almond/rice milk in am - order the dressing on the side when eating salad at a restaurant (pour less than half of the dressing on the salad) - eat as little meat as possible - can try juicing, but should not forget that juicing will get rid of the fiber, so would alternate with eating raw veg./fruits or drinking smoothies - use as little oil as possible, even when using olive oil - can dress a salad with a mix of balsamic vinegar and lemon juice, for e.g. - use agave nectar, stevia sugar, or regular sugar rather than artificial sweateners - steam or broil/roast veggies  - snack on veggies/fruit/nuts (unsalted, preferably) when possible, rather than processed foods - reduce or eliminate aspartame in diet (it is in diet sodas, chewing gum, etc) Read the labels!  Try to read Dr. Katherina Right book: "Program for Reversing Diabetes" for other ideas for healthy eating.

## 2022-05-28 ENCOUNTER — Telehealth: Payer: Self-pay

## 2022-05-28 ENCOUNTER — Ambulatory Visit (HOSPITAL_BASED_OUTPATIENT_CLINIC_OR_DEPARTMENT_OTHER): Payer: HMO | Admitting: Anesthesiology

## 2022-05-28 ENCOUNTER — Ambulatory Visit (HOSPITAL_COMMUNITY): Payer: HMO | Admitting: Anesthesiology

## 2022-05-28 ENCOUNTER — Encounter (HOSPITAL_COMMUNITY)
Admission: RE | Disposition: A | Discharge status: Home / Self Care | Payer: Self-pay | Source: Home / Self Care | Attending: Gastroenterology

## 2022-05-28 ENCOUNTER — Ambulatory Visit (HOSPITAL_COMMUNITY)
Admission: RE | Admit: 2022-05-28 | Discharge: 2022-05-28 | Disposition: A | Discharge status: Home / Self Care | Payer: HMO | Attending: Gastroenterology | Admitting: Gastroenterology

## 2022-05-28 ENCOUNTER — Encounter (HOSPITAL_COMMUNITY): Payer: Self-pay | Admitting: Gastroenterology

## 2022-05-28 DIAGNOSIS — Z7985 Long-term (current) use of injectable non-insulin antidiabetic drugs: Secondary | ICD-10-CM | POA: Insufficient documentation

## 2022-05-28 DIAGNOSIS — I6930 Unspecified sequelae of cerebral infarction: Secondary | ICD-10-CM | POA: Diagnosis not present

## 2022-05-28 DIAGNOSIS — M1990 Unspecified osteoarthritis, unspecified site: Secondary | ICD-10-CM | POA: Diagnosis not present

## 2022-05-28 DIAGNOSIS — Z7902 Long term (current) use of antithrombotics/antiplatelets: Secondary | ICD-10-CM | POA: Diagnosis not present

## 2022-05-28 DIAGNOSIS — Z79899 Other long term (current) drug therapy: Secondary | ICD-10-CM | POA: Diagnosis not present

## 2022-05-28 DIAGNOSIS — I10 Essential (primary) hypertension: Secondary | ICD-10-CM | POA: Insufficient documentation

## 2022-05-28 DIAGNOSIS — Z7984 Long term (current) use of oral hypoglycemic drugs: Secondary | ICD-10-CM | POA: Insufficient documentation

## 2022-05-28 DIAGNOSIS — K449 Diaphragmatic hernia without obstruction or gangrene: Secondary | ICD-10-CM | POA: Diagnosis not present

## 2022-05-28 DIAGNOSIS — K2270 Barrett's esophagus without dysplasia: Secondary | ICD-10-CM

## 2022-05-28 DIAGNOSIS — E119 Type 2 diabetes mellitus without complications: Secondary | ICD-10-CM | POA: Insufficient documentation

## 2022-05-28 DIAGNOSIS — K219 Gastro-esophageal reflux disease without esophagitis: Secondary | ICD-10-CM | POA: Diagnosis not present

## 2022-05-28 DIAGNOSIS — Z794 Long term (current) use of insulin: Secondary | ICD-10-CM | POA: Insufficient documentation

## 2022-05-28 HISTORY — PX: ESOPHAGOGASTRODUODENOSCOPY (EGD) WITH PROPOFOL: SHX5813

## 2022-05-28 LAB — ANTI-ISLET CELL ANTIBODY: Islet Cell Ab: NEGATIVE

## 2022-05-28 LAB — GLUCOSE, CAPILLARY: Glucose-Capillary: 139 mg/dL — ABNORMAL HIGH (ref 70–99)

## 2022-05-28 SURGERY — ESOPHAGOGASTRODUODENOSCOPY (EGD) WITH PROPOFOL
Anesthesia: General

## 2022-05-28 MED ORDER — LACTATED RINGERS IV SOLN: INTRAVENOUS | Status: DC

## 2022-05-28 MED ORDER — LIDOCAINE HCL (CARDIAC) PF 100 MG/5ML IV SOSY
PREFILLED_SYRINGE | INTRAVENOUS | Status: DC | PRN
  Administered 2022-05-28: 50 mg via INTRATRACHEAL

## 2022-05-28 MED ORDER — PROPOFOL 10 MG/ML IV BOLUS
INTRAVENOUS | Status: DC | PRN
  Administered 2022-05-28: 60 mg via INTRAVENOUS
  Administered 2022-05-28: 20 mg via INTRAVENOUS

## 2022-05-28 NOTE — Discharge Instructions (Addendum)
You are being discharged to home.  Resume your previous diet.  Your physician has recommended a repeat upper endoscopy at the next available appointment for surveillance.  Continue your present medications.  Restart Plavix tonight.

## 2022-05-28 NOTE — Anesthesia Preprocedure Evaluation (Signed)
Anesthesia Evaluation  Patient identified by MRN, date of birth, ID band Patient awake    Reviewed: Allergy & Precautions, NPO status , Patient's Chart, lab work & pertinent test results  Airway        Dental  (+) Dental Advisory Given   Pulmonary neg pulmonary ROS,    Pulmonary exam normal breath sounds clear to auscultation       Cardiovascular Exercise Tolerance: Good hypertension, Pt. on medications Normal cardiovascular exam Rhythm:Regular Rate:Normal     Neuro/Psych CVA, Residual Symptoms negative psych ROS   GI/Hepatic Neg liver ROS, GERD  Medicated,  Endo/Other  diabetes, Well Controlled, Type 2, Insulin Dependent  Renal/GU negative Renal ROS  negative genitourinary   Musculoskeletal  (+) Arthritis , Osteoarthritis,    Abdominal   Peds negative pediatric ROS (+)  Hematology negative hematology ROS (+)   Anesthesia Other Findings   Reproductive/Obstetrics negative OB ROS                            Anesthesia Physical Anesthesia Plan  ASA: 3  Anesthesia Plan: General   Post-op Pain Management: Minimal or no pain anticipated   Induction: Intravenous  PONV Risk Score and Plan: Propofol infusion  Airway Management Planned: Nasal Cannula and Natural Airway  Additional Equipment:   Intra-op Plan:   Post-operative Plan:   Informed Consent: I have reviewed the patients History and Physical, chart, labs and discussed the procedure including the risks, benefits and alternatives for the proposed anesthesia with the patient or authorized representative who has indicated his/her understanding and acceptance.     Dental advisory given  Plan Discussed with: CRNA and Surgeon  Anesthesia Plan Comments:         Anesthesia Quick Evaluation

## 2022-05-28 NOTE — H&P (Signed)
Nancy Sutton is an 76 y.o. female.   Chief Complaint: history BE and GERD HPI: Nancy Sutton is a 76 y.o. female with past medical history of type 1 diabetes, hypertension hyperlipidemia, stroke on Plavix, GERD complicated by peptic esophageal stricture requiring dilations and short segment Barrett's esophagus without dysplasia, who presents for follow up of dysphagia, GERD and history BE.  The patient denies having any nausea, vomiting, fever, chills, hematochezia, melena, hematemesis, abdominal distention, abdominal pain, diarrhea, jaundice, pruritus or weight loss.  Has presented very occasional dysphagia.  Past Medical History:  Diagnosis Date   Arthritis    Diabetes mellitus    since 2008   Essential hypertension, benign 09/10/2015   GERD (gastroesophageal reflux disease)    Hypercholesteremia    Hyperlipidemia    Stroke ([place]) 03/2011   only deficits-affected eyesight    Past Surgical History:  Procedure Laterality Date   CATARACT EXTRACTION W/PHACO  11/01/2011   Procedure: CATARACT EXTRACTION PHACO AND INTRAOCULAR LENS PLACEMENT ([place]);  Surgeon: Tonny Branch, MD;  Location: [facility];  Service: Ophthalmology;  Laterality: Right;  CDE 12.48   ESOPHAGEAL DILATION N/A 04/29/2017   Procedure: ESOPHAGEAL DILATION;  Surgeon: Rogene Houston, MD;  Location: [facility];  Service: Endoscopy;  Laterality: N/A;   ESOPHAGOGASTRODUODENOSCOPY N/A 04/29/2017   Procedure: ESOPHAGOGASTRODUODENOSCOPY (EGD);  Surgeon: Rogene Houston, MD;  [facility];  Service: Endoscopy;  Laterality: N/A;  1015   ESOPHAGOGASTRODUODENOSCOPY (EGD) WITH ESOPHAGEAL DILATION N/A 02/14/2013   Procedure: ESOPHAGOGASTRODUODENOSCOPY (EGD) WITH ESOPHAGEAL DILATION;  Surgeon: Rogene Houston, MD;  [facility];  Service: Endoscopy;  Laterality: N/A;  200-moved to 28 Ann to notify pt   EYE SURGERY  10-2010   left eye removal of cataract   TUBAL LIGATION  1984    Family History  Problem Relation Age  of Onset   Cancer Mother    Diabetes Mother    Cancer Father    Diabetes Father    Anesthesia problems Neg Hx    Hypotension Neg Hx    Malignant hyperthermia Neg Hx    Pseudochol deficiency Neg Hx    Social History:  reports that she has never smoked. She has never used smokeless tobacco. She reports that she does not drink alcohol and does not use drugs.  Allergies:  Allergies  Allergen Reactions   Ampicillin Hives    Breaks out in bumps Has patient had a PCN reaction causing immediate rash, facial/tongue/throat swelling, SOB or lightheadedness with hypotension: No Has patient had a PCN reaction causing severe rash involving mucus membranes or skin necrosis: Yes Has patient had a PCN reaction that required hospitalization: No Has patient had a PCN reaction occurring within the last 10 years: No If all of the above answers are "NO", then may proceed with Cephalosporin use.    Bromday [Bromfenac Sodium] Itching and Swelling   Lisinopril Cough   Tetanus Toxoids Hives    Breaks out in bumps    Medications Prior to Admission  Medication Sig Dispense Refill   calcium carbonate (TUMS - DOSED IN MG ELEMENTAL CALCIUM) 500 MG chewable tablet Chew 1 tablet by mouth at bedtime as needed for indigestion or heartburn.     cholecalciferol (VITAMIN D3) 25 MCG (1000 UNIT) tablet Take 1,000 Units by mouth daily.     clopidogrel (PLAVIX) 75 MG tablet Take 1 tablet (75 mg total) by mouth daily. 90 tablet 3   Dulaglutide (TRULICITY) 1.5 ZO/1.0RU SOPN Inject 1.5 mg into  the skin once a week. 6 mL 3   ezetimibe (ZETIA) 10 MG tablet Take 10 mg by mouth daily.     insulin detemir (LEVEMIR) 100 UNIT/ML FlexPen Inject 27 Units into the skin at bedtime. 30 mL 3   latanoprost (XALATAN) 0.005 % ophthalmic solution Place 1 drop into both eyes at bedtime.  11   metFORMIN (GLUCOPHAGE) 500 MG tablet Take 500 mg by mouth daily.     Multiple Vitamin (ONE-A-DAY 55 PLUS PO) Take 1 tablet by mouth daily.       Multiple Vitamins-Minerals (HAIR SKIN AND NAILS FORMULA) TABS Take 1 tablet by mouth daily.     omeprazole (PRILOSEC) 40 MG capsule TAKE ONE CAPSULE BY MOUTH DAILY 90 capsule 3   simvastatin (ZOCOR) 80 MG tablet Take 80 mg by mouth every evening.      timolol (BETIMOL) 0.5 % ophthalmic solution Place 1 drop into both eyes 2 (two) times daily.     vitamin C (ASCORBIC ACID) 500 MG tablet Take 500 mg by mouth daily.     vitamin E 180 MG (400 UNITS) capsule Take 400 Units by mouth daily.     Blood Glucose Monitoring Suppl (ONE TOUCH ULTRA 2) w/Device KIT : Use as instructed 4 x daily 1 kit 0   GLOBAL EASE INJECT PEN NEEDLES 32G X 4 MM MISC USE 4 PENTIPS EVERY DAY 100 each 5   GLOBAL INJECT EASE LANCETS 30G MISC DIABETIC CLUB 100 each 12   Glucagon 3 MG/DOSE POWD Place 3 mg into the nose once as needed for up to 1 dose. 1 each 11   insulin aspart (NOVOLOG FLEXPEN) 100 UNIT/ML FlexPen Inject 10-12 Units into the skin 3 (three) times daily with meals. 30 mL 3   losartan (COZAAR) 25 MG tablet Take 25 mg by mouth daily.     ONETOUCH ULTRA test strip test blood sugar FOUR TIMES DAILY 400 strip 12    Results for orders placed or performed during the hospital encounter of 05/28/22 (from the past 48 hour(s))  Glucose, capillary     Status: Abnormal   Collection Time: 05/28/22  9:16 AM  Result Value Ref Range   Glucose-Capillary 139 (H) 70 - 99 mg/dL    Comment: Glucose reference range applies only to samples taken after fasting for at least 8 hours.   No results found.  Review of Systems  All other systems reviewed and are negative.   Blood pressure (!) 166/63, pulse 82, temperature 97.6 F (36.4 C), temperature source Oral, resp. rate 18, SpO2 100 %. Physical Exam  GENERAL: The patient is AO x3, in no acute distress. HEENT: Head is normocephalic and atraumatic. EOMI are intact. Mouth is well hydrated and without lesions. NECK: Supple. No masses LUNGS: Clear to auscultation. No presence of  rhonchi/wheezing/rales. Adequate chest expansion HEART: RRR, normal s1 and s2. ABDOMEN: Soft, nontender, no guarding, no peritoneal signs, and nondistended. BS +. No masses. EXTREMITIES: Without any cyanosis, clubbing, rash, lesions or edema. NEUROLOGIC: AOx3, no focal motor deficit. SKIN: no jaundice, no rashes  Assessment/Plan Nancy Sutton is a 76 y.o. female with past medical history of type 1 diabetes, hypertension hyperlipidemia, stroke on Plavix, GERD complicated by peptic esophageal stricture requiring dilations and short segment Barrett's esophagus without dysplasia, who presents for follow up of dysphagia, GERD and history BE. We will proceed with EGD.  Harvel Quale, MD 05/28/2022, 9:35 AM

## 2022-05-28 NOTE — Anesthesia Postprocedure Evaluation (Signed)
Anesthesia Post Note  Patient: Nancy Sutton  Procedure(s) Performed: ESOPHAGOGASTRODUODENOSCOPY (EGD) WITH PROPOFOL  Patient location during evaluation: Phase II Anesthesia Type: General Level of consciousness: awake and alert and oriented Pain management: pain level controlled Vital Signs Assessment: post-procedure vital signs reviewed and stable Respiratory status: spontaneous breathing, nonlabored ventilation and respiratory function stable Cardiovascular status: blood pressure returned to baseline and stable Postop Assessment: no apparent nausea or vomiting Anesthetic complications: no   No notable events documented.   Last Vitals:  Vitals:   05/28/22 1019 05/28/22 1022  BP:  (!) 116/57  Pulse: 84   Resp: 13   Temp: (!) 36.3 C   SpO2: 99%     Last Pain:  Vitals:   05/28/22 1019  TempSrc: Oral  PainSc: 0-No pain                 Kortez Murtagh C Kashmir Leedy

## 2022-05-28 NOTE — Op Note (Signed)
[facility] Patient Name: Nancy Sutton Procedure Date: 05/28/2022 10:00 AM MRN: [id number] Date of Birth: 01/08/1946 Attending MD: [name] ,  CSN: [id number] Age: 76 Admit Type: Outpatient Procedure:                Upper GI endoscopy Indications:              Follow-up of Barrett's esophagus Providers:                [name], Caprice Kluver, Randa Spike,                            Technician Referring MD:              Medicines:                Monitored Anesthesia Care Complications:            No immediate complications. Estimated Blood Loss:     Estimated blood loss: none. Procedure:                Pre-Anesthesia Assessment:                           - Prior to the procedure, a History and Physical                            was performed, and patient medications, allergies                            and sensitivities were reviewed. The patient's                            tolerance of previous anesthesia was reviewed.                           - The risks and benefits of the procedure and the                            sedation options and risks were discussed with the                            patient. All questions were answered and informed                            consent was obtained.                           - ASA Grade Assessment: II - A patient with mild                            systemic disease.                           After obtaining informed consent, the endoscope was                            passed under direct vision. Throughout the  procedure, the patient's blood pressure, pulse, and                            oxygen saturations were monitored continuously. The                            GIF-H190 ([id number]) scope was introduced through the                            mouth, and advanced to the second part of duodenum.                            The patient tolerated the procedure well. The upper                             GI endoscopy was performed with difficulty due to                            presence of food. Scope In: 10:10:51 AM Scope Out: 10:12:04 AM Total Procedure Duration: 0 hours 1 minute 13 seconds  Findings:      A small hiatal hernia was present.      There were esophageal mucosal changes consistent with short-segment       Barrett's esophagus present at the gastroesophageal junction. The       maximum longitudinal extent of these mucosal changes was 1 cm in length.       Athorough inspection could not be performed given the presence of food       in the gastric chamber.      A large amount of food (residue) was found in the gastric body.      The examined duodenum was normal. Impression:               - Small hiatal hernia.                           - Esophageal mucosal changes consistent with                            short-segment Barrett's esophagus.                           - A large amount of food (residue) in the stomach.                           - Normal examined duodenum.                           - No specimens collected. Moderate Sedation:      Per Anesthesia Care Recommendation:           - Discharge patient to home (ambulatory).                           - Resume previous diet.                           -  Repeat upper endoscopy at the next available                            appointment for surveillance - will need to be on                            liquid diet 2 days to procedure.                           - Continue present medications.                           - Restart Plavix tonight. Procedure Code(s):        --- Professional ---                           [phone], Esophagogastroduodenoscopy, flexible,                            transoral; diagnostic, including collection of                            specimen(s) by brushing or washing, when performed                            (separate procedure) Diagnosis Code(s):        --- Professional ---                            K44.9, Diaphragmatic hernia without obstruction or                            gangrene                           K22.70, Barrett's esophagus without dysplasia CPT copyright 2019 American Medical Association. All rights reserved. The codes documented in this report are preliminary and upon coder review may  be revised to meet current compliance requirements. Katrinka Blazing, MD Katrinka Blazing,  05/28/2022 10:57:07 AM This report has been signed electronically. Number of Addenda: 0

## 2022-05-28 NOTE — Telephone Encounter (Signed)
Pt scheduled to have surgery in a few weeks and was advised for the week of her surgery she was advised to not take her Trulicity and for 3 days prior she will be on a clear liquid diet. Pt asked how she should adjust her insulin.

## 2022-05-28 NOTE — Transfer of Care (Signed)
Immediate Anesthesia Transfer of Care Note  Patient: Nancy Sutton  Procedure(s) Performed: ESOPHAGOGASTRODUODENOSCOPY (EGD) WITH PROPOFOL  Patient Location: Endoscopy Unit  Anesthesia Type:General  Level of Consciousness: awake, alert , oriented and patient cooperative  Airway & Oxygen Therapy: Patient Spontanous Breathing  Post-op Assessment: Report given to RN, Post -op Vital signs reviewed and stable and Patient moving all extremities  Post vital signs: Reviewed and stable  Last Vitals:  Vitals Value Taken Time  BP    Temp    Pulse    Resp    SpO2      Last Pain:  Vitals:   05/28/22 1007  TempSrc:   PainSc: 0-No pain      Patients Stated Pain Goal: 5 (05/28/22 0921)  Complications: No notable events documented.

## 2022-05-30 NOTE — Telephone Encounter (Signed)
T, I agree with holding Trulicity and also hold metformin for the duration of the clear liquid diet. Regarding the insulin, I would suggest to just take Levemir 20 units daily and not the mealtime insulin, but check sugars even if not eating. If the sugars increase during the day, she can take small doses of short acting insulin for correction- to bring the sugars down, for e.g. using the following sliding scale: - 150-175: + 1 unit  - 176-200: + 2 units  - 201-225: + 3 units  - 226-250: + 4 units  - >250: + 5 units  Please let us know if she has any issues with the blood sugars on the above regimen. Ty! C

## 2022-05-31 ENCOUNTER — Telehealth (INDEPENDENT_AMBULATORY_CARE_PROVIDER_SITE_OTHER): Payer: Self-pay

## 2022-05-31 ENCOUNTER — Encounter (INDEPENDENT_AMBULATORY_CARE_PROVIDER_SITE_OTHER): Payer: Self-pay

## 2022-05-31 ENCOUNTER — Other Ambulatory Visit (INDEPENDENT_AMBULATORY_CARE_PROVIDER_SITE_OTHER): Payer: Self-pay

## 2022-05-31 DIAGNOSIS — K2270 Barrett's esophagus without dysplasia: Secondary | ICD-10-CM

## 2022-05-31 NOTE — Telephone Encounter (Signed)
-----   Message from Dolores Frame, MD sent at 05/28/2022 11:16 AM EDT ----- Nancy Sutton,   Can you please schedule a repeat Egd in next available? Dx: Barrett's esophagus. Room: any.  Needs to be on liquids for 3 days prior to procedure. Need clearance to stop Plavix  Thanks,  Katrinka Blazing, MD Gastroenterology and Hepatology [facility]

## 2022-05-31 NOTE — Telephone Encounter (Signed)
Nancy Sutton has been rescheduled for Friday 07/02/22 and she is aware of her instructions

## 2022-05-31 NOTE — Telephone Encounter (Signed)
The 20 unit dose decreased was only before the surgery... For now, continue with the plan from the time of the visit.

## 2022-05-31 NOTE — Telephone Encounter (Signed)
Thanks

## 2022-06-01 NOTE — Telephone Encounter (Signed)
Pt advised of recommendation. Letter generated with information included for pt to have written down and mailed out.

## 2022-06-04 ENCOUNTER — Encounter (HOSPITAL_COMMUNITY): Payer: Self-pay | Admitting: Gastroenterology

## 2022-06-04 LAB — GLUTAMIC ACID DECARBOXYLASE AUTO ABS: Glutamic Acid Decarb Ab: >250 IU/mL — ABNORMAL HIGH (ref <5)

## 2022-06-04 LAB — ZNT8 ANTIBODIES: ZNT8 Antibodies: <10 U/mL (ref <15)

## 2022-06-04 LAB — GLUCOSE, FASTING: Glucose, Bld: 137 mg/dL — ABNORMAL HIGH (ref 65–99)

## 2022-06-04 LAB — C-PEPTIDE: C-Peptide: 1.85 ng/mL (ref 0.80–3.85)

## 2022-06-07 ENCOUNTER — Telehealth: Payer: Self-pay

## 2022-06-07 NOTE — Telephone Encounter (Signed)
-----   Message from Philemon Kingdom, MD sent at 06/07/2022 10:00 AM EDT ----- They told me at the time of the visit that she had labs with Dr. Gerarda Fraction in [place].  Please ask her to get in touch with him and get the results.  If they were not checked by Dr. Gerarda Fraction within the last year, I will check her annual labs including these at next visit. Ty! C ----- Message ----- From: Lauralyn Primes, RMA Sent: 06/07/2022   9:49 AM EDT To: Philemon Kingdom, MD  Pt wanted to know about checking her Kidney and Liver functions. States its been a while since it's been checked. ----- Message ----- From: Philemon Kingdom, MD Sent: 06/04/2022  12:21 PM EDT To: [facility] Clinical Pool  Can you please call pt.:  Patient appears to have normal insulin production, but elevated antipancreatic antibodies.  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 Continue the regimen as discussed at last visit.

## 2022-06-07 NOTE — Telephone Encounter (Signed)
Called and left detailed message for pt.

## 2022-06-17 DIAGNOSIS — H401131 Primary open-angle glaucoma, bilateral, mild stage: Secondary | ICD-10-CM | POA: Diagnosis not present

## 2022-06-24 ENCOUNTER — Telehealth: Payer: Self-pay | Admitting: Internal Medicine

## 2022-06-24 NOTE — Telephone Encounter (Signed)
Patient brought financial information by to help complete the forms for patient assistance for [employer] insulin aspart (NOVOLOG FLEXPEN) 100 UNIT/ML FlexPen nsulin detemir (LEVEMIR) 100 UNIT/ML FlexPen   And the Dulaglutide (TRULICITY) 1.5 JD/5.5MC SOPN  goes to [employer] was changed to 12 units before meals and Levemir is 27 units at bedtime and Trulicity is 1.5 mg.  **Patient forms put in Dr. Arman Filter assistant bin.

## 2022-06-25 NOTE — Telephone Encounter (Signed)
Pt contacted and advised the completed application is needed for [employer].

## 2022-07-02 ENCOUNTER — Ambulatory Visit (HOSPITAL_COMMUNITY): Payer: HMO | Admitting: Anesthesiology

## 2022-07-02 ENCOUNTER — Other Ambulatory Visit: Payer: Self-pay

## 2022-07-02 ENCOUNTER — Encounter (HOSPITAL_COMMUNITY): Payer: Self-pay | Admitting: Gastroenterology

## 2022-07-02 ENCOUNTER — Ambulatory Visit (HOSPITAL_BASED_OUTPATIENT_CLINIC_OR_DEPARTMENT_OTHER): Payer: HMO | Admitting: Anesthesiology

## 2022-07-02 ENCOUNTER — Encounter (HOSPITAL_COMMUNITY)
Admission: RE | Disposition: A | Discharge status: Home / Self Care | Payer: Self-pay | Source: Home / Self Care | Attending: Gastroenterology

## 2022-07-02 ENCOUNTER — Ambulatory Visit (HOSPITAL_COMMUNITY)
Admission: RE | Admit: 2022-07-02 | Discharge: 2022-07-02 | Disposition: A | Discharge status: Home / Self Care | Payer: HMO | Attending: Gastroenterology | Admitting: Gastroenterology

## 2022-07-02 DIAGNOSIS — K449 Diaphragmatic hernia without obstruction or gangrene: Secondary | ICD-10-CM | POA: Insufficient documentation

## 2022-07-02 DIAGNOSIS — Z794 Long term (current) use of insulin: Secondary | ICD-10-CM | POA: Diagnosis not present

## 2022-07-02 DIAGNOSIS — E109 Type 1 diabetes mellitus without complications: Secondary | ICD-10-CM | POA: Insufficient documentation

## 2022-07-02 DIAGNOSIS — I10 Essential (primary) hypertension: Secondary | ICD-10-CM | POA: Insufficient documentation

## 2022-07-02 DIAGNOSIS — E119 Type 2 diabetes mellitus without complications: Secondary | ICD-10-CM

## 2022-07-02 DIAGNOSIS — K219 Gastro-esophageal reflux disease without esophagitis: Secondary | ICD-10-CM | POA: Insufficient documentation

## 2022-07-02 DIAGNOSIS — E785 Hyperlipidemia, unspecified: Secondary | ICD-10-CM | POA: Diagnosis not present

## 2022-07-02 DIAGNOSIS — Z8673 Personal history of transient ischemic attack (TIA), and cerebral infarction without residual deficits: Secondary | ICD-10-CM | POA: Insufficient documentation

## 2022-07-02 DIAGNOSIS — M1990 Unspecified osteoarthritis, unspecified site: Secondary | ICD-10-CM | POA: Insufficient documentation

## 2022-07-02 DIAGNOSIS — K2270 Barrett's esophagus without dysplasia: Secondary | ICD-10-CM

## 2022-07-02 DIAGNOSIS — Z7984 Long term (current) use of oral hypoglycemic drugs: Secondary | ICD-10-CM | POA: Diagnosis not present

## 2022-07-02 HISTORY — PX: BIOPSY: SHX5522

## 2022-07-02 HISTORY — PX: ESOPHAGOGASTRODUODENOSCOPY (EGD) WITH PROPOFOL: SHX5813

## 2022-07-02 LAB — GLUCOSE, CAPILLARY
Glucose-Capillary: 86 mg/dL (ref 70–99)
Glucose-Capillary: 174 mg/dL — ABNORMAL HIGH (ref 70–99)
Glucose-Capillary: 156 mg/dL — ABNORMAL HIGH (ref 70–99)

## 2022-07-02 SURGERY — ESOPHAGOGASTRODUODENOSCOPY (EGD) WITH PROPOFOL
Anesthesia: General

## 2022-07-02 MED ORDER — LACTATED RINGERS IV SOLN: INTRAVENOUS | Status: DC

## 2022-07-02 MED ORDER — DEXTROSE 50 % IV SOLN
INTRAVENOUS | Status: AC
  Filled 2022-07-02: qty 50

## 2022-07-02 MED ORDER — PROPOFOL 10 MG/ML IV BOLUS
INTRAVENOUS | Status: DC | PRN
  Administered 2022-07-02: 60 mg via INTRAVENOUS

## 2022-07-02 MED ORDER — DEXTROSE 50 % IV SOLN
12.5000 g | Freq: Once | INTRAVENOUS | Status: AC
  Administered 2022-07-02: 12.5 g via INTRAVENOUS

## 2022-07-02 MED ORDER — LIDOCAINE HCL (CARDIAC) PF 100 MG/5ML IV SOSY
PREFILLED_SYRINGE | INTRAVENOUS | Status: DC | PRN
  Administered 2022-07-02: 50 mg via INTRATRACHEAL

## 2022-07-02 NOTE — H&P (Signed)
Nancy Sutton is an 76 y.o. female.   Chief Complaint: Barrett's esophagus HPI: Nancy Sutton is a 76 y.o. female with past medical history of type 1 diabetes, hypertension hyperlipidemia, stroke on Plavix, GERD complicated by peptic esophageal stricture requiring dilations and short segment Barrett's esophagus without dysplasia, who presents for follow up of GERD and history BE.   The patient denies having any nausea, vomiting, fever, chills, hematochezia, melena, hematemesis, abdominal distention, abdominal pain, diarrhea, jaundice, pruritus or weight loss.  Past Medical History:  Diagnosis Date   Arthritis    Diabetes mellitus    since 2008   Essential hypertension, benign 09/10/2015   GERD (gastroesophageal reflux disease)    Hypercholesteremia    Hyperlipidemia    Stroke ([place]) 03/2011   only deficits-affected eyesight    Past Surgical History:  Procedure Laterality Date   CATARACT EXTRACTION W/PHACO  11/01/2011   Procedure: CATARACT EXTRACTION PHACO AND INTRAOCULAR LENS PLACEMENT ([place]);  Surgeon: Tonny Branch, MD;  Location: [facility];  Service: Ophthalmology;  Laterality: Right;  CDE 12.48   ESOPHAGEAL DILATION N/A 04/29/2017   Procedure: ESOPHAGEAL DILATION;  Surgeon: Rogene Houston, MD;  [facility];  Service: Endoscopy;  Laterality: N/A;   ESOPHAGOGASTRODUODENOSCOPY N/A 04/29/2017   Procedure: ESOPHAGOGASTRODUODENOSCOPY (EGD);  Surgeon: Rogene Houston, MD;  [facility];  Service: Endoscopy;  Laterality: N/A;  1015   ESOPHAGOGASTRODUODENOSCOPY (EGD) WITH ESOPHAGEAL DILATION N/A 02/14/2013   Procedure: ESOPHAGOGASTRODUODENOSCOPY (EGD) WITH ESOPHAGEAL DILATION;  Surgeon: Rogene Houston, MD;  [facility];  Service: Endoscopy;  Laterality: N/A;  200-moved to 255 Ann to notify pt   ESOPHAGOGASTRODUODENOSCOPY (EGD) WITH PROPOFOL N/A 05/28/2022   Procedure: ESOPHAGOGASTRODUODENOSCOPY (EGD) WITH PROPOFOL;  Surgeon: Harvel Quale, MD;  [facility]  [facility];  Service: Gastroenterology;  Laterality: N/A;  1030 ASA 2   EYE SURGERY  10-2010   left eye removal of cataract   TUBAL LIGATION  1984    Family History  Problem Relation Age of Onset   Cancer Mother    Diabetes Mother    Cancer Father    Diabetes Father    Anesthesia problems Neg Hx    Hypotension Neg Hx    Malignant hyperthermia Neg Hx    Pseudochol deficiency Neg Hx    Social History:  reports that she has never smoked. She has never used smokeless tobacco. She reports that she does not drink alcohol and does not use drugs.  Allergies:  Allergies  Allergen Reactions   Ampicillin Hives    Breaks out in bumps Has patient had a PCN reaction causing immediate rash, facial/tongue/throat swelling, SOB or lightheadedness with hypotension: No Has patient had a PCN reaction causing severe rash involving mucus membranes or skin necrosis: Yes Has patient had a PCN reaction that required hospitalization: No Has patient had a PCN reaction occurring within the last 10 years: No If all of the above answers are "NO", then may proceed with Cephalosporin use.    Bromday [Bromfenac Sodium] Itching and Swelling    Eye Drops   Lisinopril Cough   Tetanus Toxoids Hives    Breaks out in bumps    Medications Prior to Admission  Medication Sig Dispense Refill   calcium carbonate (TUMS - DOSED IN MG ELEMENTAL CALCIUM) 500 MG chewable tablet Chew 1 tablet by mouth at bedtime as needed for indigestion or heartburn.     cholecalciferol (VITAMIN D3) 25 MCG (1000 UNIT) tablet Take 1,000 Units by mouth  daily.     clopidogrel (PLAVIX) 75 MG tablet Take 1 tablet (75 mg total) by mouth daily. 90 tablet 3   Dulaglutide (TRULICITY) 1.5 BP/1.0CH SOPN Inject 1.5 mg into the skin once a week. (Patient taking differently: Inject 1.5 mg into the skin every Monday.) 6 mL 3   ezetimibe (ZETIA) 10 MG tablet Take 10 mg by mouth at bedtime.     insulin aspart (NOVOLOG FLEXPEN) 100 UNIT/ML FlexPen Inject  10-12 Units into the skin 3 (three) times daily with meals. (Patient taking differently: Inject 8-12 Units into the skin 3 (three) times daily with meals.) 30 mL 3   insulin detemir (LEVEMIR) 100 UNIT/ML FlexPen Inject 27 Units into the skin at bedtime. 30 mL 3   latanoprost (XALATAN) 0.005 % ophthalmic solution Place 1 drop into both eyes at bedtime.  11   Multiple Vitamin (ONE-A-DAY 55 PLUS PO) Take 1 tablet by mouth daily.      Multiple Vitamins-Minerals (HAIR SKIN AND NAILS FORMULA) TABS Take 1 tablet by mouth daily.     Omega-3 Fatty Acids (FISH OIL) 1000 MG CAPS Take 1,000 mg by mouth daily.     omeprazole (PRILOSEC) 40 MG capsule TAKE ONE CAPSULE BY MOUTH DAILY 90 capsule 3   simvastatin (ZOCOR) 80 MG tablet Take 80 mg by mouth at bedtime.     timolol (BETIMOL) 0.5 % ophthalmic solution Place 1 drop into both eyes 2 (two) times daily.     vitamin C (ASCORBIC ACID) 500 MG tablet Take 500 mg by mouth daily.     vitamin E 180 MG (400 UNITS) capsule Take 400 Units by mouth daily.     Blood Glucose Monitoring Suppl (ONE TOUCH ULTRA 2) w/Device KIT : Use as instructed 4 x daily 1 kit 0   GLOBAL EASE INJECT PEN NEEDLES 32G X 4 MM MISC USE 4 PENTIPS EVERY DAY 100 each 5   GLOBAL INJECT EASE LANCETS 30G MISC DIABETIC CLUB 100 each 12   Glucagon 3 MG/DOSE POWD Place 3 mg into the nose once as needed for up to 1 dose. (Patient not taking: Reported on 06/28/2022) 1 each 11   metFORMIN (GLUCOPHAGE) 500 MG tablet Take 500 mg by mouth daily with breakfast.     naproxen sodium (ALEVE) 220 MG tablet Take 220 mg by mouth daily as needed (arthritis pain).     ONETOUCH ULTRA test strip test blood sugar FOUR TIMES DAILY 400 strip 12    Results for orders placed or performed during the hospital encounter of 07/02/22 (from the past 48 hour(s))  Glucose, capillary     Status: None   Collection Time: 07/02/22  9:24 AM  Result Value Ref Range   Glucose-Capillary 86 70 - 99 mg/dL    Comment: Glucose reference  range applies only to samples taken after fasting for at least 8 hours.   No results found.  Review of Systems  All other systems reviewed and are negative.   Blood pressure (!) 189/54, pulse 79, temperature 97.7 F (36.5 C), temperature source Oral, resp. rate 20, height 4' 11"  (1.499 m), weight 54 kg, SpO2 99 %. Physical Exam  GENERAL: The patient is AO x3, in no acute distress. HEENT: Head is normocephalic and atraumatic. EOMI are intact. Mouth is well hydrated and without lesions. NECK: Supple. No masses LUNGS: Clear to auscultation. No presence of rhonchi/wheezing/rales. Adequate chest expansion HEART: RRR, normal s1 and s2. ABDOMEN: Soft, nontender, no guarding, no peritoneal signs, and nondistended. BS +. No  masses. EXTREMITIES: Without any cyanosis, clubbing, rash, lesions or edema. NEUROLOGIC: AOx3, no focal motor deficit. SKIN: no jaundice, no rashes  Assessment/Plan Nancy Sutton is a 76 y.o. female with past medical history of type 1 diabetes, hypertension hyperlipidemia, stroke on Plavix, GERD complicated by peptic esophageal stricture requiring dilations and short segment Barrett's esophagus without dysplasia, who presents for follow up of GERD and history BE. We will proceed with EGD.  Harvel Quale, MD 07/02/2022, 10:06 AM

## 2022-07-02 NOTE — Anesthesia Preprocedure Evaluation (Addendum)
Anesthesia Evaluation  Patient identified by MRN, date of birth, ID band Patient awake    Reviewed: Allergy & Precautions, NPO status , Patient's Chart, lab work & pertinent test results  Airway Mallampati: II  TM Distance: >3 FB Neck ROM: Full    Dental  (+) Missing, Loose,    Pulmonary neg pulmonary ROS,    Pulmonary exam normal breath sounds clear to auscultation       Cardiovascular hypertension, Pt. on medications negative cardio ROS   Rhythm:Regular Rate:Normal + Systolic murmurs    Neuro/Psych CVA negative psych ROS   GI/Hepatic Neg liver ROS, GERD  Medicated and Controlled,  Endo/Other  diabetes, Well Controlled, Type 2, Oral Hypoglycemic Agents, Insulin Dependent  Renal/GU negative Renal ROS  negative genitourinary   Musculoskeletal  (+) Arthritis , Osteoarthritis,    Abdominal   Peds negative pediatric ROS (+)  Hematology negative hematology ROS (+)   Anesthesia Other Findings   Reproductive/Obstetrics negative OB ROS                             Anesthesia Physical Anesthesia Plan  ASA: 3  Anesthesia Plan: General   Post-op Pain Management: Minimal or no pain anticipated   Induction: Intravenous  PONV Risk Score and Plan: Propofol infusion and TIVA  Airway Management Planned: Nasal Cannula and Natural Airway  Additional Equipment:   Intra-op Plan:   Post-operative Plan:   Informed Consent: I have reviewed the patients History and Physical, chart, labs and discussed the procedure including the risks, benefits and alternatives for the proposed anesthesia with the patient or authorized representative who has indicated his/her understanding and acceptance.       Plan Discussed with: CRNA and Surgeon  Anesthesia Plan Comments:        Anesthesia Quick Evaluation

## 2022-07-02 NOTE — Op Note (Signed)
[facility] Patient Name: Nancy Sutton Procedure Date: 07/02/2022 10:56 AM MRN: [id number] Date of Birth: Feb 06, 1946 Attending MD: Katrinka Blazing ,  CSN: [id number] Age: 76 Admit Type: Outpatient Procedure:                Upper GI endoscopy Indications:              Follow-up of Barrett's esophagus Providers:                Katrinka Blazing, Edrick Kins, RN, Lennice Sites                            Technician, Technician Referring MD:              Medicines:                Monitored Anesthesia Care Complications:            No immediate complications. Estimated Blood Loss:     Estimated blood loss: none. Procedure:                Pre-Anesthesia Assessment:                           - Prior to the procedure, a History and Physical                            was performed, and patient medications, allergies                            and sensitivities were reviewed. The patient's                            tolerance of previous anesthesia was reviewed.                           - The risks and benefits of the procedure and the                            sedation options and risks were discussed with the                            patient. All questions were answered and informed                            consent was obtained.                           - ASA Grade Assessment: II - A patient with mild                            systemic disease.                           After obtaining informed consent, the endoscope was                            passed under direct vision. Throughout the  procedure, the patient's blood pressure, pulse, and                            oxygen saturations were monitored continuously. The                            [id number]-[id number] ([id number]) scope was introduced through the                            mouth, and advanced to the second part of duodenum.                            The upper GI endoscopy was accomplished without                             difficulty. The patient tolerated the procedure                            well. Scope In: 11:10:10 AM Scope Out: 95:28:41 AM Total Procedure Duration: 0 hours 5 minutes 51 seconds  Findings:      The esophagus and gastroesophageal junction were examined with white       light and narrow band imaging (NBI). There were esophageal mucosal       changes consistent with short-segment Barrett's esophagus. These changes       involved the mucosa extending to the Z-line (31 cm from the incisors).       One tongue of salmon-colored mucosa was present from 31 to 32 cm. The       maximum longitudinal extent of these esophageal mucosal changes was 1 cm       in length. This was biopsied with a cold forceps for histology.      A 4 cm hiatal hernia was present.      The entire examined stomach was normal.      The examined duodenum was normal. Impression:               - Esophageal mucosal changes consistent with                            short-segment Barrett's esophagus. Biopsied.                           - 4 cm hiatal hernia.                           - Normal stomach.                           - Normal examined duodenum. Moderate Sedation:      Per Anesthesia Care Recommendation:           - Discharge patient to home (ambulatory).                           - Resume previous diet.                           - Await  pathology results.                           - Continue present medications. Procedure Code(s):        --- Professional ---                           [phone], Esophagogastroduodenoscopy, flexible,                            transoral; with biopsy, single or multiple Diagnosis Code(s):        --- Professional ---                           K22.70, Barrett's esophagus without dysplasia                           K44.9, Diaphragmatic hernia without obstruction or                            gangrene CPT copyright 2019 [employer]. All rights  reserved. The codes documented in this report are preliminary and upon coder review may  be revised to meet current compliance requirements. Nancy Peppers, MD Nancy Sutton,  07/02/2022 11:37:32 AM This report has been signed electronically. Number of Addenda: 0

## 2022-07-02 NOTE — Anesthesia Postprocedure Evaluation (Signed)
Anesthesia Post Note  Patient: SRISHTI STRNAD  Procedure(s) Performed: ESOPHAGOGASTRODUODENOSCOPY (EGD) WITH PROPOFOL BIOPSY  Patient location during evaluation: Phase II Anesthesia Type: General Level of consciousness: awake and alert and oriented Pain management: pain level controlled Vital Signs Assessment: post-procedure vital signs reviewed and stable Respiratory status: spontaneous breathing, nonlabored ventilation and respiratory function stable Cardiovascular status: blood pressure returned to baseline and stable Postop Assessment: no apparent nausea or vomiting Anesthetic complications: no   No notable events documented.   Last Vitals:  Vitals:   07/02/22 0933 07/02/22 1120  BP: (!) 189/54 (!) 136/54  Pulse:  94  Resp:  14  Temp:  (!) 36.4 C  SpO2:  100%    Last Pain:  Vitals:   07/02/22 1120  TempSrc: Oral  PainSc: 0-No pain                 Karalyne Nusser C Chakita Mcgraw

## 2022-07-02 NOTE — Transfer of Care (Signed)
Immediate Anesthesia Transfer of Care Note  Patient: Nancy Sutton  Procedure(s) Performed: ESOPHAGOGASTRODUODENOSCOPY (EGD) WITH PROPOFOL BIOPSY  Patient Location: Endoscopy Unit  Anesthesia Type:General  Level of Consciousness: awake, alert  and oriented  Airway & Oxygen Therapy: Patient Spontanous Breathing  Post-op Assessment: Report given to RN and Post -op Vital signs reviewed and stable  Post vital signs: Reviewed and stable  Last Vitals:  Vitals Value Taken Time  BP 136/54 07/02/22 1120  Temp 36.4 C 07/02/22 1120  Pulse 94 07/02/22 1120  Resp 14 07/02/22 1120  SpO2 100 % 07/02/22 1120    Last Pain:  Vitals:   07/02/22 1120  TempSrc: Oral  PainSc: 0-No pain      Patients Stated Pain Goal: 9 (97/53/00 5110)  Complications: No notable events documented.

## 2022-07-02 NOTE — Discharge Instructions (Addendum)
You are being discharged to home.  Resume your previous diet.  We are waiting for your pathology results.  Continue your present medications.  Restart Plavix today

## 2022-07-05 LAB — SURGICAL PATHOLOGY

## 2022-07-07 ENCOUNTER — Encounter (HOSPITAL_COMMUNITY): Payer: Self-pay | Admitting: Gastroenterology

## 2022-07-07 NOTE — Progress Notes (Signed)
Diabetes mellitus type I

## 2022-08-03 ENCOUNTER — Telehealth: Payer: Self-pay

## 2022-08-03 NOTE — Telephone Encounter (Signed)
Pt called to advise [employer] needs application faxed with patient ID number: [id number] and 2024 on application. Novolog and Levemir insulin.

## 2022-08-05 NOTE — Telephone Encounter (Signed)
Pt contacted and advised [facility] faxed a request for pt to call them at [phone]. Pt confirmed understanding.

## 2022-08-09 NOTE — Telephone Encounter (Signed)
Pt called to advise [employer] requested application be sent again to alternative fax [phone]. Fax sent to number provided.

## 2022-08-17 ENCOUNTER — Telehealth (INDEPENDENT_AMBULATORY_CARE_PROVIDER_SITE_OTHER): Payer: Self-pay | Admitting: *Deleted

## 2022-08-17 NOTE — Telephone Encounter (Signed)
Patient called in. She was scheduled for EGD in 05/2022 but ended up still had food in stomach because she was supposed to be on a 3 days liquid diet and was not. She states Nancy Sutton called her to rescheduled. She told her she had already pay $225 copay if she would have to pay this again and was told she did not. Pt stated Nancy Sutton told her if they ask for copay to tell them she already paid it with the 1st procedure. Patient had procedure done 06/2022 and yesterday received a bill for her coapy for $225. I advised we don't do billing from our office so I am not able to see what bill she received. She would like a call to discuss if possible. Thanks!

## 2022-08-19 NOTE — Telephone Encounter (Signed)
noted 

## 2022-08-19 NOTE — Telephone Encounter (Signed)
Thanks for the update

## 2022-08-26 NOTE — Telephone Encounter (Signed)
I called the patient today at the end of the day as well.  Unfortunately, she did not pick up the phone and I left a voice message describing the same situation mentioned in your previous message.  I advised her to call us if she has questions.  I emphasized that both procedures were completed, even though 1 had suboptimal visualization of the stomach.  I also explained that unless there is a history of gastroparesis, we do not recommend staying on liquid diet the day before upper endoscopies (the patient did not have a history of gastroparesis in her chart), so this was a new finding. I also explained that the decision for the charges comes by the billing department, neither our office administrator nor the staff nor I have the capability to waive the charges from the procedure.

## 2022-08-27 ENCOUNTER — Telehealth: Payer: Self-pay

## 2022-08-27 ENCOUNTER — Telehealth: Payer: Self-pay | Admitting: Gastroenterology

## 2022-08-27 NOTE — Telephone Encounter (Signed)
Pt contacted and advised patient assistance (Insulin Degludec) which is replacing her previously prescribed Levemir was delivered to the office and ready for pick up.

## 2022-08-27 NOTE — Telephone Encounter (Signed)
I tried calling today 2 times to the patient's listed phone number, as well as to her husband's phone number but could not get in touch with them.  I left a voice message asking for a call back and let us know which day after 4:30 PM she will be available so we can discuss her issues with the esophagogastroduodenospy billing.

## 2022-08-30 ENCOUNTER — Telehealth (INDEPENDENT_AMBULATORY_CARE_PROVIDER_SITE_OTHER): Payer: Self-pay | Admitting: *Deleted

## 2022-08-30 NOTE — Telephone Encounter (Signed)
Per Toniann Fail she took message and sent it to Dr. Levon Hedger.

## 2022-08-30 NOTE — Telephone Encounter (Signed)
I had an extensive discussion with the patient today.  I explained to her that it was not our call to determine if patient should take or not a procedure that was performed.  I explained to her that even though the first procedure had limitations given the presence of gastric contents, the policy was that she was going to discharge co-pay for the procedure that was performed.  He explained to me that she reached the clinic department at [facility] and spoke with Jon Gills (phone number [phone]) who stated that it was the doctor's decision whether the patient was being charged or not for the procedure (and if so, need to pay the co-pay).  I explained again that I did not have any decision making regarding billing.  [name], can you please discuss this with Jon Gills and see where [facility] Billing Dept stands regarding this case? Thanks,

## 2022-08-30 NOTE — Telephone Encounter (Signed)
Patient called back today and left vm asking for her to call her back at your convenience. She will be home rest of day and all day tomorrow.

## 2022-08-30 NOTE — Telephone Encounter (Signed)
Patient called and left voicemail that she missed a couple of calls from you. She said she has to go to [place] this morning to get her insulin and she would call back when she is home today or she would be available anytime tomorrow.   [phone]

## 2022-08-31 NOTE — Telephone Encounter (Signed)
Pt contacted and advised Novolog (3 boxes) and Pen needles (2 boxes) have been delivered and ready for pick up.

## 2022-08-31 NOTE — Telephone Encounter (Signed)
Thanks, possibly the person who provided the information Jon Gills) misunderstood, so its better to have all clear as the patient is getting more confused now. She states she is willing to pay but needs to have one answer from the organization [facility]) altogether

## 2022-08-31 NOTE — Telephone Encounter (Signed)
Patient picked up 3 boxes of Novolog and 2 boxes of pen needles (Patient Assistance) in office today.

## 2022-09-03 NOTE — Telephone Encounter (Signed)
The billing department has looked and it was billed and Dr.Castaneda states that this was not a cancelled procedure nor not incomplete.  There is another note that billing is looking at --Again she has said Lorenza Cambridge who no longer works at [employer] told her that she did not owe second copay--cannot attest she said or not but we cannot tell a patient that ever.  There are staff messages that also makes mention of an Alexis in patient billing. I think this is a hospital billing issue that will have to be determined if copay is waive for 2 complete procedures or advise how this correctly can be handled.  Email today with billing of hospital side----Co-pay is for OP hospital not provider procedure on the professional side.

## 2022-09-15 NOTE — Telephone Encounter (Signed)
Patient called back today regarding a bill of $225 she has received once more - please advise - ph# [phone]

## 2022-09-15 NOTE — Telephone Encounter (Signed)
Thanks for the update

## 2022-09-16 ENCOUNTER — Other Ambulatory Visit: Payer: Self-pay

## 2022-09-16 MED ORDER — TRULICITY 1.5 MG/0.5ML ~~LOC~~ SOAJ: 1.5000 mg | SUBCUTANEOUS | 6 refills | Status: DC

## 2022-09-24 NOTE — Telephone Encounter (Signed)
Patient left voicemail asking for [name] to call her stating she really needs to talk with you about a bill from endoscopy. States she has been getting calls from [facility] and was told  yesterday that you would get an email.   I did call the patient and let her know you were out of the office til next week.   [phone]

## 2022-10-01 ENCOUNTER — Encounter: Payer: Self-pay | Admitting: Internal Medicine

## 2022-10-01 ENCOUNTER — Ambulatory Visit (INDEPENDENT_AMBULATORY_CARE_PROVIDER_SITE_OTHER): Payer: PPO | Admitting: Internal Medicine

## 2022-10-01 VITALS — BP 132/74 | HR 81 | Ht 59.0 in | Wt 123.6 lb

## 2022-10-01 DIAGNOSIS — E139 Other specified diabetes mellitus without complications: Secondary | ICD-10-CM

## 2022-10-01 DIAGNOSIS — E785 Hyperlipidemia, unspecified: Secondary | ICD-10-CM | POA: Diagnosis not present

## 2022-10-01 LAB — POCT GLYCOSYLATED HEMOGLOBIN (HGB A1C): Hemoglobin A1C: 7.2 % — AB (ref 4.0–5.6)

## 2022-10-01 NOTE — Progress Notes (Signed)
Patient ID: Nancy Sutton, female   DOB: [birth year], 77 y.o.   MRN: [id number]  HPI: Nancy Sutton is a 77 y.o.-year-old female, returning for follow-up for LADA, dx as DM2 in 2008, insulin-dependent since 2015, uncontrolled, with complications (h/o stroke in 2012). Pt. previously saw Dr. Fransico Him and then Dr. Everardo All, but last visit with me 05/2022. She is here with her husband who offers part of the history.  Interim history: No increased urination, blurry vision, nausea, chest pain.  Reviewed HbA1c: Lab Results  Component Value Date   HGBA1C 6.9 (A) 05/27/2022   HGBA1C 7.0 (A) 01/21/2022   HGBA1C 7.0 (A) 10/21/2021   HGBA1C 6.7 (A) 05/14/2021   HGBA1C 7.5 (A) 01/21/2021   HGBA1C 8.7 (A) 10/20/2020   HGBA1C 8.0 (H) 07/20/2019   HGBA1C 8.3 (H) 04/17/2019   HGBA1C 9.0 (H) 11/29/2018   HGBA1C 8.1 (H) 08/23/2018   Pt is on a regimen of: - Metformin ER 500 mg 1x a day in am - Trulicity 1.5 mg weekly (PAP) - tried the 3 mg >> lost 18 lbs 2/2 diarrhea - Levemir 24 units at bedtime (PAP) >> Levemir (did not start [place] yet) 27 units daily - Novolog 8-10 units 3x a day, right before meals (PAP) >> 8-12 units 3x a day, 5-10 min before meals (tried 15 min but this was dropping her blood sugars too low)  Pt. Tried a CGM [employer]) >> allergy to the adhesive; Dexcom was not covered.  She checks her sugars 4x a day and they are: - am: 131-226 >> 114-226, 238 - 2h after b'fast: n/c - before lunch: 96-245 >> 51, 70, 97-209 - 2h after lunch: n/c - before dinner: 130-241 >> 76-229, 277 - 2h after dinner: n/c - bedtime: 88-251 >> 95, 123-249 - nighttime: n/c Lowest sugar was 27 >> ...88 >> 51; she has hypoglycemia awareness at 70.  Highest sugar was 251 >> 366.  Glucometer: One Touch ultra  - no CKD, last BUN/creatinine:  Lab Results  Component Value Date   BUN 28 (H) 10/20/2020   BUN 25 07/20/2019   CREATININE 0.86 10/20/2020   CREATININE 0.82 07/20/2019  She is off losartan 25 mg  daily.  Patient had labs with PCP in 01/2022.  I do not have these records.  Will request them today.  - + HL; last set of lipids available for review: Lab Results  Component Value Date   CHOL 260 (H) 10/11/2016   HDL 96 10/11/2016   LDLCALC 144 (H) 10/11/2016   TRIG 98 10/11/2016   CHOLHDL 2.7 10/11/2016  She is on Zocor 80 mg daily, Zetia 10 mg daily.  - last eye exam was in 06/2022. No DR reportedly. [employer]. Glaucoma suspect.  - no numbness and tingling in her feet.  Last foot exam 04/2021.  She has a history of HTN, OA, GERD.  ROS: + see HPI  Past Medical History:  Diagnosis Date   Arthritis    Diabetes mellitus    since 2008   Essential hypertension, benign 09/10/2015   GERD (gastroesophageal reflux disease)    Hypercholesteremia    Hyperlipidemia    Stroke ([facility]) 03/2011   only deficits-affected eyesight   Past Surgical History:  Procedure Laterality Date   BIOPSY  07/02/2022   Procedure: BIOPSY;  Surgeon: Dolores Frame, MD;  [facility]: [facility];  Service: Gastroenterology;;   CATARACT EXTRACTION W/PHACO  11/01/2011   Procedure: CATARACT EXTRACTION PHACO AND INTRAOCULAR LENS PLACEMENT (IOC);  Surgeon: Gemma Payor, MD;  Location: [facility];  Service: Ophthalmology;  Laterality: Right;  CDE 12.48   ESOPHAGEAL DILATION N/A 04/29/2017   Procedure: ESOPHAGEAL DILATION;  Surgeon: Malissa Hippo, MD;  Location: [facility];  Service: Endoscopy;  Laterality: N/A;   ESOPHAGOGASTRODUODENOSCOPY N/A 04/29/2017   Procedure: ESOPHAGOGASTRODUODENOSCOPY (EGD);  Surgeon: Malissa Hippo, MD;  [facility]: [facility];  Service: Endoscopy;  Laterality: N/A;  1015   ESOPHAGOGASTRODUODENOSCOPY (EGD) WITH ESOPHAGEAL DILATION N/A 02/14/2013   Procedure: ESOPHAGOGASTRODUODENOSCOPY (EGD) WITH ESOPHAGEAL DILATION;  Surgeon: Malissa Hippo, MD;  [facility]: [facility];  Service: Endoscopy;  Laterality: N/A;  200-moved to 255 Ann to notify pt    ESOPHAGOGASTRODUODENOSCOPY (EGD) WITH PROPOFOL N/A 05/28/2022   Procedure: ESOPHAGOGASTRODUODENOSCOPY (EGD) WITH PROPOFOL;  Surgeon: Dolores Frame, MD;  [facility];  Service: Gastroenterology;  Laterality: N/A;  1030 ASA 2   ESOPHAGOGASTRODUODENOSCOPY (EGD) WITH PROPOFOL N/A 07/02/2022   Procedure: ESOPHAGOGASTRODUODENOSCOPY (EGD) WITH PROPOFOL;  Surgeon: Dolores Frame, MD;  [facility]: [facility];  Service: Gastroenterology;  Laterality: N/A;  105 ASA 2, pt knows new time per Soledad Gerlach   EYE SURGERY  10-2010   left eye removal of cataract   TUBAL LIGATION  1984   Social History   Socioeconomic History   Marital status: Married    Spouse name: wayne   Number of children: 2   Years of education: 12   Highest education level: Not on file  Occupational History   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   Patient is right handed.   Patient has hs education.   Patient drinks 3-5 glasses of tea daily.   Social Determinants of Health   Financial Resource Strain: Not on file  Food Insecurity: Not on file  Transportation Needs: Not on file  Physical Activity: Not on file  Stress: Not on file  Social Connections: Not on file  Intimate Partner Violence: Not on file   Current Outpatient Medications on File Prior to Visit  Medication Sig Dispense Refill   Blood Glucose Monitoring Suppl (ONE TOUCH ULTRA 2) w/Device KIT : Use as instructed 4 x daily 1 kit 0   calcium carbonate (TUMS - DOSED IN MG ELEMENTAL CALCIUM) 500 MG chewable tablet Chew 1 tablet by  mouth at bedtime as needed for indigestion or heartburn.     cholecalciferol (VITAMIN D3) 25 MCG (1000 UNIT) tablet Take 1,000 Units by mouth daily.     clopidogrel (PLAVIX) 75 MG tablet Take 1 tablet (75 mg total) by mouth daily. 90 tablet 3   Dulaglutide (TRULICITY) 1.5 MG/0.5ML SOPN Inject 1.5 mg into the skin once a week. 6 mL 6   ezetimibe (ZETIA) 10 MG tablet Take 10 mg by mouth at bedtime.     GLOBAL EASE INJECT PEN NEEDLES 32G X 4 MM MISC USE 4 PENTIPS EVERY DAY 100 each 5   GLOBAL INJECT EASE LANCETS 30G MISC DIABETIC CLUB 100 each 12   Glucagon 3 MG/DOSE POWD Place  3 mg into the nose once as needed for up to 1 dose. (Patient not taking: Reported on 06/28/2022) 1 each 11   insulin aspart (NOVOLOG FLEXPEN) 100 UNIT/ML FlexPen Inject 10-12 Units into the skin 3 (three) times daily with meals. (Patient taking differently: Inject 8-12 Units into the skin 3 (three) times daily with meals.) 30 mL 3   insulin detemir (LEVEMIR) 100 UNIT/ML FlexPen Inject 27 Units into the skin at bedtime. 30 mL 3   latanoprost (XALATAN) 0.005 % ophthalmic solution Place 1 drop into both eyes at bedtime.  11   metFORMIN (GLUCOPHAGE) 500 MG tablet Take 500 mg by mouth daily with breakfast.     Multiple Vitamin (ONE-A-DAY 55 PLUS PO) Take 1 tablet by mouth daily.      Multiple Vitamins-Minerals (HAIR SKIN AND NAILS FORMULA) TABS Take 1 tablet by mouth daily.     naproxen sodium (ALEVE) 220 MG tablet Take 220 mg by mouth daily as needed (arthritis pain).     Omega-3 Fatty Acids (FISH OIL) 1000 MG CAPS Take 1,000 mg by mouth daily.     omeprazole (PRILOSEC) 40 MG capsule TAKE ONE CAPSULE BY MOUTH DAILY 90 capsule 3   ONETOUCH ULTRA test strip test blood sugar FOUR TIMES DAILY 400 strip 12   simvastatin (ZOCOR) 80 MG tablet Take 80 mg by mouth at bedtime.     timolol (BETIMOL) 0.5 % ophthalmic solution Place 1 drop into both eyes 2 (two) times daily.     vitamin C (ASCORBIC ACID) 500 MG tablet Take 500 mg by mouth  daily.     vitamin E 180 MG (400 UNITS) capsule Take 400 Units by mouth daily.     No current facility-administered medications on file prior to visit.   Allergies  Allergen Reactions   Ampicillin Hives    Breaks out in bumps Has patient had a PCN reaction causing immediate rash, facial/tongue/throat swelling, SOB or lightheadedness with hypotension: No Has patient had a PCN reaction causing severe rash involving mucus membranes or skin necrosis: Yes Has patient had a PCN reaction that required hospitalization: No Has patient had a PCN reaction occurring within the last 10 years: No If all of the above answers are "NO", then may proceed with Cephalosporin use.    Bromday [Bromfenac Sodium] Itching and Swelling    Eye Drops   Lisinopril Cough   Tetanus Toxoids Hives    Breaks out in bumps   Family History  Problem Relation Age of Onset   Cancer Mother    Diabetes Mother    Cancer Father    Diabetes Father    Anesthesia problems Neg Hx    Hypotension Neg Hx    Malignant hyperthermia Neg Hx    Pseudochol deficiency Neg Hx    PE: BP 132/74 (BP Location: Left Arm, Patient Position: Sitting, Cuff Size: Normal)   Pulse 81   Ht 4\' 11"  (1.499 m)   Wt 123 lb 9.6 oz (56.1 kg)   SpO2 98%   BMI 24.96 kg/m  Wt Readings from Last 3 Encounters:  10/01/22 123 lb 9.6 oz (56.1 kg)  07/02/22 119 lb (54 kg)  05/27/22 119 lb 6.4 oz (54.2 kg)   Constitutional: normal weight, in NAD Eyes: no exophthalmos ENT: no thyromegaly, no cervical lymphadenopathy Cardiovascular: RRR, No MRG Respiratory: CTA B Musculoskeletal: no deformities Skin: no rashes Neurological: no tremor with outstretched hands Diabetic Foot Exam - Simple   Simple Foot Form Diabetic Foot exam was performed with the  following findings: Yes 10/01/2022 11:01 AM  Visual Inspection No deformities, no ulcerations, no other skin breakdown bilaterally: Yes Sensation Testing Intact to touch and monofilament testing  bilaterally: Yes Pulse Check Posterior Tibialis and Dorsalis pulse intact bilaterally: Yes Comments + medial B bunion    ASSESSMENT: 1. LADA, insulin-dependent, uncontrolled, with complications - Cerebrovascular disease - h/o stroke (affecting eyesight only)  Component     Latest Ref Rng 05/27/2022  Glucose     65 - 99 mg/dL 277 (H)   Hemoglobin O2U     4.0 - 5.6 % 6.9 !   Islet Cell Ab     Neg:<1:1  Negative   C-Peptide     0.80 - 3.85 ng/mL 1.85   ZNT8 Antibodies     <15 U/mL <10   Glutamic Acid Decarb Ab     <5 IU/mL >250 (H)    Normal insulin production, but elevated GAD Ab's.  These labs confirm LADA, which explains her blood sugar variability.   2. HL  PLAN:  1. Patient with longstanding, uncontrolled, diabetes, which we diagnosed as LADA at last visit, when GAD antibodies returned undetectably high.  Her insulin production was adequate.  At that time, HbA1c was 6.9%, lower than before, but actually lower than expected from her log.  She kept an excellent log, checking blood sugars 4 times a day, before meals and at bedtime.  The sugars are fluctuating significantly between 80s and 200s with the majority of the blood sugars above goal.  We continued the same dose of Trulicity since she had diarrhea with higher doses but I did advise him to increase [place] and NovoLog.  I also advised her to wait 15 minutes between the insulin doses and her meals. -At today's visit, reviewing her blood sugars at home, they remain fluctuating, usually above target.  She cannot take the NovoLog 15 minutes before meals, but 5 to 10 minutes before.  This is acceptable for now.  I did advise her to continue metformin and Trulicity and to increase the dose of Levemir (she did not switch to [place] yet as she has enough Levemir at home) and I also advised her that with larger meals, she may need to take a slightly higher dose of NovoLog.  We also discussed that at bedtime, if sugars are higher than 200  she may take a low-dose of NovoLog for correction, 3 to 4 units. - I suggested to:  Patient Instructions  Please continue: - Metformin ER 500 mg 1x a day - Trulicity 1.5 mg weekly  Change: - Levemir 29 units at bedtime - Novolog 8-14 units 3x a day, move it 15 min before meals  At bedtime, if sugars are >200, may take 3-4 units of Novolog for correction.  Please return in 3-4 months with your sugar log.   - we checked her HbA1c: 7.3% (igher) - advised to check sugars at different times of the day - 4x a day, rotating check times - advised for yearly eye exams >> she is UTD - return to clinic in 3-4 months   2. HL -Reviewed latest lipid panel from 2019: LDL above target, otherwise fractions at goal: Lab Results  Component Value Date   CHOL 260 (H) 10/11/2016   HDL 96 10/11/2016   LDLCALC 144 (H) 10/11/2016   TRIG 98 10/11/2016   CHOLHDL 2.7 10/11/2016  -She continues on Zocor 40 mg daily and Zetia 10 mg daily without side effects. - she had another lipid panel with  Dr. Gerarda Fraction in 01/2022 -I do not have these records, but will request them today   Philemon Kingdom, MD PhD [facility] Endocrinology

## 2022-10-01 NOTE — Patient Instructions (Addendum)
Please continue: - Metformin ER 500 mg 1x a day - Trulicity 1.5 mg weekly  Change: - Levemir 29 units at bedtime - Novolog 8-14 units 3x a day, move it 15 min before meals  St bedtime, if sugars are >200, may take 3-4 units of Novolog for correction.  Please return in 3-4 months with your sugar log.

## 2022-10-06 ENCOUNTER — Telehealth: Payer: Self-pay | Admitting: *Deleted

## 2022-10-06 NOTE — Telephone Encounter (Signed)
I called patient back had to leave a voice mail to let her know I have been out with PAL and then sickness.  I let her know I was sorry I had not called her back 10/05/2022 but I had reached out to someone that may can help with understanding with her bill and they will be calling this afternoon.

## 2022-10-15 ENCOUNTER — Telehealth (INDEPENDENT_AMBULATORY_CARE_PROVIDER_SITE_OTHER): Payer: Self-pay | Admitting: *Deleted

## 2022-10-15 NOTE — Telephone Encounter (Signed)
Patient left vm asking for [name] to call her. I called patient back and let her know that I spoke with you about her bill and let her know someone from hospital was trying to reach out to her but unable to get her. She said she spoke with someone but does not remember the name of the person. She said she is stuck in the middle because she has been told its a hospital bill by our office and hospital is saying its Dr. Darcey Nora call and now she reports she was told that the procedure in September was a complete success and the one in October was for a new issue she was having which she says is not the case. She said she went a head and paid the bill. I let her know that [name] had told me she was not handling this issue anymore it was the hospital but she still asked if [name] would call her.

## 2022-11-23 DIAGNOSIS — H6123 Impacted cerumen, bilateral: Secondary | ICD-10-CM | POA: Diagnosis not present

## 2022-11-23 DIAGNOSIS — H903 Sensorineural hearing loss, bilateral: Secondary | ICD-10-CM | POA: Diagnosis not present

## 2022-12-08 ENCOUNTER — Inpatient Hospital Stay (HOSPITAL_COMMUNITY)
Admission: EM | Admit: 2022-12-08 | Discharge: 2022-12-11 | DRG: 322 | Disposition: A | Discharge status: Home / Self Care | Payer: PPO | Attending: Internal Medicine | Admitting: Internal Medicine

## 2022-12-08 ENCOUNTER — Encounter (HOSPITAL_COMMUNITY): Payer: Self-pay | Admitting: Radiology

## 2022-12-08 ENCOUNTER — Emergency Department (HOSPITAL_COMMUNITY): Payer: PPO

## 2022-12-08 ENCOUNTER — Other Ambulatory Visit: Payer: Self-pay

## 2022-12-08 DIAGNOSIS — R7989 Other specified abnormal findings of blood chemistry: Secondary | ICD-10-CM | POA: Diagnosis not present

## 2022-12-08 DIAGNOSIS — I639 Cerebral infarction, unspecified: Secondary | ICD-10-CM | POA: Diagnosis not present

## 2022-12-08 DIAGNOSIS — N179 Acute kidney failure, unspecified: Secondary | ICD-10-CM | POA: Diagnosis present

## 2022-12-08 DIAGNOSIS — K219 Gastro-esophageal reflux disease without esophagitis: Secondary | ICD-10-CM | POA: Diagnosis present

## 2022-12-08 DIAGNOSIS — I480 Paroxysmal atrial fibrillation: Secondary | ICD-10-CM | POA: Insufficient documentation

## 2022-12-08 DIAGNOSIS — Z880 Allergy status to penicillin: Secondary | ICD-10-CM

## 2022-12-08 DIAGNOSIS — Z7902 Long term (current) use of antithrombotics/antiplatelets: Secondary | ICD-10-CM | POA: Diagnosis not present

## 2022-12-08 DIAGNOSIS — E1165 Type 2 diabetes mellitus with hyperglycemia: Secondary | ICD-10-CM | POA: Diagnosis present

## 2022-12-08 DIAGNOSIS — I25118 Atherosclerotic heart disease of native coronary artery with other forms of angina pectoris: Secondary | ICD-10-CM | POA: Diagnosis present

## 2022-12-08 DIAGNOSIS — Z7985 Long-term (current) use of injectable non-insulin antidiabetic drugs: Secondary | ICD-10-CM | POA: Diagnosis not present

## 2022-12-08 DIAGNOSIS — R739 Hyperglycemia, unspecified: Secondary | ICD-10-CM

## 2022-12-08 DIAGNOSIS — I214 Non-ST elevation (NSTEMI) myocardial infarction: Secondary | ICD-10-CM | POA: Diagnosis not present

## 2022-12-08 DIAGNOSIS — E139 Other specified diabetes mellitus without complications: Secondary | ICD-10-CM | POA: Diagnosis present

## 2022-12-08 DIAGNOSIS — I6529 Occlusion and stenosis of unspecified carotid artery: Secondary | ICD-10-CM | POA: Diagnosis present

## 2022-12-08 DIAGNOSIS — D649 Anemia, unspecified: Secondary | ICD-10-CM | POA: Diagnosis not present

## 2022-12-08 DIAGNOSIS — R1110 Vomiting, unspecified: Secondary | ICD-10-CM | POA: Diagnosis not present

## 2022-12-08 DIAGNOSIS — E876 Hypokalemia: Secondary | ICD-10-CM | POA: Diagnosis present

## 2022-12-08 DIAGNOSIS — K8020 Calculus of gallbladder without cholecystitis without obstruction: Secondary | ICD-10-CM | POA: Diagnosis present

## 2022-12-08 DIAGNOSIS — E7800 Pure hypercholesterolemia, unspecified: Secondary | ICD-10-CM | POA: Diagnosis present

## 2022-12-08 DIAGNOSIS — Z8673 Personal history of transient ischemic attack (TIA), and cerebral infarction without residual deficits: Secondary | ICD-10-CM | POA: Diagnosis not present

## 2022-12-08 DIAGNOSIS — Z886 Allergy status to analgesic agent status: Secondary | ICD-10-CM | POA: Diagnosis not present

## 2022-12-08 DIAGNOSIS — I2510 Atherosclerotic heart disease of native coronary artery without angina pectoris: Secondary | ICD-10-CM | POA: Diagnosis not present

## 2022-12-08 DIAGNOSIS — Z833 Family history of diabetes mellitus: Secondary | ICD-10-CM

## 2022-12-08 DIAGNOSIS — E1365 Other specified diabetes mellitus with hyperglycemia: Secondary | ICD-10-CM | POA: Diagnosis present

## 2022-12-08 DIAGNOSIS — Z887 Allergy status to serum and vaccine status: Secondary | ICD-10-CM

## 2022-12-08 DIAGNOSIS — E1065 Type 1 diabetes mellitus with hyperglycemia: Secondary | ICD-10-CM | POA: Diagnosis not present

## 2022-12-08 DIAGNOSIS — I69398 Other sequelae of cerebral infarction: Secondary | ICD-10-CM

## 2022-12-08 DIAGNOSIS — I4891 Unspecified atrial fibrillation: Secondary | ICD-10-CM | POA: Diagnosis not present

## 2022-12-08 DIAGNOSIS — E785 Hyperlipidemia, unspecified: Secondary | ICD-10-CM | POA: Diagnosis present

## 2022-12-08 DIAGNOSIS — M1990 Unspecified osteoarthritis, unspecified site: Secondary | ICD-10-CM | POA: Diagnosis present

## 2022-12-08 DIAGNOSIS — Z888 Allergy status to other drugs, medicaments and biological substances status: Secondary | ICD-10-CM

## 2022-12-08 DIAGNOSIS — R1111 Vomiting without nausea: Principal | ICD-10-CM

## 2022-12-08 DIAGNOSIS — Z7982 Long term (current) use of aspirin: Secondary | ICD-10-CM

## 2022-12-08 DIAGNOSIS — H53462 Homonymous bilateral field defects, left side: Secondary | ICD-10-CM | POA: Diagnosis present

## 2022-12-08 DIAGNOSIS — Z794 Long term (current) use of insulin: Secondary | ICD-10-CM | POA: Diagnosis not present

## 2022-12-08 DIAGNOSIS — Z79899 Other long term (current) drug therapy: Secondary | ICD-10-CM | POA: Diagnosis not present

## 2022-12-08 DIAGNOSIS — R112 Nausea with vomiting, unspecified: Secondary | ICD-10-CM | POA: Diagnosis not present

## 2022-12-08 DIAGNOSIS — Z7984 Long term (current) use of oral hypoglycemic drugs: Secondary | ICD-10-CM | POA: Diagnosis not present

## 2022-12-08 DIAGNOSIS — I10 Essential (primary) hypertension: Secondary | ICD-10-CM | POA: Diagnosis present

## 2022-12-08 DIAGNOSIS — K2270 Barrett's esophagus without dysplasia: Secondary | ICD-10-CM | POA: Diagnosis present

## 2022-12-08 LAB — COMPREHENSIVE METABOLIC PANEL
ALT: 21 U/L (ref 0–44)
AST: 34 U/L (ref 15–41)
Albumin: 4.4 g/dL (ref 3.5–5.0)
Alkaline Phosphatase: 63 U/L (ref 38–126)
Anion gap: 14 (ref 5–15)
BUN: 31 mg/dL — ABNORMAL HIGH (ref 8–23)
CO2: 25 mmol/L (ref 22–32)
Calcium: 10.1 mg/dL (ref 8.9–10.3)
Chloride: 95 mmol/L — ABNORMAL LOW (ref 98–111)
Creatinine, Ser: 1.16 mg/dL — ABNORMAL HIGH (ref 0.44–1.00)
GFR, Estimated: 49 mL/min — ABNORMAL LOW (ref >60)
Glucose, Bld: 453 mg/dL — ABNORMAL HIGH (ref 70–99)
Potassium: 3.5 mmol/L (ref 3.5–5.1)
Sodium: 134 mmol/L — ABNORMAL LOW (ref 135–145)
Total Bilirubin: 1.1 mg/dL (ref 0.3–1.2)
Total Protein: 8.5 g/dL — ABNORMAL HIGH (ref 6.5–8.1)

## 2022-12-08 LAB — CBC WITH DIFFERENTIAL/PLATELET
Abs Immature Granulocytes: 0.04 10*3/uL (ref 0.00–0.07)
Basophils Absolute: 0 10*3/uL (ref 0.0–0.1)
Basophils Relative: 0 %
Eosinophils Absolute: 0 10*3/uL (ref 0.0–0.5)
Eosinophils Relative: 0 %
HCT: 36.2 % (ref 36.0–46.0)
Hemoglobin: 12.1 g/dL (ref 12.0–15.0)
Immature Granulocytes: 0 %
Lymphocytes Relative: 10 %
Lymphs Abs: 1 10*3/uL (ref 0.7–4.0)
MCH: 29.3 pg (ref 26.0–34.0)
MCHC: 33.4 g/dL (ref 30.0–36.0)
MCV: 87.7 fL (ref 80.0–100.0)
Monocytes Absolute: 0.5 10*3/uL (ref 0.1–1.0)
Monocytes Relative: 6 %
Neutro Abs: 8 10*3/uL — ABNORMAL HIGH (ref 1.7–7.7)
Neutrophils Relative %: 84 %
Platelets: 314 10*3/uL (ref 150–400)
RBC: 4.13 MIL/uL (ref 3.87–5.11)
RDW: 13.2 % (ref 11.5–15.5)
WBC: 9.5 10*3/uL (ref 4.0–10.5)
nRBC: 0 % (ref 0.0–0.2)

## 2022-12-08 LAB — BLOOD GAS, VENOUS
Acid-Base Excess: 3.2 mmol/L — ABNORMAL HIGH (ref 0.0–2.0)
Bicarbonate: 27.9 mmol/L (ref 20.0–28.0)
Drawn by: 4237
O2 Saturation: 61.7 %
Patient temperature: 36.8
pCO2, Ven: 42 mmHg — ABNORMAL LOW (ref 44–60)
pH, Ven: 7.43 (ref 7.25–7.43)
pO2, Ven: 34 mmHg (ref 32–45)

## 2022-12-08 LAB — GLUCOSE, CAPILLARY
Glucose-Capillary: 254 mg/dL — ABNORMAL HIGH (ref 70–99)
Glucose-Capillary: 282 mg/dL — ABNORMAL HIGH (ref 70–99)

## 2022-12-08 LAB — TROPONIN I (HIGH SENSITIVITY)
Troponin I (High Sensitivity): 253 ng/L (ref <18)
Troponin I (High Sensitivity): 823 ng/L (ref <18)
Troponin I (High Sensitivity): 740 ng/L (ref <18)

## 2022-12-08 LAB — HEPARIN LEVEL (UNFRACTIONATED): Heparin Unfractionated: 0.47 IU/mL (ref 0.30–0.70)

## 2022-12-08 LAB — LIPASE, BLOOD: Lipase: 27 U/L (ref 11–51)

## 2022-12-08 MED ORDER — CLOPIDOGREL BISULFATE 75 MG PO TABS
75.0000 mg | ORAL_TABLET | Freq: Every day | ORAL | Status: DC
  Administered 2022-12-08 – 2022-12-11 (×4): 75 mg via ORAL
  Filled 2022-12-08 (×4): qty 1

## 2022-12-08 MED ORDER — INSULIN DETEMIR 100 UNIT/ML ~~LOC~~ SOLN
10.0000 [IU] | Freq: Every day | SUBCUTANEOUS | Status: DC
  Administered 2022-12-08 – 2022-12-10 (×3): 10 [IU] via SUBCUTANEOUS
  Filled 2022-12-08 (×4): qty 0.1

## 2022-12-08 MED ORDER — IOHEXOL 300 MG/ML  SOLN
100.0000 mL | Freq: Once | INTRAMUSCULAR | Status: AC | PRN
  Administered 2022-12-08: 100 mL via INTRAVENOUS

## 2022-12-08 MED ORDER — ONDANSETRON HCL 4 MG/2ML IJ SOLN
4.0000 mg | Freq: Once | INTRAMUSCULAR | Status: AC
  Administered 2022-12-08: 4 mg via INTRAVENOUS
  Filled 2022-12-08: qty 2

## 2022-12-08 MED ORDER — INSULIN ASPART 100 UNIT/ML IJ SOLN: 0.0000 [IU] | INTRAMUSCULAR | Status: DC

## 2022-12-08 MED ORDER — HYDRALAZINE HCL 20 MG/ML IJ SOLN
10.0000 mg | Freq: Four times a day (QID) | INTRAMUSCULAR | Status: DC | PRN
  Administered 2022-12-08: 10 mg via INTRAVENOUS
  Filled 2022-12-08: qty 1

## 2022-12-08 MED ORDER — INSULIN ASPART 100 UNIT/ML IJ SOLN
8.0000 [IU] | Freq: Once | INTRAMUSCULAR | Status: AC
  Administered 2022-12-08: 8 [IU] via SUBCUTANEOUS
  Filled 2022-12-08: qty 1

## 2022-12-08 MED ORDER — HYDRALAZINE HCL 20 MG/ML IJ SOLN: 20.0000 mg | Freq: Four times a day (QID) | INTRAMUSCULAR | Status: DC | PRN

## 2022-12-08 MED ORDER — ACETAMINOPHEN 650 MG RE SUPP: 650.0000 mg | Freq: Four times a day (QID) | RECTAL | Status: DC | PRN

## 2022-12-08 MED ORDER — METOCLOPRAMIDE HCL 5 MG/ML IJ SOLN
10.0000 mg | Freq: Three times a day (TID) | INTRAMUSCULAR | Status: DC | PRN
  Administered 2022-12-08 – 2022-12-10 (×3): 10 mg via INTRAVENOUS
  Filled 2022-12-08 (×3): qty 2

## 2022-12-08 MED ORDER — ONDANSETRON HCL 4 MG PO TABS: 4.0000 mg | ORAL_TABLET | Freq: Four times a day (QID) | ORAL | Status: DC | PRN

## 2022-12-08 MED ORDER — HEPARIN BOLUS VIA INFUSION
3000.0000 [IU] | Freq: Once | INTRAVENOUS | Status: AC
  Administered 2022-12-08: 3000 [IU] via INTRAVENOUS

## 2022-12-08 MED ORDER — NITROGLYCERIN 2 % TD OINT
1.0000 [in_us] | TOPICAL_OINTMENT | Freq: Four times a day (QID) | TRANSDERMAL | Status: DC
  Administered 2022-12-08 – 2022-12-10 (×8): 1 [in_us] via TOPICAL
  Filled 2022-12-08 (×8): qty 1

## 2022-12-08 MED ORDER — POLYETHYLENE GLYCOL 3350 17 G PO PACK: 17.0000 g | PACK | Freq: Every day | ORAL | Status: DC | PRN

## 2022-12-08 MED ORDER — PANTOPRAZOLE SODIUM 40 MG PO TBEC
40.0000 mg | DELAYED_RELEASE_TABLET | Freq: Every day | ORAL | Status: DC
  Administered 2022-12-08 – 2022-12-09 (×2): 40 mg via ORAL
  Filled 2022-12-08 (×2): qty 1

## 2022-12-08 MED ORDER — SODIUM CHLORIDE 0.9 % IV SOLN
12.5000 mg | Freq: Four times a day (QID) | INTRAVENOUS | Status: DC | PRN
  Administered 2022-12-08: 12.5 mg via INTRAVENOUS
  Filled 2022-12-08: qty 0.5

## 2022-12-08 MED ORDER — POTASSIUM CHLORIDE IN NACL 40-0.9 MEQ/L-% IV SOLN
INTRAVENOUS | Status: AC
  Filled 2022-12-08 (×3): qty 1000

## 2022-12-08 MED ORDER — ONDANSETRON HCL 4 MG/2ML IJ SOLN: 4.0000 mg | Freq: Four times a day (QID) | INTRAMUSCULAR | Status: DC | PRN

## 2022-12-08 MED ORDER — SODIUM CHLORIDE 0.9 % IV SOLN: INTRAVENOUS | Status: DC

## 2022-12-08 MED ORDER — HEPARIN (PORCINE) 25000 UT/250ML-% IV SOLN
700.0000 [IU]/h | INTRAVENOUS | Status: DC
  Administered 2022-12-08 – 2022-12-09 (×2): 700 [IU]/h via INTRAVENOUS
  Filled 2022-12-08 (×2): qty 250

## 2022-12-08 MED ORDER — INSULIN ASPART 100 UNIT/ML IJ SOLN
0.0000 [IU] | INTRAMUSCULAR | Status: DC
  Administered 2022-12-08: 5 [IU] via SUBCUTANEOUS
  Administered 2022-12-09: 3 [IU] via SUBCUTANEOUS
  Administered 2022-12-09 (×3): 2 [IU] via SUBCUTANEOUS
  Administered 2022-12-09: 5 [IU] via SUBCUTANEOUS
  Administered 2022-12-09: 2 [IU] via SUBCUTANEOUS
  Administered 2022-12-10 (×2): 1 [IU] via SUBCUTANEOUS
  Administered 2022-12-10: 2 [IU] via SUBCUTANEOUS

## 2022-12-08 MED ORDER — ACETAMINOPHEN 325 MG PO TABS
650.0000 mg | ORAL_TABLET | Freq: Four times a day (QID) | ORAL | Status: DC | PRN
  Administered 2022-12-10 – 2022-12-11 (×2): 650 mg via ORAL
  Filled 2022-12-08 (×2): qty 2

## 2022-12-08 MED ORDER — ATORVASTATIN CALCIUM 40 MG PO TABS
40.0000 mg | ORAL_TABLET | Freq: Every day | ORAL | Status: DC
  Administered 2022-12-08 – 2022-12-11 (×4): 40 mg via ORAL
  Filled 2022-12-08 (×4): qty 1

## 2022-12-08 MED ORDER — SODIUM CHLORIDE 0.9 % IV BOLUS
1000.0000 mL | Freq: Once | INTRAVENOUS | Status: AC
  Administered 2022-12-08: 1000 mL via INTRAVENOUS

## 2022-12-08 NOTE — Progress Notes (Signed)
ANTICOAGULATION CONSULT NOTE - Initial Consult  Pharmacy Consult for heparin Indication: chest pain/ACS  Allergies  Allergen Reactions   Ampicillin Hives    Breaks out in bumps Has patient had a PCN reaction causing immediate rash, facial/tongue/throat swelling, SOB or lightheadedness with hypotension: No Has patient had a PCN reaction causing severe rash involving mucus membranes or skin necrosis: Yes Has patient had a PCN reaction that required hospitalization: No Has patient had a PCN reaction occurring within the last 10 years: No If all of the above answers are "NO", then may proceed with Cephalosporin use.    Bromday [Bromfenac Sodium] Itching and Swelling    Eye Drops   Lisinopril Cough   Tetanus Toxoids Hives    Breaks out in bumps    Patient Measurements: Height: 5' (152.4 cm) Weight: 55.3 kg (122 lb) IBW/kg (Calculated) : 45.5 Heparin Dosing Weight: 55kg  Vital Signs: Temp: 98.1 F (36.7 C) (03/20 1032) Temp Source: Oral (03/20 1032) BP: 182/78 (03/20 1213) Pulse Rate: 90 (03/20 1213)  Labs: Recent Labs    12/08/22 1049  HGB 12.1  HCT 36.2  PLT 314  CREATININE 1.16*  TROPONINIHS 253*    Estimated Creatinine Clearance: 31.7 mL/min (A) (by C-G formula based on SCr of 1.16 mg/dL (H)).   Medical History: Past Medical History:  Diagnosis Date   Arthritis    Diabetes mellitus    since 2008   Essential hypertension, benign 09/10/2015   GERD (gastroesophageal reflux disease)    Hypercholesteremia    Hyperlipidemia    Stroke ([place]) 03/2011   only deficits-affected eyesight    Assessment: 77 year old female admitted to [facility] for nausea/vomiting for the past 3 days. Patient denies chest pain, troponin elevated at 253. Patient not on anticoagulants prior to admission. New orders to start heparin for possible acs.   Goal of Therapy:  Heparin level 0.3-0.7 units/ml Monitor platelets by anticoagulation protocol: Yes   Plan:  Give 3000 units bolus x  1 Start heparin infusion at 700 units/hr Check anti-Xa level in 8 hours and daily while on heparin Continue to monitor H&H and platelets   Erin Hearing PharmD., BCPS Clinical Pharmacist 12/08/2022 1:10 PM

## 2022-12-08 NOTE — ED Provider Notes (Addendum)
[facility] Provider Note   CSN: [id number] Arrival date & time: 12/08/22  1014     History  Chief Complaint  Patient presents with  . Emesis    Nancy Sutton is a 77 y.o. female.   Emesis    Patient presents the ED for evaluation of nausea vomiting.  Patient has history of diabetes hypercholesterolemia reflux stroke arthritis hyperlipidemia hypertension.  Patient states that she has not had any significant abdominal surgeries.  Notes indicate she has had esophageal dilatation procedures.  Patient states she went out to dinner with her husband.  They have been out to this restaurant before.  Later that evening she started having nausea and vomiting.  This started a couple days ago.  Ever since then she has continued to have nausea and vomiting.  She is not able to keep much down.  She denies having any pain but does admit to being a little bit sore from the vomiting..  She is not having any diarrhea.  Her husband only had a couple bites of what she ate but he has not had any issues.  She denies any chest pain.  No shortness of breath  Home Medications Prior to Admission medications   Medication Sig Start Date End Date Taking? Authorizing Provider  calcium carbonate (TUMS - DOSED IN MG ELEMENTAL CALCIUM) 500 MG chewable tablet Chew 1 tablet by mouth at bedtime as needed for indigestion or heartburn.   Yes [provider]  cholecalciferol (VITAMIN D3) 25 MCG (1000 UNIT) tablet Take 1,000 Units by mouth daily.   Yes [provider]  clopidogrel (PLAVIX) 75 MG tablet Take 1 tablet (75 mg total) by mouth daily. 05/01/17  Yes Rehman, Mechele Dawley, MD  Dulaglutide (TRULICITY) 1.5 0000000 SOPN Inject 1.5 mg into the skin once a week. 09/16/22  Yes Philemon Kingdom, MD  ezetimibe (ZETIA) 10 MG tablet Take 10 mg by mouth at bedtime.   Yes [provider]  Glucagon 3 MG/DOSE POWD Place 3 mg into the nose once as needed for up  to 1 dose. 05/27/22  Yes Philemon Kingdom, MD  insulin aspart (NOVOLOG FLEXPEN) 100 UNIT/ML FlexPen Inject 10-12 Units into the skin 3 (three) times daily with meals. Patient taking differently: Inject 8-12 Units into the skin 3 (three) times daily with meals. 05/27/22  Yes Philemon Kingdom, MD  insulin detemir (LEVEMIR) 100 UNIT/ML FlexPen Inject 27 Units into the skin at bedtime. 05/27/22  Yes Philemon Kingdom, MD  latanoprost (XALATAN) 0.005 % ophthalmic solution Place 1 drop into both eyes at bedtime. 03/22/17  Yes [provider]  metFORMIN (GLUCOPHAGE) 500 MG tablet Take 500 mg by mouth daily with breakfast.   Yes [provider]  Multiple Vitamin (ONE-A-DAY 55 PLUS PO) Take 1 tablet by mouth daily.    Yes [provider]  Multiple Vitamins-Minerals (HAIR SKIN AND NAILS FORMULA) TABS Take 1 tablet by mouth daily.   Yes [provider]  naproxen sodium (ALEVE) 220 MG tablet Take 220 mg by mouth daily as needed (arthritis pain).   Yes [provider]  Omega-3 Fatty Acids (FISH OIL) 1000 MG CAPS Take 1,000 mg by mouth daily.   Yes [provider]  omeprazole (PRILOSEC) 40 MG capsule TAKE ONE CAPSULE BY MOUTH DAILY 12/14/21  Yes Montez Morita, Quillian Quince, MD  simvastatin (ZOCOR) 80 MG tablet Take 80 mg by mouth at bedtime.   Yes [provider]  timolol (BETIMOL) 0.5 % ophthalmic  solution Place 1 drop into both eyes 2 (two) times daily.   Yes [provider]  vitamin C (ASCORBIC ACID) 500 MG tablet Take 500 mg by mouth daily.   Yes [provider]  vitamin E 180 MG (400 UNITS) capsule Take 400 Units by mouth daily.   Yes [provider]  Blood Glucose Monitoring Suppl (ONE TOUCH ULTRA 2) w/Device KIT : Use as instructed 4 x daily 11/05/20   Renato Shin, MD  GLOBAL EASE INJECT PEN NEEDLES 32G X 4 MM MISC USE 4 PENTIPS EVERY DAY 12/29/15   Cassandria Anger, MD  GLOBAL INJECT EASE LANCETS 30G MISC DIABETIC CLUB  12/14/16   Cassandria Anger, MD  [facility] ULTRA test strip test blood sugar FOUR TIMES DAILY 03/19/22   Elayne Snare, MD      Allergies    Ampicillin, Bromday [bromfenac sodium], Lisinopril, and Tetanus toxoids    Review of Systems   Review of Systems  Gastrointestinal:  Positive for vomiting.    Physical Exam Updated Vital Signs BP (!) 172/75 (BP Location: Left Arm)   Pulse 91   Temp 98.1 F (36.7 C) (Oral)   Resp 18   Ht 1.524 m (5')   Wt 55.3 kg   SpO2 100%   BMI 23.83 kg/m  Physical Exam Vitals and nursing note reviewed.  Constitutional:      Appearance: She is well-developed.  HENT:     Head: Normocephalic and atraumatic.     Right Ear: External ear normal.     Left Ear: External ear normal.  Eyes:     General: No scleral icterus.       Right eye: No discharge.        Left eye: No discharge.     Conjunctiva/sclera: Conjunctivae normal.  Neck:     Trachea: No tracheal deviation.  Cardiovascular:     Rate and Rhythm: Normal rate and regular rhythm.  Pulmonary:     Effort: Pulmonary effort is normal. No respiratory distress.     Breath sounds: Normal breath sounds. No stridor. No wheezing or rales.  Abdominal:     General: Bowel sounds are normal. There is no distension.     Palpations: Abdomen is soft.     Tenderness: There is no abdominal tenderness. There is no guarding or rebound.  Musculoskeletal:        General: No tenderness or deformity.     Cervical back: Neck supple.  Skin:    General: Skin is warm and dry.     Findings: No rash.  Neurological:     General: No focal deficit present.     Mental Status: She is alert.     Cranial Nerves: No cranial nerve deficit, dysarthria or facial asymmetry.     Sensory: No sensory deficit.     Motor: No abnormal muscle tone or seizure activity.     Coordination: Coordination normal.  Psychiatric:        Mood and Affect: Mood normal.     ED Results / Procedures / Treatments   Labs (all labs ordered are  listed, but only abnormal results are displayed) Labs Reviewed  COMPREHENSIVE METABOLIC PANEL - Abnormal; Notable for the following components:      Result Value   Sodium 134 (*)    Chloride 95 (*)    Glucose, Bld 453 (*)    BUN 31 (*)    Creatinine, Ser 1.16 (*)    Total Protein 8.5 (*)  GFR, Estimated 49 (*)    All other components within normal limits  CBC WITH DIFFERENTIAL/PLATELET - Abnormal; Notable for the following components:   Neutro Abs 8.0 (*)    All other components within normal limits  TROPONIN I (HIGH SENSITIVITY) - Abnormal; Notable for the following components:   Troponin I (High Sensitivity) 253 (*)    All other components within normal limits  LIPASE, BLOOD  URINALYSIS, ROUTINE W REFLEX MICROSCOPIC  HEPARIN LEVEL (UNFRACTIONATED)    EKG EKG Interpretation  Date/Time:  Wednesday December 08 2022 11:01:17 EDT Ventricular Rate:  85 PR Interval:  138 QRS Duration: 92 QT Interval:  392 QTC Calculation: 467 R Axis:   22 Text Interpretation: Sinus rhythm Probable LVH with secondary repol abnrm , new since last tracing Confirmed by Dorie Rank [phone]) on 12/08/2022 12:14:02 PM  Radiology No results found.  Procedures .Critical Care  Performed by: Dorie Rank, MD Authorized by: Dorie Rank, MD   Critical care provider statement:    Critical care time (minutes):  30   Critical care was time spent personally by me on the following activities:  Development of treatment plan with patient or surrogate, discussions with consultants, evaluation of patient's response to treatment, examination of patient, ordering and review of laboratory studies, ordering and review of radiographic studies, ordering and performing treatments and interventions, pulse oximetry, re-evaluation of patient's condition and review of old charts     Medications Ordered in ED Medications  sodium chloride 0.9 % bolus 1,000 mL (0 mLs Intravenous Stopped 12/08/22 1205)    And  0.9 %  sodium  chloride infusion ( Intravenous New Bag/Given 12/08/22 1100)  nitroGLYCERIN (NITROGLYN) 2 % ointment 1 inch (1 inch Topical Given 12/08/22 1329)  heparin ADULT infusion 100 units/mL (25000 units/296mL) (700 Units/hr Intravenous New Bag/Given 12/08/22 1325)  ondansetron (ZOFRAN) injection 4 mg (4 mg Intravenous Given 12/08/22 1100)  ondansetron (ZOFRAN) injection 4 mg (4 mg Intravenous Given 12/08/22 1217)  heparin bolus via infusion 3,000 Units (3,000 Units Intravenous Bolus from Bag 12/08/22 1325)  insulin aspart (novoLOG) injection 8 Units (8 Units Subcutaneous Given 12/08/22 1327)    ED Course/ Medical Decision Making/ A&P Clinical Course as of 12/08/22 1607  Wed Dec 08, 2022  1130 CBC with Diff(!) Normal [JK]  1130 Comprehensive metabolic panel(!) Hyperglycemia noted.  No acidosis.  BUN/creatinine elevated [JK]  1303 Patient's troponin elevated at 253.  Patient's nausea concerning for the possibility of anginal equivalent [JK]  1313 Case discussed with Cardiology.  Will come to see in the ED [JK]  1534 Second troponin has increased to 823.  Presentation concerning for NSTEMI.  Cardiology has evaluated the patient but with her hyperglycemia they recommend admission to the medical service at [facility].  Cardiology will be following along [JK]  1607 Case discussed with Dr Denton Brick regarding admission [JK]    Clinical Course User Index [JK] Dorie Rank, MD                             Medical Decision Making Problems Addressed: Hyperglycemia: acute illness or injury that poses a threat to life or bodily functions NSTEMI (non-ST elevated myocardial infarction) [facility]): acute illness or injury that poses a threat to life or bodily functions Vomiting without nausea, unspecified vomiting type: acute illness or injury that poses a threat to life or bodily functions  Amount and/or Complexity of Data Reviewed Labs: ordered. Decision-making details documented in ED Course. Radiology:  ordered.  Risk Prescription drug management. Decision regarding hospitalization.   Presented to the ED for evaluation of persistent nausea and vomiting.  Patient has not been taking her insulin because she has not been eating much.  Patient is not having any abdominal pain.  She has not noticed any abdominal bloating.  Low suspicion for obstruction colitis.  Laboratory test do not show evidence of pancreatitis or hepatitis.  Did note EKG abnormalities on the cardiac monitor and an EKG was performed.  EKG showed repolarization changes possible from LVH however these are new compared to prior findings.  Troponin was added on and it does show elevations up to 253.  Patient does have evidence of mild AKI and hyperglycemia but no signs of DKA or acidosis at this time.  I have consulted with cardiology.  Patient will require admission to the hospital.  Cardiology will evaluate to determine whether she is appropriate for admission here versus transfer to [facility].        Final Clinical Impression(s) / ED Diagnoses Final diagnoses:  Vomiting without nausea, unspecified vomiting type  NSTEMI (non-ST elevated myocardial infarction) ([place])  Hyperglycemia    Rx / DC Orders ED Discharge Orders     None         Dorie Rank, MD 12/08/22 1337 CT scan findings reviewed.  Patient has incidental gallstones and hiatal hernia but no acute findings to account for her persistent nausea.   Dorie Rank, MD 12/08/22 Marella Bile    Dorie Rank, MD 12/08/22 [phone]

## 2022-12-08 NOTE — ED Notes (Signed)
Attempted to call report to 3E, no answer 

## 2022-12-08 NOTE — Consult Note (Addendum)
Cardiology Consult:   Patient ID: Nancy Sutton; MRN: [id number]; DOB: 12-12-1945   Admission date: 12/08/2022  Primary Care Provider: Redmond School, MD Primary Cardiologist: New to [place] (Live in [place])  Chief Complaint:  NSTEMI  Patient Profile:   Nancy Sutton is a 77 y.o. female with a history of Mild CAS; T2DM prior stroke in 2012 with heart monitor negative for AF. Who is presenting for nausea and vomiting complicated by new troponin elevation 253  History of Present Illness:   Nancy Sutton has been feeling fine prior to Monday. She was able to do all ADLs.  Blood glucose well controlled.  She sees Dr. Gerarda Fraction   and yearly gets a Duplex and echocardiogram done outside of the [place] (not available in Care Everywhere).  She takes plavix for prevention and is otherwise well Was last feeling well Monday morning.  She notes going out to eat and thinks she got food poisoning.  Since Monday she has not been able to keep any food down.  She have been vomiting constantly.  She has not taken any insulin because she cannot take any food down.Marland Kitchen  Has had no chest pain, chest pressure, chest tightness, chest stinging .  No shortness of breath, DOE .  No PND or orthopnea.  No weight gain, leg swelling , or abdominal swelling.  No syncope or near syncope . Notes  no palpitations or funny heart beats.   Persistent symptoms lead to ED evaluation.  Nothing she has been given for nausea has helped.  As part of her evaluation, she has new LVH and global ST depression; first hs troponin was 256.  She was placed on heparin.  Given 1 inch of nitro paste which has made no difference in symptoms.  Glucose on eval was 435; AG 14, full DKA eval is pending.  Cardiology called for evaluation  Past Medical History:  Diagnosis Date   Arthritis    Diabetes mellitus    since 2008   Essential hypertension, benign 09/10/2015   GERD (gastroesophageal reflux disease)    Hypercholesteremia     Hyperlipidemia    Stroke ([place]) 03/2011   only deficits-affected eyesight    Past Surgical History:  Procedure Laterality Date   BIOPSY  07/02/2022   Procedure: BIOPSY;  Surgeon: Harvel Quale, MD;  Location: [facility];  Service: Gastroenterology;;   CATARACT EXTRACTION W/PHACO  11/01/2011   Procedure: CATARACT EXTRACTION PHACO AND INTRAOCULAR LENS PLACEMENT ([place]);  Surgeon: Tonny Branch, MD;  Location: [facility] ORS;  Service: Ophthalmology;  Laterality: Right;  CDE 12.48   ESOPHAGEAL DILATION N/A 04/29/2017   Procedure: ESOPHAGEAL DILATION;  Surgeon: Rogene Houston, MD;  Location: [facility];  Service: Endoscopy;  Laterality: N/A;   ESOPHAGOGASTRODUODENOSCOPY N/A 04/29/2017   Procedure: ESOPHAGOGASTRODUODENOSCOPY (EGD);  Surgeon: Rogene Houston, MD;  Location: [facility];  Service: Endoscopy;  Laterality: N/A;  1015   ESOPHAGOGASTRODUODENOSCOPY (EGD) WITH ESOPHAGEAL DILATION N/A 02/14/2013   Procedure: ESOPHAGOGASTRODUODENOSCOPY (EGD) WITH ESOPHAGEAL DILATION;  Surgeon: Rogene Houston, MD;  Location: [facility];  Service: Endoscopy;  Laterality: N/A;  200-moved to 255 Ann to notify pt   ESOPHAGOGASTRODUODENOSCOPY (EGD) WITH PROPOFOL N/A 05/28/2022   Procedure: ESOPHAGOGASTRODUODENOSCOPY (EGD) WITH PROPOFOL;  Surgeon: Harvel Quale, MD;  [facility];  Service: Gastroenterology;  Laterality: N/A;  1030 ASA 2   ESOPHAGOGASTRODUODENOSCOPY (EGD) WITH PROPOFOL N/A 07/02/2022   Procedure: ESOPHAGOGASTRODUODENOSCOPY (EGD) WITH PROPOFOL;  Surgeon: Harvel Quale, MD;  Location: [facility];  Service: Gastroenterology;  Laterality: N/A;  105 ASA 2, pt knows new time per [place]  10-2010   left eye removal of cataract   TUBAL LIGATION  1984     Medications Prior to Admission: Prior to Admission medications   Medication Sig Start Date End Date Taking? Authorizing Provider  calcium carbonate (TUMS - DOSED IN MG ELEMENTAL CALCIUM) 500  MG chewable tablet Chew 1 tablet by mouth at bedtime as needed for indigestion or heartburn.   Yes [provider]  cholecalciferol (VITAMIN D3) 25 MCG (1000 UNIT) tablet Take 1,000 Units by mouth daily.   Yes [provider]  clopidogrel (PLAVIX) 75 MG tablet Take 1 tablet (75 mg total) by mouth daily. 05/01/17  Yes Rehman, Mechele Dawley, MD  Dulaglutide (TRULICITY) 1.5 0000000 SOPN Inject 1.5 mg into the skin once a week. 09/16/22  Yes Philemon Kingdom, MD  ezetimibe (ZETIA) 10 MG tablet Take 10 mg by mouth at bedtime.   Yes [provider]  Glucagon 3 MG/DOSE POWD Place 3 mg into the nose once as needed for up to 1 dose. 05/27/22  Yes Philemon Kingdom, MD  insulin aspart (NOVOLOG FLEXPEN) 100 UNIT/ML FlexPen Inject 10-12 Units into the skin 3 (three) times daily with meals. Patient taking differently: Inject 8-12 Units into the skin 3 (three) times daily with meals. 05/27/22  Yes Philemon Kingdom, MD  insulin detemir (LEVEMIR) 100 UNIT/ML FlexPen Inject 27 Units into the skin at bedtime. 05/27/22  Yes Philemon Kingdom, MD  latanoprost (XALATAN) 0.005 % ophthalmic solution Place 1 drop into both eyes at bedtime. 03/22/17  Yes [provider]  metFORMIN (GLUCOPHAGE) 500 MG tablet Take 500 mg by mouth daily with breakfast.   Yes [provider]  Multiple Vitamin (ONE-A-DAY 55 PLUS PO) Take 1 tablet by mouth daily.    Yes [provider]  Multiple Vitamins-Minerals (HAIR SKIN AND NAILS FORMULA) TABS Take 1 tablet by mouth daily.   Yes [provider]  naproxen sodium (ALEVE) 220 MG tablet Take 220 mg by mouth daily as needed (arthritis pain).   Yes [provider]  Omega-3 Fatty Acids (FISH OIL) 1000 MG CAPS Take 1,000 mg by mouth daily.   Yes [provider]  omeprazole (PRILOSEC) 40 MG capsule TAKE ONE CAPSULE BY MOUTH DAILY 12/14/21  Yes Montez Morita, Quillian Quince, MD  simvastatin (ZOCOR) 80 MG tablet Take 80 mg by mouth at  bedtime.   Yes [provider]  timolol (BETIMOL) 0.5 % ophthalmic solution Place 1 drop into both eyes 2 (two) times daily.   Yes [provider]  vitamin C (ASCORBIC ACID) 500 MG tablet Take 500 mg by mouth daily.   Yes [provider]  vitamin E 180 MG (400 UNITS) capsule Take 400 Units by mouth daily.   Yes [provider]  Blood Glucose Monitoring Suppl (ONE TOUCH ULTRA 2) w/Device KIT : Use as instructed 4 x daily 11/05/20   Renato Shin, MD  GLOBAL EASE INJECT PEN NEEDLES 32G X 4 MM MISC USE 4 PENTIPS EVERY DAY 12/29/15   Cassandria Anger, MD  GLOBAL INJECT EASE LANCETS 30G MISC DIABETIC CLUB 12/14/16   Cassandria Anger, MD  [facility] ULTRA test strip test blood sugar FOUR TIMES DAILY 03/19/22   Elayne Snare, MD     Allergies:    Allergies  Allergen Reactions   Ampicillin Hives    Breaks out in bumps Has patient  had a PCN reaction causing immediate rash, facial/tongue/throat swelling, SOB or lightheadedness with hypotension: No Has patient had a PCN reaction causing severe rash involving mucus membranes or skin necrosis: Yes Has patient had a PCN reaction that required hospitalization: No Has patient had a PCN reaction occurring within the last 10 years: No If all of the above answers are "NO", then may proceed with Cephalosporin use.    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   Patient is right handed.   Patient has hs education.   Patient drinks 3-5 glasses of tea daily.   Social Determinants of Health   Financial Resource Strain: Not on file  Food Insecurity: Not on file  Transportation Needs: Not on file  Physical Activity: Not on file  Stress: Not on file  Social Connections: Not on file  Intimate Partner Violence: Not on file    Family History:   The patient's family history includes Cancer in her father and mother; Diabetes in her father and mother. There is no history of Anesthesia problems, Hypotension, Malignant hyperthermia, or Pseudochol deficiency.    ROS:  Please see the history of present illness.  All other ROS reviewed and negative.     Physical Exam/Data:   Vitals:   12/08/22 1213 12/08/22 1332 12/08/22 1428 12/08/22 1444  BP: (!) 182/78 (!) 172/75 (!) 161/74   Pulse: 90 91 88   Resp: 17 18 18    Temp:    98.3 F (36.8 C)  TempSrc:    Oral  SpO2: 100% 100% 100%   Weight:      Height:        Intake/Output Summary (Last 24 hours) at 12/08/2022 1458 Last data filed at 12/08/2022 1205 Gross per 24 hour  Intake 999 ml  Output --  Net 999 ml   Filed Weights   12/08/22 1033  Weight: 55.3 kg   Body mass index is 23.83 kg/m.   Gen: Mild distress   Neck: No JVD Cardiac: No Rubs or Gallops, no murmur, RRR +2 radial pulses Respiratory: Clear to auscultation bilaterally, normal effort, normal  respiratory rate GI: Soft, nontender, non-distended  MS: No  edema;  moves all extremities Integument: Skin feels warm Neuro:  At time of evaluation, alert and oriented to person/place/time/situation  Psych: Normal affect, patient feels ok   EKG:  The ECG that was done  was personally reviewed and demonstrates  12/08/22: SR with LVH  And inferolateral ST depressions new from 2014  TELE: SR with rare PVCs  Laboratory Data:  Chemistry Recent Labs  Lab  12/08/22 1049  NA 134*  K 3.5  CL 95*  CO2 25  GLUCOSE 453*  BUN 31*  CREATININE 1.16*  CALCIUM 10.1  GFRNONAA 49*  ANIONGAP 14    Recent Labs  Lab 12/08/22 1049  PROT 8.5*  ALBUMIN 4.4  AST 34  ALT 21  ALKPHOS 63  BILITOT 1.1   Hematology Recent Labs  Lab 12/08/22 1049  WBC 9.5  RBC 4.13  HGB 12.1  HCT 36.2  MCV 87.7  MCH 29.3  MCHC 33.4  RDW 13.2  PLT 314   Cardiac EnzymesNo results for input(s): "TROPONINI" in the last 168 hours. No results for input(s): "TROPIPOC" in the last 168 hours.  BNPNo results for input(s): "BNP", "PROBNP" in the last 168 hours.  DDimer No results for input(s): "DDIMER" in the last 168 hours.  Radiology/Studies:  No results found.  Assessment and Plan:    NSTEMI - complicated by DKA vs HONK - in the setting of known vascular disease(prior stroke and CAS) - Plan for ASA and heparin, transition to high intensity statin when able to take PO - given IV hydralazine for HTN component - discussed with son, daugther, husband and patient: she is asymptomatic and this may be demand from her hyperglycemic crisis: if her troponin is stable and she is hemodynamically stable and still asymptomatic, will plan for heparin, ASA, and echo; will defer ischemic eval at this time -if any chances to the above, will plan to send to [facility] for eval and potential cath - etiher way needs evaluation to rule out DKA and to get her nausea, vomiting, and hyperglycemia controlled; we are happy to consult    For questions or updates, please contact [place] Please consult [email or web address] for contact info under Cardiology/STEMI.   Rudean Haskell, MD [address] [place], [place] [address] [phone]  2:58 PM  Second troponin is 823. Still needs control of her  BG, but given her risk factors and progression of troponin recommend her eval be at [facility] as she may need LHC.  Rudean Haskell, MD [facility] [address] [place],  [id number] [phone]  3:32 PM

## 2022-12-08 NOTE — Assessment & Plan Note (Signed)
CVA with residual left hemianopsia. -Resume Plavix, switch simvastatin to high intensity statin-atorvastatin,

## 2022-12-08 NOTE — Assessment & Plan Note (Addendum)
Presenting with nausea and vomiting, no chest pain or dyspnea.  2nd episode in the past week.  Possibly anginal equivalent.  EKG with T wave changes to lead 2, 3, aVF, subsequent troponin - elevated at 253 > 823.  No cardiac history.  Hx of Stroke.  Never smoked cigarettes. -Cardiology consulted, recommended admission to [facility] for NSTEMI for evaluation and potential cath, heparin drip, aspirin, high intensity statin, IV hydralazine as needed for hypertension, echocardiogram. -Nitro as needed -Aspirin not ordered due to allergy -N.p.o. midnight -No improvement in nausea and vomiting after Zofran, Phenergan -As needed metoclopramide -1 Liter bolus given, continue N/s + 40 KCl 100cc/hr x 15hrs

## 2022-12-08 NOTE — Assessment & Plan Note (Deleted)
Blood glucose initially 453 >> 254, s/p 8 units of NovoLog and 1 L bolus.  Hemoglobin A1c 7.2.  She has not taken her insulin in the past couple of days due to poor oral intake, vomiting. - With poor oral intake, hold Levemir 25 units nightly, hold metformin - SSI- M q4h -Continue hydration

## 2022-12-08 NOTE — Assessment & Plan Note (Signed)
Elevated, systolic up to [id number]. -As needed hydralazine

## 2022-12-08 NOTE — ED Triage Notes (Signed)
Pt states she has been vomiting x 3 days.  States she can't keep anything down.  Reports not taking insulin the past 2 days due to not eating.  Denies any other symptoms at time of triage.

## 2022-12-08 NOTE — ED Notes (Signed)
[facility] called to transport patient. Nurse aware.

## 2022-12-08 NOTE — H&P (Addendum)
History and Physical    Nancy Sutton [id number] DOB: 11-18-1945 DOA: 12/08/2022  PCP: Redmond School, MD   Patient coming from: Home  I have personally briefly reviewed patient's old medical records in [place]  Chief Complaint: Nausea and Vomiting  HPI: Nancy Sutton is a 77 y.o. female with medical history significant for CVA with- residual left hemianopsia, hypertension, diabetes mellitus. Patient presented to the ED with complaints of nausea and vomiting that started 2 days ago, poor oral intake, has not been able to eat anything.  2 days ago, she ate at the restaurant, later that night symptoms started.  She ate a variety of meals, husband took just a bite of her meal.  No diarrhea.  She reports about a week ago she had similar episodes of vomiting, she ate at another restaurant, chicken dumplings,, but those symptoms resolved by the next day. Denies chest pain, no palpitation, no difficulty breathing, no pain radiating down her arms, no dizziness.  No prior cardiac history.  Never smoked cigarettes.  No family history of premature coronary artery disease she has had a stroke for which she is on Plavix.  ED Course: Blood pressure 160s to 170s mostly, heart rate 80s to 90s.  O2 sat 100% on room air.  Temperature 98.1.  CT abdomen and pelvis without acute abnormality, shows multiple gallstones and gallbladder.  EKG showed T wave changes, so troponin was obtained and was elevated at 253 >> 823. Cardiology was consulted, recommended admission to [facility] for NSTEMI, start heparin drip  Review of Systems: As per HPI all other systems reviewed and negative.  Past Medical History:  Diagnosis Date   Arthritis    Diabetes mellitus    since 2008   Essential hypertension, benign 09/10/2015   GERD (gastroesophageal reflux disease)    Hypercholesteremia    Hyperlipidemia    Stroke ([place]) 03/2011   only deficits-affected eyesight    Past Surgical History:  Procedure  Laterality Date   BIOPSY  07/02/2022   Procedure: BIOPSY;  Surgeon: Harvel Quale, MD;  [facility]: [facility];  Service: Gastroenterology;;   CATARACT EXTRACTION W/PHACO  11/01/2011   Procedure: CATARACT EXTRACTION PHACO AND INTRAOCULAR LENS PLACEMENT ([place]);  Surgeon: Tonny Branch, MD;  Location: [facility];  Service: Ophthalmology;  Laterality: Right;  CDE 12.48   ESOPHAGEAL DILATION N/A 04/29/2017   Procedure: ESOPHAGEAL DILATION;  Surgeon: Rogene Houston, MD;  Location: [facility];  Service: Endoscopy;  Laterality: N/A;   ESOPHAGOGASTRODUODENOSCOPY N/A 04/29/2017   Procedure: ESOPHAGOGASTRODUODENOSCOPY (EGD);  Surgeon: Rogene Houston, MD;  [facility];  Service: Endoscopy;  Laterality: N/A;  1015   ESOPHAGOGASTRODUODENOSCOPY (EGD) WITH ESOPHAGEAL DILATION N/A 02/14/2013   Procedure: ESOPHAGOGASTRODUODENOSCOPY (EGD) WITH ESOPHAGEAL DILATION;  Surgeon: Rogene Houston, MD;  [facility];  Service: Endoscopy;  Laterality: N/A;  200-moved to 255 Ann to notify pt   ESOPHAGOGASTRODUODENOSCOPY (EGD) WITH PROPOFOL N/A 05/28/2022   Procedure: ESOPHAGOGASTRODUODENOSCOPY (EGD) WITH PROPOFOL;  Surgeon: Harvel Quale, MD;  [facility];  Service: Gastroenterology;  Laterality: N/A;  1030 ASA 2   ESOPHAGOGASTRODUODENOSCOPY (EGD) WITH PROPOFOL N/A 07/02/2022   Procedure: ESOPHAGOGASTRODUODENOSCOPY (EGD) WITH PROPOFOL;  Surgeon: Harvel Quale, MD;  [facility];  Service: Gastroenterology;  Laterality: N/A;  105 ASA 2, pt knows new time per [place]  10-2010   left eye removal of cataract   TUBAL LIGATION  1984  reports that she has never smoked. She has never used smokeless tobacco. She reports that she does not drink alcohol and does not use drugs.  Allergies  Allergen Reactions   Ampicillin Hives    Breaks out in bumps Has patient had a PCN reaction causing immediate rash, facial/tongue/throat swelling, SOB  or lightheadedness with hypotension: No Has patient had a PCN reaction causing severe rash involving mucus membranes or skin necrosis: Yes Has patient had a PCN reaction that required hospitalization: No Has patient had a PCN reaction occurring within the last 10 years: No If all of the above answers are "NO", then may proceed with Cephalosporin use.    Bromday [Bromfenac Sodium] Itching and Swelling    Eye Drops   Lisinopril Cough   Tetanus Toxoids Hives    Breaks out in bumps    Family History  Problem Relation Age of Onset   Cancer Mother    Diabetes Mother    Cancer Father    Diabetes Father    Anesthesia problems Neg Hx    Hypotension Neg Hx    Malignant hyperthermia Neg Hx    Pseudochol deficiency Neg Hx     Prior to Admission medications   Medication Sig Start Date End Date Taking? Authorizing Provider  calcium carbonate (TUMS - DOSED IN MG ELEMENTAL CALCIUM) 500 MG chewable tablet Chew 1 tablet by mouth at bedtime as needed for indigestion or heartburn.   Yes [provider]  cholecalciferol (VITAMIN D3) 25 MCG (1000 UNIT) tablet Take 1,000 Units by mouth daily.   Yes [provider]  clopidogrel (PLAVIX) 75 MG tablet Take 1 tablet (75 mg total) by mouth daily. 05/01/17  Yes Rehman, Mechele Dawley, MD  Dulaglutide (TRULICITY) 1.5 0000000 SOPN Inject 1.5 mg into the skin once a week. 09/16/22  Yes Philemon Kingdom, MD  ezetimibe (ZETIA) 10 MG tablet Take 10 mg by mouth at bedtime.   Yes [provider]  Glucagon 3 MG/DOSE POWD Place 3 mg into the nose once as needed for up to 1 dose. 05/27/22  Yes Philemon Kingdom, MD  insulin aspart (NOVOLOG FLEXPEN) 100 UNIT/ML FlexPen Inject 10-12 Units into the skin 3 (three) times daily with meals. Patient taking differently: Inject 8-12 Units into the skin 3 (three) times daily with meals. 05/27/22  Yes Philemon Kingdom, MD  insulin detemir (LEVEMIR) 100 UNIT/ML FlexPen Inject 27 Units into the skin at bedtime.  05/27/22  Yes Philemon Kingdom, MD  latanoprost (XALATAN) 0.005 % ophthalmic solution Place 1 drop into both eyes at bedtime. 03/22/17  Yes [provider]  metFORMIN (GLUCOPHAGE) 500 MG tablet Take 500 mg by mouth daily with breakfast.   Yes [provider]  Multiple Vitamin (ONE-A-DAY 55 PLUS PO) Take 1 tablet by mouth daily.    Yes [provider]  Multiple Vitamins-Minerals (HAIR SKIN AND NAILS FORMULA) TABS Take 1 tablet by mouth daily.   Yes [provider]  naproxen sodium (ALEVE) 220 MG tablet Take 220 mg by mouth daily as needed (arthritis pain).   Yes [provider]  Omega-3 Fatty Acids (FISH OIL) 1000 MG CAPS Take 1,000 mg by mouth daily.   Yes [provider]  omeprazole (PRILOSEC) 40 MG capsule TAKE ONE CAPSULE BY MOUTH DAILY 12/14/21  Yes Montez Morita, Quillian Quince, MD  simvastatin (ZOCOR) 80 MG tablet Take 80 mg by mouth at bedtime.   Yes [provider]  timolol (BETIMOL) 0.5 % ophthalmic solution Place 1 drop into both eyes 2 (  two) times daily.   Yes [provider]  vitamin C (ASCORBIC ACID) 500 MG tablet Take 500 mg by mouth daily.   Yes [provider]  vitamin E 180 MG (400 UNITS) capsule Take 400 Units by mouth daily.   Yes [provider]  Blood Glucose Monitoring Suppl (ONE TOUCH ULTRA 2) w/Device KIT : Use as instructed 4 x daily 11/05/20   Renato Shin, MD  GLOBAL EASE INJECT PEN NEEDLES 32G X 4 MM MISC USE 4 PENTIPS EVERY DAY 12/29/15   Cassandria Anger, MD  GLOBAL INJECT EASE LANCETS 30G MISC DIABETIC CLUB 12/14/16   Cassandria Anger, MD  [facility] ULTRA test strip test blood sugar FOUR TIMES DAILY 03/19/22   Elayne Snare, MD    Physical Exam: Vitals:   12/08/22 1213 12/08/22 1332 12/08/22 1428 12/08/22 1444  BP: (!) 182/78 (!) 172/75 (!) 161/74   Pulse: 90 91 88   Resp: 17 18 18    Temp:    98.3 F (36.8 C)  TempSrc:    Oral  SpO2: 100% 100% 100%   Weight:      Height:         Constitutional: NAD, calm, comfortable Vitals:   12/08/22 1213 12/08/22 1332 12/08/22 1428 12/08/22 1444  BP: (!) 182/78 (!) 172/75 (!) 161/74   Pulse: 90 91 88   Resp: 17 18 18    Temp:    98.3 F (36.8 C)  TempSrc:    Oral  SpO2: 100% 100% 100%   Weight:      Height:       Eyes: Pupils Equal, lids and conjunctivae normal ENMT: Mucous membranes are moist.   Neck: normal, supple, no masses, no thyromegaly Respiratory: clear to auscultation bilaterally, no wheezing, no crackles. Normal respiratory effort. No accessory muscle use.  Cardiovascular: Regular rate and rhythm, no murmurs / rubs / gallops. No extremity edema.  Extremities warm Abdomen: no tenderness, no masses palpated. No hepatosplenomegaly. Bowel sounds positive.  Musculoskeletal: no clubbing / cyanosis. No joint deformity upper and lower extremities. Good ROM, no contractures. Normal muscle tone.  Skin: no rashes, lesions, ulcers. No induration Neurologic: no facial asymmetry, speech fluent without aphasia, and extremity spontaneously. Psychiatric: Normal judgment and insight. Alert and oriented x 3. Normal mood.   Labs on Admission: I have personally reviewed following labs and imaging studies  CBC: Recent Labs  Lab 12/08/22 1049  WBC 9.5  NEUTROABS 8.0*  HGB 12.1  HCT 36.2  MCV 87.7  PLT [id number]   Basic Metabolic Panel: Recent Labs  Lab 12/08/22 1049  NA 134*  K 3.5  CL 95*  CO2 25  GLUCOSE 453*  BUN 31*  CREATININE 1.16*  CALCIUM 10.1   GFR: Estimated Creatinine Clearance: 31.7 mL/min (A) (by C-G formula based on SCr of 1.16 mg/dL (H)). Liver Function Tests: Recent Labs  Lab 12/08/22 1049  AST 34  ALT 21  ALKPHOS 63  BILITOT 1.1  PROT 8.5*  ALBUMIN 4.4   Recent Labs  Lab 12/08/22 1049  LIPASE 27    Radiological Exams on Admission: CT ABDOMEN PELVIS W CONTRAST  Result Date: 12/08/2022 CLINICAL DATA:  Vomiting over the last 3 days. Bowel obstruction suspected. EXAM: CT ABDOMEN  AND PELVIS WITH CONTRAST TECHNIQUE: Multidetector CT imaging of the abdomen and pelvis was performed using the standard protocol following bolus administration of intravenous contrast. RADIATION DOSE REDUCTION: This exam was performed according to the departmental dose-optimization program which includes automated exposure control, adjustment of  the mA and/or kV according to patient size and/or use of iterative reconstruction technique. CONTRAST:  119mL OMNIPAQUE IOHEXOL 300 MG/ML  SOLN COMPARISON:  None Available. FINDINGS: Lower chest: Mild scarring at the lung bases. No active process. Small hiatal hernia. Hepatobiliary: Few small calcified granulomas. Scattered small cysts. No worrisome liver lesion. The gallbladder contains multiple small stones, 1-4 mm in size, the size that would be at high risk of passing into the ductal system. However, there is no ductal dilatation or visible ductal stone. No CT evidence of cholecystitis. If concern persists, consider ultrasound or nuclear medicine scan. Pancreas: Normal Spleen: Normal Adrenals/Urinary Tract: Adrenal glands are normal. Kidneys are normal. No cyst, mass, stone or hydronephrosis. Bladder is full but normal. Stomach/Bowel: Small hiatal hernia as noted above. Stomach is otherwise normal. The small bowel is normal. No obstruction or ileus. Normal appendix. Normal colon. Vascular/Lymphatic: Aortic atherosclerosis. No aneurysm. IVC is normal. No adenopathy. Reproductive: No pelvic mass.  Few small leiomyomas of the uterus. Other: No free fluid or air. Musculoskeletal: Ordinary spinal degenerative changes. IMPRESSION: 1. No acute finding to explain the clinical presentation. No evidence of bowel obstruction or ileus. 2. Small hiatal hernia. 3. Multiple small stones in the gallbladder, 1-4 mm in size, the size that would be at high risk of passing into the ductal system. However, there is no ductal dilatation or visible ductal stone. No CT evidence of  cholecystitis. If concern persists, consider ultrasound or nuclear medicine scan. 4. Aortic atherosclerosis. 5. Few small leiomyomas of the uterus. Aortic Atherosclerosis (ICD10-I70.0). Electronically Signed   By: Nelson Chimes M.D.   On: 12/08/2022 15:40    EKG: Independently reviewed.  Sinus rhythm, rate 85, QTc 467, T wave changes to lead to 3 aVF.  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  2nd episode in the past week.  Possibly anginal equivalent.  EKG with T wave changes to lead 2, 3, aVF, subsequent troponin - elevated at 253 > 823.  No cardiac history.  Hx of Stroke.  Never smoked cigarettes. -Cardiology consulted, recommended admission to [facility] for NSTEMI for evaluation and potential cath, heparin drip, aspirin, high intensity statin, IV hydralazine as needed for hypertension, echocardiogram. -Nitro as needed -Aspirin not ordered due to allergy -N.p.o. midnight -No improvement in nausea and vomiting after Zofran, Phenergan -As needed metoclopramide -1 Liter bolus given, continue N/s + 40 KCl 100cc/hr x 15hrs   Diabetes mellitus with hyperglycemia ([place]) Blood glucose initially 453 >> 254, s/p 8 units of NovoLog and 1 L bolus.  Not in DKA, anion gap 14, serum bicarb 25.  Hemoglobin A1c 7.2.  She has not taken her insulin in the past couple of days due to poor oral intake, vomiting.  Diagnosed with LADA.  Follows with endocrinology. - Continue Levemir at reduced dose- 10u QHS, ( Home dose - 25 units nightly), hold metformin - SSI- S q4h -Continue hydration  Essential hypertension, benign Elevated, systolic up to [id number]. -As  needed hydralazine   Cerebral infarction [facility]) CVA with residual left hemianopsia. -Resume Plavix, switch simvastatin to high intensity statin-atorvastatin,    DVT prophylaxis: Heparin Code Status: FULL code-plan with patient, spouse and daughter at bedside Family  Communication: Spouse and daughter at bedside Disposition Plan: ~ 2 days Consults called: Cardiology Admission status: Obs, Tele    Author: Bethena Roys, MD 12/08/2022 7:07 PM  For on call review [email or web address].

## 2022-12-08 NOTE — Assessment & Plan Note (Addendum)
Blood glucose initially 453 >> 254, s/p 8 units of NovoLog and 1 L bolus.  Not in DKA, anion gap 14, serum bicarb 25.  Hemoglobin A1c 7.2.  She has not taken her insulin in the past couple of days due to poor oral intake, vomiting.  Diagnosed with LADA.  Follows with endocrinology. - Continue Levemir at reduced dose- 10u QHS, ( Home dose - 25 units nightly), hold metformin - SSI- S q4h -Continue hydration

## 2022-12-08 NOTE — Progress Notes (Signed)
ANTICOAGULATION CONSULT NOTE - Follow Up Consult  Pharmacy Consult for Heparin Indication: chest pain/ACS  Allergies  Allergen Reactions   Ampicillin Hives    Breaks out in bumps Has patient had a PCN reaction causing immediate rash, facial/tongue/throat swelling, SOB or lightheadedness with hypotension: No Has patient had a PCN reaction causing severe rash involving mucus membranes or skin necrosis: Yes Has patient had a PCN reaction that required hospitalization: No Has patient had a PCN reaction occurring within the last 10 years: No If all of the above answers are "NO", then may proceed with Cephalosporin use.    Bromday [Bromfenac Sodium] Itching and Swelling    Eye Drops   Lisinopril Cough   Tetanus Toxoids Hives    Breaks out in bumps    Patient Measurements: Height: 5' (152.4 cm) Weight: 55.3 kg (122 lb) IBW/kg (Calculated) : 45.5 Heparin Dosing Weight: 55 kg  Vital Signs: Temp: 99.1 F (37.3 C) (03/20 2000) Temp Source: Oral (03/20 2000) BP: 144/60 (03/20 2000) Pulse Rate: 98 (03/20 2000)  Labs: Recent Labs    12/08/22 1049 12/08/22 1452 12/08/22 1942  HGB 12.1  --   --   HCT 36.2  --   --   PLT 314  --   --   HEPARINUNFRC  --   --  0.47  CREATININE 1.16*  --   --   TROPONINIHS 253* 823* 740*    Estimated Creatinine Clearance: 31.7 mL/min (A) (by C-G formula based on SCr of 1.16 mg/dL (H)).  Assessment: 77 year old female admitted to [facility] for nausea/vomiting for the past 3 days. Patient denies chest pain, troponin elevated at 253. Patient not on anticoagulants prior to admission. Heparin begun for possible ACS.     Troponins [id number]. Transferred to [facility] this evening, as may need LHC.  Initial heparin level is therapeutic (0.47) on 700 units/hr.  Goal of Therapy:  Heparin level 0.3-0.7 units/ml Monitor platelets by anticoagulation protocol: Yes   Plan:  Continue heparin drip at 700 units/hr Daily heparin level and CBC while on  heparin.  Arty Baumgartner, RPh 12/08/2022,9:48 PM

## 2022-12-09 ENCOUNTER — Observation Stay (HOSPITAL_COMMUNITY): Payer: PPO

## 2022-12-09 DIAGNOSIS — Z79899 Other long term (current) drug therapy: Secondary | ICD-10-CM | POA: Diagnosis not present

## 2022-12-09 DIAGNOSIS — I2510 Atherosclerotic heart disease of native coronary artery without angina pectoris: Secondary | ICD-10-CM | POA: Diagnosis not present

## 2022-12-09 DIAGNOSIS — R739 Hyperglycemia, unspecified: Secondary | ICD-10-CM | POA: Diagnosis present

## 2022-12-09 DIAGNOSIS — H53462 Homonymous bilateral field defects, left side: Secondary | ICD-10-CM | POA: Diagnosis present

## 2022-12-09 DIAGNOSIS — I4891 Unspecified atrial fibrillation: Secondary | ICD-10-CM | POA: Diagnosis not present

## 2022-12-09 DIAGNOSIS — Z880 Allergy status to penicillin: Secondary | ICD-10-CM | POA: Diagnosis not present

## 2022-12-09 DIAGNOSIS — I25118 Atherosclerotic heart disease of native coronary artery with other forms of angina pectoris: Secondary | ICD-10-CM | POA: Diagnosis present

## 2022-12-09 DIAGNOSIS — R112 Nausea with vomiting, unspecified: Secondary | ICD-10-CM

## 2022-12-09 DIAGNOSIS — E876 Hypokalemia: Secondary | ICD-10-CM | POA: Diagnosis present

## 2022-12-09 DIAGNOSIS — Z7982 Long term (current) use of aspirin: Secondary | ICD-10-CM | POA: Diagnosis not present

## 2022-12-09 DIAGNOSIS — N179 Acute kidney failure, unspecified: Secondary | ICD-10-CM | POA: Diagnosis present

## 2022-12-09 DIAGNOSIS — I69398 Other sequelae of cerebral infarction: Secondary | ICD-10-CM | POA: Diagnosis not present

## 2022-12-09 DIAGNOSIS — I6529 Occlusion and stenosis of unspecified carotid artery: Secondary | ICD-10-CM | POA: Diagnosis present

## 2022-12-09 DIAGNOSIS — E1365 Other specified diabetes mellitus with hyperglycemia: Secondary | ICD-10-CM | POA: Diagnosis present

## 2022-12-09 DIAGNOSIS — Z7984 Long term (current) use of oral hypoglycemic drugs: Secondary | ICD-10-CM | POA: Diagnosis not present

## 2022-12-09 DIAGNOSIS — Z794 Long term (current) use of insulin: Secondary | ICD-10-CM | POA: Diagnosis not present

## 2022-12-09 DIAGNOSIS — K219 Gastro-esophageal reflux disease without esophagitis: Secondary | ICD-10-CM | POA: Diagnosis present

## 2022-12-09 DIAGNOSIS — I214 Non-ST elevation (NSTEMI) myocardial infarction: Secondary | ICD-10-CM | POA: Diagnosis present

## 2022-12-09 DIAGNOSIS — Z886 Allergy status to analgesic agent status: Secondary | ICD-10-CM | POA: Diagnosis not present

## 2022-12-09 DIAGNOSIS — D649 Anemia, unspecified: Secondary | ICD-10-CM | POA: Diagnosis not present

## 2022-12-09 DIAGNOSIS — E139 Other specified diabetes mellitus without complications: Secondary | ICD-10-CM | POA: Diagnosis not present

## 2022-12-09 DIAGNOSIS — R7989 Other specified abnormal findings of blood chemistry: Secondary | ICD-10-CM

## 2022-12-09 DIAGNOSIS — K2270 Barrett's esophagus without dysplasia: Secondary | ICD-10-CM | POA: Diagnosis present

## 2022-12-09 DIAGNOSIS — E7800 Pure hypercholesterolemia, unspecified: Secondary | ICD-10-CM | POA: Diagnosis present

## 2022-12-09 DIAGNOSIS — M1990 Unspecified osteoarthritis, unspecified site: Secondary | ICD-10-CM | POA: Diagnosis present

## 2022-12-09 DIAGNOSIS — K8020 Calculus of gallbladder without cholecystitis without obstruction: Secondary | ICD-10-CM | POA: Diagnosis present

## 2022-12-09 DIAGNOSIS — Z7985 Long-term (current) use of injectable non-insulin antidiabetic drugs: Secondary | ICD-10-CM | POA: Diagnosis not present

## 2022-12-09 DIAGNOSIS — Z7902 Long term (current) use of antithrombotics/antiplatelets: Secondary | ICD-10-CM | POA: Diagnosis not present

## 2022-12-09 DIAGNOSIS — I10 Essential (primary) hypertension: Secondary | ICD-10-CM | POA: Diagnosis present

## 2022-12-09 LAB — GLUCOSE, CAPILLARY
Glucose-Capillary: 168 mg/dL — ABNORMAL HIGH (ref 70–99)
Glucose-Capillary: 187 mg/dL — ABNORMAL HIGH (ref 70–99)
Glucose-Capillary: 194 mg/dL — ABNORMAL HIGH (ref 70–99)
Glucose-Capillary: 200 mg/dL — ABNORMAL HIGH (ref 70–99)
Glucose-Capillary: 211 mg/dL — ABNORMAL HIGH (ref 70–99)
Glucose-Capillary: 234 mg/dL — ABNORMAL HIGH (ref 70–99)
Glucose-Capillary: 277 mg/dL — ABNORMAL HIGH (ref 70–99)

## 2022-12-09 LAB — CBC
HCT: 28.3 % — ABNORMAL LOW (ref 36.0–46.0)
Hemoglobin: 9.3 g/dL — ABNORMAL LOW (ref 12.0–15.0)
MCH: 29 pg (ref 26.0–34.0)
MCHC: 32.9 g/dL (ref 30.0–36.0)
MCV: 88.2 fL (ref 80.0–100.0)
Platelets: 245 10*3/uL (ref 150–400)
RBC: 3.21 MIL/uL — ABNORMAL LOW (ref 3.87–5.11)
RDW: 13.4 % (ref 11.5–15.5)
WBC: 9.7 10*3/uL (ref 4.0–10.5)
nRBC: 0 % (ref 0.0–0.2)

## 2022-12-09 LAB — ECHOCARDIOGRAM COMPLETE
AR max vel: 1.33 cm2
AV Area VTI: 1.52 cm2
AV Area mean vel: 1.36 cm2
AV Mean grad: 6 mmHg
AV Peak grad: 10.1 mmHg
Ao pk vel: 1.59 m/s
Area-P 1/2: 4.29 cm2
Height: 60 in
S' Lateral: 3.2 cm
Single Plane A4C EF: 52.3 %
Weight: 1940.8 oz

## 2022-12-09 LAB — BASIC METABOLIC PANEL
Anion gap: 8 (ref 5–15)
BUN: 22 mg/dL (ref 8–23)
CO2: 22 mmol/L (ref 22–32)
Calcium: 8.7 mg/dL — ABNORMAL LOW (ref 8.9–10.3)
Chloride: 109 mmol/L (ref 98–111)
Creatinine, Ser: 0.91 mg/dL (ref 0.44–1.00)
GFR, Estimated: >60 mL/min (ref >60)
Glucose, Bld: 227 mg/dL — ABNORMAL HIGH (ref 70–99)
Potassium: 3.8 mmol/L (ref 3.5–5.1)
Sodium: 139 mmol/L (ref 135–145)

## 2022-12-09 LAB — HEPARIN LEVEL (UNFRACTIONATED): Heparin Unfractionated: 0.49 IU/mL (ref 0.30–0.70)

## 2022-12-09 MED ORDER — LATANOPROST 0.005 % OP SOLN
1.0000 [drp] | Freq: Every day | OPHTHALMIC | Status: DC
  Administered 2022-12-09: 1 [drp] via OPHTHALMIC
  Filled 2022-12-09: qty 2.5

## 2022-12-09 MED ORDER — TIMOLOL HEMIHYDRATE 0.5 % OP SOLN: 1.0000 [drp] | Freq: Two times a day (BID) | OPHTHALMIC | Status: DC

## 2022-12-09 MED ORDER — POTASSIUM CHLORIDE IN NACL 40-0.9 MEQ/L-% IV SOLN
INTRAVENOUS | Status: DC
  Filled 2022-12-09 (×2): qty 1000

## 2022-12-09 MED ORDER — TIMOLOL MALEATE 0.5 % OP SOLN
1.0000 [drp] | Freq: Two times a day (BID) | OPHTHALMIC | Status: DC
  Administered 2022-12-09 – 2022-12-10 (×3): 1 [drp] via OPHTHALMIC
  Filled 2022-12-09 (×2): qty 5

## 2022-12-09 MED ORDER — PANTOPRAZOLE SODIUM 40 MG PO TBEC
40.0000 mg | DELAYED_RELEASE_TABLET | Freq: Two times a day (BID) | ORAL | Status: DC
  Administered 2022-12-09 – 2022-12-10 (×2): 40 mg via ORAL
  Filled 2022-12-09 (×2): qty 1

## 2022-12-09 MED ORDER — CALCIUM CARBONATE ANTACID 500 MG PO CHEW
1.0000 | CHEWABLE_TABLET | Freq: Two times a day (BID) | ORAL | Status: DC
  Administered 2022-12-09 – 2022-12-10 (×2): 200 mg via ORAL
  Filled 2022-12-09 (×2): qty 1

## 2022-12-09 NOTE — Plan of Care (Signed)

## 2022-12-09 NOTE — Progress Notes (Signed)
PROGRESS NOTE    Nancy Sutton  [id number] DOB: May 18, 1946 DOA: 12/08/2022 PCP: Redmond School, MD    Brief Narrative:  77 year old with history of stroke with residual left hemianopsia, hypertension and type 2 diabetes presented to the emergency room with nausea and vomiting for 2 days, poor oral intake that started after eating out in the restaurant.  In the emergency room hemodynamically stable.  100% on room air.  CT scan abdomen pelvis without acute abnormality, shows multiple gallstones and gallbladder.  EKG showed T wave changes, troponins were 253-823.  Cardiology was consulted.  Admitted to [facility] as a transfer from [facility], started on heparin infusion.   Assessment & Plan:   Non-ST elevation MI: Presented with nausea vomiting, thought to be angina variant.  EKG with ischemic changes, troponins elevated. Continue on Plavix, currently on heparin infusion, continued on high intensity statin. Nitroglycerin for for recurrent pain.  Morphine for recurrent pain.  Currently without chest pain. Maintenance IV fluids.  Echocardiogram pending. Cardiology following, possible cardiac cath today or echocardiogram and monitoring with elective cath.  Type 2 diabetes with hyperglycemia: Uncontrolled.  Known A1c 7.2.  She had not taken insulin at home for last few days.  Resumed on insulin on reduced doses.  Holding metformin.  Continue sliding scale insulin.  Hydration.  Blood sugars better today.  Will adjust once she is eating regular diet.  Essential hypertension: Blood pressure stable.  History of a stroke with residual left hemianopsia: Stable.  No new neurological deficit. Patient on Plavix, not on aspirin.  Continued.  GERD: Does have history of significant GERD.  On Protonix.  Will increase dose to twice daily.    DVT prophylaxis: Heparin infusion   Code Status: Full code Family Communication: Husband at the bedside Disposition Plan: Status is:  Observation The patient will require care spanning > 2 midnights and should be moved to inpatient because: Inpatient procedures planned, cardiology workup planned     Consultants:  Cardiology  Procedures:  None  Antimicrobials:  None   Subjective: Patient seen and examined.  Husband at the bedside.  Patient tells me she is still has episodes of nausea but denies any shortness of breath or chest pain.  She is wearing a Nitropatch.  Blood pressures are stable.  Objective: Vitals:   12/08/22 2000 12/09/22 0004 12/09/22 0416 12/09/22 0734  BP: (!) 144/60 (!) 137/53 (!) 155/60 (!) 155/65  Pulse: 98 95 95 89  Resp: 17 18 17 18   Temp: 99.1 F (37.3 C) 100.1 F (37.8 C) 99.7 F (37.6 C) 98.8 F (37.1 C)  TempSrc: Oral Oral Oral Oral  SpO2: 96% 93% 95% 95%  Weight:   55 kg   Height:        Intake/Output Summary (Last 24 hours) at 12/09/2022 0805 Last data filed at 12/08/2022 2120 Gross per 24 hour  Intake 1049 ml  Output 450 ml  Net 599 ml   Filed Weights   12/08/22 1033 12/09/22 0416  Weight: 55.3 kg 55 kg    Examination:  General exam: Appears calm and comfortable  Respiratory system: No added sounds. Cardiovascular system: S1 & S2 heard, RRR. No pedal edema. Gastrointestinal system: Soft.  Nontender.  Bowel sound present.   Central nervous system: Alert and oriented. No focal neurological deficits. Extremities: Symmetric 5 x 5 power. Skin: No rashes, lesions or ulcers Psychiatry: Judgement and insight appear normal. Mood & affect appropriate.     Data Reviewed: I have personally reviewed  following labs and imaging studies  CBC: Recent Labs  Lab 12/08/22 1049 12/09/22 0036  WBC 9.5 9.7  NEUTROABS 8.0*  --   HGB 12.1 9.3*  HCT 36.2 28.3*  MCV 87.7 88.2  PLT 314 [id number]   Basic Metabolic Panel: Recent Labs  Lab 12/08/22 1049 12/09/22 0036  NA 134* 139  K 3.5 3.8  CL 95* 109  CO2 25 22  GLUCOSE 453* 227*  BUN 31* 22  CREATININE 1.16* 0.91  CALCIUM  10.1 8.7*   GFR: Estimated Creatinine Clearance: 40.3 mL/min (by C-G formula based on SCr of 0.91 mg/dL). Liver Function Tests: Recent Labs  Lab 12/08/22 1049  AST 34  ALT 21  ALKPHOS 63  BILITOT 1.1  PROT 8.5*  ALBUMIN 4.4   Recent Labs  Lab 12/08/22 1049  LIPASE 27   No results for input(s): "AMMONIA" in the last 168 hours. Coagulation Profile: No results for input(s): "INR", "PROTIME" in the last 168 hours. Cardiac Enzymes: No results for input(s): "CKTOTAL", "CKMB", "CKMBINDEX", "TROPONINI" in the last 168 hours. BNP (last 3 results) No results for input(s): "PROBNP" in the last 8760 hours. HbA1C: No results for input(s): "HGBA1C" in the last 72 hours. CBG: Recent Labs  Lab 12/08/22 1804 12/08/22 1836 12/08/22 1957 12/09/22 0003 12/09/22 0418  GLUCAP 234* 254* 282* 200* 168*   Lipid Profile: No results for input(s): "CHOL", "HDL", "LDLCALC", "TRIG", "CHOLHDL", "LDLDIRECT" in the last 72 hours. Thyroid Function Tests: No results for input(s): "TSH", "T4TOTAL", "FREET4", "T3FREE", "THYROIDAB" in the last 72 hours. Anemia Panel: No results for input(s): "VITAMINB12", "FOLATE", "FERRITIN", "TIBC", "IRON", "RETICCTPCT" in the last 72 hours. Sepsis Labs: No results for input(s): "PROCALCITON", "LATICACIDVEN" in the last 168 hours.  No results found for this or any previous visit (from the past 240 hour(s)).       Radiology Studies: CT ABDOMEN PELVIS W CONTRAST  Result Date: 12/08/2022 CLINICAL DATA:  Vomiting over the last 3 days. Bowel obstruction suspected. EXAM: CT ABDOMEN AND PELVIS WITH CONTRAST TECHNIQUE: Multidetector CT imaging of the abdomen and pelvis was performed using the standard protocol following bolus administration of intravenous contrast. RADIATION DOSE REDUCTION: This exam was performed according to the departmental dose-optimization program which includes automated exposure control, adjustment of the mA and/or kV according to patient size  and/or use of iterative reconstruction technique. CONTRAST:  152mL OMNIPAQUE IOHEXOL 300 MG/ML  SOLN COMPARISON:  None Available. FINDINGS: Lower chest: Mild scarring at the lung bases. No active process. Small hiatal hernia. Hepatobiliary: Few small calcified granulomas. Scattered small cysts. No worrisome liver lesion. The gallbladder contains multiple small stones, 1-4 mm in size, the size that would be at high risk of passing into the ductal system. However, there is no ductal dilatation or visible ductal stone. No CT evidence of cholecystitis. If concern persists, consider ultrasound or nuclear medicine scan. Pancreas: Normal Spleen: Normal Adrenals/Urinary Tract: Adrenal glands are normal. Kidneys are normal. No cyst, mass, stone or hydronephrosis. Bladder is full but normal. Stomach/Bowel: Small hiatal hernia as noted above. Stomach is otherwise normal. The small bowel is normal. No obstruction or ileus. Normal appendix. Normal colon. Vascular/Lymphatic: Aortic atherosclerosis. No aneurysm. IVC is normal. No adenopathy. Reproductive: No pelvic mass.  Few small leiomyomas of the uterus. Other: No free fluid or air. Musculoskeletal: Ordinary spinal degenerative changes. IMPRESSION: 1. No acute finding to explain the clinical presentation. No evidence of bowel obstruction or ileus. 2. Small hiatal hernia. 3. Multiple small stones in the gallbladder, 1-4 mm  in size, the size that would be at high risk of passing into the ductal system. However, there is no ductal dilatation or visible ductal stone. No CT evidence of cholecystitis. If concern persists, consider ultrasound or nuclear medicine scan. 4. Aortic atherosclerosis. 5. Few small leiomyomas of the uterus. Aortic Atherosclerosis (ICD10-I70.0). Electronically Signed   By: Nelson Chimes M.D.   On: 12/08/2022 15:40        Scheduled Meds:  atorvastatin  40 mg Oral Daily   clopidogrel  75 mg Oral Daily   insulin aspart  0-9 Units Subcutaneous Q4H    insulin detemir  10 Units Subcutaneous QHS   nitroGLYCERIN  1 inch Topical Q6H   pantoprazole  40 mg Oral Daily   Continuous Infusions:  0.9 % NaCl with KCl 40 mEq / L 100 mL/hr at 12/09/22 0624   heparin 700 Units/hr (12/08/22 1325)     LOS: 0 days    Time spent: 35 minutes    Barb Merino, MD [facility] Pager [phone]

## 2022-12-09 NOTE — Progress Notes (Signed)
ANTICOAGULATION CONSULT NOTE - Follow Up Consult  Pharmacy Consult for Heparin Indication: chest pain/ACS  Allergies  Allergen Reactions   Ampicillin Hives    Breaks out in bumps Has patient had a PCN reaction causing immediate rash, facial/tongue/throat swelling, SOB or lightheadedness with hypotension: No Has patient had a PCN reaction causing severe rash involving mucus membranes or skin necrosis: Yes Has patient had a PCN reaction that required hospitalization: No Has patient had a PCN reaction occurring within the last 10 years: No If all of the above answers are "NO", then may proceed with Cephalosporin use.    Bromday [Bromfenac Sodium] Itching and Swelling    Eye Drops   Lisinopril Cough   Tetanus Toxoids Hives    Breaks out in bumps    Patient Measurements: Height: 5' (152.4 cm) Weight: 55 kg (121 lb 4.8 oz) IBW/kg (Calculated) : 45.5 Heparin Dosing Weight: 55 kg  Vital Signs: Temp: 99.3 F (37.4 C) (03/21 1115) Temp Source: Oral (03/21 1115) BP: 143/61 (03/21 1115) Pulse Rate: 95 (03/21 1115)  Labs: Recent Labs    12/08/22 1049 12/08/22 1452 12/08/22 1942 12/09/22 0036  HGB 12.1  --   --  9.3*  HCT 36.2  --   --  28.3*  PLT 314  --   --  245  HEPARINUNFRC  --   --  0.47 0.49  CREATININE 1.16*  --   --  0.91  TROPONINIHS 253* 823* 740*  --      Estimated Creatinine Clearance: 40.3 mL/min (by C-G formula based on SCr of 0.91 mg/dL).  Assessment: 77 year old female admitted to [facility] for nausea/vomiting for the past 3 days. Patient denies chest pain, troponin elevated at 253. Patient not on anticoagulants prior to admission. Heparin begun for possible ACS.     Troponins elevated  Transferred to [facility]  Heparin drip rate 700 uts/hr  heparin level is therapeutic (0.49) hgb lower than admit no bleeding noted - follow up cbc in am   Goal of Therapy:  Heparin level 0.3-0.7 units/ml Monitor platelets by anticoagulation protocol: Yes   Plan:  Continue  heparin drip at 700 units/hr Daily heparin level and CBC while on heparin.    Bonnita Nasuti Pharm.D. CPP, BCPS Clinical Pharmacist [phone] 12/09/2022 3:12 PM

## 2022-12-09 NOTE — Progress Notes (Signed)
Rounding Note    Patient Name: Nancy Sutton Date of Encounter: 12/09/2022  Nancy Sutton: Nancy Lean, MD Nancy Sutton)  Subjective   Reviewed recent history. Felt poorly for a time last week, though she had food poisoning. Had nausea/vomiting. Felt better briefly, and then symptoms recurred 3/18. She has had minimal PO intake and could not keep food in, so she stopping taking insulin while she was ill. Has not had any chest pain or shortness of breath.  Inpatient Medications    Scheduled Meds:  atorvastatin  40 mg Oral Daily   clopidogrel  75 mg Oral Daily   insulin aspart  0-9 Units Subcutaneous Q4H   insulin detemir  10 Units Subcutaneous QHS   nitroGLYCERIN  1 inch Topical Q6H   pantoprazole  40 mg Oral Daily   Continuous Infusions:  0.9 % NaCl with KCl 40 mEq / L 100 mL/hr at 12/09/22 0624   heparin 700 Units/hr (12/08/22 1325)   PRN Meds: acetaminophen **OR** acetaminophen, hydrALAZINE, metoCLOPramide (REGLAN) injection, polyethylene glycol   Vital Signs    Vitals:   12/08/22 2000 12/09/22 0004 12/09/22 0416 12/09/22 0734  BP: (!) 144/60 (!) 137/53 (!) 155/60 (!) 155/65  Pulse: 98 95 95 89  Resp: 17 18 17 18   Temp: 99.1 F (37.3 C) 100.1 F (37.8 C) 99.7 F (37.6 C) 98.8 F (37.1 C)  TempSrc: Oral Oral Oral Oral  SpO2: 96% 93% 95% 95%  Weight:   55 kg   Height:        Intake/Output Summary (Last 24 hours) at 12/09/2022 1051 Last data filed at 12/09/2022 0930 Gross per 24 hour  Intake 1049 ml  Output 450 ml  Net 599 ml      12/09/2022    4:16 AM 12/08/2022   10:33 AM 10/01/2022   10:48 AM  Last 3 Weights  Weight (lbs) 121 lb 4.8 oz 122 lb 123 lb 9.6 oz  Weight (kg) 55.021 kg 55.339 kg 56.065 kg      Telemetry    SR - Personally Reviewed  ECG    SR with ST/T wave changes inferior, st depressions lateral - Personally Reviewed  Physical Exam   GEN: No acute distress.   Neck: No JVD Cardiac: RRR, no murmurs,  rubs, or gallops.  Respiratory: Clear to auscultation bilaterally. GI: Soft, nontender, non-distended  MS: No edema; No deformity. Neuro:  Nonfocal  Psych: Normal affect   Labs    High Sensitivity Troponin:   Recent Labs  Lab 12/08/22 1049 12/08/22 1452 12/08/22 1942  TROPONINIHS 253* 823* 740*     Chemistry Recent Labs  Lab 12/08/22 1049 12/09/22 0036  NA 134* 139  K 3.5 3.8  CL 95* 109  CO2 25 22  GLUCOSE 453* 227*  BUN 31* 22  CREATININE 1.16* 0.91  CALCIUM 10.1 8.7*  PROT 8.5*  --   ALBUMIN 4.4  --   AST 34  --   ALT 21  --   ALKPHOS 63  --   BILITOT 1.1  --   GFRNONAA 49* >60  ANIONGAP 14 8    Lipids No results for input(s): "CHOL", "TRIG", "HDL", "LABVLDL", "LDLCALC", "CHOLHDL" in the last 168 hours.  Hematology Recent Labs  Lab 12/08/22 1049 12/09/22 0036  WBC 9.5 9.7  RBC 4.13 3.21*  HGB 12.1 9.3*  HCT 36.2 28.3*  MCV 87.7 88.2  MCH 29.3 29.0  MCHC 33.4 32.9  RDW 13.2 13.4  PLT 314 245  Thyroid No results for input(s): "TSH", "FREET4" in the last 168 hours.  BNPNo results for input(s): "BNP", "PROBNP" in the last 168 hours.  DDimer No results for input(s): "DDIMER" in the last 168 hours.   Radiology    CT ABDOMEN PELVIS W CONTRAST  Result Date: 12/08/2022 CLINICAL DATA:  Vomiting over the last 3 days. Bowel obstruction suspected. EXAM: CT ABDOMEN AND PELVIS WITH CONTRAST TECHNIQUE: Multidetector CT imaging of the abdomen and pelvis was performed using the standard protocol following bolus administration of intravenous contrast. RADIATION DOSE REDUCTION: This exam was performed according to the departmental dose-optimization program which includes automated exposure control, adjustment of the mA and/or kV according to patient size and/or use of iterative reconstruction technique. CONTRAST:  126mL OMNIPAQUE IOHEXOL 300 MG/ML  SOLN COMPARISON:  None Available. FINDINGS: Lower chest: Mild scarring at the lung bases. No active process. Small hiatal  hernia. Hepatobiliary: Few small calcified granulomas. Scattered small cysts. No worrisome liver lesion. The gallbladder contains multiple small stones, 1-4 mm in size, the size that would be at high risk of passing into the ductal system. However, there is no ductal dilatation or visible ductal stone. No CT evidence of cholecystitis. If concern persists, consider ultrasound or nuclear medicine scan. Pancreas: Normal Spleen: Normal Adrenals/Urinary Tract: Adrenal glands are normal. Kidneys are normal. No cyst, mass, stone or hydronephrosis. Bladder is full but normal. Stomach/Bowel: Small hiatal hernia as noted above. Stomach is otherwise normal. The small bowel is normal. No obstruction or ileus. Normal appendix. Normal colon. Vascular/Lymphatic: Aortic atherosclerosis. No aneurysm. IVC is normal. No adenopathy. Reproductive: No pelvic mass.  Few small leiomyomas of the uterus. Other: No free fluid or air. Musculoskeletal: Ordinary spinal degenerative changes. IMPRESSION: 1. No acute finding to explain the clinical presentation. No evidence of bowel obstruction or ileus. 2. Small hiatal hernia. 3. Multiple small stones in the gallbladder, 1-4 mm in size, the size that would be at high risk of passing into the ductal system. However, there is no ductal dilatation or visible ductal stone. No CT evidence of cholecystitis. If concern persists, consider ultrasound or nuclear medicine scan. 4. Aortic atherosclerosis. 5. Few small leiomyomas of the uterus. Aortic Atherosclerosis (ICD10-I70.0). Electronically Signed   By: Nancy Sutton M.D.   On: 12/08/2022 15:40    Cardiac Studies   No prior available in our system  Patient Profile     77 y.o. female with type II diabetes on insulin, hyperlipidemia, prior history of CVA, reported mild carotid artery stenosis. She reports several days of nausea/vomiting, during which time she did not take her insulin. She presented with hyperglycemia, nausea/vomiting to [facility]  and was found to have elevated troponin. Sent to [facility] in case cath needed.  Assessment & Plan    Elevated troponin -denies chest pain, shortness of breath -has had nausea/vomiting for days. Given this, if her symptoms were secondary to an MI, would have expected that hsTn would be very high and downtrending on presentation. Rather, her hsTn was 253 on presentation, peaked at 823 several hours later, and is now downtrending to 740. This suggests that she may have demand ischemia instead -I would expect given her long term diabetes, she likely will have some degree of CAD. The bigger question is whether she is having an NSTEMI and has a culprit lesion that could be treated. -we discussed options for further evaluation today. Discussed cath today vs. Getting echo, then proceeding with cath only if echo significantly abnormal. She is feeling much  better today, would like to proceed with echo first and then cath only if abnormal echo -continue heparin for now -continue atorvastatin, clopidogrel -ECG with LVH, repol vs. Inferolateral ST depressions. Last prior was 2014 -if she develops chest discomfort or worsening symptoms, instructed her to call nursing team so that we can treat  Type II diabetes -presented with elevated glucose. No acidosis or gap.  In the setting of no insulin for several days due to GI symptoms -per primary team  For questions or updates, please contact [place] Please consult [email or web address] for contact info under        Signed, Buford Dresser, MD  12/09/2022, 10:51 AM

## 2022-12-09 NOTE — TOC Progression Note (Signed)
Transition of Care [facility]) - Progression Note    Patient Details  Name: Nancy Sutton MRN: [id number] Date of Birth: Aug 03, 1946  [facility]) CM/SW Contact  Zenon Mayo, RN Phone Number: 12/09/2022, 10:12 AM  Clinical Narrative:     Transition of Care [facility]) Screening Note   Patient Details  Name: Nancy Sutton Date of Birth: 1946/09/15   Transition of Care [facility]) CM/SW Contact:    Zenon Mayo, RN Phone Number: 12/09/2022, 10:13 AM    Transition of Care Department [facility]) has reviewed patient and no TOC needs have been identified at this time. We will continue to monitor patient advancement through interdisciplinary progression rounds. If new patient transition needs arise, please place a TOC consult.from home with spouse, NSTEMI, conts on hep drip, , IVF's, npo for cath later today.            Expected Discharge Plan and Services                                               Social Determinants of Health (SDOH) Interventions SDOH Screenings   Tobacco Use: Low Risk  (12/08/2022)    Readmission Risk Interventions     No data to display

## 2022-12-10 ENCOUNTER — Encounter (HOSPITAL_COMMUNITY)
Admission: EM | Disposition: A | Discharge status: Home / Self Care | Payer: Self-pay | Source: Home / Self Care | Attending: Internal Medicine

## 2022-12-10 ENCOUNTER — Encounter (HOSPITAL_COMMUNITY): Payer: Self-pay | Admitting: Internal Medicine

## 2022-12-10 DIAGNOSIS — I2510 Atherosclerotic heart disease of native coronary artery without angina pectoris: Secondary | ICD-10-CM

## 2022-12-10 DIAGNOSIS — I214 Non-ST elevation (NSTEMI) myocardial infarction: Secondary | ICD-10-CM | POA: Diagnosis not present

## 2022-12-10 HISTORY — PX: LEFT HEART CATH AND CORONARY ANGIOGRAPHY: CATH118249

## 2022-12-10 HISTORY — PX: CORONARY STENT INTERVENTION: CATH118234

## 2022-12-10 LAB — BASIC METABOLIC PANEL
Anion gap: 6 (ref 5–15)
BUN: 16 mg/dL (ref 8–23)
CO2: 21 mmol/L — ABNORMAL LOW (ref 22–32)
Calcium: 8.6 mg/dL — ABNORMAL LOW (ref 8.9–10.3)
Chloride: 110 mmol/L (ref 98–111)
Creatinine, Ser: 0.73 mg/dL (ref 0.44–1.00)
GFR, Estimated: >60 mL/min (ref >60)
Glucose, Bld: 177 mg/dL — ABNORMAL HIGH (ref 70–99)
Potassium: 4.2 mmol/L (ref 3.5–5.1)
Sodium: 137 mmol/L (ref 135–145)

## 2022-12-10 LAB — GLUCOSE, CAPILLARY
Glucose-Capillary: 126 mg/dL — ABNORMAL HIGH (ref 70–99)
Glucose-Capillary: 138 mg/dL — ABNORMAL HIGH (ref 70–99)
Glucose-Capillary: 143 mg/dL — ABNORMAL HIGH (ref 70–99)
Glucose-Capillary: 149 mg/dL — ABNORMAL HIGH (ref 70–99)
Glucose-Capillary: 156 mg/dL — ABNORMAL HIGH (ref 70–99)
Glucose-Capillary: 179 mg/dL — ABNORMAL HIGH (ref 70–99)

## 2022-12-10 LAB — CBC
HCT: 28.8 % — ABNORMAL LOW (ref 36.0–46.0)
Hemoglobin: 9.2 g/dL — ABNORMAL LOW (ref 12.0–15.0)
MCH: 29.1 pg (ref 26.0–34.0)
MCHC: 31.9 g/dL (ref 30.0–36.0)
MCV: 91.1 fL (ref 80.0–100.0)
Platelets: 213 10*3/uL (ref 150–400)
RBC: 3.16 MIL/uL — ABNORMAL LOW (ref 3.87–5.11)
RDW: 13.5 % (ref 11.5–15.5)
WBC: 11.4 10*3/uL — ABNORMAL HIGH (ref 4.0–10.5)
nRBC: 0 % (ref 0.0–0.2)

## 2022-12-10 LAB — POCT ACTIVATED CLOTTING TIME
Activated Clotting Time: 282 seconds
Activated Clotting Time: 282 seconds

## 2022-12-10 LAB — HEPARIN LEVEL (UNFRACTIONATED): Heparin Unfractionated: 0.4 IU/mL (ref 0.30–0.70)

## 2022-12-10 SURGERY — LEFT HEART CATH AND CORONARY ANGIOGRAPHY
Anesthesia: LOCAL

## 2022-12-10 MED ORDER — FENTANYL CITRATE (PF) 100 MCG/2ML IJ SOLN
INTRAMUSCULAR | Status: AC
  Filled 2022-12-10: qty 2

## 2022-12-10 MED ORDER — VERAPAMIL HCL 2.5 MG/ML IV SOLN
INTRAVENOUS | Status: AC
  Filled 2022-12-10: qty 2

## 2022-12-10 MED ORDER — SODIUM CHLORIDE 0.9 % IV SOLN: 250.0000 mL | INTRAVENOUS | Status: DC | PRN

## 2022-12-10 MED ORDER — SODIUM CHLORIDE 0.9% FLUSH
3.0000 mL | Freq: Two times a day (BID) | INTRAVENOUS | Status: DC
  Administered 2022-12-10: 3 mL via INTRAVENOUS

## 2022-12-10 MED ORDER — MIDAZOLAM HCL 2 MG/2ML IJ SOLN
INTRAMUSCULAR | Status: DC | PRN
  Administered 2022-12-10: 1 mg via INTRAVENOUS

## 2022-12-10 MED ORDER — ONDANSETRON HCL 4 MG/2ML IJ SOLN: 4.0000 mg | Freq: Four times a day (QID) | INTRAMUSCULAR | Status: DC | PRN

## 2022-12-10 MED ORDER — SODIUM CHLORIDE 0.9 % WEIGHT BASED INFUSION: 3.0000 mL/kg/h | INTRAVENOUS | Status: DC

## 2022-12-10 MED ORDER — SODIUM CHLORIDE 0.9 % WEIGHT BASED INFUSION
1.0000 mL/kg/h | INTRAVENOUS | Status: AC
  Administered 2022-12-10: 1 mL/kg/h via INTRAVENOUS

## 2022-12-10 MED ORDER — IOHEXOL 350 MG/ML SOLN
INTRAVENOUS | Status: DC | PRN
  Administered 2022-12-10: 110 mL

## 2022-12-10 MED ORDER — MIDAZOLAM HCL 2 MG/2ML IJ SOLN
INTRAMUSCULAR | Status: AC
  Filled 2022-12-10: qty 2

## 2022-12-10 MED ORDER — ASPIRIN 81 MG PO TBEC
81.0000 mg | DELAYED_RELEASE_TABLET | Freq: Every day | ORAL | Status: DC
  Administered 2022-12-11: 81 mg via ORAL
  Filled 2022-12-10: qty 1

## 2022-12-10 MED ORDER — LIDOCAINE HCL (PF) 1 % IJ SOLN
INTRAMUSCULAR | Status: AC
  Filled 2022-12-10: qty 30

## 2022-12-10 MED ORDER — ASPIRIN 81 MG PO CHEW
324.0000 mg | CHEWABLE_TABLET | Freq: Once | ORAL | Status: AC
  Administered 2022-12-10: 324 mg via ORAL
  Filled 2022-12-10: qty 4

## 2022-12-10 MED ORDER — CALCIUM CARBONATE ANTACID 500 MG PO CHEW
1.0000 | CHEWABLE_TABLET | Freq: Three times a day (TID) | ORAL | Status: DC
  Administered 2022-12-10 – 2022-12-11 (×4): 200 mg via ORAL
  Filled 2022-12-10 (×5): qty 1

## 2022-12-10 MED ORDER — CARVEDILOL 3.125 MG PO TABS
3.1250 mg | ORAL_TABLET | Freq: Two times a day (BID) | ORAL | Status: DC
  Administered 2022-12-10 – 2022-12-11 (×3): 3.125 mg via ORAL
  Filled 2022-12-10 (×3): qty 1

## 2022-12-10 MED ORDER — SODIUM CHLORIDE 0.9 % WEIGHT BASED INFUSION: 1.0000 mL/kg/h | INTRAVENOUS | Status: DC

## 2022-12-10 MED ORDER — VERAPAMIL HCL 2.5 MG/ML IV SOLN
INTRAVENOUS | Status: DC | PRN
  Administered 2022-12-10: 10 mL via INTRA_ARTERIAL

## 2022-12-10 MED ORDER — FENTANYL CITRATE (PF) 100 MCG/2ML IJ SOLN
INTRAMUSCULAR | Status: DC | PRN
  Administered 2022-12-10: 25 ug via INTRAVENOUS

## 2022-12-10 MED ORDER — SODIUM CHLORIDE 0.9% FLUSH: 3.0000 mL | INTRAVENOUS | Status: DC | PRN

## 2022-12-10 MED ORDER — NITROGLYCERIN 1 MG/10 ML FOR IR/CATH LAB
INTRA_ARTERIAL | Status: DC | PRN
  Administered 2022-12-10 (×2): 150 ug via INTRACORONARY

## 2022-12-10 MED ORDER — SODIUM CHLORIDE 0.9 % IV SOLN: INTRAVENOUS | Status: DC

## 2022-12-10 MED ORDER — NITROGLYCERIN 1 MG/10 ML FOR IR/CATH LAB
INTRA_ARTERIAL | Status: AC
  Filled 2022-12-10: qty 10

## 2022-12-10 MED ORDER — LABETALOL HCL 5 MG/ML IV SOLN
10.0000 mg | INTRAVENOUS | Status: AC | PRN
  Administered 2022-12-10 (×2): 10 mg via INTRAVENOUS
  Filled 2022-12-10 (×2): qty 4

## 2022-12-10 MED ORDER — PANTOPRAZOLE SODIUM 40 MG IV SOLR
40.0000 mg | Freq: Two times a day (BID) | INTRAVENOUS | Status: DC
  Administered 2022-12-10 – 2022-12-11 (×2): 40 mg via INTRAVENOUS
  Filled 2022-12-10 (×3): qty 10

## 2022-12-10 MED ORDER — HEPARIN SODIUM (PORCINE) 1000 UNIT/ML IJ SOLN
INTRAMUSCULAR | Status: AC
  Filled 2022-12-10: qty 10

## 2022-12-10 MED ORDER — HEPARIN SODIUM (PORCINE) 1000 UNIT/ML IJ SOLN
INTRAMUSCULAR | Status: DC | PRN
  Administered 2022-12-10: 2000 [IU] via INTRAVENOUS
  Administered 2022-12-10: 4000 [IU] via INTRAVENOUS
  Administered 2022-12-10: 3000 [IU] via INTRAVENOUS

## 2022-12-10 MED ORDER — SODIUM CHLORIDE 0.9% FLUSH
3.0000 mL | Freq: Two times a day (BID) | INTRAVENOUS | Status: DC
  Administered 2022-12-11: 3 mL via INTRAVENOUS

## 2022-12-10 MED ORDER — LIDOCAINE HCL (PF) 1 % IJ SOLN
INTRAMUSCULAR | Status: DC | PRN
  Administered 2022-12-10: 2 mL via INTRADERMAL

## 2022-12-10 SURGICAL SUPPLY — 24 items
BALL SAPPHIRE NC24 3.25X12 (BALLOONS) ×1
BALLN EMERGE MR 2.5X12 (BALLOONS) ×1
BALLOON EMERGE MR 2.5X12 (BALLOONS) IMPLANT
BALLOON SAPPHIRE NC24 3.25X12 (BALLOONS) IMPLANT
CATH 5FR JL3.5 JR4 ANG PIG MP (CATHETERS) IMPLANT
CATH LAUNCHER 5F EBU3.0 (CATHETERS) IMPLANT
CATH VISTA GUIDE 6FR JR4 (CATHETERS) IMPLANT
CATHETER LAUNCHER 5F EBU3.0 (CATHETERS) ×1
DEVICE RAD COMP TR BAND LRG (VASCULAR PRODUCTS) IMPLANT
GLIDESHEATH SLEND SS 6F .021 (SHEATH) IMPLANT
GUIDEWIRE INQWIRE 1.5J.035X260 (WIRE) IMPLANT
INQWIRE 1.5J .035X260CM (WIRE) ×1
KIT ENCORE 26 ADVANTAGE (KITS) IMPLANT
KIT HEART LEFT (KITS) ×2 IMPLANT
PACK CARDIAC CATHETERIZATION (CUSTOM PROCEDURE TRAY) ×2 IMPLANT
STENT SYNERGY XD 3.0X16 (Permanent Stent) IMPLANT
STENT SYNERGY XD 3.0X20 (Permanent Stent) IMPLANT
SYNERGY XD 3.0X16 (Permanent Stent) ×1 IMPLANT
SYNERGY XD 3.0X20 (Permanent Stent) ×1 IMPLANT
TRANSDUCER W/STOPCOCK (MISCELLANEOUS) ×2 IMPLANT
TUBING CIL FLEX 10 FLL-RA (TUBING) ×2 IMPLANT
VALVE GUARDIAN II ~~LOC~~ HEMO (MISCELLANEOUS) IMPLANT
WIRE COUGAR XT STRL 300CM (WIRE) IMPLANT
WIRE HI TORQ VERSACORE-J 145CM (WIRE) IMPLANT

## 2022-12-10 NOTE — Progress Notes (Signed)
Progress Note  Patient Name: Nancy Sutton Date of Encounter: 12/10/2022  Primary Cardiologist: Werner Lean, MD  Subjective   Had recurrence of nausea this AM around 9am. No vomiting but has not been able to eat anything this AM. No CP or chest burning. No SOB. Family concerned because she felt better yesterday but now nausea has recurred.  Inpatient Medications    Scheduled Meds:  atorvastatin  40 mg Oral Daily   calcium carbonate  1 tablet Oral BID WC   clopidogrel  75 mg Oral Daily   insulin aspart  0-9 Units Subcutaneous Q4H   insulin detemir  10 Units Subcutaneous QHS   latanoprost  1 drop Both Eyes QHS   nitroGLYCERIN  1 inch Topical Q6H   pantoprazole  40 mg Oral BID   timolol  1 drop Both Eyes BID   Continuous Infusions:  heparin 700 Units/hr (12/09/22 1816)   PRN Meds: acetaminophen **OR** acetaminophen, hydrALAZINE, metoCLOPramide (REGLAN) injection, polyethylene glycol   Vital Signs    Vitals:   12/09/22 1620 12/09/22 2018 12/10/22 0028 12/10/22 0820  BP: (!) 170/70 (!) 151/68 (!) 151/68 (!) 183/80  Pulse: 88 91 86 85  Resp: 17 17 17 18   Temp: 99.3 F (37.4 C) 98.8 F (37.1 C) 98.8 F (37.1 C) 98.8 F (37.1 C)  TempSrc: Oral Oral Oral Oral  SpO2: 99% 95% 95% 95%  Weight:      Height:        Intake/Output Summary (Last 24 hours) at 12/10/2022 1039 Last data filed at 12/10/2022 0031 Gross per 24 hour  Intake 105.44 ml  Output 300 ml  Net -194.56 ml      12/09/2022    4:16 AM 12/08/2022   10:33 AM 10/01/2022   10:48 AM  Last 3 Weights  Weight (lbs) 121 lb 4.8 oz 122 lb 123 lb 9.6 oz  Weight (kg) 55.021 kg 55.339 kg 56.065 kg     Telemetry    NSR, ST depression - Personally Reviewed  ECG   Repeat today showed NSR 87bpm, continued ST depression II, avF, V4-V6, occasional PACs, TWI III - Personally Reviewed  Physical Exam   GEN: No acute distress.  HEENT: Normocephalic, atraumatic, sclera non-icteric. Neck: No JVD or  bruits. Cardiac: RRR no murmurs, rubs, or gallops.  Respiratory: Clear to auscultation bilaterally. Breathing is unlabored. GI: Soft, nontender, non-distended, BS +x 4. MS: no deformity. Extremities: No clubbing or cyanosis. No edema. Distal pedal pulses are 2+ and equal bilaterally. Neuro:  AAOx3. Follows commands. Psych:  Responds to questions appropriately with a normal affect.  Labs    High Sensitivity Troponin:   Recent Labs  Lab 12/08/22 1049 12/08/22 1452 12/08/22 1942  TROPONINIHS 253* 823* 740*      Cardiac EnzymesNo results for input(s): "TROPONINI" in the last 168 hours. No results for input(s): "TROPIPOC" in the last 168 hours.   Chemistry Recent Labs  Lab 12/08/22 1049 12/09/22 0036 12/10/22 0055  NA 134* 139 137  K 3.5 3.8 4.2  CL 95* 109 110  CO2 25 22 21*  GLUCOSE 453* 227* 177*  BUN 31* 22 16  CREATININE 1.16* 0.91 0.73  CALCIUM 10.1 8.7* 8.6*  PROT 8.5*  --   --   ALBUMIN 4.4  --   --   AST 34  --   --   ALT 21  --   --   ALKPHOS 63  --   --   BILITOT 1.1  --   --  GFRNONAA 49* >60 >60  ANIONGAP 14 8 6      Hematology Recent Labs  Lab 12/08/22 1049 12/09/22 0036 12/10/22 0055  WBC 9.5 9.7 11.4*  RBC 4.13 3.21* 3.16*  HGB 12.1 9.3* 9.2*  HCT 36.2 28.3* 28.8*  MCV 87.7 88.2 91.1  MCH 29.3 29.0 29.1  MCHC 33.4 32.9 31.9  RDW 13.2 13.4 13.5  PLT 314 245 213    BNPNo results for input(s): "BNP", "PROBNP" in the last 168 hours.   DDimer No results for input(s): "DDIMER" in the last 168 hours.   Radiology    ECHOCARDIOGRAM COMPLETE  Result Date: 12/09/2022    ECHOCARDIOGRAM REPORT   Patient Name:   Nancy Sutton Date of Exam: 12/09/2022 Medical Rec #:  [id number]      Height:       60.0 in Accession #:    [id number]     Weight:       121.3 lb Date of Birth:  [birth year]      BSA:          1.509 m Patient Age:    77 years       BP:           155/65 mmHg Patient Gender: F              HR:           101 bpm. Exam Location:  Inpatient  Procedure: Cardiac Doppler, Color Doppler and 2D Echo Indications:    Elevated Troponin  History:        Patient has no prior history of Echocardiogram examinations.                 Risk Factors:Hypertension and Diabetes.  Sonographer:    Marella Chimes Referring Phys: [place]  1. Left ventricular ejection fraction, by estimation, is 50 to 55%. The left ventricle has low normal function. The left ventricle has no regional wall motion abnormalities. There is mild concentric left ventricular hypertrophy. Left ventricular diastolic parameters are consistent with Grade II diastolic dysfunction (pseudonormalization).  2. Right ventricular systolic function is normal. The right ventricular size is normal. Tricuspid regurgitation signal is inadequate for assessing PA pressure.  3. The mitral valve is normal in structure. Mild mitral valve regurgitation. No evidence of mitral stenosis.  4. The aortic valve is normal in structure. Aortic valve regurgitation is not visualized. No aortic stenosis is present.  5. The inferior vena cava is dilated in size with <50% respiratory variability, suggesting right atrial pressure of 15 mmHg. FINDINGS  Left Ventricle: Left ventricular ejection fraction, by estimation, is 50 to 55%. The left ventricle has low normal function. The left ventricle has no regional wall motion abnormalities. The left ventricular internal cavity size was normal in size. There is mild concentric left ventricular hypertrophy. Left ventricular diastolic parameters are consistent with Grade II diastolic dysfunction (pseudonormalization). Right Ventricle: The right ventricular size is normal. No increase in right ventricular wall thickness. Right ventricular systolic function is normal. Tricuspid regurgitation signal is inadequate for assessing PA pressure. Left Atrium: Left atrial size was normal in size. Right Atrium: Right atrial size was normal in size. Pericardium: There is no  evidence of pericardial effusion. Mitral Valve: The mitral valve is normal in structure. Mild mitral valve regurgitation. No evidence of mitral valve stenosis. Tricuspid Valve: The tricuspid valve is normal in structure. Tricuspid valve regurgitation is not demonstrated. No evidence of tricuspid stenosis. Aortic Valve: The aortic valve  is normal in structure. Aortic valve regurgitation is not visualized. No aortic stenosis is present. Aortic valve mean gradient measures 6.0 mmHg. Aortic valve peak gradient measures 10.1 mmHg. Aortic valve area, by VTI measures 1.52 cm. Pulmonic Valve: The pulmonic valve was normal in structure. Pulmonic valve regurgitation is not visualized. No evidence of pulmonic stenosis. Aorta: The aortic root is normal in size and structure. Venous: The inferior vena cava is dilated in size with less than 50% respiratory variability, suggesting right atrial pressure of 15 mmHg. IAS/Shunts: No atrial level shunt detected by color flow Doppler.  LEFT VENTRICLE PLAX 2D LVIDd:         3.80 cm     Diastology LVIDs:         3.20 cm     LV e' medial:    6.64 cm/s LV PW:         0.90 cm     LV E/e' medial:  20.2 LV IVS:        0.90 cm     LV e' lateral:   12.20 cm/s LVOT diam:     1.80 cm     LV E/e' lateral: 11.0 LV SV:         53 LV SV Index:   35 LVOT Area:     2.54 cm  LV Volumes (MOD) LV vol d, MOD A4C: 66.5 ml LV vol s, MOD A4C: 31.7 ml LV SV MOD A4C:     66.5 ml RIGHT VENTRICLE             IVC RV S prime:     14.70 cm/s  IVC diam: 2.60 cm TAPSE (M-mode): 2.7 cm LEFT ATRIUM             Index        RIGHT ATRIUM          Index LA Vol (A2C):   48.5 ml 32.14 ml/m  RA Area:     9.21 cm LA Vol (A4C):   45.2 ml 29.95 ml/m  RA Volume:   17.40 ml 11.53 ml/m LA Biplane Vol: 49.0 ml 32.47 ml/m  AORTIC VALVE AV Area (Vmax):    1.33 cm AV Area (Vmean):   1.36 cm AV Area (VTI):     1.52 cm AV Vmax:           159.00 cm/s AV Vmean:          111.000 cm/s AV VTI:            0.351 m AV Peak Grad:       10.1 mmHg AV Mean Grad:      6.0 mmHg LVOT Vmax:         83.00 cm/s LVOT Vmean:        59.200 cm/s LVOT VTI:          0.210 m LVOT/AV VTI ratio: 0.60  AORTA Ao Root diam: 2.60 cm Ao Asc diam:  2.20 cm MITRAL VALVE MV Area (PHT): 4.29 cm     SHUNTS MV Decel Time: 177 msec     Systemic VTI:  0.21 m MV E velocity: 134.00 cm/s  Systemic Diam: 1.80 cm MV A velocity: 110.00 cm/s MV E/A ratio:  1.22 Kardie Tobb DO Electronically signed by Berniece Salines DO Signature Date/Time: 12/09/2022/1:54:35 PM    Final    CT ABDOMEN PELVIS W CONTRAST  Result Date: 12/08/2022 CLINICAL DATA:  Vomiting over the last 3 days. Bowel obstruction suspected. EXAM: CT ABDOMEN AND PELVIS  WITH CONTRAST TECHNIQUE: Multidetector CT imaging of the abdomen and pelvis was performed using the standard protocol following bolus administration of intravenous contrast. RADIATION DOSE REDUCTION: This exam was performed according to the departmental dose-optimization program which includes automated exposure control, adjustment of the mA and/or kV according to patient size and/or use of iterative reconstruction technique. CONTRAST:  133mL OMNIPAQUE IOHEXOL 300 MG/ML  SOLN COMPARISON:  None Available. FINDINGS: Lower chest: Mild scarring at the lung bases. No active process. Small hiatal hernia. Hepatobiliary: Few small calcified granulomas. Scattered small cysts. No worrisome liver lesion. The gallbladder contains multiple small stones, 1-4 mm in size, the size that would be at high risk of passing into the ductal system. However, there is no ductal dilatation or visible ductal stone. No CT evidence of cholecystitis. If concern persists, consider ultrasound or nuclear medicine scan. Pancreas: Normal Spleen: Normal Adrenals/Urinary Tract: Adrenal glands are normal. Kidneys are normal. No cyst, mass, stone or hydronephrosis. Bladder is full but normal. Stomach/Bowel: Small hiatal hernia as noted above. Stomach is otherwise normal. The small bowel is normal.  No obstruction or ileus. Normal appendix. Normal colon. Vascular/Lymphatic: Aortic atherosclerosis. No aneurysm. IVC is normal. No adenopathy. Reproductive: No pelvic mass.  Few small leiomyomas of the uterus. Other: No free fluid or air. Musculoskeletal: Ordinary spinal degenerative changes. IMPRESSION: 1. No acute finding to explain the clinical presentation. No evidence of bowel obstruction or ileus. 2. Small hiatal hernia. 3. Multiple small stones in the gallbladder, 1-4 mm in size, the size that would be at high risk of passing into the ductal system. However, there is no ductal dilatation or visible ductal stone. No CT evidence of cholecystitis. If concern persists, consider ultrasound or nuclear medicine scan. 4. Aortic atherosclerosis. 5. Few small leiomyomas of the uterus. Aortic Atherosclerosis (ICD10-I70.0). Electronically Signed   By: Nelson Chimes M.D.   On: 12/08/2022 15:40    Cardiac Studies   2D echo 12/09/22  1. Left ventricular ejection fraction, by estimation, is 50 to 55%. The  left ventricle has low normal function. The left ventricle has no regional  wall motion abnormalities. There is mild concentric left ventricular  hypertrophy. Left ventricular  diastolic parameters are consistent with Grade II diastolic dysfunction  (pseudonormalization).   2. Right ventricular systolic function is normal. The right ventricular  size is normal. Tricuspid regurgitation signal is inadequate for assessing  PA pressure.   3. The mitral valve is normal in structure. Mild mitral valve  regurgitation. No evidence of mitral stenosis.   4. The aortic valve is normal in structure. Aortic valve regurgitation is  not visualized. No aortic stenosis is present.   5. The inferior vena cava is dilated in size with <50% respiratory  variability, suggesting right atrial pressure of 15 mmHg.   Patient Profile     77 y.o. female with carotid artery disease by duplex 2016 (dimension not quantified in final  report, followed by PCP), DM2, stroke, arthritis, HTN, HLD, GERD, prior anemia on labs (10-11), Barrett's esophagus and hiatal hernia 06/2022 presented with several day history of nausea/vomiting since Monday 12/06/22. Initially thought she had food poisoning but symptoms persisted and no one else sick. Was unable to take her insulin as a result. Presented to [facility] ED with hyperglycemia glucose 453 and AKI with Cr 1.16, and transferred to [facility] for elevated troponin and abnormal EKG with ST depressions.   Assessment & Plan    1. Nausea - has recurred despite normalization of glucose levels - primary  team planning to intensify acid reduction regimen - given diabetic + ST changes, concern this could represent atypical angina, see #2  2. Elevated troponin - question demand ischemia vs NSTEMI - overall clinical picture concerning for cardiac ischemia though no chest pain - ? Atypical angina - remains on IV heparin - remains on PTA Plavix 75mg  daily - simvastatin changed to atorvastatin, will need f/u LFT/lipids as OP - 2D echo 12/09/22 EF 50-55% without WMA, mild LVH, G2DD, mild MR, dilated IVC - per d/w Dr. Harrell Gave, plan to pursue cath today given worsening symptoms and continued ST depression on EKG -> add ASA 324mg  ASA today then 81mg  daily tomorrow (Bromday reaction listed but pt confirms she's taken ASA in past years without trouble), add carvedilol - she consented pt for cath - nurse and IM also given update as well  3. Mild anemia, leukocytosis - Hgb 12.1>9.3>9.2 without bleeding, suspect initial value hemoconcentrated, remote values 10-11 - need to follow in setting of the above  4. HTN - BP remains poorly controlled  - on NTG ointment, we will add carvedilol 3.125mg  BID  5. AKI - resolved  6. DM with hyperglycemia - per medicine  7. GERD/Barrett's esophagus - was on omeprazole PTA - given Plavix use recommend PPI that will not interact with Plavix such as Protonix, Dexilant, or  Prevacid   For questions or updates, please contact [place] Please consult [email or web address] for contact info under Cardiology/STEMI.  Signed, Charlie Pitter, PA-C 12/10/2022, 10:39 AM

## 2022-12-10 NOTE — H&P (View-Only) (Signed)
Progress Note  Patient Name: Nancy Sutton Date of Encounter: 12/10/2022  Primary Cardiologist: Werner Lean, MD  Subjective   Had recurrence of nausea this AM around 9am. No vomiting but has not been able to eat anything this AM. No CP or chest burning. No SOB. Family concerned because she felt better yesterday but now nausea has recurred.  Inpatient Medications    Scheduled Meds:  atorvastatin  40 mg Oral Daily   calcium carbonate  1 tablet Oral BID WC   clopidogrel  75 mg Oral Daily   insulin aspart  0-9 Units Subcutaneous Q4H   insulin detemir  10 Units Subcutaneous QHS   latanoprost  1 drop Both Eyes QHS   nitroGLYCERIN  1 inch Topical Q6H   pantoprazole  40 mg Oral BID   timolol  1 drop Both Eyes BID   Continuous Infusions:  heparin 700 Units/hr (12/09/22 1816)   PRN Meds: acetaminophen **OR** acetaminophen, hydrALAZINE, metoCLOPramide (REGLAN) injection, polyethylene glycol   Vital Signs    Vitals:   12/09/22 1620 12/09/22 2018 12/10/22 0028 12/10/22 0820  BP: (!) 170/70 (!) 151/68 (!) 151/68 (!) 183/80  Pulse: 88 91 86 85  Resp: 17 17 17 18   Temp: 99.3 F (37.4 C) 98.8 F (37.1 C) 98.8 F (37.1 C) 98.8 F (37.1 C)  TempSrc: Oral Oral Oral Oral  SpO2: 99% 95% 95% 95%  Weight:      Height:        Intake/Output Summary (Last 24 hours) at 12/10/2022 1039 Last data filed at 12/10/2022 0031 Gross per 24 hour  Intake 105.44 ml  Output 300 ml  Net -194.56 ml      12/09/2022    4:16 AM 12/08/2022   10:33 AM 10/01/2022   10:48 AM  Last 3 Weights  Weight (lbs) 121 lb 4.8 oz 122 lb 123 lb 9.6 oz  Weight (kg) 55.021 kg 55.339 kg 56.065 kg     Telemetry    NSR, ST depression - Personally Reviewed  ECG   Repeat today showed NSR 87bpm, continued ST depression II, avF, V4-V6, occasional PACs, TWI III - Personally Reviewed  Physical Exam   GEN: No acute distress.  HEENT: Normocephalic, atraumatic, sclera non-icteric. Neck: No JVD or  bruits. Cardiac: RRR no murmurs, rubs, or gallops.  Respiratory: Clear to auscultation bilaterally. Breathing is unlabored. GI: Soft, nontender, non-distended, BS +x 4. MS: no deformity. Extremities: No clubbing or cyanosis. No edema. Distal pedal pulses are 2+ and equal bilaterally. Neuro:  AAOx3. Follows commands. Psych:  Responds to questions appropriately with a normal affect.  Labs    High Sensitivity Troponin:   Recent Labs  Lab 12/08/22 1049 12/08/22 1452 12/08/22 1942  TROPONINIHS 253* 823* 740*      Cardiac EnzymesNo results for input(s): "TROPONINI" in the last 168 hours. No results for input(s): "TROPIPOC" in the last 168 hours.   Chemistry Recent Labs  Lab 12/08/22 1049 12/09/22 0036 12/10/22 0055  NA 134* 139 137  K 3.5 3.8 4.2  CL 95* 109 110  CO2 25 22 21*  GLUCOSE 453* 227* 177*  BUN 31* 22 16  CREATININE 1.16* 0.91 0.73  CALCIUM 10.1 8.7* 8.6*  PROT 8.5*  --   --   ALBUMIN 4.4  --   --   AST 34  --   --   ALT 21  --   --   ALKPHOS 63  --   --   BILITOT 1.1  --   --  GFRNONAA 49* >60 >60  ANIONGAP 14 8 6      Hematology Recent Labs  Lab 12/08/22 1049 12/09/22 0036 12/10/22 0055  WBC 9.5 9.7 11.4*  RBC 4.13 3.21* 3.16*  HGB 12.1 9.3* 9.2*  HCT 36.2 28.3* 28.8*  MCV 87.7 88.2 91.1  MCH 29.3 29.0 29.1  MCHC 33.4 32.9 31.9  RDW 13.2 13.4 13.5  PLT 314 245 213    BNPNo results for input(s): "BNP", "PROBNP" in the last 168 hours.   DDimer No results for input(s): "DDIMER" in the last 168 hours.   Radiology    ECHOCARDIOGRAM COMPLETE  Result Date: 12/09/2022    ECHOCARDIOGRAM REPORT   Patient Name:   Nancy Sutton Date of Exam: 12/09/2022 Medical Rec #:  [id number]      Height:       60.0 in Accession #:    [id number]     Weight:       121.3 lb Date of Birth:  Oct 25, 1945      BSA:          1.509 m Patient Age:    77 years       BP:           155/65 mmHg Patient Gender: F              HR:           101 bpm. Exam Location:  Inpatient  Procedure: Cardiac Doppler, Color Doppler and 2D Echo Indications:    Elevated Troponin  History:        Patient has no prior history of Echocardiogram examinations.                 Risk Factors:Hypertension and Diabetes.  Sonographer:    Marella Chimes Referring Phys: [place]  1. Left ventricular ejection fraction, by estimation, is 50 to 55%. The left ventricle has low normal function. The left ventricle has no regional wall motion abnormalities. There is mild concentric left ventricular hypertrophy. Left ventricular diastolic parameters are consistent with Grade II diastolic dysfunction (pseudonormalization).  2. Right ventricular systolic function is normal. The right ventricular size is normal. Tricuspid regurgitation signal is inadequate for assessing PA pressure.  3. The mitral valve is normal in structure. Mild mitral valve regurgitation. No evidence of mitral stenosis.  4. The aortic valve is normal in structure. Aortic valve regurgitation is not visualized. No aortic stenosis is present.  5. The inferior vena cava is dilated in size with <50% respiratory variability, suggesting right atrial pressure of 15 mmHg. FINDINGS  Left Ventricle: Left ventricular ejection fraction, by estimation, is 50 to 55%. The left ventricle has low normal function. The left ventricle has no regional wall motion abnormalities. The left ventricular internal cavity size was normal in size. There is mild concentric left ventricular hypertrophy. Left ventricular diastolic parameters are consistent with Grade II diastolic dysfunction (pseudonormalization). Right Ventricle: The right ventricular size is normal. No increase in right ventricular wall thickness. Right ventricular systolic function is normal. Tricuspid regurgitation signal is inadequate for assessing PA pressure. Left Atrium: Left atrial size was normal in size. Right Atrium: Right atrial size was normal in size. Pericardium: There is no  evidence of pericardial effusion. Mitral Valve: The mitral valve is normal in structure. Mild mitral valve regurgitation. No evidence of mitral valve stenosis. Tricuspid Valve: The tricuspid valve is normal in structure. Tricuspid valve regurgitation is not demonstrated. No evidence of tricuspid stenosis. Aortic Valve: The aortic valve  is normal in structure. Aortic valve regurgitation is not visualized. No aortic stenosis is present. Aortic valve mean gradient measures 6.0 mmHg. Aortic valve peak gradient measures 10.1 mmHg. Aortic valve area, by VTI measures 1.52 cm. Pulmonic Valve: The pulmonic valve was normal in structure. Pulmonic valve regurgitation is not visualized. No evidence of pulmonic stenosis. Aorta: The aortic root is normal in size and structure. Venous: The inferior vena cava is dilated in size with less than 50% respiratory variability, suggesting right atrial pressure of 15 mmHg. IAS/Shunts: No atrial level shunt detected by color flow Doppler.  LEFT VENTRICLE PLAX 2D LVIDd:         3.80 cm     Diastology LVIDs:         3.20 cm     LV e' medial:    6.64 cm/s LV PW:         0.90 cm     LV E/e' medial:  20.2 LV IVS:        0.90 cm     LV e' lateral:   12.20 cm/s LVOT diam:     1.80 cm     LV E/e' lateral: 11.0 LV SV:         53 LV SV Index:   35 LVOT Area:     2.54 cm  LV Volumes (MOD) LV vol d, MOD A4C: 66.5 ml LV vol s, MOD A4C: 31.7 ml LV SV MOD A4C:     66.5 ml RIGHT VENTRICLE             IVC RV S prime:     14.70 cm/s  IVC diam: 2.60 cm TAPSE (M-mode): 2.7 cm LEFT ATRIUM             Index        RIGHT ATRIUM          Index LA Vol (A2C):   48.5 ml 32.14 ml/m  RA Area:     9.21 cm LA Vol (A4C):   45.2 ml 29.95 ml/m  RA Volume:   17.40 ml 11.53 ml/m LA Biplane Vol: 49.0 ml 32.47 ml/m  AORTIC VALVE AV Area (Vmax):    1.33 cm AV Area (Vmean):   1.36 cm AV Area (VTI):     1.52 cm AV Vmax:           159.00 cm/s AV Vmean:          111.000 cm/s AV VTI:            0.351 m AV Peak Grad:       10.1 mmHg AV Mean Grad:      6.0 mmHg LVOT Vmax:         83.00 cm/s LVOT Vmean:        59.200 cm/s LVOT VTI:          0.210 m LVOT/AV VTI ratio: 0.60  AORTA Ao Root diam: 2.60 cm Ao Asc diam:  2.20 cm MITRAL VALVE MV Area (PHT): 4.29 cm     SHUNTS MV Decel Time: 177 msec     Systemic VTI:  0.21 m MV E velocity: 134.00 cm/s  Systemic Diam: 1.80 cm MV A velocity: 110.00 cm/s MV E/A ratio:  1.22 Kardie Tobb DO Electronically signed by Berniece Salines DO Signature Date/Time: 12/09/2022/1:54:35 PM    Final    CT ABDOMEN PELVIS W CONTRAST  Result Date: 12/08/2022 CLINICAL DATA:  Vomiting over the last 3 days. Bowel obstruction suspected. EXAM: CT ABDOMEN AND PELVIS  WITH CONTRAST TECHNIQUE: Multidetector CT imaging of the abdomen and pelvis was performed using the standard protocol following bolus administration of intravenous contrast. RADIATION DOSE REDUCTION: This exam was performed according to the departmental dose-optimization program which includes automated exposure control, adjustment of the mA and/or kV according to patient size and/or use of iterative reconstruction technique. CONTRAST:  169mL OMNIPAQUE IOHEXOL 300 MG/ML  SOLN COMPARISON:  None Available. FINDINGS: Lower chest: Mild scarring at the lung bases. No active process. Small hiatal hernia. Hepatobiliary: Few small calcified granulomas. Scattered small cysts. No worrisome liver lesion. The gallbladder contains multiple small stones, 1-4 mm in size, the size that would be at high risk of passing into the ductal system. However, there is no ductal dilatation or visible ductal stone. No CT evidence of cholecystitis. If concern persists, consider ultrasound or nuclear medicine scan. Pancreas: Normal Spleen: Normal Adrenals/Urinary Tract: Adrenal glands are normal. Kidneys are normal. No cyst, mass, stone or hydronephrosis. Bladder is full but normal. Stomach/Bowel: Small hiatal hernia as noted above. Stomach is otherwise normal. The small bowel is normal.  No obstruction or ileus. Normal appendix. Normal colon. Vascular/Lymphatic: Aortic atherosclerosis. No aneurysm. IVC is normal. No adenopathy. Reproductive: No pelvic mass.  Few small leiomyomas of the uterus. Other: No free fluid or air. Musculoskeletal: Ordinary spinal degenerative changes. IMPRESSION: 1. No acute finding to explain the clinical presentation. No evidence of bowel obstruction or ileus. 2. Small hiatal hernia. 3. Multiple small stones in the gallbladder, 1-4 mm in size, the size that would be at high risk of passing into the ductal system. However, there is no ductal dilatation or visible ductal stone. No CT evidence of cholecystitis. If concern persists, consider ultrasound or nuclear medicine scan. 4. Aortic atherosclerosis. 5. Few small leiomyomas of the uterus. Aortic Atherosclerosis (ICD10-I70.0). Electronically Signed   By: Nelson Chimes M.D.   On: 12/08/2022 15:40    Cardiac Studies   2D echo 12/09/22  1. Left ventricular ejection fraction, by estimation, is 50 to 55%. The  left ventricle has low normal function. The left ventricle has no regional  wall motion abnormalities. There is mild concentric left ventricular  hypertrophy. Left ventricular  diastolic parameters are consistent with Grade II diastolic dysfunction  (pseudonormalization).   2. Right ventricular systolic function is normal. The right ventricular  size is normal. Tricuspid regurgitation signal is inadequate for assessing  PA pressure.   3. The mitral valve is normal in structure. Mild mitral valve  regurgitation. No evidence of mitral stenosis.   4. The aortic valve is normal in structure. Aortic valve regurgitation is  not visualized. No aortic stenosis is present.   5. The inferior vena cava is dilated in size with <50% respiratory  variability, suggesting right atrial pressure of 15 mmHg.   Patient Profile     77 y.o. female with carotid artery disease by duplex 2016 (dimension not quantified in final  report, followed by PCP), DM2, stroke, arthritis, HTN, HLD, GERD, prior anemia on labs (10-11), Barrett's esophagus and hiatal hernia 06/2022 presented with several day history of nausea/vomiting since Monday 12/06/22. Initially thought she had food poisoning but symptoms persisted and no one else sick. Was unable to take her insulin as a result. Presented to [facility] ED with hyperglycemia glucose 453 and AKI with Cr 1.16, and transferred to [facility] for elevated troponin and abnormal EKG with ST depressions.   Assessment & Plan    1. Nausea - has recurred despite normalization of glucose levels - primary  team planning to intensify acid reduction regimen - given diabetic + ST changes, concern this could represent atypical angina, see #2  2. Elevated troponin - question demand ischemia vs NSTEMI - overall clinical picture concerning for cardiac ischemia though no chest pain - ? Atypical angina - remains on IV heparin - remains on PTA Plavix 75mg  daily - simvastatin changed to atorvastatin, will need f/u LFT/lipids as OP - 2D echo 12/09/22 EF 50-55% without WMA, mild LVH, G2DD, mild MR, dilated IVC - per d/w Dr. Harrell Gave, plan to pursue cath today given worsening symptoms and continued ST depression on EKG -> add ASA 324mg  ASA today then 81mg  daily tomorrow (Bromday reaction listed but pt confirms she's taken ASA in past years without trouble), add carvedilol - she consented pt for cath - nurse and IM also given update as well  3. Mild anemia, leukocytosis - Hgb 12.1>9.3>9.2 without bleeding, suspect initial value hemoconcentrated, remote values 10-11 - need to follow in setting of the above  4. HTN - BP remains poorly controlled  - on NTG ointment, we will add carvedilol 3.125mg  BID  5. AKI - resolved  6. DM with hyperglycemia - per medicine  7. GERD/Barrett's esophagus - was on omeprazole PTA - given Plavix use recommend PPI that will not interact with Plavix such as Protonix, Dexilant, or  Prevacid   For questions or updates, please contact [place] Please consult [email or web address] for contact info under Cardiology/STEMI.  Signed, Charlie Pitter, PA-C 12/10/2022, 10:39 AM

## 2022-12-10 NOTE — Progress Notes (Signed)
PROGRESS NOTE    Nancy Sutton  [id number] DOB: May 08, 1946 DOA: 12/08/2022 PCP: Redmond School, MD    Brief Narrative:  77 year old with history of stroke with residual left hemianopsia, hypertension and type 2 diabetes presented to the emergency room with nausea and vomiting for 2 days, poor oral intake that started after eating out in the restaurant.  In the emergency room hemodynamically stable.  100% on room air.  CT scan abdomen pelvis without acute abnormality, shows multiple gallstones and gallbladder.  EKG showed T wave changes, troponins were 253-823.  Cardiology was consulted.  Admitted to [facility] as a transfer from [facility], started on heparin infusion. Patient does have history of Barrett's esophagus and gets surveillance endoscopy every year. For cardiac cath today.   Assessment & Plan:   Non-ST elevation MI: Presented with nausea vomiting, thought to be angina variant.  EKG with ischemic changes, troponins elevated.  Initially decided conservative management, however patient continues to have symptoms.  She is diabetic and this possibly represents atypical angina. Continue on Plavix, currently on heparin infusion, continued on high intensity statin. Nitroglycerin for for recurrent pain.  Morphine for recurrent pain.  Currently without chest pain. Maintenance IV fluids.  Echocardiogram with no regional wall motion abnormalities. Cardiology following, possible cardiac cath today  Persistent nausea with history of GERD and Barrett's esophagus: Symptomatology is likely due to #1.  Also possible GERD exacerbation.  PPI IV twice daily.  Tums 3 times daily today.  Continue maintenance IV fluids.  Type 2 diabetes with hyperglycemia: Uncontrolled.  Known A1c 7.2.  She had not taken insulin at home for last few days.  Resumed on insulin on reduced doses.  Holding metformin.  Continue sliding scale insulin.  Hydration.  Blood sugars better today.  Will adjust once she  is eating regular diet.  Essential hypertension: Blood pressure stable.  History of a stroke with residual left hemianopsia: Stable.  No new neurological deficit. Patient on Plavix, not on aspirin.  Continued.    DVT prophylaxis: Heparin infusion   Code Status: Full code Family Communication: Husband and daughter at bedside. Disposition Plan: Status is: Inpatient.  Heparin infusion.  Inpatient cardiac cath.   Consultants:  Cardiology  Procedures:  None  Antimicrobials:  None   Subjective:  Patient seen and examined.  Husband and daughter at the bedside.  She did okay last night and since today morning she started having same kind of nausea.  Denies any chest pain shortness of breath or palpitations.  Denies any abdominal pain.  EKG with nonspecific T wave depressions. Case discussed with cardiology.  She will benefit with cardiac catheterization.  Objective: Vitals:   12/10/22 0028 12/10/22 0820 12/10/22 1107 12/10/22 1116  BP: (!) 151/68 (!) 183/80 (!) 177/68   Pulse: 86 85    Resp: 17 18    Temp: 98.8 F (37.1 C) 98.8 F (37.1 C)    TempSrc: Oral Oral    SpO2: 95% 95%    Weight:    57.1 kg  Height:        Intake/Output Summary (Last 24 hours) at 12/10/2022 1150 Last data filed at 12/10/2022 0031 Gross per 24 hour  Intake 105.44 ml  Output 300 ml  Net -194.56 ml    Filed Weights   12/08/22 1033 12/09/22 0416 12/10/22 1116  Weight: 55.3 kg 55 kg 57.1 kg    Examination:  General exam: Appears anxious today.  Frail. Respiratory system: No added sounds. Cardiovascular system: S1 &  S2 heard, RRR. No pedal edema. Gastrointestinal system: Soft.  Nontender.  Bowel sound present.   Central nervous system: Alert and oriented. No focal neurological deficits. Extremities: Symmetric 5 x 5 power. Skin: No rashes, lesions or ulcers Psychiatry: Judgement and insight appear normal.  Anxious mood.    Data Reviewed: I have personally reviewed following labs and  imaging studies  CBC: Recent Labs  Lab 12/08/22 1049 12/09/22 0036 12/10/22 0055  WBC 9.5 9.7 11.4*  NEUTROABS 8.0*  --   --   HGB 12.1 9.3* 9.2*  HCT 36.2 28.3* 28.8*  MCV 87.7 88.2 91.1  PLT 314 245 [id number]    Basic Metabolic Panel: Recent Labs  Lab 12/08/22 1049 12/09/22 0036 12/10/22 0055  NA 134* 139 137  K 3.5 3.8 4.2  CL 95* 109 110  CO2 25 22 21*  GLUCOSE 453* 227* 177*  BUN 31* 22 16  CREATININE 1.16* 0.91 0.73  CALCIUM 10.1 8.7* 8.6*    GFR: Estimated Creatinine Clearance: 46.6 mL/min (by C-G formula based on SCr of 0.73 mg/dL). Liver Function Tests: Recent Labs  Lab 12/08/22 1049  AST 34  ALT 21  ALKPHOS 63  BILITOT 1.1  PROT 8.5*  ALBUMIN 4.4    Recent Labs  Lab 12/08/22 1049  LIPASE 27    No results for input(s): "AMMONIA" in the last 168 hours. Coagulation Profile: No results for input(s): "INR", "PROTIME" in the last 168 hours. Cardiac Enzymes: No results for input(s): "CKTOTAL", "CKMB", "CKMBINDEX", "TROPONINI" in the last 168 hours. BNP (last 3 results) No results for input(s): "PROBNP" in the last 8760 hours. HbA1C: No results for input(s): "HGBA1C" in the last 72 hours. CBG: Recent Labs  Lab 12/09/22 2015 12/10/22 0026 12/10/22 0430 12/10/22 0824 12/10/22 1126  GLUCAP 211* 156* 143* 126* 138*    Lipid Profile: No results for input(s): "CHOL", "HDL", "LDLCALC", "TRIG", "CHOLHDL", "LDLDIRECT" in the last 72 hours. Thyroid Function Tests: No results for input(s): "TSH", "T4TOTAL", "FREET4", "T3FREE", "THYROIDAB" in the last 72 hours. Anemia Panel: No results for input(s): "VITAMINB12", "FOLATE", "FERRITIN", "TIBC", "IRON", "RETICCTPCT" in the last 72 hours. Sepsis Labs: No results for input(s): "PROCALCITON", "LATICACIDVEN" in the last 168 hours.  No results found for this or any previous visit (from the past 240 hour(s)).       Radiology Studies: ECHOCARDIOGRAM COMPLETE  Result Date: 12/09/2022    ECHOCARDIOGRAM  REPORT   Patient Name:   Nancy Sutton Date of Exam: 12/09/2022 Medical Rec #:  [id number]      Height:       60.0 in Accession #:    [id number]     Weight:       121.3 lb Date of Birth:  1946/06/08      BSA:          1.509 m Patient Age:    57 years       BP:           155/65 mmHg Patient Gender: F              HR:           101 bpm. Exam Location:  Inpatient Procedure: Cardiac Doppler, Color Doppler and 2D Echo Indications:    Elevated Troponin  History:        Patient has no prior history of Echocardiogram examinations.                 Risk Factors:Hypertension and Diabetes.  Sonographer:  Marella Chimes Referring Phys: [place]  1. Left ventricular ejection fraction, by estimation, is 50 to 55%. The left ventricle has low normal function. The left ventricle has no regional wall motion abnormalities. There is mild concentric left ventricular hypertrophy. Left ventricular diastolic parameters are consistent with Grade II diastolic dysfunction (pseudonormalization).  2. Right ventricular systolic function is normal. The right ventricular size is normal. Tricuspid regurgitation signal is inadequate for assessing PA pressure.  3. The mitral valve is normal in structure. Mild mitral valve regurgitation. No evidence of mitral stenosis.  4. The aortic valve is normal in structure. Aortic valve regurgitation is not visualized. No aortic stenosis is present.  5. The inferior vena cava is dilated in size with <50% respiratory variability, suggesting right atrial pressure of 15 mmHg. FINDINGS  Left Ventricle: Left ventricular ejection fraction, by estimation, is 50 to 55%. The left ventricle has low normal function. The left ventricle has no regional wall motion abnormalities. The left ventricular internal cavity size was normal in size. There is mild concentric left ventricular hypertrophy. Left ventricular diastolic parameters are consistent with Grade II diastolic dysfunction  (pseudonormalization). Right Ventricle: The right ventricular size is normal. No increase in right ventricular wall thickness. Right ventricular systolic function is normal. Tricuspid regurgitation signal is inadequate for assessing PA pressure. Left Atrium: Left atrial size was normal in size. Right Atrium: Right atrial size was normal in size. Pericardium: There is no evidence of pericardial effusion. Mitral Valve: The mitral valve is normal in structure. Mild mitral valve regurgitation. No evidence of mitral valve stenosis. Tricuspid Valve: The tricuspid valve is normal in structure. Tricuspid valve regurgitation is not demonstrated. No evidence of tricuspid stenosis. Aortic Valve: The aortic valve is normal in structure. Aortic valve regurgitation is not visualized. No aortic stenosis is present. Aortic valve mean gradient measures 6.0 mmHg. Aortic valve peak gradient measures 10.1 mmHg. Aortic valve area, by VTI measures 1.52 cm. Pulmonic Valve: The pulmonic valve was normal in structure. Pulmonic valve regurgitation is not visualized. No evidence of pulmonic stenosis. Aorta: The aortic root is normal in size and structure. Venous: The inferior vena cava is dilated in size with less than 50% respiratory variability, suggesting right atrial pressure of 15 mmHg. IAS/Shunts: No atrial level shunt detected by color flow Doppler.  LEFT VENTRICLE PLAX 2D LVIDd:         3.80 cm     Diastology LVIDs:         3.20 cm     LV e' medial:    6.64 cm/s LV PW:         0.90 cm     LV E/e' medial:  20.2 LV IVS:        0.90 cm     LV e' lateral:   12.20 cm/s LVOT diam:     1.80 cm     LV E/e' lateral: 11.0 LV SV:         53 LV SV Index:   35 LVOT Area:     2.54 cm  LV Volumes (MOD) LV vol d, MOD A4C: 66.5 ml LV vol s, MOD A4C: 31.7 ml LV SV MOD A4C:     66.5 ml RIGHT VENTRICLE             IVC RV S prime:     14.70 cm/s  IVC diam: 2.60 cm TAPSE (M-mode): 2.7 cm LEFT ATRIUM  Index        RIGHT ATRIUM          Index LA  Vol (A2C):   48.5 ml 32.14 ml/m  RA Area:     9.21 cm LA Vol (A4C):   45.2 ml 29.95 ml/m  RA Volume:   17.40 ml 11.53 ml/m LA Biplane Vol: 49.0 ml 32.47 ml/m  AORTIC VALVE AV Area (Vmax):    1.33 cm AV Area (Vmean):   1.36 cm AV Area (VTI):     1.52 cm AV Vmax:           159.00 cm/s AV Vmean:          111.000 cm/s AV VTI:            0.351 m AV Peak Grad:      10.1 mmHg AV Mean Grad:      6.0 mmHg LVOT Vmax:         83.00 cm/s LVOT Vmean:        59.200 cm/s LVOT VTI:          0.210 m LVOT/AV VTI ratio: 0.60  AORTA Ao Root diam: 2.60 cm Ao Asc diam:  2.20 cm MITRAL VALVE MV Area (PHT): 4.29 cm     SHUNTS MV Decel Time: 177 msec     Systemic VTI:  0.21 m MV E velocity: 134.00 cm/s  Systemic Diam: 1.80 cm MV A velocity: 110.00 cm/s MV E/A ratio:  1.22 Kardie Tobb DO Electronically signed by Berniece Salines DO Signature Date/Time: 12/09/2022/1:54:35 PM    Final    CT ABDOMEN PELVIS W CONTRAST  Result Date: 12/08/2022 CLINICAL DATA:  Vomiting over the last 3 days. Bowel obstruction suspected. EXAM: CT ABDOMEN AND PELVIS WITH CONTRAST TECHNIQUE: Multidetector CT imaging of the abdomen and pelvis was performed using the standard protocol following bolus administration of intravenous contrast. RADIATION DOSE REDUCTION: This exam was performed according to the departmental dose-optimization program which includes automated exposure control, adjustment of the mA and/or kV according to patient size and/or use of iterative reconstruction technique. CONTRAST:  17mL OMNIPAQUE IOHEXOL 300 MG/ML  SOLN COMPARISON:  None Available. FINDINGS: Lower chest: Mild scarring at the lung bases. No active process. Small hiatal hernia. Hepatobiliary: Few small calcified granulomas. Scattered small cysts. No worrisome liver lesion. The gallbladder contains multiple small stones, 1-4 mm in size, the size that would be at high risk of passing into the ductal system. However, there is no ductal dilatation or visible ductal stone. No CT  evidence of cholecystitis. If concern persists, consider ultrasound or nuclear medicine scan. Pancreas: Normal Spleen: Normal Adrenals/Urinary Tract: Adrenal glands are normal. Kidneys are normal. No cyst, mass, stone or hydronephrosis. Bladder is full but normal. Stomach/Bowel: Small hiatal hernia as noted above. Stomach is otherwise normal. The small bowel is normal. No obstruction or ileus. Normal appendix. Normal colon. Vascular/Lymphatic: Aortic atherosclerosis. No aneurysm. IVC is normal. No adenopathy. Reproductive: No pelvic mass.  Few small leiomyomas of the uterus. Other: No free fluid or air. Musculoskeletal: Ordinary spinal degenerative changes. IMPRESSION: 1. No acute finding to explain the clinical presentation. No evidence of bowel obstruction or ileus. 2. Small hiatal hernia. 3. Multiple small stones in the gallbladder, 1-4 mm in size, the size that would be at high risk of passing into the ductal system. However, there is no ductal dilatation or visible ductal stone. No CT evidence of cholecystitis. If concern persists, consider ultrasound or nuclear medicine scan. 4. Aortic atherosclerosis. 5. Few small  leiomyomas of the uterus. Aortic Atherosclerosis (ICD10-I70.0). Electronically Signed   By: Nelson Chimes M.D.   On: 12/08/2022 15:40        Scheduled Meds:  aspirin  324 mg Oral Once   [START ON 12/11/2022] aspirin EC  81 mg Oral Daily   atorvastatin  40 mg Oral Daily   calcium carbonate  1 tablet Oral TID   carvedilol  3.125 mg Oral BID WC   clopidogrel  75 mg Oral Daily   insulin aspart  0-9 Units Subcutaneous Q4H   insulin detemir  10 Units Subcutaneous QHS   latanoprost  1 drop Both Eyes QHS   nitroGLYCERIN  1 inch Topical Q6H   pantoprazole (PROTONIX) IV  40 mg Intravenous Q12H   sodium chloride flush  3 mL Intravenous Q12H   timolol  1 drop Both Eyes BID   Continuous Infusions:  sodium chloride     sodium chloride     sodium chloride     Followed by   sodium chloride      heparin 700 Units/hr (12/09/22 1816)     LOS: 1 day    Time spent: 35 minutes    Barb Merino, MD [facility] Pager [phone]

## 2022-12-10 NOTE — Interval H&P Note (Signed)
History and Physical Interval Note:  12/10/2022 12:48 PM  Nancy Sutton  has presented today for surgery, with the diagnosis of nstemi.  The various methods of treatment have been discussed with the patient and family. After consideration of risks, benefits and other options for treatment, the patient has consented to  Procedure(s): LEFT HEART CATH AND CORONARY ANGIOGRAPHY (N/A) as a surgical intervention.  The patient's history has been reviewed, patient examined, no change in status, stable for surgery.  I have reviewed the patient's chart and labs.  Questions were answered to the patient's satisfaction.     Sherren Mocha

## 2022-12-11 ENCOUNTER — Encounter (HOSPITAL_COMMUNITY): Payer: Self-pay | Admitting: Internal Medicine

## 2022-12-11 DIAGNOSIS — I214 Non-ST elevation (NSTEMI) myocardial infarction: Secondary | ICD-10-CM | POA: Diagnosis not present

## 2022-12-11 DIAGNOSIS — I480 Paroxysmal atrial fibrillation: Secondary | ICD-10-CM | POA: Insufficient documentation

## 2022-12-11 DIAGNOSIS — I4891 Unspecified atrial fibrillation: Secondary | ICD-10-CM | POA: Insufficient documentation

## 2022-12-11 LAB — BASIC METABOLIC PANEL
Anion gap: 7 (ref 5–15)
BUN: 16 mg/dL (ref 8–23)
CO2: 21 mmol/L — ABNORMAL LOW (ref 22–32)
Calcium: 8.4 mg/dL — ABNORMAL LOW (ref 8.9–10.3)
Chloride: 106 mmol/L (ref 98–111)
Creatinine, Ser: 0.75 mg/dL (ref 0.44–1.00)
GFR, Estimated: >60 mL/min (ref >60)
Glucose, Bld: 183 mg/dL — ABNORMAL HIGH (ref 70–99)
Potassium: 3.2 mmol/L — ABNORMAL LOW (ref 3.5–5.1)
Sodium: 134 mmol/L — ABNORMAL LOW (ref 135–145)

## 2022-12-11 LAB — CBC
HCT: 28 % — ABNORMAL LOW (ref 36.0–46.0)
Hemoglobin: 9.5 g/dL — ABNORMAL LOW (ref 12.0–15.0)
MCH: 29.6 pg (ref 26.0–34.0)
MCHC: 33.9 g/dL (ref 30.0–36.0)
MCV: 87.2 fL (ref 80.0–100.0)
Platelets: 174 10*3/uL (ref 150–400)
RBC: 3.21 MIL/uL — ABNORMAL LOW (ref 3.87–5.11)
RDW: 13.2 % (ref 11.5–15.5)
WBC: 8.2 10*3/uL (ref 4.0–10.5)
nRBC: 0 % (ref 0.0–0.2)

## 2022-12-11 LAB — GLUCOSE, CAPILLARY
Glucose-Capillary: 142 mg/dL — ABNORMAL HIGH (ref 70–99)
Glucose-Capillary: 163 mg/dL — ABNORMAL HIGH (ref 70–99)
Glucose-Capillary: 200 mg/dL — ABNORMAL HIGH (ref 70–99)

## 2022-12-11 MED ORDER — CARVEDILOL 6.25 MG PO TABS: 6.2500 mg | ORAL_TABLET | Freq: Two times a day (BID) | ORAL | 1 refills | Status: DC

## 2022-12-11 MED ORDER — CARVEDILOL 3.125 MG PO TABS: 3.1250 mg | ORAL_TABLET | Freq: Once | ORAL | Status: DC

## 2022-12-11 MED ORDER — CARVEDILOL 6.25 MG PO TABS: 6.2500 mg | ORAL_TABLET | Freq: Two times a day (BID) | ORAL | Status: DC

## 2022-12-11 MED ORDER — ATORVASTATIN CALCIUM 40 MG PO TABS: 40.0000 mg | ORAL_TABLET | Freq: Every day | ORAL | 1 refills | Status: DC

## 2022-12-11 MED ORDER — CLOPIDOGREL BISULFATE 75 MG PO TABS: 75.0000 mg | ORAL_TABLET | Freq: Every day | ORAL | 11 refills | Status: AC

## 2022-12-11 MED ORDER — CARVEDILOL 3.125 MG PO TABS
3.1250 mg | ORAL_TABLET | Freq: Once | ORAL | Status: AC
  Administered 2022-12-11: 3.125 mg via ORAL
  Filled 2022-12-11: qty 1

## 2022-12-11 MED ORDER — INSULIN ASPART 100 UNIT/ML IJ SOLN
0.0000 [IU] | Freq: Three times a day (TID) | INTRAMUSCULAR | Status: DC
  Administered 2022-12-11: 1 [IU] via SUBCUTANEOUS
  Administered 2022-12-11 (×2): 2 [IU] via SUBCUTANEOUS

## 2022-12-11 MED ORDER — POTASSIUM CHLORIDE CRYS ER 20 MEQ PO TBCR
40.0000 meq | EXTENDED_RELEASE_TABLET | Freq: Once | ORAL | Status: AC
  Administered 2022-12-11: 40 meq via ORAL
  Filled 2022-12-11: qty 2

## 2022-12-11 MED ORDER — PANTOPRAZOLE SODIUM 40 MG PO TBEC
40.0000 mg | DELAYED_RELEASE_TABLET | Freq: Two times a day (BID) | ORAL | Status: DC
  Administered 2022-12-11: 40 mg via ORAL
  Filled 2022-12-11: qty 1

## 2022-12-11 MED ORDER — CARVEDILOL 6.25 MG PO TABS
6.2500 mg | ORAL_TABLET | Freq: Two times a day (BID) | ORAL | Status: DC
  Administered 2022-12-11: 6.25 mg via ORAL
  Filled 2022-12-11: qty 1

## 2022-12-11 MED ORDER — ASPIRIN 81 MG PO TBEC: 81.0000 mg | DELAYED_RELEASE_TABLET | Freq: Every day | ORAL | 12 refills | Status: DC

## 2022-12-11 NOTE — Progress Notes (Signed)
CARDIAC REHAB PHASE I   PRE:  Rate/Rhythm: 93   BP:  Stand: 144/65     SaO2: 97 ra  MODE:  Ambulation: 470 ft   POST:  Rate/Rhythm: 108  BP:  Sitting: 158/90      SaO2: 97 ra  Pt tolerated exercise well and AMB 470 ft with no assistive device, and standby assist. Pt had no rest break, chest pain, SOB or pain. Education given to pt on heart healthy diet, radial/femoral weight restrictions, MI booklet, adherence to NTG, Brilinta and ASA.  Home exercise guidelines given and will refer to cardiac rehab phase 2 at [facility]. Pt left in the bed with call bell in reach. All questions were answered and pt verbalized understanding.  Kirby Funk ACSM-CEP 12/11/2022 8:45 AM

## 2022-12-11 NOTE — Progress Notes (Signed)
MEWS Progress Note  Patient Details Name: Nancy Sutton MRN: [id number] DOB: 12/24/1945 Today's Date: 12/11/2022   MEWS Flowsheet Documentation:  Assess: MEWS Score Temp: 98.7 F (37.1 C) BP: (!) 124/55 MAP (mmHg): 75 Pulse Rate: (!) 117 ECG Heart Rate: (!) 117 Resp: 18 Level of Consciousness: Alert SpO2: 99 % O2 Device: Room Air Patient Activity (if Appropriate): In bed O2 Flow Rate (L/min): 2 L/min Assess: MEWS Score MEWS Temp: 0 MEWS Systolic: 0 MEWS Pulse: 2 MEWS RR: 0 MEWS LOC: 0 MEWS Score: 2 MEWS Score Color: Yellow Assess: SIRS CRITERIA SIRS Temperature : 0 SIRS Respirations : 0 SIRS Pulse: 1 SIRS WBC: 0 SIRS Score Sum : 1 SIRS Temperature : 0 SIRS Pulse: 0 SIRS Respirations : 0 SIRS WBC: 0 SIRS Score Sum : 0 Assess: if the MEWS score is Yellow or Red Were vital signs taken at a resting state?: Yes Focused Assessment: Change from prior assessment (see assessment flowsheet) (new onset afib RVR) Does the patient meet 2 or more of the SIRS criteria?: No MEWS guidelines implemented : Yes, yellow Treat MEWS Interventions: Considered administering scheduled or prn medications/treatments as ordered Take Vital Signs Increase Vital Sign Frequency : Yellow: Q2hr x1, continue Q4hrs until patient remains green for 12hrs Escalate MEWS: Escalate: Yellow: Discuss with charge nurse and consider notifying provider and/or RRT Notify: Charge Nurse/RN Name of Charge Nurse/RN Notified: Nancy Sutton Provider Notification Date Provider Notified: 12/08/22 Time Provider Notified: [id number] Test performed and critical result: Troponin 253 Date Critical Result Received: 12/08/22 Time Critical Result Received: 1302 Provider response: In department Date of Provider Response: 12/08/22 Time of Provider Response: 1302      Nancy Sutton 12/11/2022, 2:18 PM

## 2022-12-11 NOTE — Progress Notes (Signed)
Patient converted to NSR with HR in the 80s. I notified Dr. Maylene Roes.

## 2022-12-11 NOTE — Progress Notes (Signed)
[facility] night cross cover note:   I was contacted by RN regarding clarification of this patient's orders for CBG monitoring and associated sliding scale insulin coverage.  This patient, who was previously n.p.o. for left-sided heart catheterization and that occurred earlier today, now has a regular diet and is tolerating p.o.  I subsequently updated CBG monitoring and sign scale coverage to reflect before every meal and at bedtime coverage with low-dose sliding scale insulin.    Babs Bertin, DO Hospitalist

## 2022-12-11 NOTE — Progress Notes (Signed)
Rounding Note    Patient Name: KESHANA MELVIN Date of Encounter: 12/11/2022  [facility] Cardiologist: Werner Lean, MD ([place])  Subjective   Doing much better post intervention No nausea   Inpatient Medications    Scheduled Meds:  aspirin EC  81 mg Oral Daily   atorvastatin  40 mg Oral Daily   calcium carbonate  1 tablet Oral TID   carvedilol  3.125 mg Oral BID WC   clopidogrel  75 mg Oral Daily   insulin aspart  0-9 Units Subcutaneous TID WC   insulin detemir  10 Units Subcutaneous QHS   latanoprost  1 drop Both Eyes QHS   pantoprazole (PROTONIX) IV  40 mg Intravenous Q12H   potassium chloride  40 mEq Oral Once   sodium chloride flush  3 mL Intravenous Q12H   sodium chloride flush  3 mL Intravenous Q12H   timolol  1 drop Both Eyes BID   Continuous Infusions:  sodium chloride     PRN Meds: sodium chloride, acetaminophen **OR** acetaminophen, hydrALAZINE, metoCLOPramide (REGLAN) injection, ondansetron (ZOFRAN) IV, polyethylene glycol, sodium chloride flush   Vital Signs    Vitals:   12/10/22 2051 12/10/22 2110 12/10/22 2206 12/11/22 0341  BP: (!) 164/64 (!) 150/60 (!) 139/54 (!) 159/53  Pulse: 93 96 96 89  Resp:    17  Temp:    98.7 F (37.1 C)  TempSrc:    Oral  SpO2:  96% 95% 96%  Weight:      Height:        Intake/Output Summary (Last 24 hours) at 12/11/2022 0807 Last data filed at 12/10/2022 1500 Gross per 24 hour  Intake 158.61 ml  Output --  Net 158.61 ml      12/10/2022   11:16 AM 12/09/2022    4:16 AM 12/08/2022   10:33 AM  Last 3 Weights  Weight (lbs) 125 lb 12.7 oz 121 lb 4.8 oz 122 lb  Weight (kg) 57.06 kg 55.021 kg 55.339 kg      Telemetry    SR - Personally Reviewed  ECG    SR with ST/T wave changes inferior, st depressions lateral - Personally Reviewed  Physical Exam   Right radial mild forearm burising Lungs clear  No murmur Abdomen benign  Trace edema Palpable pedal pulses   Labs    High  Sensitivity Troponin:   Recent Labs  Lab 12/08/22 1049 12/08/22 1452 12/08/22 1942  TROPONINIHS 253* 823* 740*     Chemistry Recent Labs  Lab 12/08/22 1049 12/09/22 0036 12/10/22 0055 12/11/22 0114  NA 134* 139 137 134*  K 3.5 3.8 4.2 3.2*  CL 95* 109 110 106  CO2 25 22 21* 21*  GLUCOSE 453* 227* 177* 183*  BUN 31* 22 16 16   CREATININE 1.16* 0.91 0.73 0.75  CALCIUM 10.1 8.7* 8.6* 8.4*  PROT 8.5*  --   --   --   ALBUMIN 4.4  --   --   --   AST 34  --   --   --   ALT 21  --   --   --   ALKPHOS 63  --   --   --   BILITOT 1.1  --   --   --   GFRNONAA 49* >60 >60 >60  ANIONGAP 14 8 6 7     Lipids No results for input(s): "CHOL", "TRIG", "HDL", "LABVLDL", "LDLCALC", "CHOLHDL" in the last 168 hours.  Hematology Recent Labs  Lab 12/09/22  0036 12/10/22 0055 12/11/22 0114  WBC 9.7 11.4* 8.2  RBC 3.21* 3.16* 3.21*  HGB 9.3* 9.2* 9.5*  HCT 28.3* 28.8* 28.0*  MCV 88.2 91.1 87.2  MCH 29.0 29.1 29.6  MCHC 32.9 31.9 33.9  RDW 13.4 13.5 13.2  PLT 245 213 174   Thyroid No results for input(s): "TSH", "FREET4" in the last 168 hours.  BNPNo results for input(s): "BNP", "PROBNP" in the last 168 hours.  DDimer No results for input(s): "DDIMER" in the last 168 hours.   Radiology    CARDIAC CATHETERIZATION  Result Date: 12/10/2022   Ost RCA to Prox RCA lesion is 95% stenosed.   Mid RCA lesion is 90% stenosed.   Mid Cx to Dist Cx lesion is 70% stenosed.   Dist LAD lesion is 70% stenosed.   Mid LAD lesion is 40% stenosed.   A drug-eluting stent was successfully placed using a SYNERGY XD 3.0X16.   A drug-eluting stent was successfully placed using a SYNERGY XD 3.0X20.   Post intervention, there is a 0% residual stenosis.   Post intervention, there is a 0% residual stenosis. 1.  Severe ostial and mid RCA stenoses, both treated with PCI (3.0 x 16 mm Synergy DES for the ostial lesion and 3.0 x 20 mm Synergy DES for the mid lesion). 2.  Widely patent left main with no stenosis 3.  Patent LAD  with mild diffuse plaquing and moderately severe mid to distal vessel stenosis appropriate for medical therapy 4.  Patent left circumflex with moderate diffuse mid vessel stenosis, appropriate for medical therapy 5.  Normal LVEDP Recommendations: DAPT with aspirin and clopidogrel through 12 months, then resume clopidogrel monotherapy.  Aggressive medical therapy for risk reduction.  Patient eligible for hospital discharge tomorrow if no complications arise.   ECHOCARDIOGRAM COMPLETE  Result Date: 12/09/2022    ECHOCARDIOGRAM REPORT   Patient Name:   SANIAH MANALAC Date of Exam: 12/09/2022 Medical Rec #:  [id number]      Height:       60.0 in Accession #:    [id number]     Weight:       121.3 lb Date of Birth:  1945-10-30      BSA:          1.509 m Patient Age:    72 years       BP:           155/65 mmHg Patient Gender: F              HR:           101 bpm. Exam Location:  Inpatient Procedure: Cardiac Doppler, Color Doppler and 2D Echo Indications:    Elevated Troponin  History:        Patient has no prior history of Echocardiogram examinations.                 Risk Factors:Hypertension and Diabetes.  Sonographer:    Marella Chimes Referring Phys: [place]  1. Left ventricular ejection fraction, by estimation, is 50 to 55%. The left ventricle has low normal function. The left ventricle has no regional wall motion abnormalities. There is mild concentric left ventricular hypertrophy. Left ventricular diastolic parameters are consistent with Grade II diastolic dysfunction (pseudonormalization).  2. Right ventricular systolic function is normal. The right ventricular size is normal. Tricuspid regurgitation signal is inadequate for assessing PA pressure.  3. The mitral valve is normal in structure. Mild mitral valve regurgitation. No evidence  of mitral stenosis.  4. The aortic valve is normal in structure. Aortic valve regurgitation is not visualized. No aortic stenosis is present.  5. The  inferior vena cava is dilated in size with <50% respiratory variability, suggesting right atrial pressure of 15 mmHg. FINDINGS  Left Ventricle: Left ventricular ejection fraction, by estimation, is 50 to 55%. The left ventricle has low normal function. The left ventricle has no regional wall motion abnormalities. The left ventricular internal cavity size was normal in size. There is mild concentric left ventricular hypertrophy. Left ventricular diastolic parameters are consistent with Grade II diastolic dysfunction (pseudonormalization). Right Ventricle: The right ventricular size is normal. No increase in right ventricular wall thickness. Right ventricular systolic function is normal. Tricuspid regurgitation signal is inadequate for assessing PA pressure. Left Atrium: Left atrial size was normal in size. Right Atrium: Right atrial size was normal in size. Pericardium: There is no evidence of pericardial effusion. Mitral Valve: The mitral valve is normal in structure. Mild mitral valve regurgitation. No evidence of mitral valve stenosis. Tricuspid Valve: The tricuspid valve is normal in structure. Tricuspid valve regurgitation is not demonstrated. No evidence of tricuspid stenosis. Aortic Valve: The aortic valve is normal in structure. Aortic valve regurgitation is not visualized. No aortic stenosis is present. Aortic valve mean gradient measures 6.0 mmHg. Aortic valve peak gradient measures 10.1 mmHg. Aortic valve area, by VTI measures 1.52 cm. Pulmonic Valve: The pulmonic valve was normal in structure. Pulmonic valve regurgitation is not visualized. No evidence of pulmonic stenosis. Aorta: The aortic root is normal in size and structure. Venous: The inferior vena cava is dilated in size with less than 50% respiratory variability, suggesting right atrial pressure of 15 mmHg. IAS/Shunts: No atrial level shunt detected by color flow Doppler.  LEFT VENTRICLE PLAX 2D LVIDd:         3.80 cm     Diastology LVIDs:          3.20 cm     LV e' medial:    6.64 cm/s LV PW:         0.90 cm     LV E/e' medial:  20.2 LV IVS:        0.90 cm     LV e' lateral:   12.20 cm/s LVOT diam:     1.80 cm     LV E/e' lateral: 11.0 LV SV:         53 LV SV Index:   35 LVOT Area:     2.54 cm  LV Volumes (MOD) LV vol d, MOD A4C: 66.5 ml LV vol s, MOD A4C: 31.7 ml LV SV MOD A4C:     66.5 ml RIGHT VENTRICLE             IVC RV S prime:     14.70 cm/s  IVC diam: 2.60 cm TAPSE (M-mode): 2.7 cm LEFT ATRIUM             Index        RIGHT ATRIUM          Index LA Vol (A2C):   48.5 ml 32.14 ml/m  RA Area:     9.21 cm LA Vol (A4C):   45.2 ml 29.95 ml/m  RA Volume:   17.40 ml 11.53 ml/m LA Biplane Vol: 49.0 ml 32.47 ml/m  AORTIC VALVE AV Area (Vmax):    1.33 cm AV Area (Vmean):   1.36 cm AV Area (VTI):     1.52 cm AV Vmax:  159.00 cm/s AV Vmean:          111.000 cm/s AV VTI:            0.351 m AV Peak Grad:      10.1 mmHg AV Mean Grad:      6.0 mmHg LVOT Vmax:         83.00 cm/s LVOT Vmean:        59.200 cm/s LVOT VTI:          0.210 m LVOT/AV VTI ratio: 0.60  AORTA Ao Root diam: 2.60 cm Ao Asc diam:  2.20 cm MITRAL VALVE MV Area (PHT): 4.29 cm     SHUNTS MV Decel Time: 177 msec     Systemic VTI:  0.21 m MV E velocity: 134.00 cm/s  Systemic Diam: 1.80 cm MV A velocity: 110.00 cm/s MV E/A ratio:  1.22 Kardie Tobb DO Electronically signed by Berniece Salines DO Signature Date/Time: 12/09/2022/1:54:35 PM    Final     Cardiac Studies   No prior available in our system  Patient Profile     77 y.o. female with type II diabetes on insulin, hyperlipidemia, prior history of CVA, reported mild carotid artery stenosis. She reports several days of nausea/vomiting, during which time she did not take her insulin. She presented with hyperglycemia, nausea/vomiting to [facility] and was found to have elevated troponin. 12/10/22 post stenting of ostial and proximal RCA by Dr Burt Knack   Assessment & Plan    Elevated troponin -SEMI - post stenting of ostial and  proximal RCA -DAT for a year  - Beta blocker and statin   Type II diabetes -presented with elevated glucose. No acidosis or gap.  In the setting of no insulin for several days due to GI symptoms -per primary team  Will arrange outpatient f/u with Dr Marshell Levan  For questions or updates, please contact [place] Please consult [email or web address] for contact info under        Signed, Jenkins Rouge, MD  12/11/2022, 8:07 AM

## 2022-12-11 NOTE — Progress Notes (Signed)
PROGRESS NOTE    Nancy Sutton  [id number] DOB: 06-16-1946 DOA: 12/08/2022 PCP: Redmond School, MD     Brief Narrative:  Nancy Sutton is a 77 year old with history of stroke with residual left hemianopsia, hypertension and type 2 diabetes presented to the emergency room with nausea and vomiting for 2 days, poor oral intake that started after eating out in the restaurant.  In the emergency room hemodynamically stable.  100% on room air.  CT scan abdomen pelvis without acute abnormality, shows multiple gallstones and gallbladder.  EKG showed T wave changes, troponins were 253-823.  Cardiology was consulted.  Admitted to [facility] as a [facility], started on heparin infusion.Patient does have history of Barrett's esophagus and gets surveillance endoscopy every year.  Due to continued symptoms, patient underwent heart cath on 3/22 which revealed severe ostial and mid RCA stenosis, both treated with PCI.  She was started on dual antiplatelet therapy with aspirin and Plavix to continue for 12 months.  New events last 24 hours / Subjective: On my initial evaluation this morning, patient was feeling well.  She had finished her breakfast without any symptoms, was ready to be discharged home.  After my initial evaluation, I was notified by RN that patient went into A-fib RVR.  Patient was reevaluated, during my evaluation, Dr. Percival Spanish also came into the room.  Discussed monitoring, dose of Coreg increased.  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[place])   Left homonymous hemianopsia   Essential hypertension, benign   Barrett's esophagus   Atrial fibrillation with RVR ([place])   NSTEMI -Status post PCI  stent -Dual antiplatelet therapy with aspirin, Plavix for 12 months, then decrease to Plavix alone -Lipitor  -Cardiology following  A-fib RVR -New onset -Holding off on anticoagulation for now -Coreg  History of GERD, Barrett's esophagus -PPI  Diabetes mellitus with hyperglycemia -Levemir, SSI   History of stroke -Aspirin, Plavix  Hypokalemia -Replace  DVT prophylaxis: SCD Place and maintain sequential compression device Start: 12/11/22 1501  Code Status: Full Family Communication: Spouse at bedside  Disposition Plan:  Status is: Inpatient Remains inpatient appropriate because: A Fib RVR   Antimicrobials:  Anti-infectives (From admission, onward)    None        Objective: Vitals:   12/10/22 2206 12/11/22 0341 12/11/22 1018 12/11/22 1341  BP: (!) 139/54 (!) 159/53 (!) 172/70 (!) 124/55  Pulse: 96 89  (!) 117  Resp:  17  18  Temp:  98.7 F (37.1 C)  98.7 F (37.1 C)  TempSrc:  Oral  Oral  SpO2: 95% 96%  99%  Weight:      Height:       No intake or output data in the 24 hours ending 12/11/22 1501  Filed Weights   12/08/22 1033 12/09/22 0416 12/10/22 1116  Weight: 55.3 kg 55 kg 57.1 kg    Examination: (initial exam)  General exam: Appears calm and comfortable  Respiratory system: Clear to auscultation. Respiratory effort normal. No respiratory distress. No conversational dyspnea.  Cardiovascular system: S1 & S2 heard, RRR. No murmurs. No pedal edema. Gastrointestinal system: Abdomen is nondistended, soft and nontender. Normal bowel sounds heard. Central nervous system: Alert and oriented. No focal neurological deficits. Speech clear.  Extremities: Symmetric in appearance  Skin: No rashes, lesions or ulcers on exposed skin  Psychiatry: Judgement and insight appear normal. Mood & affect appropriate.   Data Reviewed: I have personally reviewed following labs and imaging studies  CBC: Recent Labs  Lab 12/08/22 1049 12/09/22 0036 12/10/22 0055  12/11/22 0114  WBC 9.5 9.7 11.4* 8.2  NEUTROABS 8.0*  --   --   --   HGB 12.1 9.3* 9.2* 9.5*  HCT 36.2 28.3* 28.8* 28.0*  MCV 87.7 88.2 91.1 87.2  PLT 314 245 213 [id number]   Basic Metabolic Panel: Recent Labs  Lab 12/08/22 1049 12/09/22 0036 12/10/22 0055 12/11/22 0114  NA 134* 139 137 134*  K 3.5 3.8 4.2 3.2*  CL 95* 109 110 106  CO2 25 22 21* 21*  GLUCOSE 453* 227* 177* 183*  BUN 31* 22 16 16   CREATININE 1.16* 0.91 0.73 0.75  CALCIUM 10.1 8.7* 8.6* 8.4*   GFR: Estimated Creatinine Clearance: 46.6 mL/min (by C-G formula based on SCr of 0.75 mg/dL). Liver Function Tests: Recent Labs  Lab 12/08/22 1049  AST 34  ALT 21  ALKPHOS 63  BILITOT 1.1  PROT 8.5*  ALBUMIN 4.4   Recent Labs  Lab 12/08/22 1049  LIPASE 27   No results for input(s): "AMMONIA" in the last 168 hours. Coagulation Profile: No results for input(s): "INR", "PROTIME" in the last 168 hours. Cardiac Enzymes: No results for input(s): "CKTOTAL", "CKMB", "CKMBINDEX", "TROPONINI" in the last 168 hours. BNP (last 3 results) No results for input(s): "PROBNP" in the last 8760 hours. HbA1C: No results for input(s): "HGBA1C" in the last 72 hours. CBG: Recent Labs  Lab 12/10/22 1126 12/10/22 1434 12/10/22 1959 12/11/22 0654 12/11/22 1143  GLUCAP 138* 149* 179* 142* 163*   Lipid Profile: No results for input(s): "CHOL", "HDL", "LDLCALC", "TRIG", "CHOLHDL", "LDLDIRECT" in the last 72 hours. Thyroid Function Tests: No results for input(s): "TSH", "T4TOTAL", "FREET4", "T3FREE", "THYROIDAB" in the last 72 hours. Anemia Panel: No results for input(s): "VITAMINB12", "FOLATE", "FERRITIN", "TIBC", "IRON", "RETICCTPCT" in the last 72 hours. Sepsis Labs: No results for input(s): "PROCALCITON", "LATICACIDVEN" in the last 168 hours.  No results found for this or any previous visit (from the past 240 hour(s)).    Radiology Studies: CARDIAC CATHETERIZATION  Result Date: 12/10/2022   Ost RCA to Prox RCA lesion  is 95% stenosed.   Mid RCA lesion is 90% stenosed.   Mid Cx to Dist Cx lesion is 70% stenosed.   Dist LAD lesion is 70% stenosed.   Mid LAD lesion is 40% stenosed.   A drug-eluting stent was successfully placed using a SYNERGY XD 3.0X16.   A drug-eluting stent was successfully placed using a SYNERGY XD 3.0X20.   Post intervention, there is a 0% residual stenosis.   Post intervention, there is a 0% residual stenosis. 1.  Severe ostial and mid RCA stenoses, both treated with PCI (3.0 x 16 mm Synergy DES for the ostial lesion and 3.0 x 20 mm Synergy DES for the mid lesion). 2.  Widely patent left main with no stenosis 3.  Patent LAD with mild diffuse plaquing and moderately severe mid to distal vessel stenosis appropriate for medical therapy 4.  Patent left circumflex with moderate diffuse mid vessel stenosis, appropriate for medical therapy 5.  Normal LVEDP Recommendations: DAPT with aspirin and clopidogrel through 12 months, then resume clopidogrel monotherapy.  Aggressive medical therapy for risk reduction.  Patient eligible for hospital discharge tomorrow if no complications arise.  Scheduled Meds:  aspirin EC  81 mg Oral Daily   atorvastatin  40 mg Oral Daily   calcium carbonate  1 tablet Oral TID   carvedilol  6.25 mg Oral BID WC   clopidogrel  75 mg Oral Daily   insulin aspart  0-9 Units Subcutaneous TID WC   insulin detemir  10 Units Subcutaneous QHS   latanoprost  1 drop Both Eyes QHS   pantoprazole  40 mg Oral BID AC   sodium chloride flush  3 mL Intravenous Q12H   sodium chloride flush  3 mL Intravenous Q12H   timolol  1 drop Both Eyes BID   Continuous Infusions:  sodium chloride       LOS: 2 days   Time spent: 35 minutes   Dessa Phi, DO [facility] 12/11/2022, 3:01 PM   Available via Epic secure chat 7am-7pm After these hours, please refer to coverage provider listed on [email or web address]

## 2022-12-11 NOTE — Discharge Summary (Signed)
Physician Discharge Summary  Nancy Sutton [id number] DOB: [birth year] DOA: 12/08/2022  PCP: Redmond School, MD  Admit date: 12/08/2022 Discharge date: 12/11/2022  Admitted From: Home Disposition:  Home   Recommendations for Outpatient Follow-up:  Follow up with Cardiology  Discharge Condition: Stable CODE STATUS: Full  Diet recommendation: Heart healthy  Brief/Interim Summary:  Nancy Sutton is a 77 year old with history of stroke with residual left hemianopsia, hypertension and type 2 diabetes presented to the emergency room with nausea and vomiting for 2 days, poor oral intake that started after eating out in the restaurant.  In the emergency room hemodynamically stable.  100% on room air.  CT scan abdomen pelvis without acute abnormality, shows multiple gallstones and gallbladder.  EKG showed T wave changes, troponins were 253-823.  Cardiology was consulted.  Admitted to [facility] as a transfer from [facility], started on heparin infusion.Patient does have history of Barrett's esophagus and gets surveillance endoscopy every year.   Due to continued symptoms, patient underwent heart cath on 3/22 which revealed severe ostial and mid RCA stenosis, both treated with PCI.  She was started on dual antiplatelet therapy with aspirin and Plavix to continue for 12 months.  She went into A Fib RVR and was re-evaluated by cardiology. Coreg dose adjusted. Patient converted back into NSR prior to discharge home.   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) ([place])   Left homonymous hemianopsia   Essential hypertension, benign   Barrett's esophagus   Atrial fibrillation with RVR ([place])   NSTEMI -Status post PCI stent -Dual  antiplatelet therapy with aspirin, Plavix for 12 months, then decrease to Plavix alone -Lipitor  -Cardiology following   A-fib RVR -New onset -Holding off on anticoagulation for now -Coreg   History of GERD, Barrett's esophagus -PPI   Diabetes mellitus with hyperglycemia -Levemir, SSI    History of stroke -Aspirin, Plavix   Hypokalemia -Replace  Discharge Instructions  Discharge Instructions     Amb Referral to Cardiac Rehabilitation   Complete by: As directed    Diagnosis:  Coronary Stents NSTEMI     After initial evaluation and assessments completed: Virtual Based Care may be provided alone or in conjunction with Phase 2 Cardiac Rehab based on patient barriers.: Yes   Intensive Cardiac Rehabilitation (ICR) [place] location only OR Traditional Cardiac Rehabilitation (TCR) *If criteria for ICR are not met will enroll in TCR [facility] only): Yes   Call MD for:  difficulty breathing, headache or visual disturbances   Complete by: As directed    Call MD for:  extreme fatigue   Complete by: As directed    Call MD for:  persistant dizziness or light-headedness   Complete by: As directed    Call MD for:  persistant nausea and vomiting   Complete by: As directed    Call MD for:  severe uncontrolled pain   Complete by: As directed    Call MD for:  temperature >100.4   Complete by: As directed    Diet - low sodium heart healthy   Complete by: As directed    Discharge instructions   Complete by: As directed    You were cared for by a hospitalist during your hospital stay. If you have any questions about your  discharge medications or the care you received while you were in the hospital after you are discharged, you can call the unit and ask to speak with the hospitalist on call if the hospitalist that took care of you is not available. Once you are discharged, your primary care physician will handle any further medical issues. Please note that NO REFILLS for any discharge medications will  be authorized once you are discharged, as it is imperative that you return to your primary care physician (or establish a relationship with a primary care physician if you do not have one) for your aftercare needs so that they can reassess your need for medications and monitor your lab values.   Increase activity slowly   Complete by: As directed       Allergies as of 12/11/2022       Reactions   Ampicillin Hives   Breaks out in bumps Has patient had a PCN reaction causing immediate rash, facial/tongue/throat swelling, SOB or lightheadedness with hypotension: No Has patient had a PCN reaction causing severe rash involving mucus membranes or skin necrosis: Yes Has patient had a PCN reaction that required hospitalization: No Has patient had a PCN reaction occurring within the last 10 years: No If all of the above answers are "NO", then may proceed with Cephalosporin use.   Bromday [bromfenac Sodium] Itching, Swelling   Eye Drops   Lisinopril Cough   Tetanus Toxoids Hives   Breaks out in bumps        Medication List     STOP taking these medications    naproxen sodium 220 MG tablet Commonly known as: ALEVE   simvastatin 80 MG tablet Commonly known as: ZOCOR       TAKE these medications    ascorbic acid 500 MG tablet Commonly known as: VITAMIN C Take 500 mg by mouth daily.   aspirin EC 81 MG tablet Take 1 tablet (81 mg total) by mouth daily. Swallow whole. Start taking on: December 12, 2022   atorvastatin 40 MG tablet Commonly known as: LIPITOR Take 1 tablet (40 mg total) by mouth daily. Start taking on: December 12, 2022   calcium carbonate 500 MG chewable tablet Commonly known as: TUMS - dosed in mg elemental calcium Chew 1 tablet by mouth at bedtime as needed for indigestion or heartburn.   carvedilol 6.25 MG tablet Commonly known as: COREG Take 1 tablet (6.25 mg total) by mouth 2 (two) times daily with a meal.   cholecalciferol 25 MCG (1000 UNIT)  tablet Commonly known as: VITAMIN D3 Take 1,000 Units by mouth daily.   clopidogrel 75 MG tablet Commonly known as: PLAVIX Take 1 tablet (75 mg total) by mouth daily. Start taking on: December 12, 2022   ezetimibe 10 MG tablet Commonly known as: ZETIA Take 10 mg by mouth at bedtime.   Fish Oil 1000 MG Caps Take 1,000 mg by mouth daily.   Global Ease Inject Pen Needles 32G X 4 MM Misc Generic drug: Insulin Pen Needle USE 4 PENTIPS EVERY DAY   Global Inject Ease Lancets 30G Misc DIABETIC CLUB   Glucagon 3 MG/DOSE Powd Place 3 mg into the nose once as needed for up to 1 dose.   Hair Skin and Nails Formula Tabs Take 1 tablet by mouth daily.   insulin detemir 100 UNIT/ML FlexPen Commonly known as: LEVEMIR Inject 27 Units into the skin at bedtime.   latanoprost 0.005 % ophthalmic solution Commonly known as: XALATAN Place 1 drop into both  eyes at bedtime.   metFORMIN 500 MG tablet Commonly known as: GLUCOPHAGE Take 500 mg by mouth daily with breakfast.   NovoLOG FlexPen 100 UNIT/ML FlexPen Generic drug: insulin aspart Inject 10-12 Units into the skin 3 (three) times daily with meals. What changed: how much to take   omeprazole 40 MG capsule Commonly known as: PRILOSEC TAKE ONE CAPSULE BY MOUTH DAILY   ONE TOUCH ULTRA 2 w/Device Kit : Use as instructed 4 x daily   ONE-A-DAY 55 PLUS PO Take 1 tablet by mouth daily.   OneTouch Ultra test strip Generic drug: glucose blood test blood sugar FOUR TIMES DAILY   timolol 0.5 % ophthalmic solution Commonly known as: BETIMOL Place 1 drop into both eyes 2 (two) times daily.   Trulicity 1.5 0000000 Sopn Generic drug: Dulaglutide Inject 1.5 mg into the skin once a week.   vitamin E 180 MG (400 UNITS) capsule Take 400 Units by mouth daily.        Follow-up Information     Redmond School, MD Follow up.   Specialty: Internal Medicine Contact information: [address] [place]  [id number] [phone]                Allergies  Allergen Reactions   Ampicillin Hives    Breaks out in bumps Has patient had a PCN reaction causing immediate rash, facial/tongue/throat swelling, SOB or lightheadedness with hypotension: No Has patient had a PCN reaction causing severe rash involving mucus membranes or skin necrosis: Yes Has patient had a PCN reaction that required hospitalization: No Has patient had a PCN reaction occurring within the last 10 years: No If all of the above answers are "NO", then may proceed with Cephalosporin use.    Bromday [Bromfenac Sodium] Itching and Swelling    Eye Drops   Lisinopril Cough   Tetanus Toxoids Hives    Breaks out in bumps    Consultations: Cardiology    Procedures/Studies: CARDIAC CATHETERIZATION  Result Date: 12/10/2022   Ost RCA to Prox RCA lesion is 95% stenosed.   Mid RCA lesion is 90% stenosed.   Mid Cx to Dist Cx lesion is 70% stenosed.   Dist LAD lesion is 70% stenosed.   Mid LAD lesion is 40% stenosed.   A drug-eluting stent was successfully placed using a SYNERGY XD 3.0X16.   A drug-eluting stent was successfully placed using a SYNERGY XD 3.0X20.   Post intervention, there is a 0% residual stenosis.   Post intervention, there is a 0% residual stenosis. 1.  Severe ostial and mid RCA stenoses, both treated with PCI (3.0 x 16 mm Synergy DES for the ostial lesion and 3.0 x 20 mm Synergy DES for the mid lesion). 2.  Widely patent left main with no stenosis 3.  Patent LAD with mild diffuse plaquing and moderately severe mid to distal vessel stenosis appropriate for medical therapy 4.  Patent left circumflex with moderate diffuse mid vessel stenosis, appropriate for medical therapy 5.  Normal LVEDP Recommendations: DAPT with aspirin and clopidogrel through 12 months, then resume clopidogrel monotherapy.  Aggressive medical therapy for risk reduction.  Patient eligible for hospital discharge tomorrow if no complications arise.    ECHOCARDIOGRAM COMPLETE  Result Date: 12/09/2022    ECHOCARDIOGRAM REPORT   Patient Name:   Nancy Sutton Date of Exam: 12/09/2022 Medical Rec #:  [id number]      Height:       60.0 in Accession #:    [id number]  Weight:       121.3 lb Date of Birth:  [birth year]      BSA:          1.509 m Patient Age:    57 years       BP:           155/65 mmHg Patient Gender: F              HR:           101 bpm. Exam Location:  Inpatient Procedure: Cardiac Doppler, Color Doppler and 2D Echo Indications:    Elevated Troponin  History:        Patient has no prior history of Echocardiogram examinations.                 Risk Factors:Hypertension and Diabetes.  Sonographer:    Marella Chimes Referring Phys: [place]  1. Left ventricular ejection fraction, by estimation, is 50 to 55%. The left ventricle has low normal function. The left ventricle has no regional wall motion abnormalities. There is mild concentric left ventricular hypertrophy. Left ventricular diastolic parameters are consistent with Grade II diastolic dysfunction (pseudonormalization).  2. Right ventricular systolic function is normal. The right ventricular size is normal. Tricuspid regurgitation signal is inadequate for assessing PA pressure.  3. The mitral valve is normal in structure. Mild mitral valve regurgitation. No evidence of mitral stenosis.  4. The aortic valve is normal in structure. Aortic valve regurgitation is not visualized. No aortic stenosis is present.  5. The inferior vena cava is dilated in size with <50% respiratory variability, suggesting right atrial pressure of 15 mmHg. FINDINGS  Left Ventricle: Left ventricular ejection fraction, by estimation, is 50 to 55%. The left ventricle has low normal function. The left ventricle has no regional wall motion abnormalities. The left ventricular internal cavity size was normal in size. There is mild concentric left ventricular hypertrophy. Left ventricular diastolic  parameters are consistent with Grade II diastolic dysfunction (pseudonormalization). Right Ventricle: The right ventricular size is normal. No increase in right ventricular wall thickness. Right ventricular systolic function is normal. Tricuspid regurgitation signal is inadequate for assessing PA pressure. Left Atrium: Left atrial size was normal in size. Right Atrium: Right atrial size was normal in size. Pericardium: There is no evidence of pericardial effusion. Mitral Valve: The mitral valve is normal in structure. Mild mitral valve regurgitation. No evidence of mitral valve stenosis. Tricuspid Valve: The tricuspid valve is normal in structure. Tricuspid valve regurgitation is not demonstrated. No evidence of tricuspid stenosis. Aortic Valve: The aortic valve is normal in structure. Aortic valve regurgitation is not visualized. No aortic stenosis is present. Aortic valve mean gradient measures 6.0 mmHg. Aortic valve peak gradient measures 10.1 mmHg. Aortic valve area, by VTI measures 1.52 cm. Pulmonic Valve: The pulmonic valve was normal in structure. Pulmonic valve regurgitation is not visualized. No evidence of pulmonic stenosis. Aorta: The aortic root is normal in size and structure. Venous: The inferior vena cava is dilated in size with less than 50% respiratory variability, suggesting right atrial pressure of 15 mmHg. IAS/Shunts: No atrial level shunt detected by color flow Doppler.  LEFT VENTRICLE PLAX 2D LVIDd:         3.80 cm     Diastology LVIDs:         3.20 cm     LV e' medial:    6.64 cm/s LV PW:         0.90 cm  LV E/e' medial:  20.2 LV IVS:        0.90 cm     LV e' lateral:   12.20 cm/s LVOT diam:     1.80 cm     LV E/e' lateral: 11.0 LV SV:         53 LV SV Index:   35 LVOT Area:     2.54 cm  LV Volumes (MOD) LV vol d, MOD A4C: 66.5 ml LV vol s, MOD A4C: 31.7 ml LV SV MOD A4C:     66.5 ml RIGHT VENTRICLE             IVC RV S prime:     14.70 cm/s  IVC diam: 2.60 cm TAPSE (M-mode): 2.7 cm LEFT  ATRIUM             Index        RIGHT ATRIUM          Index LA Vol (A2C):   48.5 ml 32.14 ml/m  RA Area:     9.21 cm LA Vol (A4C):   45.2 ml 29.95 ml/m  RA Volume:   17.40 ml 11.53 ml/m LA Biplane Vol: 49.0 ml 32.47 ml/m  AORTIC VALVE AV Area (Vmax):    1.33 cm AV Area (Vmean):   1.36 cm AV Area (VTI):     1.52 cm AV Vmax:           159.00 cm/s AV Vmean:          111.000 cm/s AV VTI:            0.351 m AV Peak Grad:      10.1 mmHg AV Mean Grad:      6.0 mmHg LVOT Vmax:         83.00 cm/s LVOT Vmean:        59.200 cm/s LVOT VTI:          0.210 m LVOT/AV VTI ratio: 0.60  AORTA Ao Root diam: 2.60 cm Ao Asc diam:  2.20 cm MITRAL VALVE MV Area (PHT): 4.29 cm     SHUNTS MV Decel Time: 177 msec     Systemic VTI:  0.21 m MV E velocity: 134.00 cm/s  Systemic Diam: 1.80 cm MV A velocity: 110.00 cm/s MV E/A ratio:  1.22 Kardie Tobb DO Electronically signed by Berniece Salines DO Signature Date/Time: 12/09/2022/1:54:35 PM    Final    CT ABDOMEN PELVIS W CONTRAST  Result Date: 12/08/2022 CLINICAL DATA:  Vomiting over the last 3 days. Bowel obstruction suspected. EXAM: CT ABDOMEN AND PELVIS WITH CONTRAST TECHNIQUE: Multidetector CT imaging of the abdomen and pelvis was performed using the standard protocol following bolus administration of intravenous contrast. RADIATION DOSE REDUCTION: This exam was performed according to the departmental dose-optimization program which includes automated exposure control, adjustment of the mA and/or kV according to patient size and/or use of iterative reconstruction technique. CONTRAST:  141mL OMNIPAQUE IOHEXOL 300 MG/ML  SOLN COMPARISON:  None Available. FINDINGS: Lower chest: Mild scarring at the lung bases. No active process. Small hiatal hernia. Hepatobiliary: Few small calcified granulomas. Scattered small cysts. No worrisome liver lesion. The gallbladder contains multiple small stones, 1-4 mm in size, the size that would be at high risk of passing into the ductal system. However,  there is no ductal dilatation or visible ductal stone. No CT evidence of cholecystitis. If concern persists, consider ultrasound or nuclear medicine scan. Pancreas: Normal Spleen: Normal Adrenals/Urinary Tract: Adrenal glands are normal. Kidneys are normal.  No cyst, mass, stone or hydronephrosis. Bladder is full but normal. Stomach/Bowel: Small hiatal hernia as noted above. Stomach is otherwise normal. The small bowel is normal. No obstruction or ileus. Normal appendix. Normal colon. Vascular/Lymphatic: Aortic atherosclerosis. No aneurysm. IVC is normal. No adenopathy. Reproductive: No pelvic mass.  Few small leiomyomas of the uterus. Other: No free fluid or air. Musculoskeletal: Ordinary spinal degenerative changes. IMPRESSION: 1. No acute finding to explain the clinical presentation. No evidence of bowel obstruction or ileus. 2. Small hiatal hernia. 3. Multiple small stones in the gallbladder, 1-4 mm in size, the size that would be at high risk of passing into the ductal system. However, there is no ductal dilatation or visible ductal stone. No CT evidence of cholecystitis. If concern persists, consider ultrasound or nuclear medicine scan. 4. Aortic atherosclerosis. 5. Few small leiomyomas of the uterus. Aortic Atherosclerosis (ICD10-I70.0). Electronically Signed   By: Nelson Chimes M.D.   On: 12/08/2022 15:40       Discharge Exam: Vitals:   12/11/22 1341 12/11/22 1622  BP: (!) 124/55 (!) 147/68  Pulse: (!) 117 83  Resp: 18 16  Temp: 98.7 F (37.1 C) 98.4 F (36.9 C)  SpO2: 99% 100%    General: Pt is alert, awake, not in acute distress Cardiovascular: RRR, S1/S2 +, no edema Respiratory: CTA bilaterally, no wheezing, no rhonchi, no respiratory distress, no conversational dyspnea  Abdominal: Soft, NT, ND, bowel sounds + Extremities: no edema, no cyanosis Psych: Normal mood and affect, stable judgement and insight     The results of significant diagnostics from this hospitalization (including  imaging, microbiology, ancillary and laboratory) are listed below for reference.     Microbiology: No results found for this or any previous visit (from the past 240 hour(s)).   Labs: BNP (last 3 results) No results for input(s): "BNP" in the last 8760 hours. Basic Metabolic Panel: Recent Labs  Lab 12/08/22 1049 12/09/22 0036 12/10/22 0055 12/11/22 0114  NA 134* 139 137 134*  K 3.5 3.8 4.2 3.2*  CL 95* 109 110 106  CO2 25 22 21* 21*  GLUCOSE 453* 227* 177* 183*  BUN 31* 22 16 16   CREATININE 1.16* 0.91 0.73 0.75  CALCIUM 10.1 8.7* 8.6* 8.4*   Liver Function Tests: Recent Labs  Lab 12/08/22 1049  AST 34  ALT 21  ALKPHOS 63  BILITOT 1.1  PROT 8.5*  ALBUMIN 4.4   Recent Labs  Lab 12/08/22 1049  LIPASE 27   No results for input(s): "AMMONIA" in the last 168 hours. CBC: Recent Labs  Lab 12/08/22 1049 12/09/22 0036 12/10/22 0055 12/11/22 0114  WBC 9.5 9.7 11.4* 8.2  NEUTROABS 8.0*  --   --   --   HGB 12.1 9.3* 9.2* 9.5*  HCT 36.2 28.3* 28.8* 28.0*  MCV 87.7 88.2 91.1 87.2  PLT 314 245 213 174   Cardiac Enzymes: No results for input(s): "CKTOTAL", "CKMB", "CKMBINDEX", "TROPONINI" in the last 168 hours. BNP: Invalid input(s): "POCBNP" CBG: Recent Labs  Lab 12/10/22 1434 12/10/22 1959 12/11/22 0654 12/11/22 1143 12/11/22 1600  GLUCAP 149* 179* 142* 163* 200*   D-Dimer No results for input(s): "DDIMER" in the last 72 hours. Hgb A1c No results for input(s): "HGBA1C" in the last 72 hours. Lipid Profile No results for input(s): "CHOL", "HDL", "LDLCALC", "TRIG", "CHOLHDL", "LDLDIRECT" in the last 72 hours. Thyroid function studies No results for input(s): "TSH", "T4TOTAL", "T3FREE", "THYROIDAB" in the last 72 hours.  Invalid input(s): "FREET3" Anemia work up No  results for input(s): "VITAMINB12", "FOLATE", "FERRITIN", "TIBC", "IRON", "RETICCTPCT" in the last 72 hours. Urinalysis    Component Value Date/Time   COLORURINE YELLOW 04/30/2011 2115    APPEARANCEUR CLOUDY (A) 04/30/2011 2115   LABSPEC 1.031 (H) 04/30/2011 2115   PHURINE 5.0 04/30/2011 2115   GLUCOSEU NEGATIVE 04/30/2011 2115   HGBUR NEGATIVE 04/30/2011 2115   BILIRUBINUR NEGATIVE 04/30/2011 2115   KETONESUR 15 (A) 04/30/2011 2115   PROTEINUR NEGATIVE 04/30/2011 2115   UROBILINOGEN 0.2 04/30/2011 2115   NITRITE NEGATIVE 04/30/2011 2115   LEUKOCYTESUR TRACE (A) 04/30/2011 2115   Sepsis Labs Recent Labs  Lab 12/08/22 1049 12/09/22 0036 12/10/22 0055 12/11/22 0114  WBC 9.5 9.7 11.4* 8.2   Microbiology No results found for this or any previous visit (from the past 240 hour(s)).   Patient was seen and examined on the day of discharge and was found to be in stable condition. Time coordinating discharge: 35 minutes including assessment and coordination of care, as well as examination of the patient.   SIGNED:  Dessa Phi, DO [facility] 12/11/2022, 6:16 PM

## 2022-12-11 NOTE — H&P (Signed)
    Called to see patient with atrial fib.  Rate in the 120s.  She does not feel this.  No history of this.  For now I am not going to start Eliquis as she is on ASA/Plavix post PCI.  If she goes back into NSR later today on her own we can send her home and will arrange a monitor.  If she is in atrial fib overnight we will need to start [place].  BP has been running high.  I am going to increase her Coreg with increased dose to start now.

## 2022-12-12 ENCOUNTER — Other Ambulatory Visit (INDEPENDENT_AMBULATORY_CARE_PROVIDER_SITE_OTHER): Payer: Self-pay | Admitting: Gastroenterology

## 2022-12-12 DIAGNOSIS — K2100 Gastro-esophageal reflux disease with esophagitis, without bleeding: Secondary | ICD-10-CM

## 2022-12-13 ENCOUNTER — Encounter (HOSPITAL_COMMUNITY): Payer: Self-pay | Admitting: Cardiovascular Disease

## 2022-12-13 ENCOUNTER — Other Ambulatory Visit: Payer: Self-pay | Admitting: Cardiology

## 2022-12-13 ENCOUNTER — Ambulatory Visit: Payer: PPO | Attending: Cardiology

## 2022-12-13 DIAGNOSIS — I480 Paroxysmal atrial fibrillation: Secondary | ICD-10-CM

## 2022-12-13 LAB — LIPOPROTEIN A (LPA): Lipoprotein (a): 19.3 nmol/L (ref <75.0)

## 2022-12-13 NOTE — Progress Notes (Unsigned)
Enrolled for [employer] to mail a ZIO XT long term holter monitor to the patients address on file.  

## 2022-12-16 ENCOUNTER — Telehealth: Payer: Self-pay | Admitting: Student

## 2022-12-16 DIAGNOSIS — I480 Paroxysmal atrial fibrillation: Secondary | ICD-10-CM | POA: Diagnosis not present

## 2022-12-16 MED ORDER — DILTIAZEM HCL ER COATED BEADS 120 MG PO CP24: 120.0000 mg | ORAL_CAPSULE | Freq: Every day | ORAL | 2 refills | Status: DC

## 2022-12-16 NOTE — Telephone Encounter (Addendum)
  Received page from Answering Service that patient had concerns about significant fatigue after recent PCI. Called and spoke with husband (patient as in the background). Patient was admitted last week for a NSTEMI and under DES to ostial RCA and DES to distal RCA. She also had a brief episode of atrial fibrillation. She was started on DAPT and Coreg. Husband states since being home she has had significant fatigue which started in the hospital but has only gotten worse. This is very unlike her. She denies any chest pain or shortness of breath. She also denies any vomiting (which was her presenting symptom). BP 122/59 and HR 91. I suspect fatigue may be due to Coreg. Recommended stopping this. However, I think she may need something else to help control her BP and HR. Discussed trying another beta-blocker vs trying Cardizem. Given the degree of fatigue that husband describes, I fear that she will have fatigue on a different beta-blocker as well. Therefore, will try Diltiazem 120mg  daily which she can start tomorrow morning. Patient and husband agreeable to this. Advised monitoring BP/HR closely with this changed and asked them to let us know if fatigue does not improve off the Coreg. Also re-emphasized the importance of DAPT while on the phone. Husband very appreciative of the call. She has a close follow-up arranged with Richardson Dopp, PA-C, on 12/27/2022.   Darreld Mclean, PA-C 12/16/2022 7:17 PM

## 2022-12-23 ENCOUNTER — Other Ambulatory Visit (INDEPENDENT_AMBULATORY_CARE_PROVIDER_SITE_OTHER): Payer: Self-pay | Admitting: Gastroenterology

## 2022-12-23 ENCOUNTER — Other Ambulatory Visit: Payer: Self-pay

## 2022-12-23 DIAGNOSIS — E139 Other specified diabetes mellitus without complications: Secondary | ICD-10-CM

## 2022-12-23 DIAGNOSIS — K2100 Gastro-esophageal reflux disease with esophagitis, without bleeding: Secondary | ICD-10-CM

## 2022-12-23 MED ORDER — METFORMIN HCL ER 500 MG PO TB24
500.0000 mg | ORAL_TABLET | Freq: Every day | ORAL | 1 refills | Status: DC
Start: 2022-12-23 — End: 2023-01-07

## 2022-12-26 NOTE — Progress Notes (Unsigned)
Cardiology Office Note:    Date:  12/27/2022  ID:  Nancy Sutton, DOB 06/13/1946, MRN [id number] [facility] Providers Cardiologist:  Christell Constant, MD      Patient Profile:   Coronary artery disease  NSTEMI 11/2022 s/p 3 x 16 mm DES to the [facility], 3 x 20 mm DES to the [facility] 12/13/2022: RCA ostial 95, mid 90; LCx mid 70; LAD mid 40, distal 70>> Med Rx for LAD, LCx TTE 12/09/2022: EF 50-55, no RWMA, mild concentric LVH, GR 2 DD, normal RVSF, mild MR, RAP 15 Paroxysmal atrial fibrillation  Post MI 11/2022 >> spontaneous conv to NSR - pending monitor  Hx of CVA (residual L hemianopsia) Hypertension  Hyperlipidemia  Diabetes mellitus, on insulin Barrett's Esophagus     History of Present Illness:   Nancy Sutton is a 77 y.o. female who presents for post hospital follow up. She was admitted 3/20-3/23 with a NSTEMI. She initially presented with 2 days of nausea and vomiting. Her hsTrops were elevated. Cardiac catheterization demonstrated high grade disease in the ostial and mid RCA. She underwent PCI with DES x 2 to the RCA. She had residual moderately severe dist LAD and moderate mid LCx disease that are appropriate for med Rx. EF was normal on echocardiogram . She went into AF w RVR just prior to DC. It was decided to hold off on anticoagulation unless she remained in persistent atrial fibrillation. She converted to NSR prior to DC and has a Zio monitor pending to assess for recurrent atrial fibrillation. Post DC she called in with significant fatigue. Her Coreg was DC'd and she was placed on Cardizem.  She is here today with her daughter.  Since discharge, she continues to feel fatigued.  However, this is improved since switching from carvedilol to diltiazem.  She has not had chest pain, significant shortness of breath, syncope, near syncope.  She has not had further nausea.  She does note some bruising around the insulin injection sites on her abdomen.  She would like to start  riding her stationary bike again.  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  Review of Systems  Hematologic/Lymphatic: Bruises/bleeds easily.  Gastrointestinal:  Negative for hematochezia and melena.  Genitourinary:  Negative for hematuria.       Studies Reviewed:    EKG: NSR, HR 75, normal axis, nonspecific ST-T wave changes, QTc 444   Risk Assessment/Calculations:    CHA2DS2-VASc Score = 8   This indicates a 10.8% annual risk of stroke. The patient's score is based upon: CHF History: 0 HTN History: 1 Diabetes History: 1 Stroke History: 2 Vascular Disease History: 1 Age Score: 2 Gender Score: 1    HYPERTENSION CONTROL Vitals:   12/27/22 1044 12/27/22 1145  BP: (!) 160/58 (!) 160/74    The patient's blood pressure is elevated above target today.  In order to address the patient's elevated BP: The blood pressure is usually elevated in clinic.  Blood pressures monitored at home have been optimal.          Physical Exam:   VS:  BP (!) 160/74   Pulse 75   Ht 5' (1.524 m)   Wt 122 lb 12.8 oz (55.7 kg)   SpO2 95%   BMI 23.98 kg/m    Wt Readings from Last 3 Encounters:  12/27/22 122 lb 12.8 oz (55.7 kg)  12/10/22 125 lb 12.7 oz (57.1 kg)  10/01/22 123 lb 9.6 oz (56.1 kg)  Constitutional:      Appearance: Healthy appearance. Not in distress.  Neck:     Vascular: JVD normal.  Pulmonary:     Breath sounds: Normal breath sounds. No wheezing. No rales.  Cardiovascular:     Normal rate. Regular rhythm. Normal S1. Normal S2.      Murmurs: There is no murmur.     Comments: Right wrist without hematoma Edema:    Peripheral edema absent.  Abdominal:     Palpations: Abdomen is soft.       ASSESSMENT AND PLAN:   NSTEMI (non-ST elevated myocardial infarction) (HCC) Status post non-STEMI treated with DES x 2 to the RCA.  She has residual disease in the LCx and LAD which is managed medically.  Her presenting symptom was nausea.  She has not had  further symptoms to suggest angina.  She still remains fatigued with this is improved.  I have encouraged her to start cardiac rehabilitation.  I have encouraged her to increase activity at home.  She has difficulty walking due to knee arthritis.  She has a stationary bike.  I have advised her to ride this at a low level for 5 minutes twice a day to start.  Continue aspirin 81 mg daily, Plavix 75 mg daily, Lipitor 40 mg daily.  She could not tolerate beta-blocker secondary to fatigue.  She is doing well on diltiazem.  Continue diltiazem 120 mg daily.  Follow-up in 3 months (she prefers to come to [place]).  Paroxysmal atrial fibrillation Maintaining sinus rhythm.  She has 2 days left on her ZIO XT monitor.  If she has recurrent atrial fibrillation, she will need long-term anticoagulation.  If she needs long-term anticoagulation, we will need to stop her aspirin 30 days after PCI and continue on Plavix in addition to anticoagulation.  Continue diltiazem 120 mg daily.  Hyperlipidemia Goal LDL <55.  Continue Lipitor 40 mg daily, Zetia 10 mg daily.  Arrange fasting lipids, CMET in 6 weeks.  Essential hypertension, benign Blood pressures at home are optimal.  Her blood pressure is usually elevated when she comes into the office.  Continue to monitor at home.  Continue diltiazem 120 mg daily.  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  I suspect that this is related to dual antiplatelet therapy.  However, I have asked her to review this with her endocrinologist as well.    Cardiac Rehabilitation Eligibility Assessment  The patient is ready to start cardiac rehabilitation from a cardiac standpoint.       Dispo:  Return in about 3 months (around 03/28/2023) for Routine Follow Up with Dr. Raynelle Jan, or Tereso Newcomer, PA-C.  Signed, Tereso Newcomer, PA-C

## 2022-12-27 ENCOUNTER — Other Ambulatory Visit: Payer: Self-pay | Admitting: *Deleted

## 2022-12-27 ENCOUNTER — Ambulatory Visit: Payer: PPO | Attending: Physician Assistant | Admitting: Physician Assistant

## 2022-12-27 ENCOUNTER — Encounter: Payer: Self-pay | Admitting: Physician Assistant

## 2022-12-27 VITALS — BP 160/74 | HR 75 | Ht 60.0 in | Wt 122.8 lb

## 2022-12-27 DIAGNOSIS — E139 Other specified diabetes mellitus without complications: Secondary | ICD-10-CM

## 2022-12-27 DIAGNOSIS — I10 Essential (primary) hypertension: Secondary | ICD-10-CM | POA: Diagnosis not present

## 2022-12-27 DIAGNOSIS — E782 Mixed hyperlipidemia: Secondary | ICD-10-CM

## 2022-12-27 DIAGNOSIS — I480 Paroxysmal atrial fibrillation: Secondary | ICD-10-CM

## 2022-12-27 DIAGNOSIS — I214 Non-ST elevation (NSTEMI) myocardial infarction: Secondary | ICD-10-CM | POA: Diagnosis not present

## 2022-12-27 NOTE — Assessment & Plan Note (Signed)
Goal LDL <55.  Continue Lipitor 40 mg daily, Zetia 10 mg daily.  Arrange fasting lipids, CMET in 6 weeks.

## 2022-12-27 NOTE — Patient Instructions (Signed)
Medication Instructions:  Your physician recommends that you continue on your current medications as directed. Please refer to the Current Medication list given to you today.  *If you need a refill on your cardiac medications before your next appointment, please call your pharmacy*   Lab Work: 6 WEEKS:  GO TO [facility] AT [place] OR A[address]OSS [address] FOR FASTING LIPID & CMET  If you have labs (blood work) drawn today and your tests are completely normal, you will receive your results only by: MyChart Message (if you have MyChart) OR A paper copy in the mail If you have any lab test that is abnormal or we need to change your treatment, we will call you to review the results.   Testing/Procedures: None ordered   Follow-Up: At [facility], you and your health needs are our priority.  As part of our continuing mission to provide you with exceptional heart care, we have created designated Provider Care Teams.  These Care Teams include your primary Cardiologist (physician) and Advanced Practice Providers (APPs -  Physician Assistants and Nurse Practitioners) who all work together to provide you with the care you need, when you need it.  We recommend signing up for the patient portal called "MyChart".  Sign up information is provided on this After Visit Summary.  MyChart is used to connect with patients for Virtual Visits (Telemedicine).  Patients are able to view lab/test results, encounter notes, upcoming appointments, etc.  Non-urgent messages can be sent to your provider as well.   To learn more about what you can do with MyChart, go to [email or web address].    Your next appointment:   3 month(s)  Provider:   Christell Constant, MD  or Tereso Newcomer, PA-C         Other Instructions

## 2022-12-27 NOTE — Assessment & Plan Note (Signed)
Blood pressures at home are optimal.  Her blood pressure is usually elevated when she comes into the office.  Continue to monitor at home.  Continue diltiazem 120 mg daily.

## 2022-12-27 NOTE — Assessment & Plan Note (Signed)
She developed bruising at the site of each insulin injection in her abdomen.  I suspect that this is related to dual antiplatelet therapy.  However, I have asked her to review this with her endocrinologist as well.

## 2022-12-27 NOTE — Assessment & Plan Note (Signed)
Maintaining sinus rhythm.  She has 2 days left on her ZIO XT monitor.  If she has recurrent atrial fibrillation, she will need long-term anticoagulation.  If she needs long-term anticoagulation, we will need to stop her aspirin 30 days after PCI and continue on Plavix in addition to anticoagulation.  Continue diltiazem 120 mg daily.

## 2022-12-27 NOTE — Assessment & Plan Note (Signed)
Status post non-STEMI treated with DES x 2 to the RCA.  She has residual disease in the LCx and LAD which is managed medically.  Her presenting symptom was nausea.  She has not had further symptoms to suggest angina.  She still remains fatigued with this is improved.  I have encouraged her to start cardiac rehabilitation.  I have encouraged her to increase activity at home.  She has difficulty walking due to knee arthritis.  She has a stationary bike.  I have advised her to ride this at a low level for 5 minutes twice a day to start.  Continue aspirin 81 mg daily, Plavix 75 mg daily, Lipitor 40 mg daily.  She could not tolerate beta-blocker secondary to fatigue.  She is doing well on diltiazem.  Continue diltiazem 120 mg daily.  Follow-up in 3 months (she prefers to come to [place]).

## 2022-12-29 ENCOUNTER — Telehealth: Payer: Self-pay

## 2022-12-29 DIAGNOSIS — E139 Other specified diabetes mellitus without complications: Secondary | ICD-10-CM

## 2022-12-29 MED ORDER — TRULICITY 0.75 MG/0.5ML ~~LOC~~ SOAJ
0.7500 mg | SUBCUTANEOUS | 3 refills | Status: DC
Start: 2022-12-29 — End: 2023-03-29

## 2022-12-29 NOTE — Telephone Encounter (Signed)
Pt called to advise Nancy Sutton patient assistance Trulicity 1.5 mg dose is backordered and she request to be switched to 0.75 mg dose as the 3 mg dose cause severe diarrhea. New rx sent.  [employer] and faxed to [employer] after pt was advised she did not have a current application on file.

## 2023-01-04 DIAGNOSIS — I480 Paroxysmal atrial fibrillation: Secondary | ICD-10-CM | POA: Diagnosis not present

## 2023-01-04 NOTE — Telephone Encounter (Signed)
Fax received, application approved.   

## 2023-01-07 ENCOUNTER — Telehealth: Payer: Self-pay | Admitting: Internal Medicine

## 2023-01-07 MED ORDER — METFORMIN HCL ER 500 MG PO TB24
500.0000 mg | ORAL_TABLET | Freq: Every day | ORAL | 1 refills | Status: DC
Start: 2023-01-07 — End: 2023-05-02

## 2023-01-07 MED ORDER — DILTIAZEM HCL ER COATED BEADS 120 MG PO CP24: 120.0000 mg | ORAL_CAPSULE | Freq: Every day | ORAL | 3 refills | Status: DC

## 2023-01-07 MED ORDER — ATORVASTATIN CALCIUM 40 MG PO TABS: 40.0000 mg | ORAL_TABLET | Freq: Every day | ORAL | 3 refills | Status: DC

## 2023-01-07 NOTE — Telephone Encounter (Signed)
Pt's medications were sent to pt's pharmacy as requested. Confirmation received.  

## 2023-01-07 NOTE — Telephone Encounter (Signed)
*  STAT* If patient is at the pharmacy, call can be transferred to refill team.   1. Which medications need to be refilled? (please list name of each medication and dose if known) atorvastatin (LIPITOR) 40 MG tablet   2. Which pharmacy/location (including street and city if local pharmacy) is medication to be sent to? diltiazem (CARDIZEM CD) 120 MG 24 hr capsule   3. Do they need a 30 day or 90 day supply? 90 day  Pt has 5 pills left.

## 2023-01-07 NOTE — Addendum Note (Signed)
Addended by: Kenyon Ana on: 01/07/2023 04:34 PM   Modules accepted: Orders

## 2023-01-10 ENCOUNTER — Encounter: Payer: Self-pay | Admitting: *Deleted

## 2023-01-14 ENCOUNTER — Telehealth: Payer: Self-pay

## 2023-01-14 NOTE — Telephone Encounter (Signed)
Pt advised patient assistance delivered and ready for pick up.  Novolog 3 boxes Pen needles 5 boxes

## 2023-01-17 NOTE — Telephone Encounter (Signed)
Patient came in to office and picked up 3 boxes of patient assistance Novolog and 5 boxes of patient assistance Pen Needles.

## 2023-01-18 NOTE — Telephone Encounter (Signed)
Patient picked up Insulin Degludec 2 boxes.

## 2023-01-18 NOTE — Telephone Encounter (Signed)
Pt advised patient assistance delivered and ready for pick up.  Insulin Degludec 2 boxes.

## 2023-01-31 ENCOUNTER — Encounter: Payer: Self-pay | Admitting: Internal Medicine

## 2023-01-31 ENCOUNTER — Ambulatory Visit (INDEPENDENT_AMBULATORY_CARE_PROVIDER_SITE_OTHER): Payer: PPO | Admitting: Internal Medicine

## 2023-01-31 VITALS — BP 130/82 | HR 80 | Ht 60.0 in | Wt 121.2 lb

## 2023-01-31 DIAGNOSIS — Z794 Long term (current) use of insulin: Secondary | ICD-10-CM | POA: Diagnosis not present

## 2023-01-31 DIAGNOSIS — E119 Type 2 diabetes mellitus without complications: Secondary | ICD-10-CM

## 2023-01-31 DIAGNOSIS — E785 Hyperlipidemia, unspecified: Secondary | ICD-10-CM | POA: Diagnosis not present

## 2023-01-31 DIAGNOSIS — E1365 Other specified diabetes mellitus with hyperglycemia: Secondary | ICD-10-CM

## 2023-01-31 DIAGNOSIS — Z7985 Long-term (current) use of injectable non-insulin antidiabetic drugs: Secondary | ICD-10-CM

## 2023-01-31 DIAGNOSIS — Z7984 Long term (current) use of oral hypoglycemic drugs: Secondary | ICD-10-CM

## 2023-01-31 DIAGNOSIS — E139 Other specified diabetes mellitus without complications: Secondary | ICD-10-CM

## 2023-01-31 LAB — POCT GLYCOSYLATED HEMOGLOBIN (HGB A1C): Hemoglobin A1C: 7.7 % — AB (ref 4.0–5.6)

## 2023-01-31 NOTE — Progress Notes (Signed)
Patient ID: Nancy Sutton, female   DOB: 10-06-1945, 77 y.o.   MRN: [id number]  HPI: Nancy Sutton is a 77 y.o.-year-old female, returning for follow-up for LADA, dx as DM2 in 2008, insulin-dependent since 2015, uncontrolled, with complications (h/o stroke in 2012). Pt. previously saw Dr. Fransico Him and then Dr. Everardo All, but last visit with me 4 months ago. She is here with her husband who offers part of the history.  Interim history: No increased urination, blurry vision, nausea, chest pain. In 11/2022, she was admitted with vomiting without nausea.  Was found to have an NSTEMI and had 2 DES stents placed.  She developed PAF post MI, which converted to sinus rhythm spontaneously.  Of note, a lipase was normal.  She is still feeling very tired.  Sugars have been slightly higher since her MI.  Reviewed HbA1c: Lab Results  Component Value Date   HGBA1C 7.2 (A) 10/01/2022   HGBA1C 6.9 (A) 05/27/2022   HGBA1C 7.0 (A) 01/21/2022   HGBA1C 7.0 (A) 10/21/2021   HGBA1C 6.7 (A) 05/14/2021   HGBA1C 7.5 (A) 01/21/2021   HGBA1C 8.7 (A) 10/20/2020   HGBA1C 8.0 (H) 07/20/2019   HGBA1C 8.3 (H) 04/17/2019   HGBA1C 9.0 (H) 11/29/2018   Pt is on a regimen of: - Metformin ER 500 mg 1x a day in am - Trulicity 1.5 mg weekly (PAP) - tried the 3 mg >> lost 18 lbs 2/2 diarrhea - Levemir 24 units at bedtime (PAP) >> Levemir 29 units daily - Novolog 8-10 units 3x a day, right before meals (PAP) >> 8-12 units 3x a day, 5-10 min before meals (tried 15 min but this was dropping her blood sugars too low) At bedtime, if sugars are >200, may take 3-4 units of Novolog for correction.  Pt. Tried a CGM (libre) >> allergy to the adhesive; Dexcom was not covered.  She checks her sugars 4x a day and they are: - am: 131-226 >> 114-226, 238 >> 141-223 - 2h after b'fast: n/c - before lunch: 96-245 >> 51, 70, 97-209 >> 118-247 - 2h after lunch: n/c - before dinner: 130-241 >> 76-229, 277 >> 90-256 - 2h after dinner: n/c -  bedtime: 88-251 >> 95, 123-249 >> 161-246 - nighttime: n/c Lowest sugar was 27 >> ...88 >> 51 >> 83; she has hypoglycemia awareness at 70.  Highest sugar was 251 >> 366>> 256.  Glucometer: One Touch ultra  - no CKD, last BUN/creatinine:  Lab Results  Component Value Date   BUN 16 12/11/2022   BUN 16 12/10/2022   CREATININE 0.75 12/11/2022   CREATININE 0.73 12/10/2022  She is off losartan 25 mg daily.  Patient had labs with PCP in 01/2022.  I do not have these records.    - + HL; last set of lipids available for review: Lab Results  Component Value Date   CHOL 260 (H) 10/11/2016   HDL 96 10/11/2016   LDLCALC 144 (H) 10/11/2016   TRIG 98 10/11/2016   CHOLHDL 2.7 10/11/2016  She is on Zocor 80 mg daily, Zetia 10 mg daily.  - last eye exam was in 06/2022. No DR reportedly. [employer]. Glaucoma suspect.  - no numbness and tingling in her feet.  Last foot exam 10/01/2022.  She has a history of HTN, OA, GERD.  ROS: + see HPI  Past Medical History:  Diagnosis Date   Arthritis    Diabetes mellitus    since 2008   Essential hypertension, benign 09/10/2015  GERD (gastroesophageal reflux disease)    Hypercholesteremia    Hyperlipidemia    Stroke (HCC) 03/2011   only deficits-affected eyesight   Past Surgical History:  Procedure Laterality Date   BIOPSY  07/02/2022   Procedure: BIOPSY;  Surgeon: Dolores Frame, MD;  Location: [facility];  Service: Gastroenterology;;   CATARACT EXTRACTION W/PHACO  11/01/2011   Procedure: CATARACT EXTRACTION PHACO AND INTRAOCULAR LENS PLACEMENT (IOC);  Surgeon: Gemma Payor, MD;  Location: [facility];  Service: Ophthalmology;  Laterality: Right;  CDE 12.48   CORONARY STENT INTERVENTION N/A 12/10/2022   Procedure: CORONARY STENT INTERVENTION;  Surgeon: Tonny Bollman, MD;  Location: [facility] INVASIVE CV LAB;  Service: Cardiovascular;  Laterality: N/A;   ESOPHAGEAL DILATION N/A 04/29/2017   Procedure: ESOPHAGEAL DILATION;   Surgeon: Malissa Hippo, MD;  Location: [facility];  Service: Endoscopy;  Laterality: N/A;   ESOPHAGOGASTRODUODENOSCOPY N/A 04/29/2017   Procedure: ESOPHAGOGASTRODUODENOSCOPY (EGD);  Surgeon: Malissa Hippo, MD;  Location: [facility];  Service: Endoscopy;  Laterality: N/A;  1015   ESOPHAGOGASTRODUODENOSCOPY (EGD) WITH ESOPHAGEAL DILATION N/A 02/14/2013   Procedure: ESOPHAGOGASTRODUODENOSCOPY (EGD) WITH ESOPHAGEAL DILATION;  Surgeon: Malissa Hippo, MD;  [facility]: [facility];  Service: Endoscopy;  Laterality: N/A;  200-moved to 255 Ann to notify pt   ESOPHAGOGASTRODUODENOSCOPY (EGD) WITH PROPOFOL N/A 05/28/2022   Procedure: ESOPHAGOGASTRODUODENOSCOPY (EGD) WITH PROPOFOL;  Surgeon: Dolores Frame, MD;  [facility];  Service: Gastroenterology;  Laterality: N/A;  1030 ASA 2   ESOPHAGOGASTRODUODENOSCOPY (EGD) WITH PROPOFOL N/A 07/02/2022   Procedure: ESOPHAGOGASTRODUODENOSCOPY (EGD) WITH PROPOFOL;  Surgeon: Dolores Frame, MD;  [facility]: [facility];  Service: Gastroenterology;  Laterality: N/A;  105 ASA 2, pt knows new time per Soledad Gerlach   EYE SURGERY  10-2010   left eye removal of cataract   LEFT HEART CATH AND CORONARY ANGIOGRAPHY N/A 12/10/2022   Procedure: LEFT HEART CATH AND CORONARY ANGIOGRAPHY;  Surgeon: Tonny Bollman, MD;  Location: [facility] INVASIVE CV LAB;  Service: Cardiovascular;  Laterality: N/A;   TUBAL LIGATION  1984   Social History   Socioeconomic History   Marital status: Married    Spouse name: wayne   Number of children: 2   Years of education: 12   Highest education level: Not on file  Occupational History   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   Patient is right handed.   Patient has hs education.   Patient drinks 3-5 glasses of tea daily.   Social Determinants of Health   Financial Resource Strain: Not on file  Food Insecurity: No Food Insecurity (12/10/2022)   Hunger Vital Sign    Worried About Running Out of Food in the Last Year: Never true    Ran Out of Food in the Last Year: Never true  Transportation Needs: No Transportation Needs (12/10/2022)   PRAPARE - Administrator, Civil Service (Medical): No    Lack of Transportation (Non-Medical): No  Physical Activity: Not on file  Stress: Not on file  Social Connections: Not on file  Intimate Partner Violence: Not At Risk (12/10/2022)   Humiliation, Afraid, Rape, and Kick questionnaire    Fear of Current or Ex-Partner: No    Emotionally Abused: No    Physically Abused: No    Sexually Abused: No   Current Outpatient Medications on File Prior to Visit  Medication Sig Dispense Refill   aspirin EC 81 MG tablet Take 1 tablet (81 mg total) by mouth daily. Swallow whole. 30 tablet 12   atorvastatin (LIPITOR) 40 MG tablet Take 1 tablet (40 mg total) by mouth daily. 90 tablet 3   Blood Glucose Monitoring Suppl (ONE TOUCH ULTRA 2) w/Device KIT : Use as instructed 4 x daily 1 kit 0   calcium carbonate (TUMS - DOSED IN MG ELEMENTAL CALCIUM) 500 MG chewable tablet Chew 1 tablet by mouth at bedtime as needed for indigestion or heartburn.     cholecalciferol (VITAMIN D3) 25 MCG (1000 UNIT) tablet Take 1,000 Units by mouth daily.     clopidogrel (PLAVIX) 75 MG tablet Take 1 tablet (75 mg total) by mouth daily. 30 tablet 11   diltiazem (CARDIZEM CD) 120 MG 24 hr capsule Take 1 capsule (120 mg total) by mouth daily. 90 capsule 3   Dulaglutide (TRULICITY) 0.75 MG/0.5ML SOPN Inject 0.75 mg into the skin once a week. 6 mL 3   ezetimibe (ZETIA) 10 MG tablet  Take 10 mg by mouth at bedtime.     GLOBAL EASE INJECT PEN NEEDLES 32G X 4 MM MISC USE 4 PENTIPS EVERY DAY 100 each 5   GLOBAL INJECT EASE LANCETS 30G MISC DIABETIC CLUB 100 each 12   Glucagon 3 MG/DOSE POWD Take 1 tablet by mouth as needed (Blood Sugar levels).     insulin aspart (NOVOLOG FLEXPEN) 100 UNIT/ML FlexPen Inject 10-12 Units into the skin 3 (three) times daily with meals. 30 mL 3   insulin detemir (LEVEMIR) 100 UNIT/ML FlexPen Inject 27 Units into the skin at bedtime. 30 mL 3   latanoprost (XALATAN) 0.005 % ophthalmic solution Place 1 drop into both eyes at bedtime.  11   metFORMIN (GLUCOPHAGE-XR) 500 MG 24 hr tablet Take 1 tablet (500 mg total) by mouth daily with breakfast. 90 tablet 1   Multiple Vitamin (ONE-A-DAY 55 PLUS PO) Take 1 tablet by mouth daily.      Multiple Vitamins-Minerals (HAIR SKIN AND NAILS FORMULA) TABS Take 1 tablet by mouth daily.     Omega-3 Fatty Acids (FISH OIL) 1000 MG CAPS Take 1,000 mg by mouth daily.     omeprazole (PRILOSEC) 40 MG capsule TAKE 1 CAPSULE BY MOUTH DAILY 90 capsule 2   ONETOUCH ULTRA test strip test blood sugar FOUR TIMES DAILY 400 strip 12   timolol (BETIMOL) 0.5 % ophthalmic solution Place 1 drop into both eyes 2 (two) times daily.     vitamin C (ASCORBIC ACID) 500 MG tablet Take 500 mg by mouth daily.     vitamin E 180 MG (400 UNITS) capsule Take 400 Units by mouth daily.     No current facility-administered medications on file prior to visit.   Allergies  Allergen Reactions   Ampicillin Hives    Breaks out in bumps Has patient had a PCN reaction causing immediate rash, facial/tongue/throat swelling, SOB or lightheadedness with hypotension: No Has patient had a PCN reaction causing severe rash involving mucus membranes or skin necrosis: Yes Has patient had a PCN reaction that required hospitalization: No Has  patient had a PCN reaction occurring within the last 10 years: No If all of the above answers are "NO", then may proceed  with Cephalosporin use.    Bromday [Bromfenac Sodium] Itching and Swelling    Eye Drops   Lisinopril Cough   Tetanus Toxoids Hives    Breaks out in bumps   Family History  Problem Relation Age of Onset   Cancer Mother    Diabetes Mother    Cancer Father    Diabetes Father    Anesthesia problems Neg Hx    Hypotension Neg Hx    Malignant hyperthermia Neg Hx    Pseudochol deficiency Neg Hx    PE: BP 130/82 (BP Location: Left Arm, Patient Position: Sitting, Cuff Size: Normal)   Pulse 80   Ht 5' (1.524 m)   Wt 121 lb 3.2 oz (55 kg)   SpO2 99%   BMI 23.67 kg/m  Wt Readings from Last 3 Encounters:  01/31/23 121 lb 3.2 oz (55 kg)  12/27/22 122 lb 12.8 oz (55.7 kg)  12/10/22 125 lb 12.7 oz (57.1 kg)   Constitutional: normal weight, in NAD Eyes: no exophthalmos ENT: no thyromegaly, no cervical lymphadenopathy Cardiovascular: RRR, No MRG Respiratory: CTA B Musculoskeletal: no deformities Skin: no rashes Neurological: no tremor with outstretched hands  ASSESSMENT: 1. LADA, insulin-dependent, uncontrolled, with complications - Cerebrovascular disease - h/o stroke (affecting eyesight only)  Component     Latest Ref Rng 05/27/2022  Glucose     65 - 99 mg/dL 161 (H)   Hemoglobin W9U     4.0 - 5.6 % 6.9 !   Islet Cell Ab     Neg:<1:1  Negative   C-Peptide     0.80 - 3.85 ng/mL 1.85   ZNT8 Antibodies     <15 U/mL <10   Glutamic Acid Decarb Ab     <5 IU/mL >250 (H)    Normal insulin production, but elevated GAD Ab's.  These labs confirm LADA, which explains her blood sugar variability.   2. HL  PLAN:  1. Patient with longstanding, uncontrolled, diabetes, which was diagnosed as LADA when GAD antibodies returned undetectably high in 05/2022.  Present reduction was adequate.  At last visit, reviewing her blood sugars at home per her excellent log, they remained fluctuating, usually above target.  She was not able to take NovoLog 15 minutes before the meals, but only 5 to 10  minutes before as she felt that her sugars were dropping too low when taking it too long before the meal.  We continued this and switched from Levemir to [place] (now through the patient assistance program), and I advised him to take a slightly higher dose of NovoLog.  I also advised her that she could take 3 to 4 units for correction of sugars higher than 200 at bedtime.  We continued metformin and Trulicity at the current doses.  Of note, she could not tolerate the 3 mg dose of Trulicity due to diarrhea.  HbA1c at last visit was 7.2%. -At today's visit, sugars are quite high, mostly in the 200s.  She tells me that she is afraid to increase the insulin by herself.  We discussed about how to do this slowly, titrating the dose based on the sugars obtained before next meal or at bedtime.  For now, I advised him to increase NovoLog from 11-12 units to 12-15 units before meals.  I feel that the Levemir dose is adequate and we discussed that when switching to  Evaristo Bury, she may get a slightly stronger effect of the same dose. - I suggested to:  Patient Instructions  Please continue: - Metformin ER 500 mg 1x a day - Trulicity 1.5 mg weekly - Levemir/Tresiba 29 units at bedtime  Increase: - Novolog 12-15 units 3x a day, 10 min before meals At bedtime, if sugars are >200, may take 3-4 units of Novolog for correction.  Please return in 3-4 months with your sugar log.   - we checked her HbA1c: 7.7% (higher) - advised to check sugars at different times of the day - 4x a day, rotating check times - advised for yearly eye exams >> she is UTD - return to clinic in 3-4 months   2. HL -Reviewed lipid panel from 09/2016: LDL above target, otherwise fractions at goal: Lab Results  Component Value Date   CHOL 260 (H) 10/11/2016   HDL 96 10/11/2016   LDLCALC 144 (H) 10/11/2016   TRIG 98 10/11/2016   CHOLHDL 2.7 10/11/2016  -She can use on Zocor 40 mg daily and Zetia 10 mg daily without side effects - she had  another lipid panel with Dr. Sherwood Gambler in 01/2022 -I do not have the records.  Patient has another appointment in 04/2023.  I advised her to have Dr. Sherwood Gambler send her a copy of the labs, which she can bring them in next visit  Carlus Pavlov, MD PhD [facility]

## 2023-01-31 NOTE — Patient Instructions (Addendum)
Please continue: - Metformin ER 500 mg 1x a day - Trulicity 1.5 mg weekly - Levemir/Tresiba 29 units at bedtime  Increase: - Novolog 12-15 units 3x a day, 10 min before meals At bedtime, if sugars are >200, may take 3-4 units of Novolog for correction.  Please return in 3-4 months with your sugar log.

## 2023-02-01 ENCOUNTER — Telehealth: Payer: Self-pay

## 2023-02-01 DIAGNOSIS — E139 Other specified diabetes mellitus without complications: Secondary | ICD-10-CM

## 2023-02-01 DIAGNOSIS — H401131 Primary open-angle glaucoma, bilateral, mild stage: Secondary | ICD-10-CM | POA: Diagnosis not present

## 2023-02-01 MED ORDER — TRULICITY 1.5 MG/0.5ML ~~LOC~~ SOAJ
1.5000 mg | SUBCUTANEOUS | 3 refills | Status: DC
Start: 2023-02-01 — End: 2023-07-26

## 2023-02-01 NOTE — Telephone Encounter (Signed)
Pt contacted [facility] and they have the 1.5 mg dose of Trulicity available. Requested rx be sent in.

## 2023-02-07 DIAGNOSIS — I480 Paroxysmal atrial fibrillation: Secondary | ICD-10-CM | POA: Diagnosis not present

## 2023-02-07 DIAGNOSIS — I214 Non-ST elevation (NSTEMI) myocardial infarction: Secondary | ICD-10-CM | POA: Diagnosis not present

## 2023-02-08 LAB — COMPREHENSIVE METABOLIC PANEL
ALT: 18 IU/L (ref 0–32)
AST: 19 IU/L (ref 0–40)
Albumin/Globulin Ratio: 1.5 (ref 1.2–2.2)
Albumin: 4.1 g/dL (ref 3.8–4.8)
Alkaline Phosphatase: 98 IU/L (ref 44–121)
BUN/Creatinine Ratio: 28 (ref 12–28)
BUN: 25 mg/dL (ref 8–27)
Bilirubin Total: 0.5 mg/dL (ref 0.0–1.2)
CO2: 24 mmol/L (ref 20–29)
Calcium: 9.8 mg/dL (ref 8.7–10.3)
Chloride: 101 mmol/L (ref 96–106)
Creatinine, Ser: 0.89 mg/dL (ref 0.57–1.00)
Globulin, Total: 2.7 g/dL (ref 1.5–4.5)
Glucose: 293 mg/dL — ABNORMAL HIGH (ref 70–99)
Potassium: 4.5 mmol/L (ref 3.5–5.2)
Sodium: 137 mmol/L (ref 134–144)
Total Protein: 6.8 g/dL (ref 6.0–8.5)
eGFR: 67 mL/min/{1.73_m2} (ref >59)

## 2023-02-08 LAB — LIPID PANEL
Chol/HDL Ratio: 2.2 ratio (ref 0.0–4.4)
Cholesterol, Total: 157 mg/dL (ref 100–199)
HDL: 72 mg/dL (ref >39)
LDL Chol Calc (NIH): 62 mg/dL (ref 0–99)
Triglycerides: 136 mg/dL (ref 0–149)
VLDL Cholesterol Cal: 23 mg/dL (ref 5–40)

## 2023-02-09 ENCOUNTER — Telehealth: Payer: Self-pay | Admitting: *Deleted

## 2023-02-09 MED ORDER — ATORVASTATIN CALCIUM 80 MG PO TABS: 80.0000 mg | ORAL_TABLET | Freq: Every day | ORAL | 3 refills | Status: DC

## 2023-02-09 NOTE — Telephone Encounter (Signed)
-----   Message from Beatrice Lecher, [place] sent at 02/08/2023  6:07 PM EDT ----- Glucose elevated.  Creatinine, K+, LFTs normal.  LDL is slightly above goal at 62.  Goal LDL is less than 55. PLAN:  -Increase atorvastatin to 80 mg daily -Lipids, LFTs in 8 weeks Tereso Newcomer, PA-C    02/08/2023 6:05 PM

## 2023-03-28 ENCOUNTER — Encounter: Payer: Self-pay | Admitting: Physician Assistant

## 2023-03-28 DIAGNOSIS — I2510 Atherosclerotic heart disease of native coronary artery without angina pectoris: Secondary | ICD-10-CM

## 2023-03-28 HISTORY — DX: Atherosclerotic heart disease of native coronary artery without angina pectoris: I25.10

## 2023-03-28 NOTE — Progress Notes (Unsigned)
Cardiology Office Note:    Date:  03/29/2023  ID:  Nancy Sutton, DOB [birth year], MRN [id number] PCP: Elfredia Nevins, MD  [facility] Providers Cardiologist:  Christell Constant, MD       Patient Profile:      Coronary artery disease  NSTEMI 11/2022 s/p 3 x 16 mm DES to the [facility], 3 x 20 mm DES to the [facility] LHC 12/13/2022: RCA ostial 95, mid 90; LCx mid 70; LAD mid 40, distal 70>> Med Rx for mod severe dLAD, mod mLCx TTE 12/09/2022: EF 50-55, no RWMA, mild concentric LVH, GR 2 DD, normal RVSF, mild MR, RAP 15 Paroxysmal atrial fibrillation  Post MI 11/2022 >> spontaneous conv to NSR - pending monitor  Intol of beta-blocker (fatigue) >> Diltiazem  Monitor 12/2022: no AFib  Hx of CVA (residual L hemianopsia) Hypertension  Hyperlipidemia  Diabetes mellitus, on insulin Barrett's Esophagus        History of Present Illness:   Nancy Sutton is a 77 y.o. female who returns for follow-up of CAD.  She was last seen in April 2024 for posthospitalization follow-up after non-STEMI.  She had developed atrial fibrillation post MI but converted to NSR prior to discharge.  A follow-up monitor was resulted after her last visit.  This demonstrated no further atrial fibrillation.  Plan was to not start anticoagulation if she had no further atrial fibrillation on her monitor.  She is here with her husband.  She has been doing well without chest discomfort, shortness of breath, syncope, orthopnea, leg edema.  She rides a stationary bike several days a week.  She also goes for long walks at [employer].  Review of Systems  Gastrointestinal:  Negative for hematochezia and melena.  Genitourinary:  Negative for hematuria.   See HPI    Studies Reviewed:       Risk Assessment/Calculations:     HYPERTENSION CONTROL Vitals:   03/29/23 1042 03/29/23 1126  BP: (!) 140/60 (!) 144/70    The patient's blood pressure is elevated above target today.  In order to address the patient's elevated  BP: The blood pressure is usually elevated in clinic.  Blood pressures monitored at home have been optimal.       Physical Exam:   VS:  BP (!) 144/70   Pulse 90   Ht 5' (1.524 m)   Wt 120 lb 6.4 oz (54.6 kg)   SpO2 98%   BMI 23.51 kg/m    Wt Readings from Last 3 Encounters:  03/29/23 120 lb 6.4 oz (54.6 kg)  01/31/23 121 lb 3.2 oz (55 kg)  12/27/22 122 lb 12.8 oz (55.7 kg)    Constitutional:      Appearance: Healthy appearance. Not in distress.  Neck:     Vascular: No JVR. JVD normal.  Pulmonary:     Breath sounds: Normal breath sounds. No wheezing. No rales.  Cardiovascular:     Normal rate. Regular rhythm.     Murmurs: There is no murmur.  Edema:    Peripheral edema absent.  Abdominal:     Palpations: Abdomen is soft.      ASSESSMENT AND PLAN:   CAD (coronary artery disease) Status post non-STEMI in March 2024 treated a DES to the RCA x 2.  She has residual disease in the LCx and LAD which are treated medically.  She is doing well without anginal symptoms.  She could not tolerate beta-blocker therapy and is currently on diltiazem.  She is tolerating  this well.  She decided not to pursue cardiac rehab.  She is exercising on her own without difficulty.  We discussed the importance of remaining on dual antiplatelet therapy for 12 months post MI. Continue ASA 81 mg daily, clopidogrel 75 mg daily, Lipitor 80 mg daily Follow-up in March 2024  Essential hypertension, benign Blood pressure is always elevated in clinic.  Her blood pressures at home are optimal.  Continue diltiazem 120 mg daily.  Paroxysmal atrial fibrillation (HCC) Follow up monitor demonstrated no further atrial fibrillation.  Therefore, she did not require anticoagulation.  Continue diltiazem 120 mg daily.  Hyperlipidemia Goal LDL <55.  Continue Lipitor 80 mg daily, Zetia 10 mg daily.  Obtain fasting lipids, CMET today.    Dispo:  Return in about 8 months (around 11/27/2023) for Routine Follow Up with Dr.  Raynelle Jan.  Signed, Tereso Newcomer, PA-C

## 2023-03-29 ENCOUNTER — Encounter: Payer: Self-pay | Admitting: Physician Assistant

## 2023-03-29 ENCOUNTER — Ambulatory Visit: Payer: PPO | Attending: Physician Assistant | Admitting: Physician Assistant

## 2023-03-29 VITALS — BP 144/70 | HR 90 | Ht 60.0 in | Wt 120.4 lb

## 2023-03-29 DIAGNOSIS — I2510 Atherosclerotic heart disease of native coronary artery without angina pectoris: Secondary | ICD-10-CM

## 2023-03-29 DIAGNOSIS — E782 Mixed hyperlipidemia: Secondary | ICD-10-CM

## 2023-03-29 DIAGNOSIS — I10 Essential (primary) hypertension: Secondary | ICD-10-CM | POA: Diagnosis not present

## 2023-03-29 DIAGNOSIS — I480 Paroxysmal atrial fibrillation: Secondary | ICD-10-CM

## 2023-03-29 NOTE — Patient Instructions (Signed)
Medication Instructions:  Your physician recommends that you continue on your current medications as directed. Please refer to the Current Medication list given to you today.  *If you need a refill on your cardiac medications before your next appointment, please call your pharmacy*   Lab Work: CMET and Lipids today.  If you have labs (blood work) drawn today and your tests are completely normal, you will receive your results only by: MyChart Message (if you have MyChart) OR A paper copy in the mail If you have any lab test that is abnormal or we need to change your treatment, we will call you to review the results.   Testing/Procedures: None   Follow-Up: At [facility], you and your health needs are our priority.  As part of our continuing mission to provide you with exceptional heart care, we have created designated Provider Care Teams.  These Care Teams include your primary Cardiologist (physician) and Advanced Practice Providers (APPs -  Physician Assistants and Nurse Practitioners) who all work together to provide you with the care you need, when you need it.  We recommend signing up for the patient portal called "MyChart".  Sign up information is provided on this After Visit Summary.  MyChart is used to connect with patients for Virtual Visits (Telemedicine).  Patients are able to view lab/test results, encounter notes, upcoming appointments, etc.  Non-urgent messages can be sent to your provider as well.   To learn more about what you can do with MyChart, go to [email or web address].    Your next appointment:   8 month(s)  Provider:   Christell Constant, MD

## 2023-03-29 NOTE — Assessment & Plan Note (Signed)
Status post non-STEMI in March 2024 treated a DES to the RCA x 2.  She has residual disease in the LCx and LAD which are treated medically.  She is doing well without anginal symptoms.  She could not tolerate beta-blocker therapy and is currently on diltiazem.  She is tolerating this well.  She decided not to pursue cardiac rehab.  She is exercising on her own without difficulty.  We discussed the importance of remaining on dual antiplatelet therapy for 12 months post MI. Continue ASA 81 mg daily, clopidogrel 75 mg daily, Lipitor 80 mg daily Follow-up in March 2024

## 2023-03-29 NOTE — Assessment & Plan Note (Signed)
Blood pressure is always elevated in clinic.  Her blood pressures at home are optimal.  Continue diltiazem 120 mg daily.

## 2023-03-29 NOTE — Assessment & Plan Note (Signed)
Follow up monitor demonstrated no further atrial fibrillation.  Therefore, she did not require anticoagulation.  Continue diltiazem 120 mg daily.

## 2023-03-29 NOTE — Assessment & Plan Note (Signed)
Goal LDL <55.  Continue Lipitor 80 mg daily, Zetia 10 mg daily.  Obtain fasting lipids, CMET today.

## 2023-03-30 LAB — COMPREHENSIVE METABOLIC PANEL
ALT: 17 IU/L (ref 0–32)
AST: 19 IU/L (ref 0–40)
Albumin: 4.1 g/dL (ref 3.8–4.8)
Alkaline Phosphatase: 83 IU/L (ref 44–121)
BUN/Creatinine Ratio: 22 (ref 12–28)
BUN: 19 mg/dL (ref 8–27)
Bilirubin Total: 0.5 mg/dL (ref 0.0–1.2)
CO2: 24 mmol/L (ref 20–29)
Calcium: 10.3 mg/dL (ref 8.7–10.3)
Chloride: 103 mmol/L (ref 96–106)
Creatinine, Ser: 0.87 mg/dL (ref 0.57–1.00)
Globulin, Total: 2.8 g/dL (ref 1.5–4.5)
Glucose: 195 mg/dL — ABNORMAL HIGH (ref 70–99)
Potassium: 4.5 mmol/L (ref 3.5–5.2)
Sodium: 143 mmol/L (ref 134–144)
Total Protein: 6.9 g/dL (ref 6.0–8.5)
eGFR: 69 mL/min/{1.73_m2} (ref >59)

## 2023-03-30 LAB — LIPID PANEL
Chol/HDL Ratio: 2.2 ratio (ref 0.0–4.4)
Cholesterol, Total: 160 mg/dL (ref 100–199)
HDL: 73 mg/dL (ref >39)
LDL Chol Calc (NIH): 65 mg/dL (ref 0–99)
Triglycerides: 127 mg/dL (ref 0–149)
VLDL Cholesterol Cal: 22 mg/dL (ref 5–40)

## 2023-03-31 ENCOUNTER — Telehealth: Payer: Self-pay | Admitting: *Deleted

## 2023-03-31 DIAGNOSIS — E782 Mixed hyperlipidemia: Secondary | ICD-10-CM

## 2023-03-31 NOTE — Telephone Encounter (Signed)
-----   Message from Tereso Newcomer sent at 03/30/2023 10:18 PM EDT ----- Results sent to Nancy Sutton via MyChart. See MyChart comments below. PLAN:  -Refer to [facility] for PCSK9 inhib. Dx: hyperlipidemia; LDL above goal  Nancy Sutton  Your glucose is elevated. Your creatinine (kidney function), potassium, ALT (liver enzyme) are normal. Your LDL cholesterol has not changed since we increased your Atorvastatin to 80 mg once daily. It is still above goal. Continue current medications. I will refer you to our Lipid Clinic to look into alternative treatments for cholesterol.  Tereso Newcomer, PA-C

## 2023-04-01 ENCOUNTER — Other Ambulatory Visit: Payer: Self-pay | Admitting: Endocrinology

## 2023-04-01 DIAGNOSIS — E1059 Type 1 diabetes mellitus with other circulatory complications: Secondary | ICD-10-CM

## 2023-04-08 ENCOUNTER — Telehealth: Payer: Self-pay | Admitting: Internal Medicine

## 2023-04-08 NOTE — Telephone Encounter (Signed)
Called pt in regards to dizziness.  Reports started Wednesday 04/06/23 afternoon was walking in bedroom became very dizzy and swimmy headed. Checked Blood sugar was normal.  BP 109/44-87, 102/46-93. On Thursday 04/07/23 was not swimmy headed but a little dizzy. 101/45-89, 115/48-92 Today 04/08/23 feels dizzy if moves to fast 111/43-82.  Reports drinks plenty of fluids mainly tea, milk and kool aid.   Also reports PCP stopped BP med previously d/t low BP.  Was started on Diltiazem 120 mg and has had low BP since this time.  Advised will send to provider to address.

## 2023-04-08 NOTE — Telephone Encounter (Signed)
Called pt to advise of providers recommendations:  DC Diltiazem CD 120 mg once daily Start Diltiazem 30 mg every 6-8 hours (three times a day). Let us know if dizziness, low BP does not improve with this. Tereso Newcomer, PA-C    04/08/2023 12:40 PM     Pt reports does not want to take medication 3 times daily. Reports it will have her taking medication at 4 am.  Reports will take Diltiazem 60 mg (1/2 pill) daily until Monday when provider responds to concern. Advised to monitor BP and dizziness.  Pt is agreeable no further concerns at this time.

## 2023-04-08 NOTE — Telephone Encounter (Signed)
DC Diltiazem CD 120 mg once daily Start Diltiazem 30 mg every 6-8 hours (three times a day). Let us know if dizziness, low BP does not improve with this. Tereso Newcomer, PA-C    04/08/2023 12:40 PM

## 2023-04-08 NOTE — Telephone Encounter (Signed)
Patient returned staff call. 

## 2023-04-08 NOTE — Telephone Encounter (Signed)
STAT if patient feels like he/she is going to faint   Are you dizzy now? Slightly   Do you feel faint or have you passed out? No  Do you have any other symptoms? No other symptoms   Have you checked your HR and BP (record if available)? HR- 91 BP- 111/55

## 2023-04-08 NOTE — Telephone Encounter (Signed)
Call placed to pt regarding message below.  Left a message for pt to call back.  

## 2023-04-08 NOTE — Telephone Encounter (Signed)
Left a message to call back.

## 2023-04-09 MED ORDER — DILTIAZEM HCL 30 MG PO TABS: 30.0000 mg | ORAL_TABLET | Freq: Two times a day (BID) | ORAL | 11 refills | Status: DC

## 2023-04-09 NOTE — Telephone Encounter (Signed)
Sustained release meds should not be broken in half. I called the patient today. Will try Diltiazem 30 mg twice daily instead. PLAN: -Rx for Diltiazem 30 mg twice daily sent to pharmacy Tereso Newcomer, PA-C    04/09/2023 1:06 PM

## 2023-04-15 ENCOUNTER — Telehealth: Payer: Self-pay | Admitting: Internal Medicine

## 2023-04-15 ENCOUNTER — Telehealth: Payer: Self-pay

## 2023-04-15 NOTE — Telephone Encounter (Signed)
Pt is needing help with getting a code sent over to her insurance so they will pay for her to see the pharmacist. Please advise.

## 2023-04-15 NOTE — Telephone Encounter (Signed)
Spoke with pt's husband, DPR who states pt is in the shower.  Advised husband og CPT code to provide to insurance company.  Pt's husband verbalizes understanding and thanked Charity fundraiser for the phone call.

## 2023-04-15 NOTE — Telephone Encounter (Deleted)
I21.4, I21.5, and/or E78.5

## 2023-04-15 NOTE — Telephone Encounter (Signed)
Pt would ike a callback regarding upcoming appt. With Pharmacist. Please advise

## 2023-04-15 NOTE — Telephone Encounter (Signed)
I called and spoke to Mrs Nogales and she is aware that her patient assistance has arrived # 2 boxes of the Insulin Degludec

## 2023-04-15 NOTE — Telephone Encounter (Signed)
Patient would like to know if her insurance will cover her appointment with PharmD. Advised that she will need to talk with billing department and her insurance company.

## 2023-04-18 NOTE — Telephone Encounter (Signed)
Patient came in to office today and picked up 2 boxes of patient assistance Insulin Degludec (Trulicity).

## 2023-04-19 NOTE — Telephone Encounter (Signed)
Nancy Sutton, CMA  ?

## 2023-04-20 ENCOUNTER — Telehealth: Payer: Self-pay

## 2023-04-20 NOTE — Telephone Encounter (Signed)
Called and left detailed message that her Novolog FlexPens #3 and her NovoFine 32G Tip is ready to be picked up

## 2023-04-22 NOTE — Telephone Encounter (Signed)
Patient came in office today and picked up 3 boxes of patient assistance Novolog Flex Pens and  5 boxes of patient assistance NovoFine 32G Tip.

## 2023-04-25 ENCOUNTER — Ambulatory Visit (INDEPENDENT_AMBULATORY_CARE_PROVIDER_SITE_OTHER): Payer: PPO | Admitting: Gastroenterology

## 2023-04-25 ENCOUNTER — Encounter (INDEPENDENT_AMBULATORY_CARE_PROVIDER_SITE_OTHER): Payer: Self-pay | Admitting: Gastroenterology

## 2023-04-25 VITALS — BP 153/78 | HR 97 | Temp 97.5°F | Ht 60.0 in | Wt 121.6 lb

## 2023-04-25 DIAGNOSIS — K219 Gastro-esophageal reflux disease without esophagitis: Secondary | ICD-10-CM

## 2023-04-25 DIAGNOSIS — K2270 Barrett's esophagus without dysplasia: Secondary | ICD-10-CM

## 2023-04-25 MED ORDER — OMEPRAZOLE 20 MG PO CPDR
20.0000 mg | DELAYED_RELEASE_CAPSULE | Freq: Every day | ORAL | 3 refills | Status: DC
Start: 2023-04-25 — End: 2023-05-02

## 2023-04-25 NOTE — Patient Instructions (Signed)
Decrease omeprazole to 20 mg qday Repeat EGD in 06/2027

## 2023-04-25 NOTE — Progress Notes (Signed)
Katrinka Blazing, M.D. Gastroenterology & Hepatology [facility] Gastroenterology [address] [place], [place] [address]  Primary Care Physician: Elfredia Nevins, MD [address] [place] [address]  I will communicate my assessment and recommendations to the referring MD via EMR.  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   The patient was last seen on 04/22/2022. At that time, the patient was scheduled for EGD for surveillance of Barrett's esophagus.  She was continued on omeprazole 40 mg every day.  She had an initial EGD on 05/28/2022 that showed a large amount of food in the gastric body, due to this, no biopsies could be performed.  A second EGD was performed on 07/02/2022 which showed short segment Barrett's esophagus between 31 to 32 cm) biopsies consistent with intestinal metaplasia without dysplasia), 4 cm hiatal hernia, normal stomach and duodenum.  Recommend to have repeat EGD in 5 years.  She deneis dysphagia, heartburn or odynophagia. She takes omeprazole 40 mg in the morning, 30 minutes before breakfast. The patient denies having any nausea, vomiting, fever, chills, hematochezia, melena, hematemesis, abdominal distention, abdominal pain, diarrhea, jaundice, pruritus or weight loss.  Patient had an NSTEMI on 11/2022 - 2 DES stents placed in the RCA. She was started on Plavix.  Last EGD:as above Last Colonoscopy: Had negative Cologuard in Jan 2022.   Past Medical History: Past Medical History:   Diagnosis Date   Arthritis    CAD (coronary artery disease) 03/28/2023   NSTEMI 11/2022 s/p 3 x 16 mm DES to the [facility], 3 x 20 mm DES to the mRCA    - LHC 12/13/2022: RCA ostial 95, mid 90; LCx mid 70; LAD mid 40, distal 70>> Med Rx for LAD, LCx   - TTE 12/09/2022: EF 50-55, no RWMA, mild concentric LVH, GR 2 DD, normal RVSF, mild MR, RAP 15   Diabetes mellitus    since 2008   Essential hypertension, benign 09/10/2015   GERD (gastroesophageal reflux disease)    Hypercholesteremia    Hyperlipidemia    Stroke (HCC) 03/21/2011   only deficits-affected eyesight    Past Surgical History: Past Surgical History:  Procedure Laterality Date   BIOPSY  07/02/2022   Procedure: BIOPSY;  Surgeon: Dolores Frame, MD;  [facility]: [facility];  Service: Gastroenterology;;   CATARACT EXTRACTION W/PHACO  11/01/2011   Procedure: CATARACT EXTRACTION PHACO AND INTRAOCULAR LENS PLACEMENT (IOC);  Surgeon: Gemma Payor, MD;  Location: [facility];  Service: Ophthalmology;  Laterality: Right;  CDE 12.48   CORONARY STENT INTERVENTION N/A 12/10/2022   Procedure: CORONARY STENT INTERVENTION;  Surgeon: Tonny Bollman, MD;  Location: [facility] INVASIVE CV LAB;  Service: Cardiovascular;  Laterality: N/A;   ESOPHAGEAL DILATION N/A 04/29/2017   Procedure: ESOPHAGEAL DILATION;  Surgeon: Malissa Hippo, MD;  [facility]: [facility];  Service: Endoscopy;  Laterality: N/A;   ESOPHAGOGASTRODUODENOSCOPY N/A 04/29/2017   Procedure: ESOPHAGOGASTRODUODENOSCOPY (EGD);  Surgeon: Malissa Hippo, MD;  [facility];  Service: Endoscopy;  Laterality: N/A;  1015   ESOPHAGOGASTRODUODENOSCOPY (EGD) WITH ESOPHAGEAL DILATION N/A 02/14/2013   Procedure: ESOPHAGOGASTRODUODENOSCOPY (EGD) WITH ESOPHAGEAL DILATION;  Surgeon:  Malissa Hippo, MD;  [facility];  Service: Endoscopy;  Laterality: N/A;  200-moved to 255 Ann to notify pt   ESOPHAGOGASTRODUODENOSCOPY (EGD) WITH PROPOFOL N/A 05/28/2022   Procedure:  ESOPHAGOGASTRODUODENOSCOPY (EGD) WITH PROPOFOL;  Surgeon: Dolores Frame, MD;  [facility];  Service: Gastroenterology;  Laterality: N/A;  1030 ASA 2   ESOPHAGOGASTRODUODENOSCOPY (EGD) WITH PROPOFOL N/A 07/02/2022   Procedure: ESOPHAGOGASTRODUODENOSCOPY (EGD) WITH PROPOFOL;  Surgeon: Dolores Frame, MD;  [facility];  Service: Gastroenterology;  Laterality: N/A;  105 ASA 2, pt knows new time per Soledad Gerlach   EYE SURGERY  10-2010   left eye removal of cataract   LEFT HEART CATH AND CORONARY ANGIOGRAPHY N/A 12/10/2022   Procedure: LEFT HEART CATH AND CORONARY ANGIOGRAPHY;  Surgeon: Tonny Bollman, MD;  Location: [facility] INVASIVE CV LAB;  Service: Cardiovascular;  Laterality: N/A;   TUBAL LIGATION  1984    Family History: Family History  Problem Relation Age of Onset   Cancer Mother    Diabetes Mother    Cancer Father    Diabetes Father    Anesthesia problems Neg Hx    Hypotension Neg Hx    Malignant hyperthermia Neg Hx    Pseudochol deficiency Neg Hx     Social History: Social History   Tobacco Use  Smoking Status Never  Smokeless Tobacco Never   Social History   Substance and Sexual Activity  Alcohol Use No   Alcohol/week: 0.0 standard drinks of alcohol   Social History   Substance and Sexual Activity  Drug Use No    Allergies: Allergies  Allergen Reactions   Ampicillin Hives    Breaks out in bumps Has patient had a PCN reaction causing immediate rash, facial/tongue/throat swelling, SOB or lightheadedness with hypotension: No Has patient had a PCN reaction causing severe rash involving mucus membranes or skin necrosis: Yes Has patient had a PCN reaction that required hospitalization: No Has patient had a PCN reaction occurring within the last 10 years: No If all of the above answers are "NO", then may proceed with Cephalosporin use.    Bromday [Bromfenac Sodium] Itching and Swelling    Eye Drops   Lisinopril Cough   Tetanus  Toxoids Hives    Breaks out in bumps    Medications: Current Outpatient Medications  Medication Sig Dispense Refill   acetaminophen (TYLENOL) 650 MG CR tablet Take 650 mg by mouth every 8 (eight) hours as needed for pain.     aspirin EC 81 MG tablet Take 1 tablet (81 mg total) by mouth daily. Swallow whole. 30 tablet 12   atorvastatin (LIPITOR) 40 MG tablet Take 40 mg by mouth daily.     Biotin 10 MG TABS Take by mouth daily at 6 (six) AM.     Blood Glucose Monitoring Suppl (ONE TOUCH ULTRA 2) w/Device KIT : Use as instructed 4 x daily 1 kit 0   calcium carbonate (TUMS - DOSED IN MG ELEMENTAL CALCIUM) 500 MG chewable tablet Chew 1 tablet by mouth at bedtime as needed for indigestion or heartburn.     cholecalciferol (VITAMIN D3) 25 MCG (1000 UNIT) tablet Take 10 mcg by mouth daily.     clopidogrel (PLAVIX) 75 MG tablet Take 1 tablet (75 mg total) by mouth daily. 30 tablet 11   diltiazem (CARDIZEM) 30 MG tablet Take 1 tablet (30 mg total) by mouth 2 (two) times daily. Start on 04/10/23 60 tablet 11   Dulaglutide (TRULICITY) 1.5 MG/0.5ML  SOPN Inject 1.5 mg into the skin once a week. 8 mL 3   ezetimibe (ZETIA) 10 MG tablet Take 10 mg by mouth at bedtime.     GLOBAL EASE INJECT PEN NEEDLES 32G X 4 MM MISC USE 4 PENTIPS EVERY DAY 100 each 5   GLOBAL INJECT EASE LANCETS 30G MISC DIABETIC CLUB 100 each 12   insulin aspart (NOVOLOG FLEXPEN) 100 UNIT/ML FlexPen Inject 10-12 Units into the skin 3 (three) times daily with meals. (Patient taking differently: Inject into the skin 3 (three) times daily with meals. 10-14 units prior to meals) 30 mL 3   insulin detemir (LEVEMIR) 100 UNIT/ML FlexPen Inject 27 Units into the skin at bedtime. (Patient taking differently: Inject 29 Units into the skin at bedtime.) 30 mL 3   latanoprost (XALATAN) 0.005 % ophthalmic solution Place 1 drop into both eyes at bedtime.  11   metFORMIN (GLUCOPHAGE-XR) 500 MG 24 hr tablet Take 1 tablet (500 mg total) by mouth daily with  breakfast. 90 tablet 1   Multiple Vitamin (ONE-A-DAY 55 PLUS PO) Take 1 tablet by mouth daily.      Multiple Vitamins-Minerals (HAIR SKIN AND NAILS FORMULA) TABS Take 1 tablet by mouth daily.     Omega-3 Fatty Acids (FISH OIL) 1000 MG CAPS Take 1,000 mg by mouth daily.     omeprazole (PRILOSEC) 40 MG capsule TAKE 1 CAPSULE BY MOUTH DAILY 90 capsule 2   ONETOUCH ULTRA test strip TEST BLOOD SUGAR FOUR TIMES DAILY 400 strip 11   Probiotic Product (PROBIOTIC DAILY PO) Take 1 tablet by mouth daily.     timolol (BETIMOL) 0.5 % ophthalmic solution Place 1 drop into both eyes 2 (two) times daily.     vitamin C (ASCORBIC ACID) 500 MG tablet Take 500 mg by mouth daily.     vitamin E 180 MG (400 UNITS) capsule Take 400 Units by mouth daily.     No current facility-administered medications for this visit.    Review of Systems: GENERAL: negative for malaise, night sweats HEENT: No changes in hearing or vision, no nose bleeds or other nasal problems. NECK: Negative for lumps, goiter, pain and significant neck swelling RESPIRATORY: Negative for cough, wheezing CARDIOVASCULAR: Negative for chest pain, leg swelling, palpitations, orthopnea GI: SEE HPI MUSCULOSKELETAL: Negative for joint pain or swelling, back pain, and muscle pain. SKIN: Negative for lesions, rash PSYCH: Negative for sleep disturbance, mood disorder and recent psychosocial stressors. HEMATOLOGY Negative for prolonged bleeding, bruising easily, and swollen nodes. ENDOCRINE: Negative for cold or heat intolerance, polyuria, polydipsia and goiter. NEURO: negative for tremor, gait imbalance, syncope and seizures. The remainder of the review of systems is noncontributory.   Physical Exam: BP (!) 153/78 (BP Location: Left Arm, Patient Position: Sitting, Cuff Size: Normal)   Pulse 97   Temp (!) 97.5 F (36.4 C) (Temporal)   Ht 5' (1.524 m)   Wt 121 lb 9.6 oz (55.2 kg)   BMI 23.75 kg/m  GENERAL: The patient is AO x3, in no acute  distress. HEENT: Head is normocephalic and atraumatic. EOMI are intact. Mouth is well hydrated and without lesions. NECK: Supple. No masses LUNGS: Clear to auscultation. No presence of rhonchi/wheezing/rales. Adequate chest expansion HEART: RRR, normal s1 and s2. ABDOMEN: Soft, nontender, no guarding, no peritoneal signs, and nondistended. BS +. No masses. EXTREMITIES: Without any cyanosis, clubbing, rash, lesions or edema. NEUROLOGIC: AOx3, no focal motor deficit. SKIN: no jaundice, no rashes  Imaging/Labs: as above  I personally reviewed and  interpreted the available labs, imaging and endoscopic files.  Impression and Plan: Nancy Sutton is a 77 y.o. 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  Patient has presented improvement of her GERD while taking her PPI daily.  Denies having any complaints.  We will attempt to decrease her omeprazole to lowest dose.  I advised the patient that if her symptoms were to recur she will need to go back to 40 mg dosing.  Patient understood and agreed.  She will need to have a repeat EGD in 2028.  -Decrease omeprazole to 20 mg qday -Repeat EGD in 06/2027  All questions were answered.      Katrinka Blazing, MD Gastroenterology and Hepatology [facility] Gastroenterology

## 2023-04-26 ENCOUNTER — Encounter (INDEPENDENT_AMBULATORY_CARE_PROVIDER_SITE_OTHER): Payer: Self-pay

## 2023-04-26 DIAGNOSIS — M1991 Primary osteoarthritis, unspecified site: Secondary | ICD-10-CM | POA: Diagnosis not present

## 2023-04-26 DIAGNOSIS — E6609 Other obesity due to excess calories: Secondary | ICD-10-CM | POA: Diagnosis not present

## 2023-04-26 DIAGNOSIS — E039 Hypothyroidism, unspecified: Secondary | ICD-10-CM | POA: Diagnosis not present

## 2023-04-26 DIAGNOSIS — E559 Vitamin D deficiency, unspecified: Secondary | ICD-10-CM | POA: Diagnosis not present

## 2023-04-26 DIAGNOSIS — Z1331 Encounter for screening for depression: Secondary | ICD-10-CM | POA: Diagnosis not present

## 2023-04-26 DIAGNOSIS — E1165 Type 2 diabetes mellitus with hyperglycemia: Secondary | ICD-10-CM | POA: Diagnosis not present

## 2023-04-26 DIAGNOSIS — D518 Other vitamin B12 deficiency anemias: Secondary | ICD-10-CM | POA: Diagnosis not present

## 2023-04-26 DIAGNOSIS — E1129 Type 2 diabetes mellitus with other diabetic kidney complication: Secondary | ICD-10-CM | POA: Diagnosis not present

## 2023-04-26 DIAGNOSIS — N182 Chronic kidney disease, stage 2 (mild): Secondary | ICD-10-CM | POA: Diagnosis not present

## 2023-04-26 DIAGNOSIS — K219 Gastro-esophageal reflux disease without esophagitis: Secondary | ICD-10-CM | POA: Diagnosis not present

## 2023-04-26 DIAGNOSIS — I10 Essential (primary) hypertension: Secondary | ICD-10-CM | POA: Diagnosis not present

## 2023-04-26 DIAGNOSIS — Z6836 Body mass index (BMI) 36.0-36.9, adult: Secondary | ICD-10-CM | POA: Diagnosis not present

## 2023-04-26 DIAGNOSIS — Z0001 Encounter for general adult medical examination with abnormal findings: Secondary | ICD-10-CM | POA: Diagnosis not present

## 2023-05-02 ENCOUNTER — Other Ambulatory Visit: Payer: Self-pay | Admitting: Internal Medicine

## 2023-05-02 ENCOUNTER — Telehealth (INDEPENDENT_AMBULATORY_CARE_PROVIDER_SITE_OTHER): Payer: Self-pay | Admitting: *Deleted

## 2023-05-02 ENCOUNTER — Other Ambulatory Visit (INDEPENDENT_AMBULATORY_CARE_PROVIDER_SITE_OTHER): Payer: Self-pay | Admitting: Gastroenterology

## 2023-05-02 DIAGNOSIS — E139 Other specified diabetes mellitus without complications: Secondary | ICD-10-CM

## 2023-05-02 DIAGNOSIS — K2270 Barrett's esophagus without dysplasia: Secondary | ICD-10-CM

## 2023-05-02 DIAGNOSIS — K219 Gastro-esophageal reflux disease without esophagitis: Secondary | ICD-10-CM

## 2023-05-02 MED ORDER — OMEPRAZOLE 40 MG PO CPDR
40.0000 mg | DELAYED_RELEASE_CAPSULE | Freq: Every day | ORAL | 3 refills | Status: DC
Start: 2023-05-02 — End: 2024-03-14

## 2023-05-02 NOTE — Telephone Encounter (Signed)
Discussed with patient per Dr.Castaneda - I have resent the 40 mg dose to her pharmacy. She should keep taking this on a daily basis but can take an extra dose of Pepcid as needed if still presenting burning sensation in her throat.   Patient verbalized understanding.

## 2023-05-02 NOTE — Telephone Encounter (Signed)
Pt seen last week on Monday. Cahnged omeprazole from 40mg  to 20mg . Took 20mg  on Wednesday and Thursday and started having hearburn on Thursday and Friday took 20mg  and was up all night long with acid reflux. Acid in throat and felt like throat was on fire. On Saturday she went back on 40mg  and her throat is still burning.

## 2023-05-02 NOTE — Telephone Encounter (Signed)
Please let her know I have resent the 40 mg dose to her pharmacy.  She should keep taking this on a daily basis but can take an extra dose of Pepcid as needed if still presenting burning sensation in her throat.

## 2023-05-04 ENCOUNTER — Ambulatory Visit: Payer: PPO

## 2023-05-04 ENCOUNTER — Telehealth: Payer: Self-pay | Admitting: Physician Assistant

## 2023-05-04 DIAGNOSIS — E782 Mixed hyperlipidemia: Secondary | ICD-10-CM

## 2023-05-04 NOTE — Telephone Encounter (Signed)
Patient dropped off her latest [facility] test results.  This document will be placed in your mailbox, for your review, today.  Thank you.

## 2023-05-09 ENCOUNTER — Telehealth: Payer: Self-pay | Admitting: Physician Assistant

## 2023-05-09 NOTE — Telephone Encounter (Signed)
Pt dropped off bp readings. The readings will be dropped off in your mail box. Thank you!

## 2023-05-09 NOTE — Telephone Encounter (Signed)
Call placed to pt, she was unaware that she was supposed to be checking her bp, but she does have some, but wasn't at them at the time we spoke on phone.  Pt said she will be in town today and she can just drop them off when she comes.

## 2023-05-10 MED ORDER — ROSUVASTATIN CALCIUM 40 MG PO TABS: 40.0000 mg | ORAL_TABLET | Freq: Every day | ORAL | 3 refills | Status: DC

## 2023-05-10 NOTE — Telephone Encounter (Signed)
Pt aware to stop Atorvastatin Start Rosuvastatin 40 mg daily  She will repeat labs in 3 months 08/12/23

## 2023-05-10 NOTE — Telephone Encounter (Signed)
Spoke with pt.  She is willing to try Rosuvastatin.  What dose?

## 2023-05-10 NOTE — Addendum Note (Signed)
Addended by: Burnetta Sabin on: 05/10/2023 03:09 PM   Modules accepted: Orders

## 2023-05-10 NOTE — Telephone Encounter (Signed)
Reviewed BPs. BP remains optimal. Reviewed labs sent from PCP. LDL still above goal at 63. LDL goal is < 55. Pt is already on max dose Atorvastatin and Zetia. Options for getting LDL < 55 include: -  A. Change Atorvastatin to Rosuvastatin OR -  B. Refer to [facility] for consideration of PCSK9 inhibitor. Please see if she is willing to try either option. Tereso Newcomer, PA-C    05/10/2023 1:24 PM

## 2023-05-10 NOTE — Telephone Encounter (Signed)
PLAN: -DC Atorvastatin -Start Rosuvastatin 40 mg once daily -Lipids, ALT in 3 mos Tereso Newcomer, PA-C    05/10/2023 2:20 PM

## 2023-05-12 ENCOUNTER — Telehealth: Payer: Self-pay | Admitting: Physician Assistant

## 2023-05-12 ENCOUNTER — Other Ambulatory Visit: Payer: Self-pay

## 2023-05-12 DIAGNOSIS — E782 Mixed hyperlipidemia: Secondary | ICD-10-CM

## 2023-05-12 NOTE — Telephone Encounter (Signed)
New Message:       Patient would like for her lab order to be released please. She wants to  go to  [facility] at [employer].

## 2023-05-12 NOTE — Telephone Encounter (Signed)
Returned call to patient, informed her lab orders have already been released to [facility]. She expressed appreciation for assistance.

## 2023-05-12 NOTE — Telephone Encounter (Signed)
Attempted to contact patient, no answer and voicemail not setup.  Lab orders for Lipid panel and ALT released for patient to have drawn at [facility] at [facility] in November 2024.

## 2023-05-12 NOTE — Telephone Encounter (Signed)
Patient is returning call.  °

## 2023-05-18 DIAGNOSIS — U071 COVID-19: Secondary | ICD-10-CM | POA: Diagnosis not present

## 2023-05-18 DIAGNOSIS — Z20822 Contact with and (suspected) exposure to covid-19: Secondary | ICD-10-CM | POA: Diagnosis not present

## 2023-05-25 ENCOUNTER — Encounter: Payer: Self-pay | Admitting: Internal Medicine

## 2023-06-03 ENCOUNTER — Ambulatory Visit: Payer: PPO | Admitting: Internal Medicine

## 2023-06-13 ENCOUNTER — Ambulatory Visit: Payer: PPO | Admitting: Internal Medicine

## 2023-06-23 ENCOUNTER — Telehealth: Payer: Self-pay

## 2023-06-23 NOTE — Telephone Encounter (Signed)
Received patient's patient assistance meds. Received Nancy Sutton x 2. Patient called and made aware.

## 2023-06-24 NOTE — Telephone Encounter (Signed)
Patient picked up [place] patient assistance -  log noted

## 2023-07-01 ENCOUNTER — Telehealth: Payer: Self-pay

## 2023-07-01 DIAGNOSIS — E139 Other specified diabetes mellitus without complications: Secondary | ICD-10-CM

## 2023-07-01 MED ORDER — GLOBAL INJECT EASE LANCETS 30G MISC: 5 refills | Status: DC

## 2023-07-04 NOTE — Telephone Encounter (Signed)
Dr. Reece Agar has been notified

## 2023-07-06 ENCOUNTER — Telehealth: Payer: Self-pay

## 2023-07-06 MED ORDER — LANCETS 30G MISC: 2 refills | Status: DC

## 2023-07-06 NOTE — Telephone Encounter (Signed)
Patient called and left a voicemail stating she needs a refill on lancets. Refill was sent on 07/01/23 but [facility] stated they did not get it. I tried called  [facility] multiple times with no answer. I have sent a generic rx for lancets.   Requested Prescriptions   Signed Prescriptions Disp Refills   Lancets 30G MISC 500 each 2    Sig: Use to check glucose 4-6 times a day    Authorizing Provider: Carlus Pavlov    Ordering User: Pollie Meyer

## 2023-07-18 ENCOUNTER — Telehealth: Payer: Self-pay | Admitting: Internal Medicine

## 2023-07-18 ENCOUNTER — Telehealth: Payer: Self-pay

## 2023-07-18 DIAGNOSIS — E139 Other specified diabetes mellitus without complications: Secondary | ICD-10-CM

## 2023-07-18 NOTE — Telephone Encounter (Signed)
Patient came in to office today and completed the [employer] Patient Chief of Staff and the [employer], Wellsite geologist.  Both applications have financial information attached to them.  Both forms are in Dr. Charlean Sanfilippo folder in the front office.

## 2023-07-18 NOTE — Telephone Encounter (Signed)
Patient Assistance  Medication:Tresiba  Dosage:[id number] Quantity:2 boxes  Supplies: NovoFine Pen Needles  Quantity:5 boxes  [employer]

## 2023-07-26 MED ORDER — TRULICITY 1.5 MG/0.5ML ~~LOC~~ SOAJ: 1.5000 mg | SUBCUTANEOUS | 3 refills | Status: DC

## 2023-07-26 NOTE — Addendum Note (Signed)
Addended by: Pollie Meyer on: 07/26/2023 11:50 AM   Modules accepted: Orders

## 2023-07-26 NOTE — Telephone Encounter (Signed)
Both Applications have been faxed back.

## 2023-07-28 ENCOUNTER — Encounter: Payer: Self-pay | Admitting: Internal Medicine

## 2023-07-28 ENCOUNTER — Ambulatory Visit (INDEPENDENT_AMBULATORY_CARE_PROVIDER_SITE_OTHER): Payer: PPO | Admitting: Internal Medicine

## 2023-07-28 VITALS — BP 138/80 | HR 83 | Ht 60.0 in | Wt 121.6 lb

## 2023-07-28 DIAGNOSIS — E1065 Type 1 diabetes mellitus with hyperglycemia: Secondary | ICD-10-CM

## 2023-07-28 DIAGNOSIS — E139 Other specified diabetes mellitus without complications: Secondary | ICD-10-CM

## 2023-07-28 DIAGNOSIS — E785 Hyperlipidemia, unspecified: Secondary | ICD-10-CM

## 2023-07-28 LAB — POCT GLYCOSYLATED HEMOGLOBIN (HGB A1C): Hemoglobin A1C: 7.7 % — AB (ref 4.0–5.6)

## 2023-07-28 LAB — MICROALBUMIN / CREATININE URINE RATIO
Creatinine,U: 134.6 mg/dL
Microalb Creat Ratio: 1.7 mg/g (ref 0.0–30.0)
Microalb, Ur: 2.3 mg/dL — ABNORMAL HIGH (ref 0.0–1.9)

## 2023-07-28 NOTE — Progress Notes (Signed)
Patient ID: Nancy Sutton, female   DOB: October 01, 1945, 77 y.o.   MRN: [id number]  HPI: Nancy Sutton is a 77 y.o.-year-old female, returning for follow-up for LADA, dx as DM2 in 2008, insulin-dependent since 2015, uncontrolled, with complications (h/o stroke in 2012). Pt. previously saw Dr. Fransico Him and then Dr. Everardo All, but last visit with me 4 months ago. She is here with her husband who offers part of the history.  Interim history: No increased urination, blurry vision, nausea. In 11/2022, she was admitted with vomiting without nausea.  Was found to have an NSTEMI and had 2 DES stents placed.  She developed PAF post MI, which converted to sinus rhythm spontaneously.  Of note, a lipase was normal.  She is still feeling very tired.  Sugars have been slightly higher since her MI. No chest pain or shortness of breath at today's visit. Her husband was very sick since last visit approximately 1.5 months ago and was in the ICU.  Her sugars were higher at that time.  Reviewed HbA1c: Lab Results  Component Value Date   HGBA1C 7.7 (A) 07/28/2023   HGBA1C 7.7 (A) 01/31/2023   HGBA1C 7.2 (A) 10/01/2022   HGBA1C 6.9 (A) 05/27/2022   HGBA1C 7.0 (A) 01/21/2022   HGBA1C 7.0 (A) 10/21/2021   HGBA1C 6.7 (A) 05/14/2021   HGBA1C 7.5 (A) 01/21/2021   HGBA1C 8.7 (A) 10/20/2020   HGBA1C 8.0 (H) 07/20/2019   Pt is on a regimen of: - Metformin ER 500 mg 1x a day in am - Trulicity 1.5 mg weekly (PAP) - tried the 3 mg >> lost 18 lbs 2/2 diarrhea - Levemir 24 units at bedtime (PAP) >> Levemir 29 units daily - did not start Tresiba - Novolog 8-10 units 3x a day, right before meals (PAP) >> 8-12 units 3x a day, 5-10 min before meals (tried 15 min but this was dropping her blood sugars too low) >> 10-14 units 3x a day At bedtime, if sugars are >200, may take 3-4 units of Novolog for correction.  Pt. Tried a CGM (libre) >> allergy to the adhesive; Dexcom was not covered.  She checks her sugars 4x a day and they  are: - am: 131-226 >> 114-226, 238 >> 141-223 >> 112-216, 238 (stress) - 2h after b'fast: n/c>> 89 - before lunch: 96-245 >> 51, 70, 97-209 >> 118-247 >> 110-230, 278 - 2h after lunch: n/c - before dinner: 130-241 >> 76-229, 277 >> 90-256 >> 102-250 - 2h after dinner: n/c - bedtime: 88-251 >> 95, 123-249 >> 161-246 >> 153-236 - nighttime: n/c Lowest sugar was 27 >> ...88 >> 51 >> 83 >> 90; she has hypoglycemia awareness at 70.  Highest sugar was 251 >> 366>> 256 >> 278.  Glucometer: One Touch ultra  - no CKD, last BUN/creatinine:  Lab Results  Component Value Date   BUN 19 03/29/2023   BUN 25 02/07/2023   CREATININE 0.87 03/29/2023   CREATININE 0.89 02/07/2023   Lab Results  Component Value Date   MICRALBCREAT 9 04/19/2018   MICRALBCREAT 5 10/11/2016  She is off losartan 25 mg daily.  - + HL; last set of lipids available for review: Lab Results  Component Value Date   CHOL 160 03/29/2023   HDL 73 03/29/2023   LDLCALC 65 03/29/2023   TRIG 127 03/29/2023   CHOLHDL 2.2 03/29/2023  She is on Zocor 80 mg daily, Zetia 10 mg daily.  - last eye exam was in 06/2023. No DR reportedly. [facility]. Glaucoma suspect.  - no numbness and tingling in her feet.  Last foot exam 10/01/2022.  She has a history of HTN, OA, GERD.  ROS: + see HPI  Past Medical History:  Diagnosis Date   Arthritis    CAD (coronary artery disease) 03/28/2023   NSTEMI 11/2022 s/p 3 x 16 mm DES to the [facility], 3 x 20 mm DES to the mRCA    - LHC 12/13/2022: RCA ostial 95, mid 90; LCx mid 70; LAD mid 40, distal 70>> Med Rx for LAD, LCx   - TTE 12/09/2022: EF 50-55, no RWMA, mild concentric LVH, GR 2 DD, normal RVSF, mild MR, RAP 15   Diabetes mellitus    since 2008   Essential hypertension, benign 09/10/2015   GERD (gastroesophageal reflux disease)    Hypercholesteremia    Hyperlipidemia    Stroke (HCC) 03/21/2011   only deficits-affected eyesight   Past Surgical History:  Procedure  Laterality Date   BIOPSY  07/02/2022   Procedure: BIOPSY;  Surgeon: Dolores Frame, MD;  Location: [facility];  Service: Gastroenterology;;   CATARACT EXTRACTION W/PHACO  11/01/2011   Procedure: CATARACT EXTRACTION PHACO AND INTRAOCULAR LENS PLACEMENT (IOC);  Surgeon: Gemma Payor, MD;  Location: [facility] ORS;  Service: Ophthalmology;  Laterality: Right;  CDE 12.48   CORONARY STENT INTERVENTION N/A 12/10/2022   Procedure: CORONARY STENT INTERVENTION;  Surgeon: Tonny Bollman, MD;  Location: [facility] INVASIVE CV LAB;  Service: Cardiovascular;  Laterality: N/A;   ESOPHAGEAL DILATION N/A 04/29/2017   Procedure: ESOPHAGEAL DILATION;  Surgeon: Malissa Hippo, MD;  Location: [facility];  Service: Endoscopy;  Laterality: N/A;   ESOPHAGOGASTRODUODENOSCOPY N/A 04/29/2017   Procedure: ESOPHAGOGASTRODUODENOSCOPY (EGD);  Surgeon: Malissa Hippo, MD;  Location: [facility];  Service: Endoscopy;  Laterality: N/A;  1015   ESOPHAGOGASTRODUODENOSCOPY (EGD) WITH ESOPHAGEAL DILATION N/A 02/14/2013   Procedure: ESOPHAGOGASTRODUODENOSCOPY (EGD) WITH ESOPHAGEAL DILATION;  Surgeon: Malissa Hippo, MD;  Location: [facility];  Service: Endoscopy;  Laterality: N/A;  200-moved to 255 Ann to notify pt   ESOPHAGOGASTRODUODENOSCOPY (EGD) WITH PROPOFOL N/A 05/28/2022   Procedure: ESOPHAGOGASTRODUODENOSCOPY (EGD) WITH PROPOFOL;  Surgeon: Dolores Frame, MD;  [facility];  Service: Gastroenterology;  Laterality: N/A;  1030 ASA 2   ESOPHAGOGASTRODUODENOSCOPY (EGD) WITH PROPOFOL N/A 07/02/2022   Procedure: ESOPHAGOGASTRODUODENOSCOPY (EGD) WITH PROPOFOL;  Surgeon: Dolores Frame, MD;  Location: [facility];  Service: Gastroenterology;  Laterality: N/A;  105 ASA 2, pt knows new time per Soledad Gerlach   EYE SURGERY  10-2010   left eye removal of cataract   LEFT HEART CATH AND CORONARY ANGIOGRAPHY N/A 12/10/2022   Procedure: LEFT HEART CATH AND CORONARY ANGIOGRAPHY;  Surgeon: Tonny Bollman, MD;   Location: [facility] INVASIVE CV LAB;  Service: Cardiovascular;  Laterality: N/A;   TUBAL LIGATION  1984   Social History   Socioeconomic History   Marital status: Married    Spouse name: wayne   Number of children: 2   Years of education: 12   Highest education level: Not on file  Occupational History   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   Patient is right handed.   Patient has hs education.   Patient drinks 3-5 glasses of tea daily.   Social Determinants of Health   Financial Resource Strain: Not on file  Food Insecurity: No Food Insecurity (12/10/2022)   Hunger Vital Sign    Worried About Running Out of Food in the Last Year: Never true    Ran Out of Food in the Last Year: Never true  Transportation Needs: No Transportation Needs (12/10/2022)   PRAPARE - Administrator, Civil Service (Medical): No    Lack of Transportation (Non-Medical): No  Physical Activity: Not on file  Stress: Not on file  Social Connections: Unknown (02/01/2022)   [facility]   Social Network    Social Network: Not on file  Intimate Partner Violence: Not At Risk (12/10/2022)   Humiliation, Afraid, Rape, and Kick questionnaire    Fear of Current or Ex-Partner: No    Emotionally Abused: No    Physically Abused: No    Sexually Abused: No   Current Outpatient Medications on File Prior to Visit  Medication Sig Dispense Refill   acetaminophen (TYLENOL) 650 MG CR tablet Take 650 mg by mouth every 8 (eight) hours as needed for pain.     aspirin EC 81 MG tablet  Take 1 tablet (81 mg total) by mouth daily. Swallow whole. 30 tablet 12   Biotin 10 MG TABS Take by mouth daily at 6 (six) AM.     Blood Glucose Monitoring Suppl (ONE TOUCH ULTRA 2) w/Device KIT : Use as instructed 4 x daily 1 kit 0   calcium carbonate (TUMS - DOSED IN MG ELEMENTAL CALCIUM) 500 MG chewable tablet Chew 1 tablet by mouth at bedtime as needed for indigestion or heartburn.     cholecalciferol (VITAMIN D3) 25 MCG (1000 UNIT) tablet Take 10 mcg by mouth daily.     clopidogrel (PLAVIX) 75 MG tablet Take 1 tablet (75 mg total) by mouth daily. 30 tablet 11   diltiazem (CARDIZEM) 30 MG tablet Take 1 tablet (30 mg total) by mouth 2 (two) times daily. Start on 04/10/23 60 tablet 11   Dulaglutide (TRULICITY) 1.5 MG/0.5ML SOAJ Inject 1.5 mg into the skin once a week. 8 mL 3   ezetimibe (ZETIA) 10 MG tablet Take 10 mg by mouth at bedtime.     GLOBAL EASE INJECT PEN NEEDLES 32G X 4 MM MISC USE 4 PENTIPS EVERY DAY 100 each 5   Global Inject Ease Lancets 30G MISC DIABETIC CLUB Use to check glucose 4-6 times a day 600 each 5   insulin aspart (NOVOLOG FLEXPEN) 100 UNIT/ML FlexPen Inject 10-12 Units into the skin 3 (three) times daily with meals. (Patient taking differently: Inject into the skin 3 (three) times daily with meals. 10-14 units prior to meals) 30 mL 3   insulin detemir (LEVEMIR) 100 UNIT/ML FlexPen Inject 27 Units into the skin at bedtime. (Patient taking differently: Inject 29 Units into the skin at bedtime.) 30 mL 3   Lancets 30G MISC Use to check glucose 4-6 times a day 500 each 2   latanoprost (XALATAN) 0.005 % ophthalmic solution Place 1 drop into both eyes at bedtime.  11   metFORMIN (GLUCOPHAGE-XR) 500 MG 24 hr tablet TAKE 1 TABLET BY MOUTH EVERY DAY WITH BREAKFAST 90 tablet 1  Multiple Vitamin (ONE-A-DAY 55 PLUS PO) Take 1 tablet by mouth daily.      Multiple Vitamins-Minerals (HAIR SKIN AND NAILS FORMULA) TABS Take 1 tablet by mouth daily.     Omega-3 Fatty Acids (FISH OIL) 1000  MG CAPS Take 1,000 mg by mouth daily.     omeprazole (PRILOSEC) 40 MG capsule Take 1 capsule (40 mg total) by mouth daily. 90 capsule 3   ONETOUCH ULTRA test strip TEST BLOOD SUGAR FOUR TIMES DAILY 400 strip 11   Probiotic Product (PROBIOTIC DAILY PO) Take 1 tablet by mouth daily.     rosuvastatin (CRESTOR) 40 MG tablet Take 1 tablet (40 mg total) by mouth daily. 90 tablet 3   timolol (BETIMOL) 0.5 % ophthalmic solution Place 1 drop into both eyes 2 (two) times daily.     vitamin C (ASCORBIC ACID) 500 MG tablet Take 500 mg by mouth daily.     vitamin E 180 MG (400 UNITS) capsule Take 400 Units by mouth daily.     No current facility-administered medications on file prior to visit.   Allergies  Allergen Reactions   Ampicillin Hives    Breaks out in bumps Has patient had a PCN reaction causing immediate rash, facial/tongue/throat swelling, SOB or lightheadedness with hypotension: No Has patient had a PCN reaction causing severe rash involving mucus membranes or skin necrosis: Yes Has patient had a PCN reaction that required hospitalization: No Has patient had a PCN reaction occurring within the last 10 years: No If all of the above answers are "NO", then may proceed with Cephalosporin use.    Bromday [Bromfenac Sodium] Itching and Swelling    Eye Drops   Lisinopril Cough   Tetanus Toxoids Hives    Breaks out in bumps   Family History  Problem Relation Age of Onset   Cancer Mother    Diabetes Mother    Cancer Father    Diabetes Father    Anesthesia problems Neg Hx    Hypotension Neg Hx    Malignant hyperthermia Neg Hx    Pseudochol deficiency Neg Hx    PE: BP 138/80   Pulse 83   Ht 5' (1.524 m)   Wt 121 lb 9.6 oz (55.2 kg)   SpO2 97%   BMI 23.75 kg/m  Wt Readings from Last 3 Encounters:  07/28/23 121 lb 9.6 oz (55.2 kg)  04/25/23 121 lb 9.6 oz (55.2 kg)  03/29/23 120 lb 6.4 oz (54.6 kg)   Constitutional: normal weight, in NAD Eyes: no exophthalmos ENT: no  thyromegaly, no cervical lymphadenopathy Cardiovascular: RRR, No MRG Respiratory: CTA B Musculoskeletal: no deformities Skin: no rashes Neurological: no tremor with outstretched hands Diabetic Foot Exam - Simple   Simple Foot Form Diabetic Foot exam was performed with the following findings: Yes 07/28/2023 11:35 AM  Visual Inspection No deformities, no ulcerations, no other skin breakdown bilaterally: Yes Sensation Testing Intact to touch and monofilament testing bilaterally: Yes Pulse Check Posterior Tibialis and Dorsalis pulse intact bilaterally: Yes Comments    ASSESSMENT: 1. LADA, insulin-dependent, uncontrolled, with complications - Cerebrovascular disease - h/o stroke (affecting eyesight only)  Component     Latest Ref Rng 05/27/2022  Glucose     65 - 99 mg/dL 578 (H)   Hemoglobin I6N     4.0 - 5.6 % 6.9 !   Islet Cell Ab     Neg:<1:1  Negative   C-Peptide     0.80 - 3.85 ng/mL 1.85   ZNT8 Antibodies     <  15 U/mL <10   Glutamic Acid Decarb Ab     <5 IU/mL >250 (H)    Normal insulin production, but elevated GAD Ab's.  These labs confirm LADA, which explains her blood sugar variability.   2. HL  PLAN:  1. Patient with longstanding, uncontrolled diabetes, diagnosed as LADA with GAD antibodies returned undetectably high in 05/2022.  We have her on a regimen containing metformin, weekly GLP-1 receptor agonist and basal/bolus insulin regimen, which we adjusted at last visit -increased her NovoLog doses.  At that time, her HbA1c was 7.7%. -At today's visit, per review of her excellent blood sugar log, sugars still appear to be quite fluctuating, with frequent highs in the 200s later in the day (she also had such blood sugars in the morning when husband was sick and she was under a lot of stress).  Since the majority of her blood sugars are high, I suggested an increase in her basal insulin and we also discussed about trying to add a NovoLog sliding scale to help prevent  postprandial hyperglycemia when sugars are high before meals.  We can continue the rest of the regimen otherwise. - I suggested to:  Patient Instructions  Please continue: - Metformin ER 500 mg 1x a day - Trulicity 1.5 mg weekly  Please increase: - Levemir/Tresiba 32-34 units at bedtime  Change: - Novolog 12-14 units 3x a day, 10 min before meals Add the following NovoLog sliding scale: 175-200: + 1 unit 201-250: + 2 units 251-300: + 3 units 301-350: + 4 units >350: + 5 units   At bedtime, if sugars are >200, may take 3-4 units of Novolog for correction.  Please return in 3-4 months with your sugar log.   - -we checked her HbA1c: 7.7% (stable) - advised to check sugars at different times of the day - 4x a day, rotating check times - advised for yearly eye exams >> she is UTD - will check an ACR today - return to clinic in 3-4 months   2. HL -Latest lipid panel from 03/2023 was reviewed: LDL slightly above our target of less than 55 due to history of cardiovascular disease, otherwise fractions at goal: Lab Results  Component Value Date   CHOL 160 03/29/2023   HDL 73 03/29/2023   LDLCALC 65 03/29/2023   TRIG 127 03/29/2023   CHOLHDL 2.2 03/29/2023  -She is on Zocor 40 mg daily and Zetia 10 mg daily without side effects  Carlus Pavlov, MD PhD [facility] Endocrinology

## 2023-07-28 NOTE — Patient Instructions (Addendum)
Please continue: - Metformin ER 500 mg 1x a day - Trulicity 1.5 mg weekly  Please increase: - Levemir/Tresiba 32-34 units at bedtime  Change: - Novolog 12-14 units 3x a day, 10 min before meals Add the following NovoLog sliding scale: 175-200: + 1 unit 201-250: + 2 units 251-300: + 3 units 301-350: + 4 units >350: + 5 units   At bedtime, if sugars are >200, may take 3-4 units of Novolog for correction.  Please return in 3-4 months with your sugar log.

## 2023-07-29 ENCOUNTER — Telehealth: Payer: Self-pay

## 2023-07-29 NOTE — Telephone Encounter (Signed)
Results given as directed by MD, no further questions from patient

## 2023-07-29 NOTE — Telephone Encounter (Signed)
-----   Message from Carlus Pavlov sent at 07/28/2023  4:51 PM EST ----- Can you please call pt.:  Urinary proteins are normal.

## 2023-08-09 DIAGNOSIS — H401131 Primary open-angle glaucoma, bilateral, mild stage: Secondary | ICD-10-CM | POA: Diagnosis not present

## 2023-08-12 ENCOUNTER — Other Ambulatory Visit: Payer: PPO

## 2023-08-12 DIAGNOSIS — E782 Mixed hyperlipidemia: Secondary | ICD-10-CM | POA: Diagnosis not present

## 2023-08-13 LAB — LIPID PANEL
Chol/HDL Ratio: 1.9 {ratio} (ref 0.0–4.4)
Cholesterol, Total: 129 mg/dL (ref 100–199)
HDL: 68 mg/dL (ref >39)
LDL Chol Calc (NIH): 43 mg/dL (ref 0–99)
Triglycerides: 96 mg/dL (ref 0–149)
VLDL Cholesterol Cal: 18 mg/dL (ref 5–40)

## 2023-08-13 LAB — ALT: ALT: 25 [IU]/L (ref 0–32)

## 2023-08-15 NOTE — Progress Notes (Signed)
Pt has been made aware of normal result and verbalized understanding.  jw

## 2023-08-26 ENCOUNTER — Telehealth: Payer: Self-pay

## 2023-08-26 NOTE — Telephone Encounter (Signed)
Patient Assistance  Medication: Novolog  Dosage:U-100 Quantity: 4 boxes  Date received:08/26/23  Alexcia Schools,RMA  Left a detailed message,   J.Thurman Sarver,RMA

## 2023-08-29 ENCOUNTER — Telehealth: Payer: Self-pay | Admitting: Internal Medicine

## 2023-08-29 NOTE — Telephone Encounter (Signed)
MEDICATION: Lancing Device (would like 2 - 1 for home and 1 for when patient is out of the home)  PHARMACY:  [facility]  HAS THE PATIENT CONTACTED THEIR PHARMACY?  yes  IS THIS A 90 DAY SUPPLY :   IS PATIENT OUT OF MEDICATION: her lancing device is broken  IF NOT; HOW MUCH IS LEFT:   LAST APPOINTMENT DATE: @12 /02/2023  NEXT APPOINTMENT DATE:@2 /21/2025  DO WE HAVE YOUR PERMISSION TO LEAVE A DETAILED MESSAGE?:  OTHER COMMENTS:    **Let patient know to contact pharmacy at the end of the day to make sure medication is ready. **  ** Please notify patient to allow 48-72 hours to process**  **Encourage patient to contact the pharmacy for refills or they can request refills through [facility]**

## 2023-08-29 NOTE — Telephone Encounter (Signed)
Patient picked up patient assistance - Log Noted

## 2023-08-30 MED ORDER — LANCING DEVICE MISC: 0 refills | Status: DC

## 2023-08-30 NOTE — Telephone Encounter (Signed)
Rx sent.  Requested Prescriptions   Signed Prescriptions Disp Refills   Lancet Devices (LANCING DEVICE) MISC 2 each 0    Sig: Use to check blood sugar four times a day    Authorizing Provider: Carlus Pavlov    Ordering User: Pollie Meyer

## 2023-09-09 ENCOUNTER — Telehealth: Payer: Self-pay

## 2023-09-09 MED ORDER — LANCING DEVICE MISC: 0 refills | Status: DC

## 2023-09-09 NOTE — Telephone Encounter (Signed)
Patient called back stating that she does not use [facility] any more. Rx sent to [facility].

## 2023-09-09 NOTE — Telephone Encounter (Signed)
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 She says this has been going on since 08/21/23 and wanted to bring this to your attention. She says this is not an urgent matter and stated that she wanted [place] to call her back on Monday .

## 2023-09-09 NOTE — Addendum Note (Signed)
Addended by: Pollie Meyer on: 09/09/2023 04:58 PM   Modules accepted: Orders

## 2023-09-15 MED ORDER — LANCETS 30G MISC: 5 refills | Status: DC

## 2023-09-15 NOTE — Telephone Encounter (Signed)
Requested Prescriptions   Signed Prescriptions Disp Refills   Lancet Devices (LANCING DEVICE) MISC 2 each 0    Sig: Use to check blood sugar four times a day [employer] Brand    Authorizing Provider: Carlus Pavlov    Ordering User: Loneta Tamplin S   Lancets 30G MISC 600 each 5    Sig: Use to check glucose 4-6 times a day    Authorizing Provider: Carlus Pavlov    Ordering User: Pollie Meyer

## 2023-09-15 NOTE — Telephone Encounter (Signed)
Pt has been notified and voices understanding.   Dicie Beam

## 2023-09-15 NOTE — Addendum Note (Signed)
Addended by: Pollie Meyer on: 09/15/2023 08:48 AM   Modules accepted: Orders

## 2023-09-27 ENCOUNTER — Other Ambulatory Visit (HOSPITAL_COMMUNITY): Payer: Self-pay | Admitting: Internal Medicine

## 2023-09-27 DIAGNOSIS — E1165 Type 2 diabetes mellitus with hyperglycemia: Secondary | ICD-10-CM | POA: Diagnosis not present

## 2023-09-27 DIAGNOSIS — Z136 Encounter for screening for cardiovascular disorders: Secondary | ICD-10-CM

## 2023-09-27 DIAGNOSIS — N182 Chronic kidney disease, stage 2 (mild): Secondary | ICD-10-CM | POA: Diagnosis not present

## 2023-09-27 DIAGNOSIS — I700 Atherosclerosis of aorta: Secondary | ICD-10-CM | POA: Diagnosis not present

## 2023-09-27 DIAGNOSIS — Z6824 Body mass index (BMI) 24.0-24.9, adult: Secondary | ICD-10-CM | POA: Diagnosis not present

## 2023-09-27 DIAGNOSIS — M1991 Primary osteoarthritis, unspecified site: Secondary | ICD-10-CM | POA: Diagnosis not present

## 2023-09-27 DIAGNOSIS — I10 Essential (primary) hypertension: Secondary | ICD-10-CM | POA: Diagnosis not present

## 2023-09-27 DIAGNOSIS — Z1231 Encounter for screening mammogram for malignant neoplasm of breast: Secondary | ICD-10-CM

## 2023-09-27 DIAGNOSIS — E1122 Type 2 diabetes mellitus with diabetic chronic kidney disease: Secondary | ICD-10-CM | POA: Diagnosis not present

## 2023-09-27 DIAGNOSIS — I63139 Cerebral infarction due to embolism of unspecified carotid artery: Secondary | ICD-10-CM | POA: Diagnosis not present

## 2023-10-24 ENCOUNTER — Telehealth: Payer: Self-pay

## 2023-10-24 NOTE — Telephone Encounter (Signed)
Patient Assistance  Medication:Tresiba  Dosage: u-200 Quantity:2 boxes   Nancy Sutton

## 2023-10-25 ENCOUNTER — Encounter: Payer: Self-pay | Admitting: Internal Medicine

## 2023-10-25 NOTE — Telephone Encounter (Signed)
 Patient picked up patient assistance - Log Noted

## 2023-11-03 ENCOUNTER — Other Ambulatory Visit: Payer: Self-pay | Admitting: Internal Medicine

## 2023-11-03 DIAGNOSIS — E139 Other specified diabetes mellitus without complications: Secondary | ICD-10-CM

## 2023-11-11 ENCOUNTER — Ambulatory Visit: Payer: PPO | Admitting: Internal Medicine

## 2023-11-18 ENCOUNTER — Ambulatory Visit: Payer: PPO

## 2023-11-22 ENCOUNTER — Other Ambulatory Visit: Payer: PPO

## 2023-11-23 ENCOUNTER — Ambulatory Visit (INDEPENDENT_AMBULATORY_CARE_PROVIDER_SITE_OTHER): Payer: PPO

## 2023-11-25 ENCOUNTER — Ambulatory Visit (INDEPENDENT_AMBULATORY_CARE_PROVIDER_SITE_OTHER): Payer: PPO | Admitting: Otolaryngology

## 2023-11-25 ENCOUNTER — Encounter (INDEPENDENT_AMBULATORY_CARE_PROVIDER_SITE_OTHER): Payer: Self-pay

## 2023-11-25 VITALS — BP 177/69 | HR 86 | Ht 60.0 in | Wt 122.0 lb

## 2023-11-25 DIAGNOSIS — H6123 Impacted cerumen, bilateral: Secondary | ICD-10-CM

## 2023-11-25 DIAGNOSIS — H903 Sensorineural hearing loss, bilateral: Secondary | ICD-10-CM | POA: Diagnosis not present

## 2023-11-28 DIAGNOSIS — H6123 Impacted cerumen, bilateral: Secondary | ICD-10-CM | POA: Insufficient documentation

## 2023-11-28 DIAGNOSIS — H903 Sensorineural hearing loss, bilateral: Secondary | ICD-10-CM | POA: Insufficient documentation

## 2023-11-28 NOTE — Progress Notes (Signed)
 Patient ID: Nancy Sutton, female   DOB: 08/24/1946, 78 y.o.   MRN: [id number]  Follow-up: Hearing loss, recurrent cerumen impaction  HPI: The patient is a 78 year old female who returns today for her follow-up evaluation.  The patient was last seen 1 year ago.  At that time, she was complaining of bilateral progressive hearing loss and bilateral cerumen impaction.  She was treated with cerumen disimpaction.  The hearing amplification options were also discussed.  The patient returns today reporting no new hearing difficulty.  The patient currently denies any otalgia, otorrhea, or vertigo.  Exam: General: Communicates without difficulty, well nourished, no acute distress. Head: Normocephalic, no evidence injury, no tenderness, facial buttresses intact without stepoff. Face/sinus: No tenderness to palpation and percussion. Facial movement is normal and symmetric. Eyes: PERRL, EOMI. No scleral icterus, conjunctivae clear. Neuro: CN II exam reveals vision grossly intact.  No nystagmus at any point of gaze. Ears: Auricles well formed without lesions.  Bilateral cerumen impaction. Nose: External evaluation reveals normal support and skin without lesions.  Dorsum is intact.  Anterior rhinoscopy reveals pink mucosa over anterior aspect of inferior turbinates and intact septum.  No purulence noted. Oral:  Oral cavity and oropharynx are intact, symmetric, without erythema or edema.  Mucosa is moist without lesions. Neck: Full range of motion without pain.  There is no significant lymphadenopathy.  No masses palpable.  Thyroid bed within normal limits to palpation.  Parotid glands and submandibular glands equal bilaterally without mass.  Trachea is midline. Neuro:  CN 2-12 grossly intact.  Procedure: Bilateral cerumen disimpaction Anesthesia: None Description: Under the operating microscope, the cerumen is carefully removed with a combination of cerumen currette, alligator forceps, and suction catheters.  After the  cerumen is removed, the TMs are noted to be normal.  No mass, erythema, or lesions. The patient tolerated the procedure well.   Assessment: 1.  Bilateral cerumen impaction.  After the disimpaction procedure, both tympanic membranes and middle ear spaces are noted to be normal.  2.  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  Plan: 1.  Otomicroscopy with bilateral cerumen disimpaction.  2.  The physical exam findings and the hearing test results are reviewed with the patient.  3.  The patient is a candidate for hearing amplification.  The hearing aid options are discussed.  4.  The patient will return for re-evaluation in 1 year, sooner if needed.

## 2023-12-15 ENCOUNTER — Ambulatory Visit
Admission: RE | Admit: 2023-12-15 | Discharge: 2023-12-15 | Disposition: A | Discharge status: Home / Self Care | Payer: PPO | Source: Ambulatory Visit | Attending: Internal Medicine | Admitting: Internal Medicine

## 2023-12-15 ENCOUNTER — Ambulatory Visit: Payer: PPO

## 2023-12-15 DIAGNOSIS — Z1231 Encounter for screening mammogram for malignant neoplasm of breast: Secondary | ICD-10-CM | POA: Diagnosis not present

## 2023-12-20 DIAGNOSIS — I6523 Occlusion and stenosis of bilateral carotid arteries: Secondary | ICD-10-CM | POA: Diagnosis not present

## 2023-12-22 ENCOUNTER — Telehealth: Payer: Self-pay

## 2023-12-22 NOTE — Telephone Encounter (Signed)
 Patient Assistance  Medication:Novolog  Dosage:U-100 Quantity:4 boxes  Novo Fine Pen Needles  2 boxes

## 2023-12-22 NOTE — Telephone Encounter (Signed)
Patient came in to office today and picked up 4 boxes of Novolog and 2 boxes of NovoFine Pen Needles.  Both were patient assistance.

## 2023-12-29 ENCOUNTER — Ambulatory Visit: Payer: PPO | Attending: Internal Medicine | Admitting: Internal Medicine

## 2023-12-29 ENCOUNTER — Encounter: Payer: Self-pay | Admitting: Internal Medicine

## 2023-12-29 VITALS — BP 176/80 | HR 78 | Ht 60.0 in | Wt 125.0 lb

## 2023-12-29 DIAGNOSIS — I214 Non-ST elevation (NSTEMI) myocardial infarction: Secondary | ICD-10-CM

## 2023-12-29 DIAGNOSIS — E782 Mixed hyperlipidemia: Secondary | ICD-10-CM

## 2023-12-29 DIAGNOSIS — I2510 Atherosclerotic heart disease of native coronary artery without angina pectoris: Secondary | ICD-10-CM

## 2023-12-29 DIAGNOSIS — I10 Essential (primary) hypertension: Secondary | ICD-10-CM | POA: Diagnosis not present

## 2023-12-29 DIAGNOSIS — I480 Paroxysmal atrial fibrillation: Secondary | ICD-10-CM

## 2023-12-29 MED ORDER — DILTIAZEM HCL 30 MG PO TABS: 30.0000 mg | ORAL_TABLET | Freq: Two times a day (BID) | ORAL | 3 refills | Status: DC

## 2023-12-29 NOTE — Patient Instructions (Signed)
 Medication Instructions:  Your physician has recommended you make the following change in your medication:   1) STOP aspirin  A refill for diltiazem has been sent to your pharmacy for 90 day supply.  *If you need a refill on your cardiac medications before your next appointment, please call your pharmacy*  Follow-Up: At [facility], you and your health needs are our priority.  As part of our continuing mission to provide you with exceptional heart care, we have created designated Provider Care Teams.  These Care Teams include your primary Cardiologist (physician) and Advanced Practice Providers (APPs -  Physician Assistants and Nurse Practitioners) who all work together to provide you with the care you need, when you need it.  Your next appointment:   1 year(s)  The format for your next appointment:   In Person  Provider:   Christell Constant, MD {  Other Instructions   1st Floor: - Lobby - Registration  - Pharmacy  - Lab - Cafe  2nd Floor: - PV Lab - Diagnostic Testing (echo, CT, nuclear med)  3rd Floor: - Vacant  4th Floor: - TCTS (cardiothoracic surgery) - AFib Clinic - Structural Heart Clinic - Vascular Surgery  - Vascular Ultrasound  5th Floor: - HeartCare Cardiology (general and EP) - Clinical Pharmacy for coumadin, hypertension, lipid, weight-loss medications, and med management appointments    Valet parking services will be available as well.

## 2023-12-29 NOTE — Progress Notes (Signed)
 Cardiology Office Note:  .    Date:  12/29/2023  ID:  TALULLAH ABATE, DOB 1945-11-21, MRN [id number] PCP: Elfredia Nevins, MD  [facility] Providers Cardiologist:  Christell Constant, MD     CC: f/u NSTEMI  History of Present Illness: .    TALAH COOKSTON is a 78 y.o. female 7 with coronary artery disease and atrial fibrillation who presents for follow-up regarding her cardiovascular health.  She has a history of coronary artery disease and was hospitalized in March 2024 for a STEMI. During that hospitalization, she underwent cardiac catheterization which revealed multivessel disease with severe RCA stenosis, for which she received intervention. She also had diffuse LAD disease that was managed medically due to the lack of a clear target for intervention. She now has stents placed and feels great with no chest pain or shortness of breath since the intervention. Her cholesterol levels have improved significantly.  She has a history of atrial fibrillation, with a minimal burden noted during a single episode in March 2024. Subsequent heart monitoring showed no atrial fibrillation, although rare SVT was noted. She did not start on blood thinners following shared decision-making with her healthcare provider. No further episodes of atrial fibrillation and remains asymptomatic.  She has a past medical history of a stroke, which occurred in 2012, attributed to a significant increase in blood pressure. Multiple doctors have commented on the severity of the stroke and her remarkable recovery, as she is able to talk and walk without issues. No embolic stroke history.  Her blood pressure is reportedly well-controlled at home, although it tends to be elevated during medical visits. She is currently on diltiazem 30 mg twice daily, and there was a recent issue with her prescription being filled for only 60 days instead of 90 days.  She has a history of acid reflux and has had multiple GI  evaluations over the past year. Aleve is the only medication that helps her arthritis, although she was advised to avoid it post-heart attack and use Tylenol instead. She takes Aleve sparingly, especially during the fall and winter months.  She is active, walking daily except on Sundays, and feels great during these activities with no chest pain or breathing issues.   Relevant histories: .  Social from [place], prefers [place]  Discussed the use of AI scribe software for clinical note transcription with the patient, who gave verbal consent to proceed.  ROS: As per HPI.   Studies Reviewed: .   Cardiac Studies & Procedures   ______________________________________________________________________________________________ CARDIAC CATHETERIZATION  CARDIAC CATHETERIZATION 12/10/2022  Narrative   Ost RCA to Prox RCA lesion is 95% stenosed.   Mid RCA lesion is 90% stenosed.   Mid Cx to Dist Cx lesion is 70% stenosed.   Dist LAD lesion is 70% stenosed.   Mid LAD lesion is 40% stenosed.   A drug-eluting stent was successfully placed using a SYNERGY XD 3.0X16.   A drug-eluting stent was successfully placed using a SYNERGY XD 3.0X20.   Post intervention, there is a 0% residual stenosis.   Post intervention, there is a 0% residual stenosis.  1.  Severe ostial and mid RCA stenoses, both treated with PCI (3.0 x 16 mm Synergy DES for the ostial lesion and 3.0 x 20 mm Synergy DES for the mid lesion). 2.  Widely patent left main with no stenosis 3.  Patent LAD with mild diffuse plaquing and moderately severe mid to distal vessel stenosis appropriate for medical therapy 4.  Patent left circumflex with moderate diffuse mid vessel stenosis, appropriate for medical therapy 5.  Normal LVEDP  Recommendations: DAPT with aspirin and clopidogrel through 12 months, then resume clopidogrel monotherapy.  Aggressive medical therapy for risk reduction.  Patient eligible for hospital discharge tomorrow if no complications  arise.  Findings Coronary Findings Diagnostic  Dominance: Right  Left Main There is mild diffuse disease throughout the vessel.  Left Anterior Descending There is mild diffuse disease throughout the vessel. Mid LAD lesion is 40% stenosed. Dist LAD lesion is 70% stenosed.  First Diagonal Branch There is mild disease in the vessel. Mild diffuse plaquing without significant stenosis.  Large diagonal branch supplying multiple subbranches.  Left Circumflex Mid Cx to Dist Cx lesion is 70% stenosed. The lesion is moderately calcified.  Right Coronary Artery Ost RCA to Prox RCA lesion is 95% stenosed. The lesion is moderately calcified. Mid RCA lesion is 90% stenosed. The lesion is moderately calcified.  Intervention  Ost RCA to Prox RCA lesion Stent A drug-eluting stent was successfully placed using a SYNERGY XD 3.0X16. Post-Intervention Lesion Assessment The intervention was successful. Pre-interventional TIMI flow is 3. Post-intervention TIMI flow is 3. No complications occurred at this lesion. There is a 0% residual stenosis post intervention.  Mid RCA lesion Stent A drug-eluting stent was successfully placed using a SYNERGY XD 3.0X20. Post-Intervention Lesion Assessment The intervention was successful. Pre-interventional TIMI flow is 3. Post-intervention TIMI flow is 3. No complications occurred at this lesion. There is a 0% residual stenosis post intervention.     ECHOCARDIOGRAM  ECHOCARDIOGRAM COMPLETE 12/09/2022  Narrative ECHOCARDIOGRAM REPORT    Patient Name:   JANELLI WELLING Date of Exam: 12/09/2022 Medical Rec #:  [id number]      Height:       60.0 in Accession #:    [id number]     Weight:       121.3 lb Date of Birth:  09/19/1946      BSA:          1.509 m Patient Age:    77 years       BP:           155/65 mmHg Patient Gender: F              HR:           101 bpm. Exam Location:  Inpatient  Procedure: Cardiac Doppler, Color Doppler and 2D  Echo  Indications:    Elevated Troponin  History:        Patient has no prior history of Echocardiogram examinations. Risk Factors:Hypertension and Diabetes.  Sonographer:    Lucy Antigua Referring Phys: [id number] Heloise Beecham EMOKPAE  IMPRESSIONS   1. Left ventricular ejection fraction, by estimation, is 50 to 55%. The left ventricle has low normal function. The left ventricle has no regional wall motion abnormalities. There is mild concentric left ventricular hypertrophy. Left ventricular diastolic parameters are consistent with Grade II diastolic dysfunction (pseudonormalization). 2. Right ventricular systolic function is normal. The right ventricular size is normal. Tricuspid regurgitation signal is inadequate for assessing PA pressure. 3. The mitral valve is normal in structure. Mild mitral valve regurgitation. No evidence of mitral stenosis. 4. The aortic valve is normal in structure. Aortic valve regurgitation is not visualized. No aortic stenosis is present. 5. The inferior vena cava is dilated in size with <50% respiratory variability, suggesting right atrial pressure of 15 mmHg.  FINDINGS Left Ventricle: Left ventricular ejection fraction, by estimation, is 50 to  55%. The left ventricle has low normal function. The left ventricle has no regional wall motion abnormalities. The left ventricular internal cavity size was normal in size. There is mild concentric left ventricular hypertrophy. Left ventricular diastolic parameters are consistent with Grade II diastolic dysfunction (pseudonormalization).  Right Ventricle: The right ventricular size is normal. No increase in right ventricular wall thickness. Right ventricular systolic function is normal. Tricuspid regurgitation signal is inadequate for assessing PA pressure.  Left Atrium: Left atrial size was normal in size.  Right Atrium: Right atrial size was normal in size.  Pericardium: There is no evidence of pericardial  effusion.  Mitral Valve: The mitral valve is normal in structure. Mild mitral valve regurgitation. No evidence of mitral valve stenosis.  Tricuspid Valve: The tricuspid valve is normal in structure. Tricuspid valve regurgitation is not demonstrated. No evidence of tricuspid stenosis.  Aortic Valve: The aortic valve is normal in structure. Aortic valve regurgitation is not visualized. No aortic stenosis is present. Aortic valve mean gradient measures 6.0 mmHg. Aortic valve peak gradient measures 10.1 mmHg. Aortic valve area, by VTI measures 1.52 cm.  Pulmonic Valve: The pulmonic valve was normal in structure. Pulmonic valve regurgitation is not visualized. No evidence of pulmonic stenosis.  Aorta: The aortic root is normal in size and structure.  Venous: The inferior vena cava is dilated in size with less than 50% respiratory variability, suggesting right atrial pressure of 15 mmHg.  IAS/Shunts: No atrial level shunt detected by color flow Doppler.   LEFT VENTRICLE PLAX 2D LVIDd:         3.80 cm     Diastology LVIDs:         3.20 cm     LV e' medial:    6.64 cm/s LV PW:         0.90 cm     LV E/e' medial:  20.2 LV IVS:        0.90 cm     LV e' lateral:   12.20 cm/s LVOT diam:     1.80 cm     LV E/e' lateral: 11.0 LV SV:         53 LV SV Index:   35 LVOT Area:     2.54 cm  LV Volumes (MOD) LV vol d, MOD A4C: 66.5 ml LV vol s, MOD A4C: 31.7 ml LV SV MOD A4C:     66.5 ml  RIGHT VENTRICLE             IVC RV S prime:     14.70 cm/s  IVC diam: 2.60 cm TAPSE (M-mode): 2.7 cm  LEFT ATRIUM             Index        RIGHT ATRIUM          Index LA Vol (A2C):   48.5 ml 32.14 ml/m  RA Area:     9.21 cm LA Vol (A4C):   45.2 ml 29.95 ml/m  RA Volume:   17.40 ml 11.53 ml/m LA Biplane Vol: 49.0 ml 32.47 ml/m AORTIC VALVE AV Area (Vmax):    1.33 cm AV Area (Vmean):   1.36 cm AV Area (VTI):     1.52 cm AV Vmax:           159.00 cm/s AV Vmean:          111.000 cm/s AV VTI:             0.351 m AV Peak Grad:  10.1 mmHg AV Mean Grad:      6.0 mmHg LVOT Vmax:         83.00 cm/s LVOT Vmean:        59.200 cm/s LVOT VTI:          0.210 m LVOT/AV VTI ratio: 0.60  AORTA Ao Root diam: 2.60 cm Ao Asc diam:  2.20 cm  MITRAL VALVE MV Area (PHT): 4.29 cm     SHUNTS MV Decel Time: 177 msec     Systemic VTI:  0.21 m MV E velocity: 134.00 cm/s  Systemic Diam: 1.80 cm MV A velocity: 110.00 cm/s MV E/A ratio:  1.22  Kardie Tobb DO Electronically signed by Thomasene Ripple DO Signature Date/Time: 12/09/2022/1:54:35 PM    Final    MONITORS  LONG TERM MONITOR (3-14 DAYS) 01/05/2023  Narrative Normal sinus rhythm Rare runs of SVT with the longest being 5 beats No sustained arrhythmias.       ______________________________________________________________________________________________      Physical Exam:    VS:  BP (!) 176/80 (BP Location: Left Arm)   Pulse 78   Ht 5' (1.524 m)   Wt 56.7 kg   SpO2 99%   BMI 24.41 kg/m    Wt Readings from Last 3 Encounters:  12/29/23 56.7 kg  11/25/23 55.3 kg  07/28/23 55.2 kg    Gen: no distress   HEENT: Poor dentition Cardiac: No Rubs or Gallops, no murmur, RRR +2radial pulses Respiratory: Clear to auscultation bilaterally, normal  respiratory rate GI: Soft, nontender, non-distended  MS: No  edema;  moves all extremities Integument: Skin feels warm Neuro:  At time of evaluation, alert and oriented to person/place/time/situation  Psych: Normal affect, patient feels well   ASSESSMENT AND PLAN: .     An EKG was ordered for AF and shows SR  Coronary Artery Disease (CAD) with prior STEMI   STEMI in March 2024 with multivessel disease. Severe RCA stenosis was treated with stenting. Diffuse LAD disease managed medically due to lack of clear stent target. Currently asymptomatic with no chest pain or dyspnea. Cholesterol levels are well controlled. Intervention aims to open clear blockages and maintain patency, while  diffuse disease without clear targets is managed medically.   - Continue clopidogrel (Plavix) monotherapy   - Discontinue aspirin   - continue GDMT; LDL at goal on current therapy  Atrial Fibrillation (AF)  Post operative Minimal AF burden with one resolved episode in March 2024. No further AF detected on heart monitoring, though rare SVT was noted. Shared decision with Dr. Antoine Poche led to not starting anticoagulation. Discussed risks of stroke given prior history and elevated CHADS-VASc score. Consideration of implantable loop recorder (ILR) for future AF monitoring. Discussed three options: starting full anticoagulation, continuing Plavix monotherapy, or using an ILR to monitor for AF recurrence. She opted for ILR consideration after reviewing information.   - Provide information on implantable loop recorder  (she had elevated CHASDVASC, UGI with no current bleed; and HTN stroke with high CHADSVASC) - Consider ILR if she decides after reviewing information  - discussed limiting NSAIDS  Hypertension   Blood pressure elevated in clinical settings but well-controlled at home. Previous stroke attributed to hypertensive crisis. No current symptoms of hypertension-related complications. Discussed the importance of monitoring blood pressure at home to avoid future hypertensive crises.   - Monitor blood pressure at home   - Continue current antihypertensive regimen    Type 2 Diabetes Mellitus   - working on A1C reduction  Gastroesophageal Reflux Disease (GERD)   Severe acid reflux with multiple GI evaluations. Potential risk of GI bleeding with anticoagulation therapy discussed. Discussed the risk of exacerbating GI issues with NSAIDs and the importance of using them sparingly.   - Avoid NSAIDs like Aleve if possible   - Use Aleve sparingly, especially during fall and winter  One year with me unless she is amenable to ILR  Riley Lam, MD FASE [facility] Cardiologist [facility]  [address] [place], [address] [place], [place] [address] [phone]  11:51 AM

## 2023-12-30 ENCOUNTER — Telehealth: Payer: Self-pay | Admitting: Internal Medicine

## 2023-12-30 NOTE — Telephone Encounter (Signed)
 Pt would like a c/b regarding sooner f/u than 1 year. Reason being that provider suggested her to stop a medication and continue another. Pt feels she needs to be f/u sooner than a year to make sure medication is working correctly. Please advise

## 2023-12-30 NOTE — Telephone Encounter (Signed)
 Called pt to f/u.  Reports at yesterdays OV MD gave her 3 options to prevent another stroke.  She went with option 1 stop ASA and continue Plavix.  MD to see how this plan works.  Pt speaking with husband they are concerned that waiting 1 year is too long to see if plan will work.  Pt is scared she could have another stroke if this plan isn't working and she goes a year without follow up.  Advised pt I will send concern to MD to advise.

## 2023-12-30 NOTE — Telephone Encounter (Signed)
 Patient states at her office visit yesterday with Dr. Izora Ribas they had discussed 3 options. She states they decided to go with the first option: discontinue ASA and continue Plavix and F/U to see how she is doing on this regimen.  Patient reports concerns about waiting 1 year to follow-up. She would like to discuss sooner F/U.   Will forward to Dr. Debby Bud nurse to follow-up with patient.   Patient states she and her husband have MD appts next week, and if they are not available to take call OK to leave message.

## 2024-01-03 ENCOUNTER — Ambulatory Visit (INDEPENDENT_AMBULATORY_CARE_PROVIDER_SITE_OTHER): Payer: PPO | Admitting: Internal Medicine

## 2024-01-03 ENCOUNTER — Telehealth: Payer: Self-pay | Admitting: Internal Medicine

## 2024-01-03 ENCOUNTER — Encounter: Payer: Self-pay | Admitting: Internal Medicine

## 2024-01-03 VITALS — BP 120/68 | HR 84 | Ht 60.0 in | Wt 124.2 lb

## 2024-01-03 DIAGNOSIS — E139 Other specified diabetes mellitus without complications: Secondary | ICD-10-CM | POA: Diagnosis not present

## 2024-01-03 DIAGNOSIS — E785 Hyperlipidemia, unspecified: Secondary | ICD-10-CM | POA: Diagnosis not present

## 2024-01-03 DIAGNOSIS — E109 Type 1 diabetes mellitus without complications: Secondary | ICD-10-CM | POA: Diagnosis not present

## 2024-01-03 DIAGNOSIS — E042 Nontoxic multinodular goiter: Secondary | ICD-10-CM | POA: Diagnosis not present

## 2024-01-03 LAB — POCT GLYCOSYLATED HEMOGLOBIN (HGB A1C): Hemoglobin A1C: 6.9 % — AB (ref 4.0–5.6)

## 2024-01-03 NOTE — Telephone Encounter (Signed)
 Primary Pharmacy has been updated.

## 2024-01-03 NOTE — Progress Notes (Signed)
 Patient ID: Nancy Sutton, female   DOB: 1946/05/06, 78 y.o.   MRN: [id number]  HPI: Nancy Sutton is a 78 y.o.-year-old female, returning for follow-up for LADA, dx as DM2 in 2008, insulin-dependent since 2015, uncontrolled, with complications (h/o stroke in 2012, NSTEMI 11/2022). Pt. previously saw Dr. Monte Antonio and then Dr. Washington Hacker, but last visit with me 4 months ago. She is here with her husband who offers part of the history.  Interim history: No increased urination, blurry vision, nausea.  She still has some fatigue, ever since her MI in 11/2022. No chest pain or shortness of breath.  She does have a cough, which is chronic.  She saw ENT and was advised that this is due to irritation of the nerves in her neck. She recently had a carotid ultrasound that showed several thyroid nodules.  No problems swallowing, choking.  Reviewed HbA1c: Lab Results  Component Value Date   HGBA1C 7.7 (A) 07/28/2023   HGBA1C 7.7 (A) 01/31/2023   HGBA1C 7.2 (A) 10/01/2022   HGBA1C 6.9 (A) 05/27/2022   HGBA1C 7.0 (A) 01/21/2022   HGBA1C 7.0 (A) 10/21/2021   HGBA1C 6.7 (A) 05/14/2021   HGBA1C 7.5 (A) 01/21/2021   HGBA1C 8.7 (A) 10/20/2020   HGBA1C 8.0 (H) 07/20/2019   Pt is on a regimen of: - Metformin ER 500 mg 1x a day in am - Trulicity 1.5 mg weekly (PAP) - Levemir 24 units at bedtime (PAP) >> Levemir 29 units daily >> Tresiba 32 to 34 >> 30 units at bedtime - Novolog 8-12 units 3x a day, 5-10 min before meals (tried 15 min but this was dropping her blood sugars too low) >> 10-14 >> 12-14  >> 10-12 units 3x a day (PAP) Add the following NovoLog sliding scale: 175-200: + 1 unit 201-250: + 2 units 251-300: + 3 units 301-350: + 4 units >350: + 5 units  At bedtime, if sugars are >200, may take 3-4 units of Novolog for correction.  Pt. Tried a CGM (libre) >> allergy to the adhesive; Dexcom was not covered.  She checks her sugars 4x a day and they are: - am: 114-226, 238 >> 141-223 >> 112-216, 238  (stress) >> 84-177, 190 - 2h after b'fast: n/c>> 89 >> n/c - before lunch: 51, 70, 97-209 >> 118-247 >> 110-230, 278 >> 70, 78-185 - 2h after lunch: n/c - before dinner: 76-229, 277 >> 90-256 >> 102-250 >> 78, 104-164, 226 - 2h after dinner: n/c - bedtime: 95, 123-249 >> 161-246 >> 153-236 >> 87, 95, 103-244 - nighttime: n/c Lowest sugar was 27 >> ...51 >> 83 >> 90 >> 62 in 09/2022; she has hypoglycemia awareness at 70.  Highest sugar was 366 >> 256 >> 278 >> 244.  Glucometer: One Touch ultra  - no CKD, last BUN/creatinine:  Lab Results  Component Value Date   BUN 19 03/29/2023   BUN 25 02/07/2023   CREATININE 0.87 03/29/2023   CREATININE 0.89 02/07/2023   Lab Results  Component Value Date   MICRALBCREAT 1.7 07/28/2023   MICRALBCREAT 9 04/19/2018   MICRALBCREAT 5 10/11/2016  She is off losartan 25 mg daily. She is allergic to Lisinopril. On Diltiazem 30 mg 2x a day.  - + HL; last set of lipids available for review: Lab Results  Component Value Date   CHOL 129 08/12/2023   HDL 68 08/12/2023   LDLCALC 43 08/12/2023   TRIG 96 08/12/2023   CHOLHDL 1.9 08/12/2023  She is on Zocor 80  mg daily, Zetia 10 mg daily.  - last eye exam was in 06/2023. No DR reportedly. [employer]. Glaucoma suspect.  - no numbness and tingling in her feet.  Last foot exam 07/28/2023.  She has a history of HTN, OA, GERD. In 11/2022, she was admitted with vomiting without nausea -found to have a NSTEMI and had 2 DES stents placed.  She developed PAF post MI, which converted to sinus rhythm spontaneously.  Of note, a lipase was normal.    ROS: + see HPI  Past Medical History:  Diagnosis Date   Arthritis    CAD (coronary artery disease) 03/28/2023   NSTEMI 11/2022 s/p 3 x 16 mm DES to the [facility], 3 x 20 mm DES to the mRCA    - LHC 12/13/2022: RCA ostial 95, mid 90; LCx mid 70; LAD mid 40, distal 70>> Med Rx for LAD, LCx   - TTE 12/09/2022: EF 50-55, no RWMA, mild concentric LVH, GR 2 DD,  normal RVSF, mild MR, RAP 15   Diabetes mellitus    since 2008   Essential hypertension, benign 09/10/2015   GERD (gastroesophageal reflux disease)    Hypercholesteremia    Hyperlipidemia    Stroke (HCC) 03/21/2011   only deficits-affected eyesight   Past Surgical History:  Procedure Laterality Date   BIOPSY  07/02/2022   Procedure: BIOPSY;  Surgeon: Dolores Frame, MD;  [facility];  Service: Gastroenterology;;   CATARACT EXTRACTION W/PHACO  11/01/2011   Procedure: CATARACT EXTRACTION PHACO AND INTRAOCULAR LENS PLACEMENT (IOC);  Surgeon: Gemma Payor, MD;  Location: [facility];  Service: Ophthalmology;  Laterality: Right;  CDE 12.48   CORONARY STENT INTERVENTION N/A 12/10/2022   Procedure: CORONARY STENT INTERVENTION;  Surgeon: Tonny Bollman, MD;  Location: [facility] INVASIVE CV LAB;  Service: Cardiovascular;  Laterality: N/A;   ESOPHAGEAL DILATION N/A 04/29/2017   Procedure: ESOPHAGEAL DILATION;  Surgeon: Malissa Hippo, MD;  [facility]: [facility];  Service: Endoscopy;  Laterality: N/A;   ESOPHAGOGASTRODUODENOSCOPY N/A 04/29/2017   Procedure: ESOPHAGOGASTRODUODENOSCOPY (EGD);  Surgeon: Malissa Hippo, MD;  [facility];  Service: Endoscopy;  Laterality: N/A;  1015   ESOPHAGOGASTRODUODENOSCOPY (EGD) WITH ESOPHAGEAL DILATION N/A 02/14/2013   Procedure: ESOPHAGOGASTRODUODENOSCOPY (EGD) WITH ESOPHAGEAL DILATION;  Surgeon: Malissa Hippo, MD;  [facility];  Service: Endoscopy;  Laterality: N/A;  200-moved to 255 Ann to notify pt   ESOPHAGOGASTRODUODENOSCOPY (EGD) WITH PROPOFOL N/A 05/28/2022   Procedure: ESOPHAGOGASTRODUODENOSCOPY (EGD) WITH PROPOFOL;  Surgeon: Dolores Frame, MD;  [facility];  Service: Gastroenterology;  Laterality: N/A;  1030 ASA 2   ESOPHAGOGASTRODUODENOSCOPY (EGD) WITH PROPOFOL N/A 07/02/2022   Procedure: ESOPHAGOGASTRODUODENOSCOPY (EGD) WITH PROPOFOL;  Surgeon: Dolores Frame, MD;  [facility];  Service: Gastroenterology;  Laterality: N/A;  105 ASA 2, pt knows new time per Soledad Gerlach   EYE SURGERY  10-2010   left eye removal of cataract   LEFT HEART CATH AND CORONARY ANGIOGRAPHY N/A 12/10/2022   Procedure: LEFT HEART CATH AND CORONARY ANGIOGRAPHY;  Surgeon: Tonny Bollman, MD;  Location: [facility] INVASIVE CV LAB;  Service: Cardiovascular;  Laterality: N/A;   TUBAL LIGATION  1984   Social History   Socioeconomic History   Marital status: Married    Spouse name: wayne   Number of children: 2   Years of education: 12   Highest education level: Not on file  Occupational History   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   Patient is right handed.   Patient has hs education.   Patient drinks 3-5 glasses of tea daily.   Social Drivers of Corporate investment banker Strain: Not on file  Food Insecurity: No Food Insecurity (12/10/2022)   Hunger Vital Sign    Worried About Running Out of Food in the Last Year: Never true    Ran Out of Food in the Last Year: Never true  Transportation Needs: No Transportation Needs (12/10/2022)   PRAPARE - Administrator, Civil Service (Medical): No    Lack of Transportation (Non-Medical): No  Physical Activity: Not on file  Stress: Not on file  Social Connections: Unknown (02/01/2022)   [facility]   Social Network    Social Network: Not on file  Intimate Partner Violence: Not At Risk (12/10/2022)   Humiliation, Afraid, Rape, and Kick questionnaire    Fear of Current or Ex-Partner: No     Emotionally Abused: No    Physically Abused: No    Sexually Abused: No   Current Outpatient Medications on File Prior to Visit  Medication Sig Dispense Refill   acetaminophen (TYLENOL) 650 MG CR tablet Take 650 mg by mouth every 8 (eight) hours as needed for pain.     Blood Glucose Monitoring Suppl (ONE TOUCH ULTRA 2) w/Device KIT : Use as instructed 4 x daily 1 kit 0   calcium carbonate (TUMS - DOSED IN MG ELEMENTAL CALCIUM) 500 MG chewable tablet Chew 1 tablet by mouth at bedtime as needed for indigestion or heartburn.     cholecalciferol (VITAMIN D3) 25 MCG (1000 UNIT) tablet Take 10 mcg by mouth daily.     clopidogrel (PLAVIX) 75 MG tablet Take 75 mg by mouth daily.     diltiazem (CARDIZEM) 30 MG tablet Take 1 tablet (30 mg total) by mouth 2 (two) times daily. Start on 04/10/23 180 tablet 3   Dulaglutide (TRULICITY) 1.5 MG/0.5ML SOAJ Inject 1.5 mg into the skin once a week. 8 mL 3   ezetimibe (ZETIA) 10 MG tablet Take 10 mg by mouth at bedtime.     GLOBAL EASE INJECT PEN NEEDLES 32G X 4 MM MISC USE 4 PENTIPS EVERY DAY 100 each 5   insulin aspart (NOVOLOG FLEXPEN) 100 UNIT/ML FlexPen Inject 10-12 Units into the skin 3 (three) times daily with meals. (Patient taking differently: Inject into the skin 3 (three) times daily with meals. 10-14 units prior to meals) 30 mL 3   insulin detemir (LEVEMIR) 100 UNIT/ML FlexPen Inject 27 Units into the skin at bedtime. (Patient taking differently: Inject 29 Units into the skin at bedtime.) 30 mL 3   Lancet Devices (LANCING DEVICE) MISC Use to check blood sugar four times a day SureComfort Brand 2 each 0   Lancets 30G MISC Use to check glucose 4-6 times a day 600 each 5   latanoprost (XALATAN) 0.005 % ophthalmic solution Place 1 drop into both eyes at bedtime.  11   metFORMIN (GLUCOPHAGE-XR) 500 MG 24  hr tablet TAKE 1 TABLET BY MOUTH EVERY DAY WITH BREAKFAST 90 tablet 1   Multiple Vitamin (ONE-A-DAY 55 PLUS PO) Take 1 tablet by mouth daily.      omeprazole  (PRILOSEC) 40 MG capsule Take 1 capsule (40 mg total) by mouth daily. 90 capsule 3   ONETOUCH ULTRA test strip TEST BLOOD SUGAR FOUR TIMES DAILY 400 strip 11   Probiotic Product (PROBIOTIC DAILY PO) Take 1 tablet by mouth daily.     rosuvastatin (CRESTOR) 40 MG tablet Take 1 tablet (40 mg total) by mouth daily. 90 tablet 3   timolol (BETIMOL) 0.5 % ophthalmic solution Place 1 drop into both eyes 2 (two) times daily.     vitamin C (ASCORBIC ACID) 500 MG tablet Take 500 mg by mouth daily.     vitamin E 180 MG (400 UNITS) capsule Take 400 Units by mouth daily.     No current facility-administered medications on file prior to visit.   Allergies  Allergen Reactions   Ampicillin Hives    Breaks out in bumps Has patient had a PCN reaction causing immediate rash, facial/tongue/throat swelling, SOB or lightheadedness with hypotension: No Has patient had a PCN reaction causing severe rash involving mucus membranes or skin necrosis: Yes Has patient had a PCN reaction that required hospitalization: No Has patient had a PCN reaction occurring within the last 10 years: No If all of the above answers are "NO", then may proceed with Cephalosporin use.    Bromday [Bromfenac Sodium] Itching and Swelling    Eye Drops   Lisinopril Cough   Tetanus Toxoids Hives    Breaks out in bumps   Family History  Problem Relation Age of Onset   Cancer Mother    Diabetes Mother    Cancer Father    Diabetes Father    Anesthesia problems Neg Hx    Hypotension Neg Hx    Malignant hyperthermia Neg Hx    Pseudochol deficiency Neg Hx    PE: BP 120/68   Pulse 84   Ht 5' (1.524 m)   Wt 124 lb 3.2 oz (56.3 kg)   SpO2 98%   BMI 24.26 kg/m  Wt Readings from Last 3 Encounters:  01/03/24 124 lb 3.2 oz (56.3 kg)  12/29/23 125 lb (56.7 kg)  11/25/23 122 lb (55.3 kg)   Constitutional: normal weight, in NAD Eyes: no exophthalmos ENT: no thyromegaly, no cervical lymphadenopathy Cardiovascular: RRR, No  MRG Respiratory: CTA B Musculoskeletal: no deformities Skin: no rashes Neurological: no tremor with outstretched hands  ASSESSMENT: 1. LADA, insulin-dependent, uncontrolled, with complications - NSTEMI 11/2022 - 2 DES stents - Cerebrovascular disease - h/o stroke (affecting eyesight only)  Component     Latest Ref Rng 05/27/2022  Glucose     65 - 99 mg/dL 657 (H)   Hemoglobin Q4O     4.0 - 5.6 % 6.9 !   Islet Cell Ab     Neg:<1:1  Negative   C-Peptide     0.80 - 3.85 ng/mL 1.85   ZNT8 Antibodies     <15 U/mL <10   Glutamic Acid Decarb Ab     <5 IU/mL >250 (H)    Normal insulin production, but elevated GAD Ab's.  These labs confirm LADA, which explains her blood sugar variability.   2. HL  3.  Thyroid nodules  PLAN:  1. Patient with longstanding, uncontrolled, LADA with undetectably high GAD antibodies in 05/2022.  We have her on metformin, weekly GLP-1 receptor agonist  and basal-bolus insulin, which we adjusted at last visit.  HbA1c at that time was stable, at 7.7%, above target.  She brought an excellent blood sugar log at that time and sugars were still quite fluctuating, with frequent highs in the 200s later in the day so increase her basal insulin and added a NovoLog sliding scale. - At today's visit, sugars have improved.  She only had 1 low blood sugar, in the 60s 3 months ago, but not since.  She does have occasional blood sugars in the 70s before meals.  We discussed that this is actually at goal.  She does feel when the sugars are dropping under 110, but we discussed that it is much more normal for the body to keep the blood sugars under 110 and I am hoping that she will do better within this range as the time goes by.  For now, I did not suggest to change her regimen but I did discuss with her that if she is dropping her sugars under 80 more frequently, she may reduce the Tresiba dose slightly.  Otherwise, for now, we will continue the same regimen. - I suggested to:   Patient Instructions  Please continue: - Metformin ER 500 mg 1x a day - Trulicity 1.5 mg weekly - Tresiba 30 units at bedtime (may try to decrease by 2-4 units if you have more sugars <80) - Novolog 10-12 units 3x a day, 10 min before meals Add the following NovoLog sliding scale: 175-200: + 1 unit 201-250: + 2 units 251-300: + 3 units 301-350: + 4 units >350: + 5 units  At bedtime, if sugars are >200, may take 3-4 units of Novolog for correction.  Please return in 3-4 months with your sugar log.   - we checked her HbA1c: 6.9% (lower) - advised to check sugars at different times of the day - 4x a day, rotating check times - advised for yearly eye exams >> she is UTD - will check an ACR today - return to clinic in 3-4 months  2. HL - Latest lipid panel was reviewed from 07/2023: All fractions at goal (LDL goal is less than 55 due to cardiovascular disease): Lab Results  Component Value Date   CHOL 129 08/12/2023   HDL 68 08/12/2023   LDLCALC 43 08/12/2023   TRIG 96 08/12/2023   CHOLHDL 1.9 08/12/2023  -She is on Zocor 40 mg daily and Zetia 10 mg daily without side effects  3.  Thyroid nodules -New problem for me  -Nodules were detected during carotid ultrasound in 12/2023. -She denies neck compression symptoms but does have a chronic cough -No family history of thyroid disease, thyroid nodules, or thyroid cancer -No personal history of radiation to head or neck -Will check a thyroid ultrasound  Orders Placed This Encounter  Procedures   US  THYROID   Microalbumin / creatinine urine ratio   Emilie Harden, MD PhD [facility] Endocrinology

## 2024-01-03 NOTE — Telephone Encounter (Signed)
 Patient advises that her pharmacy of choice is [facility] in [place]

## 2024-01-03 NOTE — Patient Instructions (Addendum)
 Please continue: - Metformin ER 500 mg 1x a day - Trulicity 1.5 mg weekly - Tresiba 30 units at bedtime (may try to decrease by 2-4 units if you have more sugars <80) - Novolog 10-12 units 3x a day, 10 min before meals Add the following NovoLog sliding scale: 175-200: + 1 unit 201-250: + 2 units 251-300: + 3 units 301-350: + 4 units >350: + 5 units  At bedtime, if sugars are >200, may take 3-4 units of Novolog for correction.  Please return in 3-4 months with your sugar log.

## 2024-01-04 LAB — MICROALBUMIN / CREATININE URINE RATIO
Creatinine, Urine: 151 mg/dL (ref 20–275)
Microalb Creat Ratio: 44 mg/g{creat} — ABNORMAL HIGH (ref <30)
Microalb, Ur: 6.6 mg/dL

## 2024-01-09 ENCOUNTER — Ambulatory Visit: Payer: PPO | Admitting: Internal Medicine

## 2024-01-10 NOTE — Telephone Encounter (Signed)
 Called pt advised of MD response "  I do not feel that Plavix  monotherapy is sufficient to keep her from having a stroke in the event she were to have atrial fibrillation. I would like you to consider an implantable loop recorder. She has had no further atrial fibrillation. She was asymptomatic when she previously had atrial fibrillation. If she would like to follow up with our team sooner, she can follow up with our team this fall. I would ask that if she reconsiders to let us  know so we can coordinate her care.   Pt expresses does not want to pursue ILR at this time.  Scheduled OV with MD for fall. Pt had no further questions or concerns.

## 2024-01-13 ENCOUNTER — Ambulatory Visit: Payer: PPO | Admitting: Internal Medicine

## 2024-01-17 ENCOUNTER — Telehealth: Payer: Self-pay

## 2024-01-17 NOTE — Telephone Encounter (Signed)
 J, Last foot exam was normal so I do not have any evidence of foot problems that can lead to amputations, but one option is to start seeing a podiatry once a year for  an annual diabetic foot exam.  If she decides to do so, I would recommend either [facility] or [facility].

## 2024-01-17 NOTE — Telephone Encounter (Signed)
 Pt called stating she has 2 people that she knows with DM and they have had to have some amputations and she is concerned if this is going to happen to her. She wants to make sure she is okay from you.

## 2024-01-18 NOTE — Telephone Encounter (Signed)
 Pt has been notifiednand voices understanding.

## 2024-01-19 ENCOUNTER — Ambulatory Visit
Admission: RE | Admit: 2024-01-19 | Discharge: 2024-01-19 | Disposition: A | Discharge status: Home / Self Care | Source: Ambulatory Visit | Attending: Internal Medicine | Admitting: Internal Medicine

## 2024-01-19 DIAGNOSIS — E042 Nontoxic multinodular goiter: Secondary | ICD-10-CM

## 2024-01-24 ENCOUNTER — Other Ambulatory Visit: Payer: Self-pay | Admitting: Internal Medicine

## 2024-01-24 ENCOUNTER — Telehealth: Payer: Self-pay

## 2024-01-24 DIAGNOSIS — E042 Nontoxic multinodular goiter: Secondary | ICD-10-CM

## 2024-01-24 NOTE — Telephone Encounter (Signed)
 Patient Assistance  Medication:Tresiba  Dosage:[id number] Quantity:3 boxes

## 2024-01-25 NOTE — Telephone Encounter (Signed)
 Patient came in to office today and picked up 3 boxes of patient assistance [place].

## 2024-01-30 ENCOUNTER — Telehealth: Payer: Self-pay

## 2024-01-30 NOTE — Telephone Encounter (Signed)
 Error

## 2024-02-02 ENCOUNTER — Other Ambulatory Visit (HOSPITAL_COMMUNITY)
Admission: RE | Admit: 2024-02-02 | Discharge: 2024-02-02 | Disposition: A | Discharge status: Home / Self Care | Source: Ambulatory Visit | Attending: Internal Medicine | Admitting: Internal Medicine

## 2024-02-02 ENCOUNTER — Ambulatory Visit
Admission: RE | Admit: 2024-02-02 | Discharge: 2024-02-02 | Disposition: A | Discharge status: Home / Self Care | Source: Ambulatory Visit | Attending: Internal Medicine | Admitting: Internal Medicine

## 2024-02-02 DIAGNOSIS — E042 Nontoxic multinodular goiter: Secondary | ICD-10-CM | POA: Insufficient documentation

## 2024-02-02 DIAGNOSIS — E041 Nontoxic single thyroid nodule: Secondary | ICD-10-CM | POA: Diagnosis not present

## 2024-02-02 DIAGNOSIS — C73 Malignant neoplasm of thyroid gland: Secondary | ICD-10-CM | POA: Diagnosis not present

## 2024-02-07 ENCOUNTER — Ambulatory Visit: Payer: Self-pay | Admitting: Internal Medicine

## 2024-02-07 DIAGNOSIS — H401131 Primary open-angle glaucoma, bilateral, mild stage: Secondary | ICD-10-CM | POA: Diagnosis not present

## 2024-02-07 LAB — CYTOLOGY - NON PAP

## 2024-02-07 NOTE — Telephone Encounter (Signed)
 I have attempted to call the patient on 02/07/2024 at 12:45 PM to discuss abnormal FNA results.   Left message for call back, will try again tomorrow

## 2024-02-07 NOTE — Telephone Encounter (Signed)
 Pt called and left me a vm and wanting to know her results. I did advise to her that the assistants do not typically give out these results but she wanted to know. I did tell her the information that I could but I told her that you would call her back tomorrow. And that if she had any questions she could ask you since I would not know the answer.

## 2024-02-08 ENCOUNTER — Telehealth: Payer: Self-pay | Admitting: Internal Medicine

## 2024-02-08 DIAGNOSIS — C73 Malignant neoplasm of thyroid gland: Secondary | ICD-10-CM

## 2024-02-21 ENCOUNTER — Telehealth: Payer: Self-pay

## 2024-02-21 DIAGNOSIS — E042 Nontoxic multinodular goiter: Secondary | ICD-10-CM | POA: Diagnosis not present

## 2024-02-21 DIAGNOSIS — C73 Malignant neoplasm of thyroid gland: Secondary | ICD-10-CM | POA: Diagnosis not present

## 2024-02-21 NOTE — Telephone Encounter (Signed)
   Pre-operative Risk Assessment    Patient Name: LENI PANKONIN  DOB: 1945/09/29 MRN: [id number]   Date of last office visit: 12/29/23 Date of next office visit: 05/29/24   Request for Surgical Clearance    Procedure:  Thyroidectomy   Date of Surgery:  Clearance TBD                                 Surgeon:  [name], MD  Surgeon's Group or Practice Name:  [facility]  Phone number:  [phone] Fax number:  [phone]   Type of Clearance Requested:   - Medical  - Pharmacy:  Hold Clopidogrel  (Plavix ) Not indicated    Type of Anesthesia:  General    Additional requests/questions:    Mansfield Seip   02/21/2024, 2:14 PM

## 2024-02-22 NOTE — Telephone Encounter (Signed)
   Patient Name: NIALAH SARAVIA  DOB: [birth year] MRN: [id number]  Primary Cardiologist: Jann Melody, MD  Chart reviewed as part of pre-operative protocol coverage. Given past medical history and time since last visit, based on ACC/AHA guidelines, ARASELY AKKERMAN is at acceptable risk for the planned procedure without further cardiovascular testing.   Patient has prior NSTEMI in 2024 but has been well compensated. She had one episode of AF but was not amenable for further testing to consider AC/ILR (post NSTEMI). Reasonable to proceed with surgery if no clinical changes she is over one year post, describes herself as asymptomatic. Can stop Plavix  5 days prior until cleared to restart by surgeon    The patient was advised that if she develops new symptoms prior to surgery to contact our office to arrange for a follow-up visit, and she verbalized understanding.  I will route this recommendation to the requesting party via Epic fax function and remove from pre-op pool.  Please call with questions.  Francene Ing, Retha Cast, NP 02/22/2024, 12:06 PM

## 2024-02-25 ENCOUNTER — Encounter (HOSPITAL_COMMUNITY): Payer: Self-pay | Admitting: *Deleted

## 2024-02-25 ENCOUNTER — Emergency Department (HOSPITAL_COMMUNITY)

## 2024-02-25 ENCOUNTER — Other Ambulatory Visit: Payer: Self-pay

## 2024-02-25 ENCOUNTER — Inpatient Hospital Stay (HOSPITAL_COMMUNITY)
Admission: EM | Admit: 2024-02-25 | Discharge: 2024-03-14 | DRG: 011 | Disposition: A | Discharge status: Hospice | Attending: Internal Medicine | Admitting: Internal Medicine

## 2024-02-25 DIAGNOSIS — I639 Cerebral infarction, unspecified: Secondary | ICD-10-CM | POA: Diagnosis present

## 2024-02-25 DIAGNOSIS — Z794 Long term (current) use of insulin: Secondary | ICD-10-CM | POA: Diagnosis not present

## 2024-02-25 DIAGNOSIS — D72829 Elevated white blood cell count, unspecified: Secondary | ICD-10-CM | POA: Diagnosis not present

## 2024-02-25 DIAGNOSIS — Z930 Tracheostomy status: Secondary | ICD-10-CM | POA: Diagnosis not present

## 2024-02-25 DIAGNOSIS — Z7984 Long term (current) use of oral hypoglycemic drugs: Secondary | ICD-10-CM | POA: Diagnosis not present

## 2024-02-25 DIAGNOSIS — I252 Old myocardial infarction: Secondary | ICD-10-CM | POA: Diagnosis not present

## 2024-02-25 DIAGNOSIS — I10 Essential (primary) hypertension: Secondary | ICD-10-CM | POA: Diagnosis not present

## 2024-02-25 DIAGNOSIS — H5347 Heteronymous bilateral field defects: Secondary | ICD-10-CM | POA: Diagnosis present

## 2024-02-25 DIAGNOSIS — Z7189 Other specified counseling: Secondary | ICD-10-CM | POA: Diagnosis not present

## 2024-02-25 DIAGNOSIS — I69391 Dysphagia following cerebral infarction: Secondary | ICD-10-CM | POA: Diagnosis not present

## 2024-02-25 DIAGNOSIS — R29707 NIHSS score 7: Secondary | ICD-10-CM | POA: Diagnosis not present

## 2024-02-25 DIAGNOSIS — R6510 Systemic inflammatory response syndrome (SIRS) of non-infectious origin without acute organ dysfunction: Secondary | ICD-10-CM | POA: Diagnosis not present

## 2024-02-25 DIAGNOSIS — R490 Dysphonia: Secondary | ICD-10-CM | POA: Diagnosis present

## 2024-02-25 DIAGNOSIS — Z833 Family history of diabetes mellitus: Secondary | ICD-10-CM | POA: Diagnosis not present

## 2024-02-25 DIAGNOSIS — G8194 Hemiplegia, unspecified affecting left nondominant side: Secondary | ICD-10-CM | POA: Diagnosis not present

## 2024-02-25 DIAGNOSIS — E119 Type 2 diabetes mellitus without complications: Secondary | ICD-10-CM | POA: Diagnosis not present

## 2024-02-25 DIAGNOSIS — Z515 Encounter for palliative care: Secondary | ICD-10-CM

## 2024-02-25 DIAGNOSIS — I63231 Cerebral infarction due to unspecified occlusion or stenosis of right carotid arteries: Secondary | ICD-10-CM | POA: Diagnosis not present

## 2024-02-25 DIAGNOSIS — Z7902 Long term (current) use of antithrombotics/antiplatelets: Secondary | ICD-10-CM

## 2024-02-25 DIAGNOSIS — I480 Paroxysmal atrial fibrillation: Secondary | ICD-10-CM | POA: Diagnosis not present

## 2024-02-25 DIAGNOSIS — E1065 Type 1 diabetes mellitus with hyperglycemia: Secondary | ICD-10-CM | POA: Diagnosis not present

## 2024-02-25 DIAGNOSIS — I6389 Other cerebral infarction: Secondary | ICD-10-CM | POA: Diagnosis not present

## 2024-02-25 DIAGNOSIS — Z66 Do not resuscitate: Secondary | ICD-10-CM | POA: Diagnosis not present

## 2024-02-25 DIAGNOSIS — J383 Other diseases of vocal cords: Secondary | ICD-10-CM | POA: Diagnosis not present

## 2024-02-25 DIAGNOSIS — Z4682 Encounter for fitting and adjustment of non-vascular catheter: Secondary | ICD-10-CM | POA: Diagnosis not present

## 2024-02-25 DIAGNOSIS — E1165 Type 2 diabetes mellitus with hyperglycemia: Secondary | ICD-10-CM | POA: Diagnosis not present

## 2024-02-25 DIAGNOSIS — Z7985 Long-term (current) use of injectable non-insulin antidiabetic drugs: Secondary | ICD-10-CM | POA: Diagnosis not present

## 2024-02-25 DIAGNOSIS — Z79899 Other long term (current) drug therapy: Secondary | ICD-10-CM

## 2024-02-25 DIAGNOSIS — E785 Hyperlipidemia, unspecified: Secondary | ICD-10-CM | POA: Diagnosis present

## 2024-02-25 DIAGNOSIS — R0602 Shortness of breath: Secondary | ICD-10-CM | POA: Diagnosis not present

## 2024-02-25 DIAGNOSIS — K219 Gastro-esophageal reflux disease without esophagitis: Secondary | ICD-10-CM | POA: Diagnosis present

## 2024-02-25 DIAGNOSIS — R509 Fever, unspecified: Secondary | ICD-10-CM | POA: Diagnosis not present

## 2024-02-25 DIAGNOSIS — Z880 Allergy status to penicillin: Secondary | ICD-10-CM

## 2024-02-25 DIAGNOSIS — K8020 Calculus of gallbladder without cholecystitis without obstruction: Secondary | ICD-10-CM | POA: Diagnosis not present

## 2024-02-25 DIAGNOSIS — I2510 Atherosclerotic heart disease of native coronary artery without angina pectoris: Secondary | ICD-10-CM | POA: Diagnosis present

## 2024-02-25 DIAGNOSIS — E041 Nontoxic single thyroid nodule: Secondary | ICD-10-CM | POA: Diagnosis not present

## 2024-02-25 DIAGNOSIS — G9389 Other specified disorders of brain: Secondary | ICD-10-CM | POA: Diagnosis not present

## 2024-02-25 DIAGNOSIS — R221 Localized swelling, mass and lump, neck: Secondary | ICD-10-CM | POA: Diagnosis present

## 2024-02-25 DIAGNOSIS — R29818 Other symptoms and signs involving the nervous system: Secondary | ICD-10-CM | POA: Diagnosis not present

## 2024-02-25 DIAGNOSIS — H409 Unspecified glaucoma: Secondary | ICD-10-CM | POA: Diagnosis present

## 2024-02-25 DIAGNOSIS — E7800 Pure hypercholesterolemia, unspecified: Secondary | ICD-10-CM | POA: Diagnosis not present

## 2024-02-25 DIAGNOSIS — E079 Disorder of thyroid, unspecified: Principal | ICD-10-CM

## 2024-02-25 DIAGNOSIS — R609 Edema, unspecified: Secondary | ICD-10-CM | POA: Diagnosis not present

## 2024-02-25 DIAGNOSIS — Z955 Presence of coronary angioplasty implant and graft: Secondary | ICD-10-CM

## 2024-02-25 DIAGNOSIS — I6523 Occlusion and stenosis of bilateral carotid arteries: Secondary | ICD-10-CM | POA: Diagnosis not present

## 2024-02-25 DIAGNOSIS — Z809 Family history of malignant neoplasm, unspecified: Secondary | ICD-10-CM

## 2024-02-25 DIAGNOSIS — Z887 Allergy status to serum and vaccine status: Secondary | ICD-10-CM

## 2024-02-25 DIAGNOSIS — R29706 NIHSS score 6: Secondary | ICD-10-CM | POA: Diagnosis not present

## 2024-02-25 DIAGNOSIS — H53462 Homonymous bilateral field defects, left side: Secondary | ICD-10-CM | POA: Diagnosis not present

## 2024-02-25 DIAGNOSIS — E876 Hypokalemia: Secondary | ICD-10-CM | POA: Diagnosis not present

## 2024-02-25 DIAGNOSIS — I214 Non-ST elevation (NSTEMI) myocardial infarction: Secondary | ICD-10-CM | POA: Diagnosis not present

## 2024-02-25 DIAGNOSIS — R590 Localized enlarged lymph nodes: Secondary | ICD-10-CM | POA: Diagnosis not present

## 2024-02-25 DIAGNOSIS — I700 Atherosclerosis of aorta: Secondary | ICD-10-CM | POA: Diagnosis not present

## 2024-02-25 DIAGNOSIS — I69398 Other sequelae of cerebral infarction: Secondary | ICD-10-CM

## 2024-02-25 DIAGNOSIS — M1990 Unspecified osteoarthritis, unspecified site: Secondary | ICD-10-CM | POA: Diagnosis present

## 2024-02-25 DIAGNOSIS — Z7401 Bed confinement status: Secondary | ICD-10-CM | POA: Diagnosis not present

## 2024-02-25 DIAGNOSIS — T17920A Food in respiratory tract, part unspecified causing asphyxiation, initial encounter: Secondary | ICD-10-CM | POA: Diagnosis not present

## 2024-02-25 DIAGNOSIS — E042 Nontoxic multinodular goiter: Secondary | ICD-10-CM | POA: Diagnosis not present

## 2024-02-25 DIAGNOSIS — Z888 Allergy status to other drugs, medicaments and biological substances status: Secondary | ICD-10-CM

## 2024-02-25 DIAGNOSIS — C73 Malignant neoplasm of thyroid gland: Secondary | ICD-10-CM | POA: Diagnosis not present

## 2024-02-25 DIAGNOSIS — K2270 Barrett's esophagus without dysplasia: Secondary | ICD-10-CM | POA: Diagnosis present

## 2024-02-25 DIAGNOSIS — H9190 Unspecified hearing loss, unspecified ear: Secondary | ICD-10-CM | POA: Diagnosis present

## 2024-02-25 DIAGNOSIS — R710 Precipitous drop in hematocrit: Secondary | ICD-10-CM | POA: Diagnosis not present

## 2024-02-25 DIAGNOSIS — Z510 Encounter for antineoplastic radiation therapy: Secondary | ICD-10-CM | POA: Diagnosis not present

## 2024-02-25 DIAGNOSIS — Z471 Aftercare following joint replacement surgery: Secondary | ICD-10-CM | POA: Diagnosis not present

## 2024-02-25 LAB — BASIC METABOLIC PANEL WITH GFR
Anion gap: 10 (ref 5–15)
BUN: 15 mg/dL (ref 8–23)
CO2: 26 mmol/L (ref 22–32)
Calcium: 10.2 mg/dL (ref 8.9–10.3)
Chloride: 102 mmol/L (ref 98–111)
Creatinine, Ser: 0.73 mg/dL (ref 0.44–1.00)
GFR, Estimated: >60 mL/min (ref >60)
Glucose, Bld: 184 mg/dL — ABNORMAL HIGH (ref 70–99)
Potassium: 4.2 mmol/L (ref 3.5–5.1)
Sodium: 138 mmol/L (ref 135–145)

## 2024-02-25 LAB — GLUCOSE, CAPILLARY: Glucose-Capillary: 158 mg/dL — ABNORMAL HIGH (ref 70–99)

## 2024-02-25 LAB — CBC
HCT: 34.7 % — ABNORMAL LOW (ref 36.0–46.0)
Hemoglobin: 11.5 g/dL — ABNORMAL LOW (ref 12.0–15.0)
MCH: 29 pg (ref 26.0–34.0)
MCHC: 33.1 g/dL (ref 30.0–36.0)
MCV: 87.4 fL (ref 80.0–100.0)
Platelets: 349 10*3/uL (ref 150–400)
RBC: 3.97 MIL/uL (ref 3.87–5.11)
RDW: 13.8 % (ref 11.5–15.5)
WBC: 13.8 10*3/uL — ABNORMAL HIGH (ref 4.0–10.5)
nRBC: 0 % (ref 0.0–0.2)

## 2024-02-25 LAB — CBG MONITORING, ED: Glucose-Capillary: 131 mg/dL — ABNORMAL HIGH (ref 70–99)

## 2024-02-25 MED ORDER — ACETAMINOPHEN 325 MG PO TABS: 650.0000 mg | ORAL_TABLET | Freq: Four times a day (QID) | ORAL | Status: DC | PRN

## 2024-02-25 MED ORDER — TIMOLOL MALEATE 0.5 % OP SOLN
1.0000 [drp] | Freq: Two times a day (BID) | OPHTHALMIC | Status: DC
  Administered 2024-02-25 – 2024-03-13 (×35): 1 [drp] via OPHTHALMIC
  Filled 2024-02-25 (×3): qty 5

## 2024-02-25 MED ORDER — IOHEXOL 300 MG/ML  SOLN
75.0000 mL | Freq: Once | INTRAMUSCULAR | Status: AC | PRN
  Administered 2024-02-25: 75 mL via INTRAVENOUS

## 2024-02-25 MED ORDER — ACETAMINOPHEN 650 MG RE SUPP: 650.0000 mg | Freq: Four times a day (QID) | RECTAL | Status: DC | PRN

## 2024-02-25 MED ORDER — DILTIAZEM HCL 30 MG PO TABS
30.0000 mg | ORAL_TABLET | Freq: Two times a day (BID) | ORAL | Status: DC
  Administered 2024-02-25 – 2024-02-29 (×8): 30 mg via ORAL
  Filled 2024-02-25 (×8): qty 1

## 2024-02-25 MED ORDER — HEPARIN SODIUM (PORCINE) 5000 UNIT/ML IJ SOLN
5000.0000 [IU] | Freq: Three times a day (TID) | INTRAMUSCULAR | Status: DC
  Administered 2024-02-26 – 2024-03-13 (×49): 5000 [IU] via SUBCUTANEOUS
  Filled 2024-02-25 (×49): qty 1

## 2024-02-25 MED ORDER — HYDRALAZINE HCL 20 MG/ML IJ SOLN: 5.0000 mg | INTRAMUSCULAR | Status: DC | PRN

## 2024-02-25 MED ORDER — MORPHINE SULFATE (PF) 2 MG/ML IV SOLN
2.0000 mg | Freq: Once | INTRAVENOUS | Status: AC
  Administered 2024-02-25: 2 mg via INTRAVENOUS
  Filled 2024-02-25: qty 1

## 2024-02-25 MED ORDER — INSULIN DEGLUDEC 200 UNIT/ML ~~LOC~~ SOPN: 10.0000 [IU] | PEN_INJECTOR | Freq: Every day | SUBCUTANEOUS | Status: DC

## 2024-02-25 MED ORDER — INSULIN ASPART 100 UNIT/ML IJ SOLN
0.0000 [IU] | INTRAMUSCULAR | Status: DC
  Administered 2024-02-26: 2 [IU] via SUBCUTANEOUS
  Administered 2024-02-26 – 2024-02-27 (×2): 1 [IU] via SUBCUTANEOUS
  Administered 2024-02-27: 2 [IU] via SUBCUTANEOUS
  Administered 2024-02-28: 1 [IU] via SUBCUTANEOUS
  Administered 2024-02-29: 3 [IU] via SUBCUTANEOUS
  Administered 2024-02-29: 1 [IU] via SUBCUTANEOUS
  Administered 2024-02-29: 3 [IU] via SUBCUTANEOUS
  Administered 2024-03-01: 1 [IU] via SUBCUTANEOUS

## 2024-02-25 MED ORDER — PANTOPRAZOLE SODIUM 40 MG PO TBEC
40.0000 mg | DELAYED_RELEASE_TABLET | Freq: Every day | ORAL | Status: DC
  Administered 2024-02-25 – 2024-03-02 (×7): 40 mg via ORAL
  Filled 2024-02-25 (×7): qty 1

## 2024-02-25 MED ORDER — MORPHINE SULFATE (PF) 2 MG/ML IV SOLN: 2.0000 mg | INTRAVENOUS | Status: DC | PRN

## 2024-02-25 MED ORDER — TIMOLOL HEMIHYDRATE 0.5 % OP SOLN: 1.0000 [drp] | Freq: Two times a day (BID) | OPHTHALMIC | Status: DC

## 2024-02-25 MED ORDER — LACTATED RINGERS IV SOLN: INTRAVENOUS | Status: DC

## 2024-02-25 MED ORDER — ONDANSETRON HCL 4 MG PO TABS: 4.0000 mg | ORAL_TABLET | Freq: Four times a day (QID) | ORAL | Status: DC | PRN

## 2024-02-25 MED ORDER — ROSUVASTATIN CALCIUM 20 MG PO TABS
40.0000 mg | ORAL_TABLET | Freq: Every day | ORAL | Status: DC
  Administered 2024-02-25 – 2024-03-02 (×7): 40 mg via ORAL
  Filled 2024-02-25 (×7): qty 2

## 2024-02-25 MED ORDER — LATANOPROST 0.005 % OP SOLN
1.0000 [drp] | Freq: Every day | OPHTHALMIC | Status: DC
  Administered 2024-02-25 – 2024-03-13 (×18): 1 [drp] via OPHTHALMIC
  Filled 2024-02-25 (×3): qty 2.5

## 2024-02-25 MED ORDER — HYDROCODONE-ACETAMINOPHEN 5-325 MG PO TABS: 1.0000 | ORAL_TABLET | ORAL | Status: DC | PRN

## 2024-02-25 MED ORDER — ONDANSETRON HCL 4 MG/2ML IJ SOLN
4.0000 mg | Freq: Four times a day (QID) | INTRAMUSCULAR | Status: DC | PRN
  Administered 2024-02-29: 4 mg via INTRAVENOUS

## 2024-02-25 MED ORDER — INSULIN GLARGINE-YFGN 100 UNIT/ML ~~LOC~~ SOLN
8.0000 [IU] | Freq: Every day | SUBCUTANEOUS | Status: DC
  Administered 2024-02-25 – 2024-03-02 (×6): 8 [IU] via SUBCUTANEOUS
  Filled 2024-02-25 (×9): qty 0.08

## 2024-02-25 NOTE — ED Notes (Addendum)
 Pt requesting water --Per [place], MD, pt is NPO except sips with meds; However, he states pt may have some ice chips. Pt did not want any ice chips when offered

## 2024-02-25 NOTE — ED Provider Notes (Addendum)
 [facility] AT [facility] Provider Note   CSN: [id number] Arrival date & time: 02/25/24  1105     History  Chief Complaint  Patient presents with   having to clear throat   Neck Pain   thyroid  mass    Nancy Sutton is a 78 y.o. female.  Pt with hx recently diagnosed thyroid  cancer/right thyroid  nodule, with c/o pain to right neck ever since needle biopsy 01/2024, and indicates is past couple days, feels as if phlegm stuck in throat, repetitively having to clear her throat, not able to clear throat. Family indicates had two prior milder episodes that improved, but this has been worse/persisted since yesterday. Family has noted some progressive hoarseness in past 1-2 weeks. Denies 'sore throat'. No sense of unilateral throat swelling. Externally no acutely worsening swelling. No skin changes or redness. No fever or chills. Denies new or worsening cough. No sob.   The history is provided by the patient, a relative and medical records.  Shortness of Breath Associated symptoms: no abdominal pain, no chest pain, no cough, no fever, no neck pain, no rash, no sore throat and no vomiting        Home Medications Prior to Admission medications   Medication Sig Start Date End Date Taking? Authorizing Provider  acetaminophen  (TYLENOL ) 650 MG CR tablet Take 650 mg by mouth every 8 (eight) hours as needed for pain.    [provider]  Blood Glucose Monitoring Suppl (ONE TOUCH ULTRA 2) w/Device KIT : Use as instructed 4 x daily 11/05/20   Gwyndolyn Lerner, MD  calcium  carbonate (TUMS - DOSED IN MG ELEMENTAL CALCIUM ) 500 MG chewable tablet Chew 1 tablet by mouth at bedtime as needed for indigestion or heartburn.    [provider]  cholecalciferol (VITAMIN D3) 25 MCG (1000 UNIT) tablet Take 10 mcg by mouth daily.    [provider]  clopidogrel  (PLAVIX ) 75 MG tablet Take 75 mg by mouth daily. 12/26/23   [provider]  diltiazem   (CARDIZEM ) 30 MG tablet Take 1 tablet (30 mg total) by mouth 2 (two) times daily. Start on 04/10/23 12/29/23 12/28/24  Jann Melody, MD  Dulaglutide  (TRULICITY ) 1.5 MG/0.5ML SOAJ Inject 1.5 mg into the skin once a week. 07/26/23   Emilie Harden, MD  ezetimibe (ZETIA) 10 MG tablet Take 10 mg by mouth at bedtime.    [provider]  GLOBAL EASE INJECT PEN NEEDLES 32G X 4 MM MISC USE 4 PENTIPS EVERY DAY 12/29/15   Nida, Jaynee Meyer, MD  insulin  aspart (NOVOLOG  FLEXPEN) 100 UNIT/ML FlexPen Inject 10-12 Units into the skin 3 (three) times daily with meals. Patient taking differently: Inject into the skin 3 (three) times daily with meals. 10-14 units prior to meals 05/27/22   Emilie Harden, MD  insulin  degludec (TRESIBA FLEXTOUCH) 200 UNIT/ML FlexTouch Pen Inject 30 Units into the skin.    [provider]  Lancet Devices (LANCING DEVICE) MISC Use to check blood sugar four times a day SureComfort Brand 09/09/23   Emilie Harden, MD  Lancets 30G MISC Use to check glucose 4-6 times a day 09/15/23   Emilie Harden, MD  latanoprost  (XALATAN ) 0.005 % ophthalmic solution Place 1 drop into both eyes at bedtime. 03/22/17   [provider]  metFORMIN  (GLUCOPHAGE -XR) 500 MG 24 hr tablet TAKE 1 TABLET BY MOUTH EVERY DAY WITH BREAKFAST 11/04/23   Emilie Harden, MD  Multiple Vitamin (ONE-A-DAY 55 PLUS PO) Take 1 tablet by mouth  daily.     [provider]  omeprazole  (PRILOSEC) 40 MG capsule Take 1 capsule (40 mg total) by mouth daily. 05/02/23   Urban Garden, MD  [facility] ULTRA test strip TEST BLOOD SUGAR FOUR TIMES DAILY 04/01/23   Lajean Pike, MD  Probiotic Product (PROBIOTIC DAILY PO) Take 1 tablet by mouth daily.    [provider]  rosuvastatin  (CRESTOR ) 40 MG tablet Take 1 tablet (40 mg total) by mouth daily. 05/10/23 08/08/23  Marlyse Single T, PA-C  timolol  (BETIMOL ) 0.5 % ophthalmic solution Place 1 drop into both eyes 2 (two) times  daily.    [provider]  vitamin C (ASCORBIC ACID) 500 MG tablet Take 500 mg by mouth daily.    [provider]  vitamin E 180 MG (400 UNITS) capsule Take 400 Units by mouth daily.    [provider]      Allergies    Ampicillin, Bromday [bromfenac sodium], Lisinopril, and Tetanus toxoids    Review of Systems   Review of Systems  Constitutional:  Negative for chills and fever.  HENT:  Negative for sore throat and trouble swallowing.   Eyes:  Negative for redness.  Respiratory:  Negative for cough and shortness of breath.   Cardiovascular:  Negative for chest pain.  Gastrointestinal:  Negative for abdominal pain and vomiting.  Musculoskeletal:  Negative for neck pain and neck stiffness.  Skin:  Negative for rash.    Physical Exam Updated Vital Signs BP (!) 153/57   Pulse 83   Temp 98.1 F (36.7 C) (Oral)   Resp 16   Ht 1.524 m (5')   Wt 54.4 kg   SpO2 96%   BMI 23.44 kg/m  Physical Exam Vitals and nursing note reviewed.  Constitutional:      Appearance: Normal appearance. She is well-developed.  HENT:     Head: Atraumatic.     Nose: Nose normal.     Mouth/Throat:     Mouth: Mucous membranes are moist.     Pharynx: Oropharynx is clear. No oropharyngeal exudate or posterior oropharyngeal erythema.  Eyes:     General: No scleral icterus.    Conjunctiva/sclera: Conjunctivae normal.  Neck:     Trachea: No tracheal deviation.     Comments: Trachea midline. Right thyroid  mass. No severe or tense neck swelling or hematoma or abscess noted.  Cardiovascular:     Rate and Rhythm: Normal rate and regular rhythm.     Pulses: Normal pulses.     Heart sounds: Normal heart sounds. No murmur heard.    No friction rub. No gallop.  Pulmonary:     Effort: Pulmonary effort is normal. No respiratory distress.     Breath sounds: Normal breath sounds. No stridor.  Abdominal:     General: There is no distension.     Palpations: Abdomen is soft.      Tenderness: There is no abdominal tenderness.  Musculoskeletal:        General: No swelling.     Cervical back: Normal range of motion and neck supple. No rigidity. No muscular tenderness.  Skin:    General: Skin is warm and dry.     Findings: No rash. Erythema: s. Neurological:     Mental Status: She is alert.     Comments: Alert, speech normal.   Psychiatric:        Mood and Affect: Mood normal.     ED Results / Procedures / Treatments   Labs (all labs  ordered are listed, but only abnormal results are displayed) Results for orders placed or performed during the hospital encounter of 02/25/24  Basic metabolic panel with GFR   Collection Time: 02/25/24 12:17 PM  Result Value Ref Range   Sodium 138 135 - 145 mmol/L   Potassium 4.2 3.5 - 5.1 mmol/L   Chloride 102 98 - 111 mmol/L   CO2 26 22 - 32 mmol/L   Glucose, Bld 184 (H) 70 - 99 mg/dL   BUN 15 8 - 23 mg/dL   Creatinine, Ser 1.61 0.44 - 1.00 mg/dL   Calcium  10.2 8.9 - 10.3 mg/dL   GFR, Estimated >09 >60 mL/min   Anion gap 10 5 - 15  CBC   Collection Time: 02/25/24 12:17 PM  Result Value Ref Range   WBC 13.8 (H) 4.0 - 10.5 K/uL   RBC 3.97 3.87 - 5.11 MIL/uL   Hemoglobin 11.5 (L) 12.0 - 15.0 g/dL   HCT 45.4 (L) 09.8 - 11.9 %   MCV 87.4 80.0 - 100.0 fL   MCH 29.0 26.0 - 34.0 pg   MCHC 33.1 30.0 - 36.0 g/dL   RDW 14.7 82.9 - 56.2 %   Platelets 349 150 - 400 K/uL   nRBC 0.0 0.0 - 0.2 %   CT Soft Tissue Neck W Contrast Result Date: 02/25/2024 CLINICAL DATA:  Thyroid  nodule, recent diagnosis of thyroid  cancer. Neck pain, throat clearing, concern for new mass or swelling. EXAM: CT NECK WITH CONTRAST TECHNIQUE: Multidetector CT imaging of the neck was performed using the standard protocol following the bolus administration of intravenous contrast. RADIATION DOSE REDUCTION: This exam was performed according to the departmental dose-optimization program which includes automated exposure control, adjustment of the mA and/or kV  according to patient size and/or use of iterative reconstruction technique. CONTRAST:  75mL OMNIPAQUE  IOHEXOL  300 MG/ML  SOLN COMPARISON:  None Available. FINDINGS: There is a 4.1 x 2.5 x 3.9 cm heterogeneous centrally hypoattenuating mass centered in the right lower neck at the level of the thyroid  which demonstrates mild areas of peripherally enhancing soft tissue. There is a prominent calcification within the central/posterior aspect of the mass measuring up to 1.2 cm in diameter. There is associated mass effect with leftward deviation of the airway from the supraglottic airway to the extra thoracic trachea. The medial aspect of the mass abuts and possibly involves the right aspect of the thyroid  cartilage. There is retropharyngeal extension of the mass at the level of the cricoid cartilage. The mass abuts the right lateral aspect of the hypopharynx and cervical esophagus. There is retropharyngeal edema noted extending from C2 to the level of C5 measuring up to 6 mm in thickness. There is additional edema abutting the right carotid space without evidence of significant vascular narrowing. Additional masslike peripherally enhancing soft tissue involving the right sternocleidomastoid muscle observed on series 2 images 52-60. No retrosternal extension of the mass. Pharynx and larynx: Large thyroid  mass as above with mass effect on the airway. The nasopharynx is symmetric. Symmetric appearance of the palatine tonsils. The oral cavity, floor of mouth, and base of tongue are unremarkable. Normal appearance of the epiglottis. Asymmetry of the piriform sinuses noted. The paraglottic fat appears unremarkable. There is asymmetry of the right laryngeal ventricle with slight medial positioning of the right vocal folds. Salivary glands: No inflammation, mass, or stone. Thyroid : Right thyroid  mass as above. Subcentimeter nodule in the inferior left thyroid  lobe. Lymph nodes: Right level 2A cervical node measuring 0.8 cm in  short axis with possible central hypoattenuation concerning for central necrosis (series 2, image 42). Additional right level 2 B/3 cervical nodes measuring up to 1.0 cm in short axis. Vascular: Moderate atherosclerosis of the visualized aortic arch. Thyroid  mass abuts the right common carotid artery with lateral displacement of the vessel. There is edema surrounding the right common carotid artery with questionable wall thickening without evidence of vascular narrowing. Atherosclerosis at the carotid bifurcation likely with at least moderate stenosis. Limited intracranial: Limited visualization of intracranial structures without acute abnormality. Visualized orbits: Bilateral lens replacement. Orbits otherwise unremarkable. Mastoids and visualized paranasal sinuses: Mild mucosal thickening in the ethmoid sinuses. Mastoid air cells are clear. Skeleton: No acute or aggressive process. Upper chest: Negative. Other: None. IMPRESSION: 4.1 x 2.5 x 3.9 cm heterogeneous mass in the right anterior neck at the level of the thyroid  concerning for residual/recurrent thyroid  malignancy. Associated mass effect and leftward shift of the airway without significant airway narrowing. Irregularity of the right aspect of the thyroid  cartilage adjacent to the mass concerning for possible invasion. 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 Recommend correlation with direct visualization. Retropharyngeal extension of the mass which abuts the lower hypopharynx/cervical esophagus. Associated retropharyngeal edema. Edema noted along the right carotid space without significant vascular narrowing. Enlarged right level 2 and level 3 cervical nodes several of which demonstrate hypoattenuation concerning for metastatic lymphadenopathy with central necrosis. Lateral displacement of the right common carotid artery. Edema along the vessel with possible wall thickening which may reflect reactive inflammatory changes. No significant  irregularity or narrowing to suggest vascular invasion. Electronically Signed   By: Denny Flack M.D.   On: 02/25/2024 13:47   US  FNA BX THYROID  1ST LESION AFIRMA Result Date: 02/02/2024 INDICATION: Indeterminate right mid thyroid  nodule EXAM: ULTRASOUND GUIDED FINE NEEDLE ASPIRATION OF INDETERMINATE THYROID  NODULE COMPARISON:  Ultrasound thyroid  01/19/2024 MEDICATIONS: 1% lidocaine  3 mL COMPLICATIONS: None immediate. TECHNIQUE: Informed written consent was obtained from the patient after a discussion of the risks, benefits and alternatives to treatment. Questions regarding the procedure were encouraged and answered. A timeout was performed prior to the initiation of the procedure. Pre-procedural ultrasound scanning demonstrated unchanged size and appearance of the indeterminate nodule within the right mid thyroid . The procedure was planned. The neck was prepped in the usual sterile fashion, and a sterile drape was applied covering the operative field. A timeout was performed prior to the initiation of the procedure. Local anesthesia was provided with 1% lidocaine . Under direct ultrasound guidance, 5 FNA biopsies were performed of the right mid thyroid  nodule with a 25 gauge needle. Multiple ultrasound images were saved for procedural documentation purposes. The samples were prepared and submitted to pathology. Two of these samples were prepared for Afirma testing. Limited post procedural scanning was negative for hematoma or additional complication. Dressings were placed. The patient tolerated the above procedures procedure well without immediate postprocedural complication. FINDINGS: Nodule reference number based on prior diagnostic ultrasound: 2 Maximum size: 1.8 cm Location: Right; mid ACR TI-RADS risk category: TR 4 Reason for biopsy: meets ACR TI-RADS criteria Ultrasound imaging confirms appropriate placement of the needles within the thyroid  nodule. IMPRESSION: Technically successful ultrasound guided  fine needle aspiration of right mid thyroid  nodule. Procedure performed by Jetta Morrow, NP Electronically Signed   By: Fernando Hoyer M.D.   On: 02/02/2024 11:12    EKG None  Radiology CT Soft Tissue Neck W Contrast Result Date: 02/25/2024 CLINICAL DATA:  Thyroid  nodule, recent diagnosis of thyroid  cancer. Neck pain, throat  clearing, concern for new mass or swelling. EXAM: CT NECK WITH CONTRAST TECHNIQUE: Multidetector CT imaging of the neck was performed using the standard protocol following the bolus administration of intravenous contrast. RADIATION DOSE REDUCTION: This exam was performed according to the departmental dose-optimization program which includes automated exposure control, adjustment of the mA and/or kV according to patient size and/or use of iterative reconstruction technique. CONTRAST:  75mL OMNIPAQUE  IOHEXOL  300 MG/ML  SOLN COMPARISON:  None Available. FINDINGS: There is a 4.1 x 2.5 x 3.9 cm heterogeneous centrally hypoattenuating mass centered in the right lower neck at the level of the thyroid  which demonstrates mild areas of peripherally enhancing soft tissue. There is a prominent calcification within the central/posterior aspect of the mass measuring up to 1.2 cm in diameter. There is associated mass effect with leftward deviation of the airway from the supraglottic airway to the extra thoracic trachea. The medial aspect of the mass abuts and possibly involves the right aspect of the thyroid  cartilage. There is retropharyngeal extension of the mass at the level of the cricoid cartilage. The mass abuts the right lateral aspect of the hypopharynx and cervical esophagus. There is retropharyngeal edema noted extending from C2 to the level of C5 measuring up to 6 mm in thickness. There is additional edema abutting the right carotid space without evidence of significant vascular narrowing. Additional masslike peripherally enhancing soft tissue involving the right sternocleidomastoid  muscle observed on series 2 images 52-60. No retrosternal extension of the mass. Pharynx and larynx: Large thyroid  mass as above with mass effect on the airway. The nasopharynx is symmetric. Symmetric appearance of the palatine tonsils. The oral cavity, floor of mouth, and base of tongue are unremarkable. Normal appearance of the epiglottis. Asymmetry of the piriform sinuses noted. The paraglottic fat appears unremarkable. There is asymmetry of the right laryngeal ventricle with slight medial positioning of the right vocal folds. Salivary glands: No inflammation, mass, or stone. Thyroid : Right thyroid  mass as above. Subcentimeter nodule in the inferior left thyroid  lobe. Lymph nodes: Right level 2A cervical node measuring 0.8 cm in short axis with possible central hypoattenuation concerning for central necrosis (series 2, image 42). Additional right level 2 B/3 cervical nodes measuring up to 1.0 cm in short axis. Vascular: Moderate atherosclerosis of the visualized aortic arch. Thyroid  mass abuts the right common carotid artery with lateral displacement of the vessel. There is edema surrounding the right common carotid artery with questionable wall thickening without evidence of vascular narrowing. Atherosclerosis at the carotid bifurcation likely with at least moderate stenosis. Limited intracranial: Limited visualization of intracranial structures without acute abnormality. Visualized orbits: Bilateral lens replacement. Orbits otherwise unremarkable. Mastoids and visualized paranasal sinuses: Mild mucosal thickening in the ethmoid sinuses. Mastoid air cells are clear. Skeleton: No acute or aggressive process. Upper chest: Negative. Other: None. IMPRESSION: 4.1 x 2.5 x 3.9 cm heterogeneous mass in the right anterior neck at the level of the thyroid  concerning for residual/recurrent thyroid  malignancy. Associated mass effect and leftward shift of the airway without significant airway narrowing. Irregularity of the  right aspect of the thyroid  cartilage adjacent to the mass concerning for possible invasion. 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 Recommend correlation with direct visualization. Retropharyngeal extension of the mass which abuts the lower hypopharynx/cervical esophagus. Associated retropharyngeal edema. Edema noted along the right carotid space without significant vascular narrowing. Enlarged right level 2 and level 3 cervical nodes several of which demonstrate hypoattenuation concerning for metastatic lymphadenopathy with central necrosis. Lateral displacement of  the right common carotid artery. Edema along the vessel with possible wall thickening which may reflect reactive inflammatory changes. No significant irregularity or narrowing to suggest vascular invasion. Electronically Signed   By: Denny Flack M.D.   On: 02/25/2024 13:47    Procedures Procedures    Medications Ordered in ED Medications  morphine (PF) 2 MG/ML injection 2 mg (2 mg Intravenous Given 02/25/24 1232)  iohexol  (OMNIPAQUE ) 300 MG/ML solution 75 mL (75 mLs Intravenous Contrast Given 02/25/24 1300)    ED Course/ Medical Decision Making/ A&P                                 Medical Decision Making Problems Addressed: Hoarseness: acute illness or injury Neck mass: acute illness or injury with systemic symptoms that poses a threat to life or bodily functions    Details: Acute on recent chronic Thyroid  cancer [facility]): acute illness or injury with systemic symptoms that poses a threat to life or bodily functions Thyroid  mass: acute illness or injury    Details: Acute/chronic Vocal cord dysfunction: acute illness or injury  Amount and/or Complexity of Data Reviewed Independent Historian: spouse    Details: hx External Data Reviewed: notes. Labs: ordered. Decision-making details documented in ED Course. Radiology: ordered and independent interpretation performed. Decision-making details documented in ED  Course. Discussion of management or test interpretation with external provider(s): Gen surg, hospitalists  Risk Prescription drug management. Decision regarding hospitalization.   Iv ns.  Labs ordered/sent. Imaging ordered.   Differential diagnosis includes enlarging neck mass, hematoma, infection, etc. Dispo decision including potential need for admission considered - will get labs and imaging and reassess.   Reviewed nursing notes and prior charts for additional history. External reports reviewed. Additional history from:family.  Labs reviewed/interpreted by me - chem largely unremarkable. Wbc 13, hgb 11.   CT reviewed/interpreted by me - +thyroid  mass with mass effect/changes, airway grossly intact.  ?symptoms due to mass effect and/or vocal cord dysfunction/paralysis.   Pt requests pain med - provided.   Pt has seen general surgery for same - will consult. Discussed pt and CT with Dr Alethea Andes, on call for Dr Sofia Dunn - he indicates given ct findings, symptoms, etc. Could admit to hospitalists at [facility], have ENT consult, etc.   Hospitalists consulted for admission - discussed pt, will admit to [facility], will have gen surg and ent see there.            Final Clinical Impression(s) / ED Diagnoses Final diagnoses:  None    Rx / DC Orders ED Discharge Orders     None           Guadalupe Lee, MD 02/25/24 1530

## 2024-02-25 NOTE — ED Notes (Signed)
 Patient transported to CT

## 2024-02-25 NOTE — H&P (Signed)
 [facility] H&P   Patient Demographics:    Nancy Sutton, is a 78 y.o. female  MRN: [id number]   DOB - 1946-01-06  Admit Date - 02/25/2024  Outpatient Primary MD for the patient is [name], MD  Referring MD/NP/PA: Dr. Loralee Roche  Outpatient Specialists: General surgery Dr. Abraham Abo, ENT Dr. Bobetta Burrows, cardiology Dr. Paulita Boss  Patient coming from: Home  Chief Complaint  Patient presents with   having to clear throat   Neck Pain   thyroid  mass      HPI:    Nancy Sutton  is a 78 y.o. female,  with history of stroke with residual left hemianopsia, hypertension and type 2 diabetes, history of CAD with NSTEMI March 2024 status post stent placement, patient has been recently followed by Dr. Abraham Abo on 6/3 for surgery workup, has been cleared by cardiology 6/4, patient presents to ED as she has been having difficulty clearing her throat, and worsening neck pain, reports this symptoms been ongoing for last couple weeks, but has worsened over the last couple days, as she is feeling phlegm stuck in her throat, where she has been trying to clear it unable to do so, denies dyspnea, fever, chills, chest pain, or choking sensations. - In ED labs were significant for glucose at 184, white blood cell count elevated at 13.8, hemoglobin stable at 11.5, her CT neck significant for thyroid  mass, with mass effect, and right vocal cord paralysis, and retropharyngeal spread abuts cervical esophagus, ED discussed with Dr. Alethea Andes from general surgery who requested admission to [facility], so [facility] were requested to admit.    Review of systems:      A full 10 point Review of Systems was done, except as stated above, all other Review of Systems were negative.   With Past History of the following :    Past Medical History:  Diagnosis Date   Arthritis    CAD (coronary artery disease)  03/28/2023   NSTEMI 11/2022 s/p 3 x 16 mm DES to the [facility], 3 x 20 mm DES to the mRCA    - LHC 12/13/2022: RCA ostial 95, mid 90; LCx mid 70; LAD mid 40, distal 70>> Med Rx for LAD, LCx   - TTE 12/09/2022: EF 50-55, no RWMA, mild concentric LVH, GR 2 DD, normal RVSF, mild MR, RAP 15   Diabetes mellitus    since 2008   Essential hypertension, benign 09/10/2015   GERD (gastroesophageal reflux disease)    Hypercholesteremia    Hyperlipidemia    Stroke (HCC) 03/21/2011   only deficits-affected eyesight      Past Surgical History:  Procedure Laterality Date   BIOPSY  07/02/2022   Procedure: BIOPSY;  Surgeon: Urban Garden, MD;  Location: [facility];  Service: Gastroenterology;;   CATARACT EXTRACTION W/PHACO  11/01/2011   Procedure: CATARACT EXTRACTION PHACO AND INTRAOCULAR LENS PLACEMENT (IOC);  Surgeon: Leory Rands  Ethelle Herb, MD;  Location: [facility] ORS;  Service: Ophthalmology;  Laterality: Right;  CDE 12.48   CORONARY STENT INTERVENTION N/A 12/10/2022   Procedure: CORONARY STENT INTERVENTION;  Surgeon: Arnoldo Lapping, MD;  Location: [facility] INVASIVE CV LAB;  Service: Cardiovascular;  Laterality: N/A;   ESOPHAGEAL DILATION N/A 04/29/2017   Procedure: ESOPHAGEAL DILATION;  Surgeon: Ruby Corporal, MD;  Location: [facility];  Service: Endoscopy;  Laterality: N/A;   ESOPHAGOGASTRODUODENOSCOPY N/A 04/29/2017   Procedure: ESOPHAGOGASTRODUODENOSCOPY (EGD);  Surgeon: Ruby Corporal, MD;  Location: [facility];  Service: Endoscopy;  Laterality: N/A;  1015   ESOPHAGOGASTRODUODENOSCOPY (EGD) WITH ESOPHAGEAL DILATION N/A 02/14/2013   Procedure: ESOPHAGOGASTRODUODENOSCOPY (EGD) WITH ESOPHAGEAL DILATION;  Surgeon: Ruby Corporal, MD;  Location: [facility];  Service: Endoscopy;  Laterality: N/A;  200-moved to 255 Ann to notify pt   ESOPHAGOGASTRODUODENOSCOPY (EGD) WITH PROPOFOL  N/A 05/28/2022   Procedure: ESOPHAGOGASTRODUODENOSCOPY (EGD) WITH PROPOFOL ;  Surgeon: Urban Garden, MD;  Location: [facility]  END[facility];  Service: Gastroenterology;  Laterality: N/A;  1030 ASA 2   ESOPHAGOGASTRODUODENOSCOPY (EGD) WITH PROPOFOL  N/A 07/02/2022   Procedure: ESOPHAGOGASTRODUODENOSCOPY (EGD) WITH PROPOFOL ;  Surgeon: Urban Garden, MD;  Location: [facility];  Service: Gastroenterology;  Laterality: N/A;  105 ASA 2, pt knows new time per Hugo Maes   EYE SURGERY  10-2010   left eye removal of cataract   LEFT HEART CATH AND CORONARY ANGIOGRAPHY N/A 12/10/2022   Procedure: LEFT HEART CATH AND CORONARY ANGIOGRAPHY;  Surgeon: Arnoldo Lapping, MD;  Location: [facility] INVASIVE CV LAB;  Service: Cardiovascular;  Laterality: N/A;   TUBAL LIGATION  1984      Social History:     Social History   Tobacco Use   Smoking status: Never   Smokeless tobacco: Never  Substance Use Topics   Alcohol use: No    Alcohol/week: 0.0 standard drinks of alcohol        Family History :     Family History  Problem Relation Age of Onset   Cancer Mother    Diabetes Mother    Cancer Father    Diabetes Father    Anesthesia problems Neg Hx    Hypotension Neg Hx    Malignant hyperthermia Neg Hx    Pseudochol deficiency Neg Hx       Home Medications:   Prior to Admission medications   Medication Sig Start Date End Date Taking? Authorizing Provider  acetaminophen  (TYLENOL ) 650 MG CR tablet Take 500-1,000 mg by mouth every 8 (eight) hours as needed for pain.   Yes [provider]  calcium  carbonate (TUMS - DOSED IN MG ELEMENTAL CALCIUM ) 500 MG chewable tablet Chew 1 tablet by mouth at bedtime as needed for indigestion or heartburn.   Yes [provider]  cholecalciferol (VITAMIN D3) 25 MCG (1000 UNIT) tablet Take 10 mcg by mouth daily.   Yes [provider]  clopidogrel  (PLAVIX ) 75 MG tablet Take 75 mg by mouth at bedtime. 12/26/23  Yes [provider]  diltiazem  (CARDIZEM ) 30 MG tablet Take 1 tablet (30 mg total) by mouth 2 (two) times daily. Start on 04/10/23 12/29/23 12/28/24  Yes Chandrasekhar, Mahesh A, MD  Dulaglutide  (TRULICITY ) 1.5 MG/0.5ML SOAJ Inject 1.5 mg into the skin once a week. Patient taking differently: Inject 1.5 mg into the skin every Monday. 07/26/23  Yes Emilie Harden, MD  ezetimibe (ZETIA) 10 MG tablet Take 10 mg by mouth at bedtime.   Yes [provider]  insulin  aspart (NOVOLOG   FLEXPEN) 100 UNIT/ML FlexPen Inject 10-12 Units into the skin 3 (three) times daily with meals. Patient taking differently: Inject 12-15 Units into the skin 3 (three) times daily with meals. 05/27/22  Yes Emilie Harden, MD  insulin  degludec (TRESIBA FLEXTOUCH) 200 UNIT/ML FlexTouch Pen Inject 30 Units into the skin at bedtime.   Yes [provider]  latanoprost  (XALATAN ) 0.005 % ophthalmic solution Place 1 drop into both eyes at bedtime. 03/22/17  Yes [provider]  metFORMIN  (GLUCOPHAGE -XR) 500 MG 24 hr tablet TAKE 1 TABLET BY MOUTH EVERY DAY WITH BREAKFAST Patient taking differently: Take 500 mg by mouth daily with breakfast. 11/04/23  Yes Emilie Harden, MD  Multiple Vitamin (ONE-A-DAY 55 PLUS PO) Take 1 tablet by mouth daily.    Yes [provider]  naproxen sodium (ALEVE) 220 MG tablet Take 220 mg by mouth 2 (two) times daily as needed (for pain alternating with Tylenol ).   Yes [provider]  omeprazole  (PRILOSEC) 40 MG capsule Take 1 capsule (40 mg total) by mouth daily. 05/02/23  Yes Urban Garden, MD  Probiotic Product (PROBIOTIC DAILY PO) Take 1 tablet by mouth daily.   Yes [provider]  rosuvastatin  (CRESTOR ) 40 MG tablet Take 1 tablet (40 mg total) by mouth daily. 05/10/23 02/25/24 Yes Weaver, Scott T, PA-C  timolol  (BETIMOL ) 0.5 % ophthalmic solution Place 1 drop into both eyes 2 (two) times daily.   Yes [provider]  vitamin C (ASCORBIC ACID) 500 MG tablet Take 500 mg by mouth daily.   Yes [provider]  vitamin E 180 MG (400 UNITS) capsule Take 400 Units by mouth at  bedtime.   Yes [provider]     Allergies:     Allergies  Allergen Reactions   Ampicillin Hives    Breaks out in bumps Has patient had a PCN reaction causing immediate rash, facial/tongue/throat swelling, SOB or lightheadedness with hypotension: No Has patient had a PCN reaction causing severe rash involving mucus membranes or skin necrosis: Yes Has patient had a PCN reaction that required hospitalization: No Has patient had a PCN reaction occurring within the last 10 years: No If all of the above answers are "NO", then may proceed with Cephalosporin use.    Bromday [Bromfenac Sodium] Itching and Swelling    Eye Drops   Lisinopril Cough   Tetanus Toxoids Hives    Breaks out in bumps     Physical Exam:   Vitals  Blood pressure (!) 146/93, pulse 86, temperature 98.1 F (36.7 C), temperature source Oral, resp. rate 16, height 5' (1.524 m), weight 54.4 kg, SpO2 97%.   1. General Well-developed female, laying in bed, no apparent distress  2. Normal affect and insight, Not Suicidal or Homicidal, Awake Alert, Oriented X 3.  3. No F.N deficits, ALL C.Nerves Intact, Strength 5/5 all 4 extremities, Sensation intact all 4 extremities, Plantars down going.  4. Ears and Eyes appear Normal, Conjunctivae clear, PERRLA. Moist Oral Mucosa.  Hoarse voice  5.  Right anterior neck fullness  6. Symmetrical Chest wall movement, Good air movement bilaterally, CTAB.  7. RRR, No Gallops, Rubs or Murmurs, No Parasternal Heave.  8. Positive Bowel Sounds, Abdomen Soft, No tenderness, No organomegaly appriciated,No rebound -guarding or rigidity.  9.  No Cyanosis, Normal Skin Turgor, No Skin Rash or Bruise.  10. Good muscle tone,  joints appear normal , no effusions, Normal ROM.     CBC Recent Labs  Lab 02/25/24 1217  WBC  13.8*  HGB 11.5*  HCT 34.7*  PLT 349  MCV 87.4  MCH 29.0  MCHC 33.1  RDW 13.8    ------------------------------------------------------------------------------------------------------------------  Chemistries  Recent Labs  Lab 02/25/24 1217  NA 138  K 4.2  CL 102  CO2 26  GLUCOSE 184*  BUN 15  CREATININE 0.73  CALCIUM  10.2   ------------------------------------------------------------------------------------------------------------------ estimated creatinine clearance is 41.6 mL/min (by C-G formula based on SCr of 0.73 mg/dL). ------------------------------------------------------------------------------------------------------------------ No results for input(s): "TSH", "T4TOTAL", "T3FREE", "THYROIDAB" in the last 72 hours.  Invalid input(s): "FREET3"  Coagulation profile No results for input(s): "INR", "PROTIME" in the last 168 hours. ------------------------------------------------------------------------------------------------------------------- No results for input(s): "DDIMER" in the last 72 hours. -------------------------------------------------------------------------------------------------------------------  Cardiac Enzymes No results for input(s): "CKMB", "TROPONINI", "MYOGLOBIN" in the last 168 hours.  Invalid input(s): "CK" ------------------------------------------------------------------------------------------------------------------ No results found for: "BNP"   ---------------------------------------------------------------------------------------------------------------  Urinalysis    Component Value Date/Time   COLORURINE YELLOW 04/30/2011 2115   APPEARANCEUR CLOUDY (A) 04/30/2011 2115   LABSPEC 1.031 (H) 04/30/2011 2115   PHURINE 5.0 04/30/2011 2115   GLUCOSEU NEGATIVE 04/30/2011 2115   HGBUR NEGATIVE 04/30/2011 2115   BILIRUBINUR NEGATIVE 04/30/2011 2115   KETONESUR 15 (A) 04/30/2011 2115   PROTEINUR NEGATIVE 04/30/2011 2115   UROBILINOGEN 0.2 04/30/2011 2115   NITRITE NEGATIVE 04/30/2011 2115   LEUKOCYTESUR TRACE  (A) 04/30/2011 2115    ----------------------------------------------------------------------------------------------------------------   Imaging Results:    CT Soft Tissue Neck W Contrast Result Date: 02/25/2024 CLINICAL DATA:  Thyroid  nodule, recent diagnosis of thyroid  cancer. Neck pain, throat clearing, concern for new mass or swelling. EXAM: CT NECK WITH CONTRAST TECHNIQUE: Multidetector CT imaging of the neck was performed using the standard protocol following the bolus administration of intravenous contrast. RADIATION DOSE REDUCTION: This exam was performed according to the departmental dose-optimization program which includes automated exposure control, adjustment of the mA and/or kV according to patient size and/or use of iterative reconstruction technique. CONTRAST:  75mL OMNIPAQUE  IOHEXOL  300 MG/ML  SOLN COMPARISON:  None Available. FINDINGS: There is a 4.1 x 2.5 x 3.9 cm heterogeneous centrally hypoattenuating mass centered in the right lower neck at the level of the thyroid  which demonstrates mild areas of peripherally enhancing soft tissue. There is a prominent calcification within the central/posterior aspect of the mass measuring up to 1.2 cm in diameter. There is associated mass effect with leftward deviation of the airway from the supraglottic airway to the extra thoracic trachea. The medial aspect of the mass abuts and possibly involves the right aspect of the thyroid  cartilage. There is retropharyngeal extension of the mass at the level of the cricoid cartilage. The mass abuts the right lateral aspect of the hypopharynx and cervical esophagus. There is retropharyngeal edema noted extending from C2 to the level of C5 measuring up to 6 mm in thickness. There is additional edema abutting the right carotid space without evidence of significant vascular narrowing. Additional masslike peripherally enhancing soft tissue involving the right sternocleidomastoid muscle observed on series 2 images  52-60. No retrosternal extension of the mass. Pharynx and larynx: Large thyroid  mass as above with mass effect on the airway. The nasopharynx is symmetric. Symmetric appearance of the palatine tonsils. The oral cavity, floor of mouth, and base of tongue are unremarkable. Normal appearance of the epiglottis. Asymmetry of the piriform sinuses noted. The paraglottic fat appears unremarkable. There is asymmetry of the right laryngeal ventricle with slight medial positioning of the right vocal folds. Salivary glands: No inflammation, mass, or stone. Thyroid :  Right thyroid  mass as above. Subcentimeter nodule in the inferior left thyroid  lobe. Lymph nodes: Right level 2A cervical node measuring 0.8 cm in short axis with possible central hypoattenuation concerning for central necrosis (series 2, image 42). Additional right level 2 B/3 cervical nodes measuring up to 1.0 cm in short axis. Vascular: Moderate atherosclerosis of the visualized aortic arch. Thyroid  mass abuts the right common carotid artery with lateral displacement of the vessel. There is edema surrounding the right common carotid artery with questionable wall thickening without evidence of vascular narrowing. Atherosclerosis at the carotid bifurcation likely with at least moderate stenosis. Limited intracranial: Limited visualization of intracranial structures without acute abnormality. Visualized orbits: Bilateral lens replacement. Orbits otherwise unremarkable. Mastoids and visualized paranasal sinuses: Mild mucosal thickening in the ethmoid sinuses. Mastoid air cells are clear. Skeleton: No acute or aggressive process. Upper chest: Negative. Other: None. IMPRESSION: 4.1 x 2.5 x 3.9 cm heterogeneous mass in the right anterior neck at the level of the thyroid  concerning for residual/recurrent thyroid  malignancy. Associated mass effect and leftward shift of the airway without significant airway narrowing. Irregularity of the right aspect of the thyroid   cartilage adjacent to the mass concerning for possible invasion. 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 Recommend correlation with direct visualization. Retropharyngeal extension of the mass which abuts the lower hypopharynx/cervical esophagus. Associated retropharyngeal edema. Edema noted along the right carotid space without significant vascular narrowing. Enlarged right level 2 and level 3 cervical nodes several of which demonstrate hypoattenuation concerning for metastatic lymphadenopathy with central necrosis. Lateral displacement of the right common carotid artery. Edema along the vessel with possible wall thickening which may reflect reactive inflammatory changes. No significant irregularity or narrowing to suggest vascular invasion. Electronically Signed   By: Denny Flack M.D.   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 Alvena Aurora with plan for surgical intervention, was awaiting cardiology clearance (she has already cleared with recommendation she can hold Plavix  5 days before surgery). - Given she is symptomatic, unable to clear her airways, and evidence with mass effect (but no airway compromise) she will be admitted to [facility] for evaluation by general surgery as looksee will need surgical intervention sooner than later. - ED physician discussed with Dr. Alethea Andes he will will evaluate when she gets to [facility]. - Given difficulty clearing her lesions I will keep her n.p.o., especially with p mass retropharyngeal spread  abuts cervical esophagus  CAD with PCI March 2024 - He has been cleared by cardiology for surgery. - Will hold Plavix  in anticipation for surgery - Continue with statin. - With history of single episode of A-fib during last NSTEMI event of last year 2024, no recurrence since, not on any anticoagulation  GERD/Barrett's -Continue with PPI   Diabetes mellitus, insulin -dependent - She is on 30 units of Levemir  at home, will decrease to 10 units at bedtime as she is n.p.o., and will add insulin  sliding scale, will hold home regimen including metformin  and CT  Hyperlipidemia -continue with statin and Zetia  History of stroke -plavix  on  hold due to above -Continue with statin and Zetia  Hypertension - Continue with home dose Cardizem    Hypokalemia -Replace   DVT Prophylaxis Heparin    AM Labs Ordered, also please review Full Orders  Family Communication: Admission, patients condition and plan of care including tests being ordered have been discussed with the patient and husband at bedside who indicate understanding and agree with the plan and Code Status.  Code Status full code  Likely DC to home  Consults called: General Surgery Dr. Alethea Andes by ED  Admission status: Inpatient  Time spent in minutes : 70 minutes   Seena Dadds M.D on 02/25/2024 at 3:57 PM   [facility] - Office  [phone]

## 2024-02-25 NOTE — ED Triage Notes (Addendum)
 Pt with SOB since last night.  No distress noted in triage. Recent dx'd with thyroid  CA. Pt states she feels like phlegm stuck in her throat, tries to clear her throat constantly.  Sent here to get evaluated for lump for possible obstructing her airway.

## 2024-02-26 ENCOUNTER — Inpatient Hospital Stay (HOSPITAL_COMMUNITY)

## 2024-02-26 DIAGNOSIS — R221 Localized swelling, mass and lump, neck: Secondary | ICD-10-CM | POA: Diagnosis not present

## 2024-02-26 DIAGNOSIS — C73 Malignant neoplasm of thyroid gland: Secondary | ICD-10-CM | POA: Diagnosis not present

## 2024-02-26 LAB — GLUCOSE, CAPILLARY
Glucose-Capillary: 145 mg/dL — ABNORMAL HIGH (ref 70–99)
Glucose-Capillary: 149 mg/dL — ABNORMAL HIGH (ref 70–99)
Glucose-Capillary: 130 mg/dL — ABNORMAL HIGH (ref 70–99)
Glucose-Capillary: 197 mg/dL — ABNORMAL HIGH (ref 70–99)
Glucose-Capillary: 208 mg/dL — ABNORMAL HIGH (ref 70–99)

## 2024-02-26 LAB — CBC WITH DIFFERENTIAL/PLATELET
Abs Immature Granulocytes: 0.09 10*3/uL — ABNORMAL HIGH (ref 0.00–0.07)
Basophils Absolute: 0 10*3/uL (ref 0.0–0.1)
Basophils Relative: 0 %
Eosinophils Absolute: 0.2 10*3/uL (ref 0.0–0.5)
Eosinophils Relative: 1 %
HCT: 29.8 % — ABNORMAL LOW (ref 36.0–46.0)
Hemoglobin: 9.8 g/dL — ABNORMAL LOW (ref 12.0–15.0)
Immature Granulocytes: 1 %
Lymphocytes Relative: 11 %
Lymphs Abs: 1.5 10*3/uL (ref 0.7–4.0)
MCH: 28.5 pg (ref 26.0–34.0)
MCHC: 32.9 g/dL (ref 30.0–36.0)
MCV: 86.6 fL (ref 80.0–100.0)
Monocytes Absolute: 1.6 10*3/uL — ABNORMAL HIGH (ref 0.1–1.0)
Monocytes Relative: 12 %
Neutro Abs: 9.6 10*3/uL — ABNORMAL HIGH (ref 1.7–7.7)
Neutrophils Relative %: 75 %
Platelets: 310 10*3/uL (ref 150–400)
RBC: 3.44 MIL/uL — ABNORMAL LOW (ref 3.87–5.11)
RDW: 14.4 % (ref 11.5–15.5)
WBC: 12.9 10*3/uL — ABNORMAL HIGH (ref 4.0–10.5)
nRBC: 0 % (ref 0.0–0.2)

## 2024-02-26 LAB — PROTIME-INR
INR: 1.1 (ref 0.8–1.2)
Prothrombin Time: 14.7 s (ref 11.4–15.2)

## 2024-02-26 LAB — BASIC METABOLIC PANEL WITH GFR
Anion gap: 7 (ref 5–15)
BUN: 13 mg/dL (ref 8–23)
CO2: 25 mmol/L (ref 22–32)
Calcium: 8.7 mg/dL — ABNORMAL LOW (ref 8.9–10.3)
Chloride: 103 mmol/L (ref 98–111)
Creatinine, Ser: 0.79 mg/dL (ref 0.44–1.00)
GFR, Estimated: >60 mL/min (ref >60)
Glucose, Bld: 127 mg/dL — ABNORMAL HIGH (ref 70–99)
Potassium: 4 mmol/L (ref 3.5–5.1)
Sodium: 135 mmol/L (ref 135–145)

## 2024-02-26 LAB — MAGNESIUM: Magnesium: 1.6 mg/dL — ABNORMAL LOW (ref 1.7–2.4)

## 2024-02-26 LAB — BRAIN NATRIURETIC PEPTIDE: B Natriuretic Peptide: 56.7 pg/mL (ref 0.0–100.0)

## 2024-02-26 MED ORDER — LACTATED RINGERS IV SOLN: INTRAVENOUS | Status: AC

## 2024-02-26 MED ORDER — LACTATED RINGERS IV SOLN: INTRAVENOUS | Status: DC

## 2024-02-26 NOTE — Progress Notes (Addendum)
 Subjective/Chief Complaint: Patient evaluated by Dr. Sofia Dunn in the office on 02/21/24 - to be scheduled for total thyroidectomy with central compartment lymph node dissection.  Cardiac clearance note received 02/22/24.  Patient has a feeling that she needs to clear her throat, but is unable to cough up any phlegm.  However, she is not having any issues with swallowing.  She is not choking or aspirating when she swallows pills or food.  No apparent impending airway compromise.  Her last dose of Plavix  was 02/24/24 PM.   Objective: Vital signs in last 24 hours: Temp:  [98.1 F (36.7 C)-99.1 F (37.3 C)] 98.8 F (37.1 C) (06/08 0754) Pulse Rate:  [83-96] 93 (06/08 0754) Resp:  [16-20] 18 (06/08 0754) BP: (127-164)/(54-108) 135/62 (06/08 0754) SpO2:  [95 %-100 %] 96 % (06/08 0754) Weight:  [54.4 kg-55.1 kg] 55.1 kg (06/07 1954)    Intake/Output from previous day: 06/07 0701 - 06/08 0700 In: 0  Out: 400 [Urine:400] Intake/Output this shift: No intake/output data recorded.  Elderly female in NAD Trachea midline Right thyroid  mass. No stridor Voice - hoarse  Lab Results:  Recent Labs    02/25/24 1217 02/26/24 0548  WBC 13.8* 12.9*  HGB 11.5* 9.8*  HCT 34.7* 29.8*  PLT 349 310   BMET Recent Labs    02/25/24 1217 02/26/24 0548  NA 138 135  K 4.2 4.0  CL 102 103  CO2 26 25  GLUCOSE 184* 127*  BUN 15 13  CREATININE 0.73 0.79  CALCIUM  10.2 8.7*   PT/INR Recent Labs    02/26/24 0548  LABPROT 14.7  INR 1.1   Studies/Results: DG Chest Port 1 View Result Date: 02/26/2024 CLINICAL DATA:  141880 SOB (shortness of breath) 141880 EXAM: PORTABLE CHEST - 1 VIEW COMPARISON:  07/04/2007. FINDINGS: Cardiac silhouette is unremarkable. No pneumothorax or pleural effusion. The lungs are clear. Aorta is calcified. The visualized skeletal structures are unremarkable. IMPRESSION: No acute cardiopulmonary process. Electronically Signed   By: Sydell Eva M.D.   On: 02/26/2024  06:37   CT Soft Tissue Neck W Contrast Result Date: 02/25/2024 CLINICAL DATA:  Thyroid  nodule, recent diagnosis of thyroid  cancer. Neck pain, throat clearing, concern for new mass or swelling. EXAM: CT NECK WITH CONTRAST TECHNIQUE: Multidetector CT imaging of the neck was performed using the standard protocol following the bolus administration of intravenous contrast. RADIATION DOSE REDUCTION: This exam was performed according to the departmental dose-optimization program which includes automated exposure control, adjustment of the mA and/or kV according to patient size and/or use of iterative reconstruction technique. CONTRAST:  75mL OMNIPAQUE  IOHEXOL  300 MG/ML  SOLN COMPARISON:  None Available. FINDINGS: There is a 4.1 x 2.5 x 3.9 cm heterogeneous centrally hypoattenuating mass centered in the right lower neck at the level of the thyroid  which demonstrates mild areas of peripherally enhancing soft tissue. There is a prominent calcification within the central/posterior aspect of the mass measuring up to 1.2 cm in diameter. There is associated mass effect with leftward deviation of the airway from the supraglottic airway to the extra thoracic trachea. The medial aspect of the mass abuts and possibly involves the right aspect of the thyroid  cartilage. There is retropharyngeal extension of the mass at the level of the cricoid cartilage. The mass abuts the right lateral aspect of the hypopharynx and cervical esophagus. There is retropharyngeal edema noted extending from C2 to the level of C5 measuring up to 6 mm in thickness. There is additional edema abutting the right carotid  space without evidence of significant vascular narrowing. Additional masslike peripherally enhancing soft tissue involving the right sternocleidomastoid muscle observed on series 2 images 52-60. No retrosternal extension of the mass. Pharynx and larynx: Large thyroid  mass as above with mass effect on the airway. The nasopharynx is symmetric.  Symmetric appearance of the palatine tonsils. The oral cavity, floor of mouth, and base of tongue are unremarkable. Normal appearance of the epiglottis. Asymmetry of the piriform sinuses noted. The paraglottic fat appears unremarkable. There is asymmetry of the right laryngeal ventricle with slight medial positioning of the right vocal folds. Salivary glands: No inflammation, mass, or stone. Thyroid : Right thyroid  mass as above. Subcentimeter nodule in the inferior left thyroid  lobe. Lymph nodes: Right level 2A cervical node measuring 0.8 cm in short axis with possible central hypoattenuation concerning for central necrosis (series 2, image 42). Additional right level 2 B/3 cervical nodes measuring up to 1.0 cm in short axis. Vascular: Moderate atherosclerosis of the visualized aortic arch. Thyroid  mass abuts the right common carotid artery with lateral displacement of the vessel. There is edema surrounding the right common carotid artery with questionable wall thickening without evidence of vascular narrowing. Atherosclerosis at the carotid bifurcation likely with at least moderate stenosis. Limited intracranial: Limited visualization of intracranial structures without acute abnormality. Visualized orbits: Bilateral lens replacement. Orbits otherwise unremarkable. Mastoids and visualized paranasal sinuses: Mild mucosal thickening in the ethmoid sinuses. Mastoid air cells are clear. Skeleton: No acute or aggressive process. Upper chest: Negative. Other: None. IMPRESSION: 4.1 x 2.5 x 3.9 cm heterogeneous mass in the right anterior neck at the level of the thyroid  concerning for residual/recurrent thyroid  malignancy. Associated mass effect and leftward shift of the airway without significant airway narrowing. Irregularity of the right aspect of the thyroid  cartilage adjacent to the mass concerning for possible invasion. 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 Recommend correlation with direct  visualization. Retropharyngeal extension of the mass which abuts the lower hypopharynx/cervical esophagus. Associated retropharyngeal edema. Edema noted along the right carotid space without significant vascular narrowing. Enlarged right level 2 and level 3 cervical nodes several of which demonstrate hypoattenuation concerning for metastatic lymphadenopathy with central necrosis. Lateral displacement of the right common carotid artery. Edema along the vessel with possible wall thickening which may reflect reactive inflammatory changes. No significant irregularity or narrowing to suggest vascular invasion. Electronically Signed   By: Denny Flack M.D.   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  Patient has been cleared by cardiology - two days since last Plavix   Will allow patient to have clear liquids today.  DO NOT ADVANCE DIET. Will discuss timing of surgery with Dr. Sofia Dunn No indications for emergent surgery or airway intervention today. Hold Plavix  and any long-acting anticoagulation.  LOS: 1 day    Rella Cardinal 02/26/2024

## 2024-02-26 NOTE — Plan of Care (Signed)
  Problem: Metabolic: Goal: Ability to maintain appropriate glucose levels will improve Outcome: Progressing   Problem: Nutritional: Goal: Maintenance of adequate nutrition will improve Outcome: Progressing   Problem: Clinical Measurements: Goal: Will remain free from infection Outcome: Progressing Goal: Diagnostic test results will improve Outcome: Progressing Goal: Respiratory complications will improve Outcome: Progressing   Problem: Nutrition: Goal: Adequate nutrition will be maintained Outcome: Progressing

## 2024-02-26 NOTE — Plan of Care (Signed)
 Pt has rested quietly throughout the night with no distress noted. Alert and oriented. On room air. SR on the monitor. Up to BR to void. No c/o pain. Complaints of having feeling of thick like secretions in back of throat. No other complaints voiced.     Problem: Health Behavior/Discharge Planning: Goal: Ability to manage health-related needs will improve Outcome: Progressing   Problem: Metabolic: Goal: Ability to maintain appropriate glucose levels will improve Outcome: Progressing   Problem: Tissue Perfusion: Goal: Adequacy of tissue perfusion will improve Outcome: Progressing   Problem: Education: Goal: Knowledge of General Education information will improve Description: Including pain rating scale, medication(s)/side effects and non-pharmacologic comfort measures Outcome: Progressing   Problem: Clinical Measurements: Goal: Respiratory complications will improve Outcome: Progressing   Problem: Pain Managment: Goal: General experience of comfort will improve and/or be controlled Outcome: Progressing

## 2024-02-26 NOTE — Progress Notes (Signed)
 PROGRESS NOTE                                                                                                                                                                                                             Patient Demographics:    Nancy Sutton, is a 78 y.o. female, DOB - [birth year], OHY:[id number]  Outpatient Primary MD for the patient is [name], MD    LOS - 1  Admit date - 02/25/2024    Chief Complaint  Patient presents with   having to clear throat   Neck Pain   thyroid  mass       Brief Narrative (HPI from H&P)   78 y.o. female,  with history of stroke with residual left hemianopsia, hypertension and type 2 diabetes, history of CAD with NSTEMI March 2024 status post stent placement, patient has been recently followed by Dr. Abraham Sutton on 6/3 for surgery workup for a thyroid  mass, has been cleared by cardiology 6/4 to undergo surgery, patient presents to ED as she has been having difficulty clearing her throat, and worsening neck pain, reports this symptoms been ongoing for last couple weeks, but has worsened over the last couple days, she had no problems breathing, no shortness of breath.  Initially presented to [facility] but was subsequently sent to [facility] for further surgical care of her thyroid  mass.   Subjective:    Nancy Sutton today has, No headache, No chest pain, No abdominal pain - No Nausea, No new weakness tingling or numbness, no SOB, does have problems swallowing food and liquids.  Able to control her oral saliva.   Assessment  & Plan :   Right anterior neck mass concerning for residual or reoccurring thyroid  malignancy.  Causing dysphagia, currently maintaining her airway and no shortness of breath, has been seen by Dr. Sofia Dunn, was cleared by cardiology to undergo surgical procedure on 02/22/2024.  Patient will be kept n.p.o., currently maintaining her airway without any issues, no  stridor, general surgery has been consulted, check INR, type screen, continue to hold Plavix  last Plavix  was 02/24/2024.  Patient agreeable for blood transfusion if needed.  History of stroke with left-sided hemianopsia.  Continue with supportive care.  Currently Plavix  on hold.  Continue statin.  CAD s/p PCI in March 2024.  Has seen her cardiologist and was cleared  to undergo surgical procedure on 02/22/2024, Plavix  on hold continue statin.    History of 1 episode of A-fib in 2024.  This was in conjunction of her NSTEMI.  Not on anticoagulation, follows with her cardiologist, continue low-dose Cardizem .  Dyslipidemia.  Continue combination of statin and Zetia.  Essential hypertension.  On Cardizem .  DM type II on Lantus and sliding scale monitor and adjust as she is NPO.  Lantus dose has been reduced.  Lab Results  Component Value Date   HGBA1C 6.9 (A) 01/03/2024    CBG (last 3)  Recent Labs    02/25/24 2303 02/26/24 0435 02/26/24 0756  GLUCAP 158* 145* 149*         Condition - Fair  Family Communication  : Husband bedside on 02/26/2024  Code Status :  Full  Consults  : CCS  PUD Prophylaxis : PPI   Procedures  :      CT - 4.1 x 2.5 x 3.9 cm heterogeneous mass in the right anterior neck at the level of the thyroid  concerning for residual/recurrent thyroid  malignancy. Associated mass effect and leftward shift of the airway without significant airway narrowing. Irregularity of the right aspect of the thyroid  cartilage adjacent to the mass concerning for possible invasion. 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 Recommend correlation with direct visualization. Retropharyngeal extension of the mass which abuts the lower hypopharynx/cervical esophagus. Associated retropharyngeal edema. Edema noted along the right carotid space without significant vascular narrowing. Enlarged right level 2 and level 3 cervical nodes several of which demonstrate hypoattenuation concerning  for metastatic lymphadenopathy with central necrosis. Lateral displacement of the right common carotid artery. Edema along the vessel with possible wall thickening which may reflect reactive inflammatory changes. No significant irregularity or narrowing to suggest vascular invasion.       Disposition Plan  :    Status is: Inpatient   DVT Prophylaxis  :    heparin  injection 5,000 Units Start: 02/26/24 1400   Lab Results  Component Value Date   PLT 310 02/26/2024    Diet :  Diet Order             Diet NPO time specified Except for: Sips with Meds, Ice Chips  Diet effective now                    Inpatient Medications  Scheduled Meds:  diltiazem   30 mg Oral BID   heparin   5,000 Units Subcutaneous Q8H   insulin  aspart  0-6 Units Subcutaneous Q4H   insulin  glargine-yfgn  8 Units Subcutaneous QHS   latanoprost   1 drop Both Eyes QHS   pantoprazole   40 mg Oral Daily   rosuvastatin   40 mg Oral Daily   timolol   1 drop Both Eyes BID   Continuous Infusions:  lactated ringers  75 mL/hr at 02/26/24 0756   PRN Meds:.acetaminophen  **OR** acetaminophen , hydrALAZINE , HYDROcodone-acetaminophen , morphine injection, ondansetron  **OR** ondansetron  (ZOFRAN ) IV  Antibiotics  :    Anti-infectives (From admission, onward)    None         Objective:   Vitals:   02/25/24 1700 02/25/24 1900 02/25/24 1954 02/26/24 0754  BP: (!) 148/57 (!) 139/58 (!) 158/60 135/62  Pulse: 86 90 96 93  Resp: 16  20 18   Temp:   99.1 F (37.3 C) 98.8 F (37.1 C)  TempSrc:   Oral Oral  SpO2: 95% 98%  96%  Weight:   55.1 kg   Height:   5' (1.524 m)  Wt Readings from Last 3 Encounters:  02/25/24 55.1 kg  01/03/24 56.3 kg  12/29/23 56.7 kg     Intake/Output Summary (Last 24 hours) at 02/26/2024 0817 Last data filed at 02/26/2024 0306 Gross per 24 hour  Intake 0 ml  Output 400 ml  Net -400 ml     Physical Exam  Awake Alert, No new F.N deficits, Normal affect [place].AT,PERRAL Supple  Neck, No JVD, right-sided neck mass noted Symmetrical Chest wall movement, Good air movement bilaterally, CTAB RRR,No Gallops,Rubs or new Murmurs,  +ve B.Sounds, Abd Soft, No tenderness,   No Cyanosis, Clubbing or edema      Data Review:    Recent Labs  Lab 02/25/24 1217 02/26/24 0548  WBC 13.8* 12.9*  HGB 11.5* 9.8*  HCT 34.7* 29.8*  PLT 349 310  MCV 87.4 86.6  MCH 29.0 28.5  MCHC 33.1 32.9  RDW 13.8 14.4  LYMPHSABS  --  1.5  MONOABS  --  1.6*  EOSABS  --  0.2  BASOSABS  --  0.0    Recent Labs  Lab 02/25/24 1217 02/26/24 0548  NA 138 135  K 4.2 4.0  CL 102 103  CO2 26 25  ANIONGAP 10 7  GLUCOSE 184* 127*  BUN 15 13  CREATININE 0.73 0.79  INR  --  1.1  BNP  --  56.7  MG  --  1.6*  CALCIUM  10.2 8.7*      Recent Labs  Lab 02/25/24 1217 02/26/24 0548  INR  --  1.1  BNP  --  56.7  MG  --  1.6*  CALCIUM  10.2 8.7*    --------------------------------------------------------------------------------------------------------------- Lab Results  Component Value Date   CHOL 129 08/12/2023   HDL 68 08/12/2023   LDLCALC 43 08/12/2023   TRIG 96 08/12/2023   CHOLHDL 1.9 08/12/2023    Lab Results  Component Value Date   HGBA1C 6.9 (A) 01/03/2024    Radiology Report DG Chest Port 1 View Result Date: 02/26/2024 CLINICAL DATA:  141880 SOB (shortness of breath) 141880 EXAM: PORTABLE CHEST - 1 VIEW COMPARISON:  07/04/2007. FINDINGS: Cardiac silhouette is unremarkable. No pneumothorax or pleural effusion. The lungs are clear. Aorta is calcified. The visualized skeletal structures are unremarkable. IMPRESSION: No acute cardiopulmonary process. Electronically Signed   By: Sydell Eva M.D.   On: 02/26/2024 06:37   CT Soft Tissue Neck W Contrast Result Date: 02/25/2024 CLINICAL DATA:  Thyroid  nodule, recent diagnosis of thyroid  cancer. Neck pain, throat clearing, concern for new mass or swelling. EXAM: CT NECK WITH CONTRAST TECHNIQUE: Multidetector CT imaging of  the neck was performed using the standard protocol following the bolus administration of intravenous contrast. RADIATION DOSE REDUCTION: This exam was performed according to the departmental dose-optimization program which includes automated exposure control, adjustment of the mA and/or kV according to patient size and/or use of iterative reconstruction technique. CONTRAST:  75mL OMNIPAQUE  IOHEXOL  300 MG/ML  SOLN COMPARISON:  None Available. FINDINGS: There is a 4.1 x 2.5 x 3.9 cm heterogeneous centrally hypoattenuating mass centered in the right lower neck at the level of the thyroid  which demonstrates mild areas of peripherally enhancing soft tissue. There is a prominent calcification within the central/posterior aspect of the mass measuring up to 1.2 cm in diameter. There is associated mass effect with leftward deviation of the airway from the supraglottic airway to the extra thoracic trachea. The medial aspect of the mass abuts and possibly involves the right aspect of the thyroid  cartilage. There is retropharyngeal  extension of the mass at the level of the cricoid cartilage. The mass abuts the right lateral aspect of the hypopharynx and cervical esophagus. There is retropharyngeal edema noted extending from C2 to the level of C5 measuring up to 6 mm in thickness. There is additional edema abutting the right carotid space without evidence of significant vascular narrowing. Additional masslike peripherally enhancing soft tissue involving the right sternocleidomastoid muscle observed on series 2 images 52-60. No retrosternal extension of the mass. Pharynx and larynx: Large thyroid  mass as above with mass effect on the airway. The nasopharynx is symmetric. Symmetric appearance of the palatine tonsils. The oral cavity, floor of mouth, and base of tongue are unremarkable. Normal appearance of the epiglottis. Asymmetry of the piriform sinuses noted. The paraglottic fat appears unremarkable. There is asymmetry of the  right laryngeal ventricle with slight medial positioning of the right vocal folds. Salivary glands: No inflammation, mass, or stone. Thyroid : Right thyroid  mass as above. Subcentimeter nodule in the inferior left thyroid  lobe. Lymph nodes: Right level 2A cervical node measuring 0.8 cm in short axis with possible central hypoattenuation concerning for central necrosis (series 2, image 42). Additional right level 2 B/3 cervical nodes measuring up to 1.0 cm in short axis. Vascular: Moderate atherosclerosis of the visualized aortic arch. Thyroid  mass abuts the right common carotid artery with lateral displacement of the vessel. There is edema surrounding the right common carotid artery with questionable wall thickening without evidence of vascular narrowing. Atherosclerosis at the carotid bifurcation likely with at least moderate stenosis. Limited intracranial: Limited visualization of intracranial structures without acute abnormality. Visualized orbits: Bilateral lens replacement. Orbits otherwise unremarkable. Mastoids and visualized paranasal sinuses: Mild mucosal thickening in the ethmoid sinuses. Mastoid air cells are clear. Skeleton: No acute or aggressive process. Upper chest: Negative. Other: None. IMPRESSION: 4.1 x 2.5 x 3.9 cm heterogeneous mass in the right anterior neck at the level of the thyroid  concerning for residual/recurrent thyroid  malignancy. Associated mass effect and leftward shift of the airway without significant airway narrowing. Irregularity of the right aspect of the thyroid  cartilage adjacent to the mass concerning for possible invasion. 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 Recommend correlation with direct visualization. Retropharyngeal extension of the mass which abuts the lower hypopharynx/cervical esophagus. Associated retropharyngeal edema. Edema noted along the right carotid space without significant vascular narrowing. Enlarged right level 2 and level 3 cervical nodes  several of which demonstrate hypoattenuation concerning for metastatic lymphadenopathy with central necrosis. Lateral displacement of the right common carotid artery. Edema along the vessel with possible wall thickening which may reflect reactive inflammatory changes. No significant irregularity or narrowing to suggest vascular invasion. Electronically Signed   By: Denny Flack M.D.   On: 02/25/2024 13:47     Signature  -   Lynnwood Sauer M.D on 02/26/2024 at 8:17 AM   -  To page go to [email or web address]

## 2024-02-27 DIAGNOSIS — C73 Malignant neoplasm of thyroid gland: Secondary | ICD-10-CM | POA: Diagnosis not present

## 2024-02-27 DIAGNOSIS — R221 Localized swelling, mass and lump, neck: Secondary | ICD-10-CM | POA: Diagnosis not present

## 2024-02-27 LAB — GLUCOSE, CAPILLARY
Glucose-Capillary: 111 mg/dL — ABNORMAL HIGH (ref 70–99)
Glucose-Capillary: 116 mg/dL — ABNORMAL HIGH (ref 70–99)
Glucose-Capillary: 131 mg/dL — ABNORMAL HIGH (ref 70–99)
Glucose-Capillary: 150 mg/dL — ABNORMAL HIGH (ref 70–99)
Glucose-Capillary: 151 mg/dL — ABNORMAL HIGH (ref 70–99)
Glucose-Capillary: 204 mg/dL — ABNORMAL HIGH (ref 70–99)
Glucose-Capillary: 86 mg/dL (ref 70–99)

## 2024-02-27 LAB — CBC WITH DIFFERENTIAL/PLATELET
Abs Immature Granulocytes: 0.05 10*3/uL (ref 0.00–0.07)
Basophils Absolute: 0 10*3/uL (ref 0.0–0.1)
Basophils Relative: 0 %
Eosinophils Absolute: 0.2 10*3/uL (ref 0.0–0.5)
Eosinophils Relative: 2 %
HCT: 28 % — ABNORMAL LOW (ref 36.0–46.0)
Hemoglobin: 9.1 g/dL — ABNORMAL LOW (ref 12.0–15.0)
Immature Granulocytes: 0 %
Lymphocytes Relative: 16 %
Lymphs Abs: 1.8 10*3/uL (ref 0.7–4.0)
MCH: 28 pg (ref 26.0–34.0)
MCHC: 32.5 g/dL (ref 30.0–36.0)
MCV: 86.2 fL (ref 80.0–100.0)
Monocytes Absolute: 1.4 10*3/uL — ABNORMAL HIGH (ref 0.1–1.0)
Monocytes Relative: 12 %
Neutro Abs: 8 10*3/uL — ABNORMAL HIGH (ref 1.7–7.7)
Neutrophils Relative %: 70 %
Platelets: 282 10*3/uL (ref 150–400)
RBC: 3.25 MIL/uL — ABNORMAL LOW (ref 3.87–5.11)
RDW: 14.3 % (ref 11.5–15.5)
WBC: 11.5 10*3/uL — ABNORMAL HIGH (ref 4.0–10.5)
nRBC: 0 % (ref 0.0–0.2)

## 2024-02-27 LAB — BRAIN NATRIURETIC PEPTIDE: B Natriuretic Peptide: 87.6 pg/mL (ref 0.0–100.0)

## 2024-02-27 LAB — BASIC METABOLIC PANEL WITH GFR
Anion gap: 7 (ref 5–15)
BUN: 9 mg/dL (ref 8–23)
CO2: 25 mmol/L (ref 22–32)
Calcium: 9 mg/dL (ref 8.9–10.3)
Chloride: 105 mmol/L (ref 98–111)
Creatinine, Ser: 0.93 mg/dL (ref 0.44–1.00)
GFR, Estimated: >60 mL/min (ref >60)
Glucose, Bld: 115 mg/dL — ABNORMAL HIGH (ref 70–99)
Potassium: 3.8 mmol/L (ref 3.5–5.1)
Sodium: 137 mmol/L (ref 135–145)

## 2024-02-27 LAB — MAGNESIUM: Magnesium: 1.6 mg/dL — ABNORMAL LOW (ref 1.7–2.4)

## 2024-02-27 LAB — ABO/RH: ABO/RH(D): B POS

## 2024-02-27 LAB — PHOSPHORUS: Phosphorus: 2.5 mg/dL (ref 2.5–4.6)

## 2024-02-27 MED ORDER — MAGNESIUM SULFATE 4 GM/100ML IV SOLN
4.0000 g | Freq: Once | INTRAVENOUS | Status: AC
  Administered 2024-02-27: 4 g via INTRAVENOUS
  Filled 2024-02-27: qty 100

## 2024-02-27 NOTE — Progress Notes (Signed)
 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 Virgia Griffins from ENT today  Patient was seen in the office last week.  At that time she had undergone an ultrasound study approximately 1 month previously showing a 1.8 cm right-sided thyroid  nodule.  Fine-needle aspiration had indicated the presence of malignant cells which were not typical of classic papillary thyroid  carcinoma.  On examination at that time, the nodule did appear to be larger than described on ultrasound.  In the interim, the patient has developed significant compressive symptoms.  She has noted a change in her voice quality.  She presented to the emergency department and underwent CT scan.  This demonstrates marked enlargement of the neoplasm involving the right thyroid  lobe.  This now appears to involve the thyroid  cartilage.  It extends posterior to the pharynx.  There are also level 2 and level 3 lymph nodes present in the right neck which may be involved with tumor.  None of these findings were present on her ultrasound in early May.  I discussed these findings today with the patient and her family.  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  We discussed possible treatment modalities.  Patient will need to be evaluated by ENT head and neck surgery immediately.  I will contact the head of the group here at [facility] health this afternoon.        [name], MD [facility] practice Office: [phone]        Chief Complaint: Enlarging thyroid  mass with compressive symptoms  Subjective: Patient in bed, comfortable, daughter and husband at bedside  Objective: Vital signs in last 24 hours: Temp:  [97 F (36.1 C)-99.6  F (37.6 C)] 97 F (36.1 C) (06/09 1140) Pulse Rate:  [77-95] 82 (06/09 1140) Resp:  [17-21] 17 (06/09 1140) BP: (126-144)/(52-99) 144/52 (06/09 1140) SpO2:  [96 %-99 %] 99 % (06/09 1140)    Intake/Output from previous day: 06/08 0701 - 06/09 0700 In: 1442.1 [P.O.:120; I.V.:1322.1] Out: 300 [Urine:300] Intake/Output this shift: No intake/output data recorded.  Physical Exam: HEENT - sclerae clear, mucous membranes moist Neck - irregular nodular right thyroid  mass, approximately 5 cm in greatest dimension, nontender; mild vocal hoarseness  Lab Results:  Recent Labs    02/26/24 0548 02/27/24 0315  WBC 12.9* 11.5*  HGB 9.8* 9.1*  HCT 29.8* 28.0*  PLT 310 282   BMET Recent Labs    02/26/24 0548 02/27/24 0315  NA 135 137  K 4.0 3.8  CL 103 105  CO2 25 25  GLUCOSE 127* 115*  BUN 13 9  CREATININE 0.79 0.93  CALCIUM  8.7* 9.0   PT/INR Recent Labs    02/26/24 0548  LABPROT 14.7  INR 1.1   Comprehensive Metabolic Panel:    Component Value Date/Time   NA 137 02/27/2024 0315   NA 135 02/26/2024 0548   NA 143 03/29/2023 1140   NA 137 02/07/2023 1018   K 3.8 02/27/2024 0315   K 4.0 02/26/2024 0548   CL 105 02/27/2024 0315   CL 103 02/26/2024 0548   CO2 25 02/27/2024 0315   CO2 25 02/26/2024 0548   BUN 9 02/27/2024 0315   BUN 13 02/26/2024 0548   BUN 19 03/29/2023 1140  BUN 25 02/07/2023 1018   CREATININE 0.93 02/27/2024 0315   CREATININE 0.79 02/26/2024 0548   CREATININE 0.82 07/20/2019 1025   CREATININE 0.95 (H) 04/17/2019 1026   GLUCOSE 115 (H) 02/27/2024 0315   GLUCOSE 127 (H) 02/26/2024 0548   CALCIUM  9.0 02/27/2024 0315   CALCIUM  8.7 (L) 02/26/2024 0548   AST 19 03/29/2023 1140   AST 19 02/07/2023 1018   ALT 25 08/12/2023 1159   ALT 17 03/29/2023 1140   ALKPHOS 83 03/29/2023 1140   ALKPHOS 98 02/07/2023 1018   BILITOT 0.5 03/29/2023 1140   BILITOT 0.5 02/07/2023 1018   PROT 6.9 03/29/2023 1140   PROT 6.8 02/07/2023 1018   ALBUMIN 4.1  03/29/2023 1140   ALBUMIN 4.1 02/07/2023 1018    Studies/Results: DG Chest Port 1 View Result Date: 02/26/2024 CLINICAL DATA:  141880 SOB (shortness of breath) 141880 EXAM: PORTABLE CHEST - 1 VIEW COMPARISON:  07/04/2007. FINDINGS: Cardiac silhouette is unremarkable. No pneumothorax or pleural effusion. The lungs are clear. Aorta is calcified. The visualized skeletal structures are unremarkable. IMPRESSION: No acute cardiopulmonary process. Electronically Signed   By: Sydell Eva M.D.   On: 02/26/2024 06:37      [name] 02/27/2024  Patient ID: Nancy Sutton, female   DOB: 1946-06-28, 78 y.o.   MRN: [id number]

## 2024-02-27 NOTE — Progress Notes (Addendum)
 PROGRESS NOTE                                                                                                                                                                                                             Patient Demographics:    Nancy Sutton, is a 78 y.o. female, DOB - July 10, 1946, WGN:[id number]  Outpatient Primary MD for the patient is [name], MD    LOS - 2  Admit date - 02/25/2024    Chief Complaint  Patient presents with   having to clear throat   Neck Pain   thyroid  mass       Brief Narrative (HPI from H&P)   78 y.o. female,  with history of stroke with residual left hemianopsia, hypertension and type 2 diabetes, history of CAD with NSTEMI March 2024 status post stent placement, patient has been recently followed by Dr. Abraham Abo on 6/3 for surgery workup for a thyroid  mass, has been cleared by cardiology 6/4 to undergo surgery, patient presents to ED as she has been having difficulty clearing her throat, and worsening neck pain, reports this symptoms been ongoing for last couple weeks, but has worsened over the last couple days, she had no problems breathing, no shortness of breath.  Initially presented to [facility] but was subsequently sent to [facility] for further surgical care of her thyroid  mass.   Subjective:   Patient in bed, appears comfortable, denies any headache, no fever, no chest pain or pressure, no shortness of breath , no abdominal pain. No new focal weakness.  To swallow liquids without any problems.   Assessment  & Plan :   Right anterior neck mass concerning for residual or reoccurring thyroid  malignancy.  Causing sensation of clearing throat persistently, per patient no frank dysphagia, currently maintaining her airway and no shortness of breath, has been seen by Dr. Sofia Dunn, was cleared by cardiology to undergo surgical procedure on 02/22/2024.  She is currently maintaining  her airway without any issues, no stridor, general surgery has been consulted and they will evaluate her on 02/27/2024 for possible surgical correction, check INR, type screen, continue to hold Plavix  last Plavix  was 02/24/2024.  Patient agreeable for blood transfusion if needed, type screen done.  History of stroke with left-sided hemianopsia.  Continue with supportive care.  Currently Plavix  on hold.  Continue  statin.  CAD s/p PCI in March 2024.  Has seen her cardiologist and was cleared to undergo surgical procedure on 02/22/2024, Plavix  on hold continue statin.    History of 1 episode of A-fib in 2024.  This was in conjunction of her NSTEMI.  Not on anticoagulation, follows with her cardiologist, continue low-dose Cardizem .  Dyslipidemia.  Continue combination of statin and Zetia.  Hypomagnesemia.  Replaced.    Essential hypertension.  On Cardizem .  DM type II on Lantus and sliding scale monitor and adjust as she is NPO.  Lantus dose has been reduced.  Lab Results  Component Value Date   HGBA1C 6.9 (A) 01/03/2024    CBG (last 3)  Recent Labs    02/26/24 2100 02/27/24 0008 02/27/24 0431  GLUCAP 208* 131* 116*         Condition - Fair  Family Communication  : Husband bedside on 02/26/2024 02/27/2024  Code Status :  Full  Consults  : CCS  PUD Prophylaxis : PPI   Procedures  :      CT - 4.1 x 2.5 x 3.9 cm heterogeneous mass in the right anterior neck at the level of the thyroid  concerning for residual/recurrent thyroid  malignancy. Associated mass effect and leftward shift of the airway without significant airway narrowing. Irregularity of the right aspect of the thyroid  cartilage adjacent to the mass concerning for possible invasion. 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 Recommend correlation with direct visualization. Retropharyngeal extension of the mass which abuts the lower hypopharynx/cervical esophagus. Associated retropharyngeal edema. Edema noted along the  right carotid space without significant vascular narrowing. Enlarged right level 2 and level 3 cervical nodes several of which demonstrate hypoattenuation concerning for metastatic lymphadenopathy with central necrosis. Lateral displacement of the right common carotid artery. Edema along the vessel with possible wall thickening which may reflect reactive inflammatory changes. No significant irregularity or narrowing to suggest vascular invasion.       Disposition Plan  :    Status is: Inpatient   DVT Prophylaxis  :    heparin  injection 5,000 Units Start: 02/26/24 1400   Lab Results  Component Value Date   PLT 282 02/27/2024    Diet :  Diet Order             Diet clear liquid Fluid consistency: Thin  Diet effective now                    Inpatient Medications  Scheduled Meds:  diltiazem   30 mg Oral BID   heparin   5,000 Units Subcutaneous Q8H   insulin  aspart  0-6 Units Subcutaneous Q4H   insulin  glargine-yfgn  8 Units Subcutaneous QHS   latanoprost   1 drop Both Eyes QHS   pantoprazole   40 mg Oral Daily   rosuvastatin   40 mg Oral Daily   timolol   1 drop Both Eyes BID   Continuous Infusions:  lactated ringers  75 mL/hr at 02/27/24 0010   magnesium sulfate bolus IVPB 4 g (02/27/24 0607)   PRN Meds:.acetaminophen  **OR** acetaminophen , hydrALAZINE , HYDROcodone-acetaminophen , morphine injection, ondansetron  **OR** ondansetron  (ZOFRAN ) IV  Antibiotics  :    Anti-infectives (From admission, onward)    None         Objective:   Vitals:   02/26/24 2056 02/27/24 0005 02/27/24 0433 02/27/24 0434  BP: (!) 140/61  (!) 141/55   Pulse: 94 83 77 81  Resp: (!) 21 (!) 21 18 20   Temp: 98.9 F (37.2 C) 99 F (37.2  C)  98.9 F (37.2 C)  TempSrc: Axillary Oral  Oral  SpO2: 97% 98% 96% 96%  Weight:      Height:        Wt Readings from Last 3 Encounters:  02/25/24 55.1 kg  01/03/24 56.3 kg  12/29/23 56.7 kg     Intake/Output Summary (Last 24 hours) at 02/27/2024  0740 Last data filed at 02/27/2024 0400 Gross per 24 hour  Intake 1442.11 ml  Output 300 ml  Net 1142.11 ml     Physical Exam  Awake Alert, No new F.N deficits, Normal affect [place].AT,PERRAL Supple Neck, No JVD, right-sided neck mass noted Symmetrical Chest wall movement, Good air movement bilaterally, CTAB RRR,No Gallops,Rubs or new Murmurs,  +ve B.Sounds, Abd Soft, No tenderness,   No Cyanosis, Clubbing or edema      Data Review:    Recent Labs  Lab 02/25/24 1217 02/26/24 0548 02/27/24 0315  WBC 13.8* 12.9* 11.5*  HGB 11.5* 9.8* 9.1*  HCT 34.7* 29.8* 28.0*  PLT 349 310 282  MCV 87.4 86.6 86.2  MCH 29.0 28.5 28.0  MCHC 33.1 32.9 32.5  RDW 13.8 14.4 14.3  LYMPHSABS  --  1.5 1.8  MONOABS  --  1.6* 1.4*  EOSABS  --  0.2 0.2  BASOSABS  --  0.0 0.0    Recent Labs  Lab 02/25/24 1217 02/26/24 0548 02/27/24 0315  NA 138 135 137  K 4.2 4.0 3.8  CL 102 103 105  CO2 26 25 25   ANIONGAP 10 7 7   GLUCOSE 184* 127* 115*  BUN 15 13 9   CREATININE 0.73 0.79 0.93  INR  --  1.1  --   BNP  --  56.7 87.6  MG  --  1.6* 1.6*  PHOS  --   --  2.5  CALCIUM  10.2 8.7* 9.0      Recent Labs  Lab 02/25/24 1217 02/26/24 0548 02/27/24 0315  INR  --  1.1  --   BNP  --  56.7 87.6  MG  --  1.6* 1.6*  CALCIUM  10.2 8.7* 9.0    --------------------------------------------------------------------------------------------------------------- Lab Results  Component Value Date   CHOL 129 08/12/2023   HDL 68 08/12/2023   LDLCALC 43 08/12/2023   TRIG 96 08/12/2023   CHOLHDL 1.9 08/12/2023    Lab Results  Component Value Date   HGBA1C 6.9 (A) 01/03/2024    Radiology Report DG Chest Port 1 View Result Date: 02/26/2024 CLINICAL DATA:  141880 SOB (shortness of breath) 141880 EXAM: PORTABLE CHEST - 1 VIEW COMPARISON:  07/04/2007. FINDINGS: Cardiac silhouette is unremarkable. No pneumothorax or pleural effusion. The lungs are clear. Aorta is calcified. The visualized skeletal  structures are unremarkable. IMPRESSION: No acute cardiopulmonary process. Electronically Signed   By: Sydell Eva M.D.   On: 02/26/2024 06:37   CT Soft Tissue Neck W Contrast Result Date: 02/25/2024 CLINICAL DATA:  Thyroid  nodule, recent diagnosis of thyroid  cancer. Neck pain, throat clearing, concern for new mass or swelling. EXAM: CT NECK WITH CONTRAST TECHNIQUE: Multidetector CT imaging of the neck was performed using the standard protocol following the bolus administration of intravenous contrast. RADIATION DOSE REDUCTION: This exam was performed according to the departmental dose-optimization program which includes automated exposure control, adjustment of the mA and/or kV according to patient size and/or use of iterative reconstruction technique. CONTRAST:  75mL OMNIPAQUE  IOHEXOL  300 MG/ML  SOLN COMPARISON:  None Available. FINDINGS: There is a 4.1 x 2.5 x 3.9 cm heterogeneous centrally hypoattenuating mass  centered in the right lower neck at the level of the thyroid  which demonstrates mild areas of peripherally enhancing soft tissue. There is a prominent calcification within the central/posterior aspect of the mass measuring up to 1.2 cm in diameter. There is associated mass effect with leftward deviation of the airway from the supraglottic airway to the extra thoracic trachea. The medial aspect of the mass abuts and possibly involves the right aspect of the thyroid  cartilage. There is retropharyngeal extension of the mass at the level of the cricoid cartilage. The mass abuts the right lateral aspect of the hypopharynx and cervical esophagus. There is retropharyngeal edema noted extending from C2 to the level of C5 measuring up to 6 mm in thickness. There is additional edema abutting the right carotid space without evidence of significant vascular narrowing. Additional masslike peripherally enhancing soft tissue involving the right sternocleidomastoid muscle observed on series 2 images 52-60. No  retrosternal extension of the mass. Pharynx and larynx: Large thyroid  mass as above with mass effect on the airway. The nasopharynx is symmetric. Symmetric appearance of the palatine tonsils. The oral cavity, floor of mouth, and base of tongue are unremarkable. Normal appearance of the epiglottis. Asymmetry of the piriform sinuses noted. The paraglottic fat appears unremarkable. There is asymmetry of the right laryngeal ventricle with slight medial positioning of the right vocal folds. Salivary glands: No inflammation, mass, or stone. Thyroid : Right thyroid  mass as above. Subcentimeter nodule in the inferior left thyroid  lobe. Lymph nodes: Right level 2A cervical node measuring 0.8 cm in short axis with possible central hypoattenuation concerning for central necrosis (series 2, image 42). Additional right level 2 B/3 cervical nodes measuring up to 1.0 cm in short axis. Vascular: Moderate atherosclerosis of the visualized aortic arch. Thyroid  mass abuts the right common carotid artery with lateral displacement of the vessel. There is edema surrounding the right common carotid artery with questionable wall thickening without evidence of vascular narrowing. Atherosclerosis at the carotid bifurcation likely with at least moderate stenosis. Limited intracranial: Limited visualization of intracranial structures without acute abnormality. Visualized orbits: Bilateral lens replacement. Orbits otherwise unremarkable. Mastoids and visualized paranasal sinuses: Mild mucosal thickening in the ethmoid sinuses. Mastoid air cells are clear. Skeleton: No acute or aggressive process. Upper chest: Negative. Other: None. IMPRESSION: 4.1 x 2.5 x 3.9 cm heterogeneous mass in the right anterior neck at the level of the thyroid  concerning for residual/recurrent thyroid  malignancy. Associated mass effect and leftward shift of the airway without significant airway narrowing. Irregularity of the right aspect of the thyroid  cartilage adjacent  to the mass concerning for possible invasion. 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 Recommend correlation with direct visualization. Retropharyngeal extension of the mass which abuts the lower hypopharynx/cervical esophagus. Associated retropharyngeal edema. Edema noted along the right carotid space without significant vascular narrowing. Enlarged right level 2 and level 3 cervical nodes several of which demonstrate hypoattenuation concerning for metastatic lymphadenopathy with central necrosis. Lateral displacement of the right common carotid artery. Edema along the vessel with possible wall thickening which may reflect reactive inflammatory changes. No significant irregularity or narrowing to suggest vascular invasion. Electronically Signed   By: Denny Flack M.D.   On: 02/25/2024 13:47     Signature  -   Lynnwood Sauer M.D on 02/27/2024 at 7:40 AM   -  To page go to [email or web address]

## 2024-02-27 NOTE — Plan of Care (Signed)

## 2024-02-27 NOTE — Plan of Care (Signed)
  Problem: Metabolic: Goal: Ability to maintain appropriate glucose levels will improve Outcome: Progressing   Problem: Skin Integrity: Goal: Risk for impaired skin integrity will decrease Outcome: Progressing   Problem: Clinical Measurements: Goal: Will remain free from infection Outcome: Progressing Goal: Diagnostic test results will improve Outcome: Progressing Goal: Respiratory complications will improve Outcome: Progressing

## 2024-02-28 ENCOUNTER — Inpatient Hospital Stay (HOSPITAL_COMMUNITY)

## 2024-02-28 DIAGNOSIS — C73 Malignant neoplasm of thyroid gland: Secondary | ICD-10-CM | POA: Diagnosis present

## 2024-02-28 DIAGNOSIS — R490 Dysphonia: Secondary | ICD-10-CM | POA: Diagnosis present

## 2024-02-28 DIAGNOSIS — R221 Localized swelling, mass and lump, neck: Secondary | ICD-10-CM | POA: Diagnosis not present

## 2024-02-28 LAB — BASIC METABOLIC PANEL WITH GFR
Anion gap: 10 (ref 5–15)
BUN: 8 mg/dL (ref 8–23)
CO2: 24 mmol/L (ref 22–32)
Calcium: 8.6 mg/dL — ABNORMAL LOW (ref 8.9–10.3)
Chloride: 104 mmol/L (ref 98–111)
Creatinine, Ser: 0.78 mg/dL (ref 0.44–1.00)
GFR, Estimated: >60 mL/min (ref >60)
Glucose, Bld: 126 mg/dL — ABNORMAL HIGH (ref 70–99)
Potassium: 3.6 mmol/L (ref 3.5–5.1)
Sodium: 138 mmol/L (ref 135–145)

## 2024-02-28 LAB — CBC WITH DIFFERENTIAL/PLATELET
Abs Immature Granulocytes: 0.08 10*3/uL — ABNORMAL HIGH (ref 0.00–0.07)
Basophils Absolute: 0 10*3/uL (ref 0.0–0.1)
Basophils Relative: 0 %
Eosinophils Absolute: 0.3 10*3/uL (ref 0.0–0.5)
Eosinophils Relative: 2 %
HCT: 28.7 % — ABNORMAL LOW (ref 36.0–46.0)
Hemoglobin: 9.4 g/dL — ABNORMAL LOW (ref 12.0–15.0)
Immature Granulocytes: 1 %
Lymphocytes Relative: 15 %
Lymphs Abs: 1.7 10*3/uL (ref 0.7–4.0)
MCH: 28.1 pg (ref 26.0–34.0)
MCHC: 32.8 g/dL (ref 30.0–36.0)
MCV: 85.9 fL (ref 80.0–100.0)
Monocytes Absolute: 0.9 10*3/uL (ref 0.1–1.0)
Monocytes Relative: 8 %
Neutro Abs: 8.5 10*3/uL — ABNORMAL HIGH (ref 1.7–7.7)
Neutrophils Relative %: 74 %
Platelets: 295 10*3/uL (ref 150–400)
RBC: 3.34 MIL/uL — ABNORMAL LOW (ref 3.87–5.11)
RDW: 14.1 % (ref 11.5–15.5)
WBC: 11.6 10*3/uL — ABNORMAL HIGH (ref 4.0–10.5)
nRBC: 0 % (ref 0.0–0.2)

## 2024-02-28 LAB — GLUCOSE, CAPILLARY
Glucose-Capillary: 97 mg/dL (ref 70–99)
Glucose-Capillary: 158 mg/dL — ABNORMAL HIGH (ref 70–99)
Glucose-Capillary: 107 mg/dL — ABNORMAL HIGH (ref 70–99)
Glucose-Capillary: 126 mg/dL — ABNORMAL HIGH (ref 70–99)
Glucose-Capillary: 116 mg/dL — ABNORMAL HIGH (ref 70–99)
Glucose-Capillary: 100 mg/dL — ABNORMAL HIGH (ref 70–99)

## 2024-02-28 LAB — MAGNESIUM: Magnesium: 2.1 mg/dL (ref 1.7–2.4)

## 2024-02-28 LAB — BRAIN NATRIURETIC PEPTIDE: B Natriuretic Peptide: 77.9 pg/mL (ref 0.0–100.0)

## 2024-02-28 LAB — PHOSPHORUS: Phosphorus: 2.9 mg/dL (ref 2.5–4.6)

## 2024-02-28 MED ORDER — IOHEXOL 350 MG/ML SOLN
75.0000 mL | Freq: Once | INTRAVENOUS | Status: AC | PRN
  Administered 2024-02-28: 75 mL via INTRAVENOUS

## 2024-02-28 MED ORDER — LACTATED RINGERS IV SOLN: INTRAVENOUS | Status: DC

## 2024-02-28 NOTE — Progress Notes (Addendum)
 New medical complaint: Rapidly enlarging thyroid  cancer  HPI: The patient is a 78 year old female who used to see me for her hearing loss and recurrent cerumen impaction.  She was recently admitted to [facility] for treatment of her rapidly enlarging thyroid  mass.  The patient saw Dr. Sofia Dunn 1 month ago for a 1.8 cm right-sided thyroid  nodule. Fine-needle aspiration had indicated the presence of malignant cells which were not typical of classic papillary thyroid  carcinoma.  She was scheduled to undergo total thyroidectomy surgery by Dr. Sofia Dunn.  Over the past month, the size of the thyroid  mass has significantly increased.  He has resulted in compressive symptoms. She presented to the emergency department and underwent CT scan. This demonstrates marked enlargement of the neoplasm involving the right thyroid  lobe.  The tumor is now greater than 4 cm in size.  This now appears to involve the thyroid  cartilage. It extends posterior to the pharynx. There are also level 2 and level 3 lymph nodes present in the right neck which may be involved with tumor.  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  Past Medical History:  Diagnosis Date   Arthritis    CAD (coronary artery disease) 03/28/2023   NSTEMI 11/2022 s/p 3 x 16 mm DES to the [facility], 3 x 20 mm DES to the mRCA    - LHC 12/13/2022: RCA ostial 95, mid 90; LCx mid 70; LAD mid 40, distal 70>> Med Rx for LAD, LCx   - TTE 12/09/2022: EF 50-55, no RWMA, mild concentric LVH, GR 2 DD, normal RVSF, mild MR, RAP 15   Diabetes mellitus    since 2008   Essential hypertension, benign 09/10/2015   GERD (gastroesophageal reflux disease)    Hypercholesteremia    Hyperlipidemia    Stroke (HCC) 03/21/2011   only deficits-affected eyesight    Past Surgical History:  Procedure Laterality Date   BIOPSY  07/02/2022   Procedure: BIOPSY;  Surgeon: Urban Garden, MD;  Location: [facility] ENDO [facility];  Service:  Gastroenterology;;   CATARACT EXTRACTION W/PHACO  11/01/2011   Procedure: CATARACT EXTRACTION PHACO AND INTRAOCULAR LENS PLACEMENT (IOC);  Surgeon: Anner Kill, MD;  Location: [facility] ORS;  Service: Ophthalmology;  Laterality: Right;  CDE 12.48   CORONARY STENT INTERVENTION N/A 12/10/2022   Procedure: CORONARY STENT INTERVENTION;  Surgeon: Arnoldo Lapping, MD;  Location: [facility] INVASIVE CV LAB;  Service: Cardiovascular;  Laterality: N/A;   ESOPHAGEAL DILATION N/A 04/29/2017   Procedure: ESOPHAGEAL DILATION;  Surgeon: Ruby Corporal, MD;  Location: [facility];  Service: Endoscopy;  Laterality: N/A;   ESOPHAGOGASTRODUODENOSCOPY N/A 04/29/2017   Procedure: ESOPHAGOGASTRODUODENOSCOPY (EGD);  Surgeon: Ruby Corporal, MD;  Location: [facility];  Service: Endoscopy;  Laterality: N/A;  1015   ESOPHAGOGASTRODUODENOSCOPY (EGD) WITH ESOPHAGEAL DILATION N/A 02/14/2013   Procedure: ESOPHAGOGASTRODUODENOSCOPY (EGD) WITH ESOPHAGEAL DILATION;  Surgeon: Ruby Corporal, MD;  Location: [facility];  Service: Endoscopy;  Laterality: N/A;  200-moved to 255 Ann to notify pt   ESOPHAGOGASTRODUODENOSCOPY (EGD) WITH PROPOFOL  N/A 05/28/2022   Procedure: ESOPHAGOGASTRODUODENOSCOPY (EGD) WITH PROPOFOL ;  Surgeon: Urban Garden, MD;  [facility]: [facility];  Service: Gastroenterology;  Laterality: N/A;  1030 ASA 2   ESOPHAGOGASTRODUODENOSCOPY (EGD) WITH PROPOFOL  N/A 07/02/2022   Procedure: ESOPHAGOGASTRODUODENOSCOPY (EGD) WITH PROPOFOL ;  Surgeon: Urban Garden, MD;  Location: [facility];  Service: Gastroenterology;  Laterality: N/A;  105 ASA 2, pt knows new time per Hugo Maes   EYE SURGERY  10-2010  left eye removal of cataract   LEFT HEART CATH AND CORONARY ANGIOGRAPHY N/A 12/10/2022   Procedure: LEFT HEART CATH AND CORONARY ANGIOGRAPHY;  Surgeon: Arnoldo Lapping, MD;  Location: [facility] INVASIVE CV LAB;  Service: Cardiovascular;  Laterality: N/A;   MASS BIOPSY N/A 02/29/2024   Procedure: BIOPSY, THYROID ;   Surgeon: Reynold Caves, MD;  Location: [facility] OR;  Service: ENT;  Laterality: N/A;   TRACHEOSTOMY TUBE PLACEMENT N/A 02/29/2024   Procedure: CREATION, TRACHEOSTOMY;  Surgeon: Reynold Caves, MD;  Location: [facility] OR;  Service: ENT;  Laterality: N/A;  WITH THYROID  BIOPSY   TUBAL LIGATION  1984    Family History  Problem Relation Age of Onset   Cancer Mother    Diabetes Mother    Cancer Father    Diabetes Father    Anesthesia problems Neg Hx    Hypotension Neg Hx    Malignant hyperthermia Neg Hx    Pseudochol deficiency Neg Hx     Social History:  reports that she has never smoked. She has never used smokeless tobacco. She reports that she does not drink alcohol and does not use drugs.  Allergies:  Allergies  Allergen Reactions   Ampicillin Hives    Breaks out in bumps Has patient had a PCN reaction causing immediate rash, facial/tongue/throat swelling, SOB or lightheadedness with hypotension: No Has patient had a PCN reaction causing severe rash involving mucus membranes or skin necrosis: Yes Has patient had a PCN reaction that required hospitalization: No Has patient had a PCN reaction occurring within the last 10 years: No If all of the above answers are NO, then may proceed with Cephalosporin use.    Bromday [Bromfenac Sodium] Itching and Swelling    Eye Drops   Lisinopril Cough   Tetanus Toxoids Hives    Breaks out in bumps    Medications: I have reviewed the patient's current medications. Prior to Admission:  Medications Prior to Admission  Medication Sig Dispense Refill Last Dose/Taking   acetaminophen  (TYLENOL ) 650 MG CR tablet Take 500-1,000 mg by mouth every 8 (eight) hours as needed for pain.   02/24/2024   calcium  carbonate (TUMS - DOSED IN MG ELEMENTAL CALCIUM ) 500 MG chewable tablet Chew 1 tablet by mouth at bedtime as needed for indigestion or heartburn.   Past Week   cholecalciferol (VITAMIN D3) 25 MCG (1000 UNIT) tablet Take 10 mcg by mouth daily.   02/24/2024 Morning    clopidogrel  (PLAVIX ) 75 MG tablet Take 75 mg by mouth at bedtime.   02/24/2024 at 10:00 PM   diltiazem  (CARDIZEM ) 30 MG tablet Take 1 tablet (30 mg total) by mouth 2 (two) times daily. Start on 04/10/23 180 tablet 3 02/25/2024 Morning   Dulaglutide  (TRULICITY ) 1.5 MG/0.5ML SOAJ Inject 1.5 mg into the skin once a week. (Patient taking differently: Inject 1.5 mg into the skin every Monday.) 8 mL 3 Past Week   ezetimibe (ZETIA) 10 MG tablet Take 10 mg by mouth at bedtime.   02/24/2024 Bedtime   insulin  aspart (NOVOLOG  FLEXPEN) 100 UNIT/ML FlexPen Inject 10-12 Units into the skin 3 (three) times daily with meals. (Patient taking differently: Inject 12-15 Units into the skin 3 (three) times daily with meals.) 30 mL 3 02/24/2024   insulin  degludec (TRESIBA  FLEXTOUCH) 200 UNIT/ML FlexTouch Pen Inject 30 Units into the skin at bedtime.   02/24/2024 Bedtime   latanoprost  (XALATAN ) 0.005 % ophthalmic solution Place 1 drop into both eyes at bedtime.  11 02/24/2024 Bedtime   metFORMIN  (GLUCOPHAGE -XR) 500 MG  24 hr tablet TAKE 1 TABLET BY MOUTH EVERY DAY WITH BREAKFAST (Patient taking differently: Take 500 mg by mouth daily with breakfast.) 90 tablet 1 02/24/2024 Morning   Multiple Vitamin (ONE-A-DAY 55 PLUS PO) Take 1 tablet by mouth daily.    02/24/2024 Morning   naproxen sodium (ALEVE) 220 MG tablet Take 220 mg by mouth 2 (two) times daily as needed (for pain alternating with Tylenol ).   02/24/2024   omeprazole  (PRILOSEC) 40 MG capsule Take 1 capsule (40 mg total) by mouth daily. 90 capsule 3 02/24/2024 Morning   Probiotic Product (PROBIOTIC DAILY PO) Take 1 tablet by mouth daily.   02/24/2024 Morning   rosuvastatin  (CRESTOR ) 40 MG tablet Take 1 tablet (40 mg total) by mouth daily. 90 tablet 3 02/24/2024 Bedtime   timolol  (BETIMOL ) 0.5 % ophthalmic solution Place 1 drop into both eyes 2 (two) times daily.   02/24/2024 Bedtime   vitamin C (ASCORBIC ACID) 500 MG tablet Take 500 mg by mouth daily.   02/24/2024 Morning   vitamin E 180 MG (400  UNITS) capsule Take 400 Units by mouth at bedtime.   02/24/2024 Bedtime    Exam: Eyes: Pupils are equal, round, reactive to light. Extraocular motion is intact.  Ears: Examination of the ears shows normal auricles and external auditory canals bilaterally. Nose: Nasal examination shows normal mucosa, septum, turbinates.  Face: Facial examination shows no asymmetry. Palpation of the face elicit no significant tenderness.  Mouth: Oral cavity examination shows no mucosal abnormalities. No significant trismus is noted.  Neck: Palpation of the neck reveals a large firm and fixed mass within the right thyroid  lobe. Neuro: Cranial nerves 2-12 are grossly intact.   Assessment: 1.  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  The size of the cancer has increased from 1.8 cm to 4 cm in 1 month. 2.  Her CT scan shows the thyroid  cancer has likely invaded the thyroid  cartilage. 3.  The patient is currently experiencing compressive symptoms from the thyroid  mass.  Plan: 1.  The physical exam findings and the imaging results are extensively discussed with the patient and her husband. 2.  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  Her son is a Surveyor, quantity.D. in [place] , and he was contacted via a phone call. 3.  Based on the above findings, the patient will likely benefit from a tracheostomy tube placement for airway protection.  An open thyroid  biopsy will also be performed for definitive histologic diagnosis.  4.  Oncology consultation. 5.  Will also consult a tertiary care head and neck surgeon for possible surgical option.

## 2024-02-28 NOTE — H&P (View-Only) (Signed)
 New medical complaint: Rapidly enlarging thyroid  cancer  HPI: The patient is a 78 year old female who used to see me for her hearing loss and recurrent cerumen impaction.  She was recently admitted to [facility] for treatment of her rapidly enlarging thyroid  mass.  The patient saw Dr. Sofia Dunn 1 month ago for a 1.8 cm right-sided thyroid  nodule. Fine-needle aspiration had indicated the presence of malignant cells which were not typical of classic papillary thyroid  carcinoma.  She was scheduled to undergo total thyroidectomy surgery by Dr. Sofia Dunn.  Over the past month, the size of the thyroid  mass has significantly increased.  He has resulted in compressive symptoms. She presented to the emergency department and underwent CT scan. This demonstrates marked enlargement of the neoplasm involving the right thyroid  lobe.  The tumor is now greater than 4 cm in size.  This now appears to involve the thyroid  cartilage. It extends posterior to the pharynx. There are also level 2 and level 3 lymph nodes present in the right neck which may be involved with tumor.  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  Exam: Eyes: Pupils are equal, round, reactive to light. Extraocular motion is intact.  Ears: Examination of the ears shows normal auricles and external auditory canals bilaterally. Nose: Nasal examination shows normal mucosa, septum, turbinates.  Face: Facial examination shows no asymmetry. Palpation of the face elicit no significant tenderness.  Mouth: Oral cavity examination shows no mucosal abnormalities. No significant trismus is noted.  Neck: Palpation of the neck reveals a large firm and fixed mass within the right thyroid  lobe. Neuro: Cranial nerves 2-12 are grossly intact.   Assessment: 1.  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  The size of the cancer has increased from 1.8 cm to 4 cm in 1  month. 2.  Her CT scan shows the thyroid  cancer has likely invaded the thyroid  cartilage. 3.  The patient is currently experiencing compressive symptoms from the thyroid  mass.  Plan: 1.  The physical exam findings and the imaging results are extensively discussed with the patient and her husband. 2.  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  Her son is a Surveyor, quantity.D. in [place] , and he was contacted via a phone call. 3.  Based on the above findings, the patient will likely benefit from a tracheostomy tube placement for airway protection.  An open thyroid  biopsy will also be performed for definitive histologic diagnosis.  4.  Oncology consultation. 5.  Will also consult a tertiary care head and neck surgeon for possible surgical option.

## 2024-02-28 NOTE — Progress Notes (Signed)
   02/28/24 1312  TOC Brief Assessment  Insurance and Status Reviewed ([employer])  Patient has primary care physician Yes [place] Records, Salena Craven, MD)  Home environment has been reviewed From home with Husband  Prior level of function: Independent  Prior/Current Home Services No current home services  Social Drivers of Health Review SDOH reviewed no interventions necessary  Readmission risk has been reviewed Yes [place] Records, Salena Craven, MD)  Transition of care needs no transition of care needs at this time   No TOC needs identified at this time. Please place a TOC consult should a need arise

## 2024-02-28 NOTE — Plan of Care (Signed)

## 2024-02-28 NOTE — Progress Notes (Signed)
 PROGRESS NOTE                                                                                                                                                                                                             Patient Demographics:    Nancy Sutton, is a 78 y.o. female, DOB - 1945/12/08, XBM:[id number]  Outpatient Primary MD for the patient is [name], MD    LOS - 3  Admit date - 02/25/2024    Chief Complaint  Patient presents with   having to clear throat   Neck Pain   thyroid  mass       Brief Narrative (HPI from H&P)   78 y.o. female,  with history of stroke with residual left hemianopsia, hypertension and type 2 diabetes, history of CAD with NSTEMI March 2024 status post stent placement, patient has been recently followed by Dr. Abraham Abo on 6/3 for surgery workup for a thyroid  mass, has been cleared by cardiology 6/4 to undergo surgery, patient presents to ED as she has been having difficulty clearing her throat, and worsening neck pain, reports this symptoms been ongoing for last couple weeks, but has worsened over the last couple days, she had no problems breathing, no shortness of breath.  Initially presented to [facility] but was subsequently sent to [facility] for further surgical care of her thyroid  mass.   Subjective:   Patient in bed in no distress no headache chest or abdominal pain, no shortness of breath or cough, she is able swallow liquids without any problems.   Assessment  & Plan :   Right anterior neck mass concerning for residual or reoccurring thyroid  malignancy.  Causing sensation of clearing throat persistently, per patient no frank dysphagia, currently maintaining her airway and no shortness of breath, has been seen by Dr. Sofia Dunn, was cleared by cardiology to undergo surgical procedure on 02/22/2024.  She is currently maintaining her airway without any issues, no stridor, General  Surgery following the patient they will consult ENT for evaluation, type screen, continue to hold Plavix  last dose of Plavix  was 02/24/2024.  Patient agreeable for blood transfusion if needed, type screen done.  History of stroke with left-sided hemianopsia.  Continue with supportive care.  Currently Plavix  on hold.  Continue statin.  CAD s/p PCI in March 2024.  Has seen her cardiologist  and was cleared to undergo surgical procedure on 02/22/2024, Plavix  on hold continue statin.    History of 1 episode of A-fib in 2024.  This was in conjunction of her NSTEMI.  Not on anticoagulation, follows with her cardiologist, continue low-dose Cardizem .  Dyslipidemia.  Continue combination of statin and Zetia.  Hypomagnesemia.  Replaced.    Essential hypertension.  On Cardizem .  DM type II on Lantus and sliding scale monitor and adjust as she is NPO.  Lantus dose has been reduced.  Lab Results  Component Value Date   HGBA1C 6.9 (A) 01/03/2024    CBG (last 3)  Recent Labs    02/27/24 2023 02/27/24 2346 02/28/24 0344  GLUCAP 204* 111* 97         Condition - Fair  Family Communication  : Husband bedside on 02/26/2024 02/27/2024  Code Status :  Full  Consults  : CCS  PUD Prophylaxis : PPI   Procedures  :      CT - 4.1 x 2.5 x 3.9 cm heterogeneous mass in the right anterior neck at the level of the thyroid  concerning for residual/recurrent thyroid  malignancy. Associated mass effect and leftward shift of the airway without significant airway narrowing. Irregularity of the right aspect of the thyroid  cartilage adjacent to the mass concerning for possible invasion. 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 Recommend correlation with direct visualization. Retropharyngeal extension of the mass which abuts the lower hypopharynx/cervical esophagus. Associated retropharyngeal edema. Edema noted along the right carotid space without significant vascular narrowing. Enlarged right level 2 and  level 3 cervical nodes several of which demonstrate hypoattenuation concerning for metastatic lymphadenopathy with central necrosis. Lateral displacement of the right common carotid artery. Edema along the vessel with possible wall thickening which may reflect reactive inflammatory changes. No significant irregularity or narrowing to suggest vascular invasion.       Disposition Plan  :    Status is: Inpatient   DVT Prophylaxis  :    heparin  injection 5,000 Units Start: 02/26/24 1400   Lab Results  Component Value Date   PLT 295 02/28/2024    Diet :  Diet Order             Diet clear liquid Fluid consistency: Thin  Diet effective now                    Inpatient Medications  Scheduled Meds:  diltiazem   30 mg Oral BID   heparin   5,000 Units Subcutaneous Q8H   insulin  aspart  0-6 Units Subcutaneous Q4H   insulin  glargine-yfgn  8 Units Subcutaneous QHS   latanoprost   1 drop Both Eyes QHS   pantoprazole   40 mg Oral Daily   rosuvastatin   40 mg Oral Daily   timolol   1 drop Both Eyes BID   Continuous Infusions:  lactated ringers  75 mL/hr at 02/27/24 1735   PRN Meds:.acetaminophen  **OR** acetaminophen , hydrALAZINE , HYDROcodone-acetaminophen , morphine injection, ondansetron  **OR** ondansetron  (ZOFRAN ) IV  Antibiotics  :    Anti-infectives (From admission, onward)    None         Objective:   Vitals:   02/27/24 2020 02/27/24 2154 02/27/24 2346 02/28/24 0345  BP: (!) 135/51 (!) 137/55 (!) 142/54 (!) 141/48  Pulse: 87  78 86  Resp: (!) 25  (!) 22 (!) 23  Temp: 99.8 F (37.7 C)  99.8 F (37.7 C) 99.2 F (37.3 C)  TempSrc: Oral  Oral Oral  SpO2: 96%  99% 94%  Weight:  Height:        Wt Readings from Last 3 Encounters:  02/25/24 55.1 kg  01/03/24 56.3 kg  12/29/23 56.7 kg    No intake or output data in the 24 hours ending 02/28/24 0741    Physical Exam  Awake Alert, No new F.N deficits, Normal affect [place].AT,PERRAL Supple Neck, No JVD,  right-sided neck mass noted Symmetrical Chest wall movement, Good air movement bilaterally, CTAB RRR,No Gallops,Rubs or new Murmurs,  +ve B.Sounds, Abd Soft, No tenderness,   No Cyanosis, Clubbing or edema      Data Review:    Recent Labs  Lab 02/25/24 1217 02/26/24 0548 02/27/24 0315 02/28/24 0513  WBC 13.8* 12.9* 11.5* 11.6*  HGB 11.5* 9.8* 9.1* 9.4*  HCT 34.7* 29.8* 28.0* 28.7*  PLT 349 310 282 295  MCV 87.4 86.6 86.2 85.9  MCH 29.0 28.5 28.0 28.1  MCHC 33.1 32.9 32.5 32.8  RDW 13.8 14.4 14.3 14.1  LYMPHSABS  --  1.5 1.8 1.7  MONOABS  --  1.6* 1.4* 0.9  EOSABS  --  0.2 0.2 0.3  BASOSABS  --  0.0 0.0 0.0    Recent Labs  Lab 02/25/24 1217 02/26/24 0548 02/27/24 0315 02/28/24 0513  NA 138 135 137 138  K 4.2 4.0 3.8 3.6  CL 102 103 105 104  CO2 26 25 25 24   ANIONGAP 10 7 7 10   GLUCOSE 184* 127* 115* 126*  BUN 15 13 9 8   CREATININE 0.73 0.79 0.93 0.78  INR  --  1.1  --   --   BNP  --  56.7 87.6 77.9  MG  --  1.6* 1.6* 2.1  PHOS  --   --  2.5 2.9  CALCIUM  10.2 8.7* 9.0 8.6*      Recent Labs  Lab 02/25/24 1217 02/26/24 0548 02/27/24 0315 02/28/24 0513  INR  --  1.1  --   --   BNP  --  56.7 87.6 77.9  MG  --  1.6* 1.6* 2.1  CALCIUM  10.2 8.7* 9.0 8.6*    --------------------------------------------------------------------------------------------------------------- Lab Results  Component Value Date   CHOL 129 08/12/2023   HDL 68 08/12/2023   LDLCALC 43 08/12/2023   TRIG 96 08/12/2023   CHOLHDL 1.9 08/12/2023    Lab Results  Component Value Date   HGBA1C 6.9 (A) 01/03/2024    Radiology Report DG Chest Port 1 View Result Date: 02/28/2024 CLINICAL DATA:  Shortness of breath. EXAM: PORTABLE CHEST 1 VIEW COMPARISON:  02/26/2024 FINDINGS: The lungs are clear without focal pneumonia, edema, pneumothorax or pleural effusion. Cardiopericardial silhouette is at upper limits of normal for size. No acute bony abnormality. Telemetry leads overlie the  chest. IMPRESSION: No active disease. Electronically Signed   By: Donnal Fusi M.D.   On: 02/28/2024 06:15     Signature  -   Lynnwood Sauer M.D on 02/28/2024 at 7:41 AM   -  To page go to [email or web address]

## 2024-02-29 ENCOUNTER — Inpatient Hospital Stay (HOSPITAL_COMMUNITY): Payer: Self-pay

## 2024-02-29 ENCOUNTER — Encounter (HOSPITAL_COMMUNITY): Payer: Self-pay | Admitting: Internal Medicine

## 2024-02-29 ENCOUNTER — Inpatient Hospital Stay (HOSPITAL_COMMUNITY)

## 2024-02-29 ENCOUNTER — Encounter (HOSPITAL_COMMUNITY)
Admission: EM | Disposition: A | Discharge status: Hospice | Payer: Self-pay | Source: Home / Self Care | Attending: Family Medicine

## 2024-02-29 ENCOUNTER — Other Ambulatory Visit: Payer: Self-pay

## 2024-02-29 DIAGNOSIS — I10 Essential (primary) hypertension: Secondary | ICD-10-CM | POA: Diagnosis not present

## 2024-02-29 DIAGNOSIS — I2510 Atherosclerotic heart disease of native coronary artery without angina pectoris: Secondary | ICD-10-CM | POA: Diagnosis not present

## 2024-02-29 DIAGNOSIS — C73 Malignant neoplasm of thyroid gland: Secondary | ICD-10-CM

## 2024-02-29 DIAGNOSIS — R221 Localized swelling, mass and lump, neck: Secondary | ICD-10-CM | POA: Diagnosis not present

## 2024-02-29 DIAGNOSIS — E119 Type 2 diabetes mellitus without complications: Secondary | ICD-10-CM

## 2024-02-29 DIAGNOSIS — I480 Paroxysmal atrial fibrillation: Secondary | ICD-10-CM | POA: Diagnosis not present

## 2024-02-29 HISTORY — PX: TRACHEOSTOMY TUBE PLACEMENT: SHX814

## 2024-02-29 HISTORY — PX: MASS BIOPSY: SHX5445

## 2024-02-29 LAB — CBC WITH DIFFERENTIAL/PLATELET
Abs Immature Granulocytes: 0.05 10*3/uL (ref 0.00–0.07)
Basophils Absolute: 0 10*3/uL (ref 0.0–0.1)
Basophils Relative: 1 %
Eosinophils Absolute: 0.3 10*3/uL (ref 0.0–0.5)
Eosinophils Relative: 4 %
HCT: 26 % — ABNORMAL LOW (ref 36.0–46.0)
Hemoglobin: 8.6 g/dL — ABNORMAL LOW (ref 12.0–15.0)
Immature Granulocytes: 1 %
Lymphocytes Relative: 18 %
Lymphs Abs: 1.5 10*3/uL (ref 0.7–4.0)
MCH: 28.2 pg (ref 26.0–34.0)
MCHC: 33.1 g/dL (ref 30.0–36.0)
MCV: 85.2 fL (ref 80.0–100.0)
Monocytes Absolute: 0.7 10*3/uL (ref 0.1–1.0)
Monocytes Relative: 8 %
Neutro Abs: 5.8 10*3/uL (ref 1.7–7.7)
Neutrophils Relative %: 68 %
Platelets: 295 10*3/uL (ref 150–400)
RBC: 3.05 MIL/uL — ABNORMAL LOW (ref 3.87–5.11)
RDW: 14 % (ref 11.5–15.5)
WBC: 8.4 10*3/uL (ref 4.0–10.5)
nRBC: 0 % (ref 0.0–0.2)

## 2024-02-29 LAB — BASIC METABOLIC PANEL WITH GFR
Anion gap: 7 (ref 5–15)
BUN: 7 mg/dL — ABNORMAL LOW (ref 8–23)
CO2: 25 mmol/L (ref 22–32)
Calcium: 8.9 mg/dL (ref 8.9–10.3)
Chloride: 107 mmol/L (ref 98–111)
Creatinine, Ser: 0.77 mg/dL (ref 0.44–1.00)
GFR, Estimated: >60 mL/min (ref >60)
Glucose, Bld: 102 mg/dL — ABNORMAL HIGH (ref 70–99)
Potassium: 3.3 mmol/L — ABNORMAL LOW (ref 3.5–5.1)
Sodium: 139 mmol/L (ref 135–145)

## 2024-02-29 LAB — GLUCOSE, CAPILLARY
Glucose-Capillary: 142 mg/dL — ABNORMAL HIGH (ref 70–99)
Glucose-Capillary: 66 mg/dL — ABNORMAL LOW (ref 70–99)
Glucose-Capillary: 89 mg/dL (ref 70–99)
Glucose-Capillary: 109 mg/dL — ABNORMAL HIGH (ref 70–99)
Glucose-Capillary: 105 mg/dL — ABNORMAL HIGH (ref 70–99)
Glucose-Capillary: 169 mg/dL — ABNORMAL HIGH (ref 70–99)
Glucose-Capillary: 277 mg/dL — ABNORMAL HIGH (ref 70–99)
Glucose-Capillary: 290 mg/dL — ABNORMAL HIGH (ref 70–99)

## 2024-02-29 LAB — PHOSPHORUS: Phosphorus: 3.3 mg/dL (ref 2.5–4.6)

## 2024-02-29 LAB — MAGNESIUM: Magnesium: 1.8 mg/dL (ref 1.7–2.4)

## 2024-02-29 LAB — BRAIN NATRIURETIC PEPTIDE: B Natriuretic Peptide: 136.2 pg/mL — ABNORMAL HIGH (ref 0.0–100.0)

## 2024-02-29 SURGERY — CREATION, TRACHEOSTOMY
Anesthesia: General | Site: Neck

## 2024-02-29 MED ORDER — DEXAMETHASONE SODIUM PHOSPHATE 10 MG/ML IJ SOLN
INTRAMUSCULAR | Status: AC
Start: 2024-02-29 — End: 2024-02-29
  Filled 2024-02-29: qty 1

## 2024-02-29 MED ORDER — MORPHINE SULFATE (PF) 2 MG/ML IV SOLN
2.0000 mg | INTRAVENOUS | Status: DC | PRN
  Administered 2024-03-02: 2 mg via INTRAVENOUS
  Filled 2024-02-29: qty 1

## 2024-02-29 MED ORDER — DEXTROSE 50 % IV SOLN
INTRAVENOUS | Status: AC
  Administered 2024-02-29: 12.5 g via INTRAVENOUS
  Filled 2024-02-29: qty 50

## 2024-02-29 MED ORDER — FENTANYL CITRATE (PF) 250 MCG/5ML IJ SOLN
INTRAMUSCULAR | Status: AC
  Filled 2024-02-29: qty 5

## 2024-02-29 MED ORDER — CHLORHEXIDINE GLUCONATE 0.12 % MT SOLN
OROMUCOSAL | Status: AC
  Administered 2024-02-29: 15 mL via OROMUCOSAL
  Filled 2024-02-29: qty 15

## 2024-02-29 MED ORDER — DEXAMETHASONE SODIUM PHOSPHATE 10 MG/ML IJ SOLN
INTRAMUSCULAR | Status: DC | PRN
  Administered 2024-02-29: 8 mg via INTRAVENOUS

## 2024-02-29 MED ORDER — SUCCINYLCHOLINE CHLORIDE 200 MG/10ML IV SOSY
PREFILLED_SYRINGE | INTRAVENOUS | Status: DC | PRN
  Administered 2024-02-29: 100 mg via INTRAVENOUS

## 2024-02-29 MED ORDER — ONDANSETRON HCL 4 MG/2ML IJ SOLN
INTRAMUSCULAR | Status: AC
  Filled 2024-02-29: qty 2

## 2024-02-29 MED ORDER — FREE WATER
100.0000 mL | Status: DC
  Administered 2024-02-29 – 2024-03-13 (×71): 100 mL

## 2024-02-29 MED ORDER — LACTATED RINGERS IV SOLN: INTRAVENOUS | Status: DC

## 2024-02-29 MED ORDER — LIDOCAINE-EPINEPHRINE 1 %-1:100000 IJ SOLN
INTRAMUSCULAR | Status: DC | PRN
  Administered 2024-02-29: 5 mL

## 2024-02-29 MED ORDER — LIDOCAINE 2% (20 MG/ML) 5 ML SYRINGE
INTRAMUSCULAR | Status: AC
  Filled 2024-02-29: qty 5

## 2024-02-29 MED ORDER — FENTANYL CITRATE (PF) 250 MCG/5ML IJ SOLN
INTRAMUSCULAR | Status: DC | PRN
  Administered 2024-02-29: 50 ug via INTRAVENOUS
  Administered 2024-02-29: 100 ug via INTRAVENOUS

## 2024-02-29 MED ORDER — PROPOFOL 10 MG/ML IV BOLUS
INTRAVENOUS | Status: AC
  Filled 2024-02-29: qty 20

## 2024-02-29 MED ORDER — ORAL CARE MOUTH RINSE: 15.0000 mL | Freq: Once | OROMUCOSAL | Status: AC

## 2024-02-29 MED ORDER — OSMOLITE 1.5 CAL PO LIQD
1000.0000 mL | ORAL | Status: DC
  Administered 2024-02-29 – 2024-03-06 (×5): 1000 mL
  Filled 2024-02-29 (×12): qty 1000

## 2024-02-29 MED ORDER — PROSOURCE TF20 ENFIT COMPATIBL EN LIQD
60.0000 mL | Freq: Every day | ENTERAL | Status: DC
  Administered 2024-02-29 – 2024-03-07 (×8): 60 mL
  Filled 2024-02-29 (×9): qty 60

## 2024-02-29 MED ORDER — POTASSIUM CHLORIDE 10 MEQ/100ML IV SOLN
10.0000 meq | INTRAVENOUS | Status: AC
  Administered 2024-02-29 (×2): 10 meq via INTRAVENOUS
  Filled 2024-02-29 (×2): qty 100

## 2024-02-29 MED ORDER — PROPOFOL 10 MG/ML IV BOLUS
INTRAVENOUS | Status: DC | PRN
  Administered 2024-02-29: 160 ug/kg/min via INTRAVENOUS
  Administered 2024-02-29: 130 mg via INTRAVENOUS

## 2024-02-29 MED ORDER — POTASSIUM CHLORIDE 10 MEQ/100ML IV SOLN
10.0000 meq | INTRAVENOUS | Status: AC
  Administered 2024-02-29: 10 meq via INTRAVENOUS
  Filled 2024-02-29 (×2): qty 100

## 2024-02-29 MED ORDER — DEXTROSE-SODIUM CHLORIDE 5-0.45 % IV SOLN: INTRAVENOUS | Status: DC

## 2024-02-29 MED ORDER — MAGNESIUM SULFATE IN D5W 1-5 GM/100ML-% IV SOLN
1.0000 g | Freq: Once | INTRAVENOUS | Status: AC
  Administered 2024-02-29: 1 g via INTRAVENOUS
  Filled 2024-02-29: qty 100

## 2024-02-29 MED ORDER — CHLORHEXIDINE GLUCONATE 0.12 % MT SOLN
15.0000 mL | Freq: Once | OROMUCOSAL | Status: AC
Start: 2024-02-29 — End: 2024-02-29

## 2024-02-29 MED ORDER — DEXTROSE 50 % IV SOLN: 12.5000 g | INTRAVENOUS | Status: AC

## 2024-02-29 MED ORDER — DILTIAZEM HCL 30 MG PO TABS
30.0000 mg | ORAL_TABLET | Freq: Two times a day (BID) | ORAL | Status: DC
  Administered 2024-02-29 – 2024-03-01 (×2): 30 mg
  Filled 2024-02-29 (×2): qty 1

## 2024-02-29 MED ORDER — CLINDAMYCIN PHOSPHATE 900 MG/50ML IV SOLN
900.0000 mg | Freq: Once | INTRAVENOUS | Status: AC
  Administered 2024-02-29: 900 mg via INTRAVENOUS

## 2024-02-29 MED ORDER — CLINDAMYCIN PHOSPHATE 900 MG/50ML IV SOLN
INTRAVENOUS | Status: AC
  Filled 2024-02-29: qty 50

## 2024-02-29 MED ORDER — ROCURONIUM BROMIDE 10 MG/ML (PF) SYRINGE
PREFILLED_SYRINGE | INTRAVENOUS | Status: AC
  Filled 2024-02-29: qty 10

## 2024-02-29 MED ORDER — ROCURONIUM BROMIDE 10 MG/ML (PF) SYRINGE
PREFILLED_SYRINGE | INTRAVENOUS | Status: DC | PRN
  Administered 2024-02-29: 20 mg via INTRAVENOUS

## 2024-02-29 MED ORDER — SUGAMMADEX SODIUM 200 MG/2ML IV SOLN
INTRAVENOUS | Status: DC | PRN
  Administered 2024-02-29: 200 mg via INTRAVENOUS

## 2024-02-29 MED ORDER — GELATIN ABSORBABLE 100 EX MISC
CUTANEOUS | Status: DC | PRN
  Administered 2024-02-29: 1

## 2024-02-29 MED ORDER — LIDOCAINE 2% (20 MG/ML) 5 ML SYRINGE
INTRAMUSCULAR | Status: DC | PRN
  Administered 2024-02-29: 60 mg via INTRAVENOUS

## 2024-02-29 SURGICAL SUPPLY — 37 items
BAG COUNTER SPONGE SURGICOUNT (BAG) ×2 IMPLANT
BLADE CLIPPER SURG (BLADE) IMPLANT
BLADE SURG 15 STRL LF DISP TIS (BLADE) IMPLANT
CANISTER SUCTION 3000ML PPV (SUCTIONS) ×2 IMPLANT
CLEANER TIP ELECTROSURG 2X2 (MISCELLANEOUS) ×2 IMPLANT
COVER SURGICAL LIGHT HANDLE (MISCELLANEOUS) ×2 IMPLANT
DRAPE HALF SHEET 40X57 (DRAPES) IMPLANT
ELECT COATED BLADE 2.86 ST (ELECTRODE) ×2 IMPLANT
ELECTRODE REM PT RTRN 9FT ADLT (ELECTROSURGICAL) ×2 IMPLANT
GAUZE 4X4 16PLY ~~LOC~~+RFID DBL (SPONGE) IMPLANT
GAUZE XEROFORM 5X9 LF (GAUZE/BANDAGES/DRESSINGS) IMPLANT
GLOVE ECLIPSE 7.5 STRL STRAW (GLOVE) ×2 IMPLANT
GOWN STRL REUS W/ TWL LRG LVL3 (GOWN DISPOSABLE) ×6 IMPLANT
HOLDER TRACH TUBE VELCRO 19.5 (MISCELLANEOUS) IMPLANT
KIT BASIN OR (CUSTOM PROCEDURE TRAY) ×2 IMPLANT
KIT SUCTION CATH 14FR (SUCTIONS) IMPLANT
KIT TURNOVER KIT B (KITS) ×2 IMPLANT
NDL HYPO 25GX1X1/2 BEV (NEEDLE) ×2 IMPLANT
NEEDLE HYPO 25GX1X1/2 BEV (NEEDLE) ×1 IMPLANT
NS IRRIG 1000ML POUR BTL (IV SOLUTION) ×2 IMPLANT
PAD ARMBOARD POSITIONER FOAM (MISCELLANEOUS) ×4 IMPLANT
PENCIL SMOKE EVACUATOR (MISCELLANEOUS) IMPLANT
SPIKE FLUID TRANSFER (MISCELLANEOUS) ×2 IMPLANT
SPONGE INTESTINAL PEANUT (DISPOSABLE) ×2 IMPLANT
SPONGE SURGIFOAM ABS GEL 100 (HEMOSTASIS) IMPLANT
SUT CHROMIC 3 0 SH 27 (SUTURE) IMPLANT
SUT PROLENE 2 0 SH DA (SUTURE) ×2 IMPLANT
SUT SILK 3-0 18XBRD TIE 12 (SUTURE) ×2 IMPLANT
SYR 20ML LL LF (SYRINGE) IMPLANT
SYR BULB EAR ULCER 3OZ GRN STR (SYRINGE) IMPLANT
SYR CONTROL 10ML LL (SYRINGE) IMPLANT
TOWEL GREEN STERILE FF (TOWEL DISPOSABLE) ×2 IMPLANT
TRAY ENT MC OR (CUSTOM PROCEDURE TRAY) ×2 IMPLANT
TUBE CONNECTING 12X1/4 (SUCTIONS) ×2 IMPLANT
TUBE TRACH 6.0 CUFF FLEX (MISCELLANEOUS) IMPLANT
TUBE TRACH FLEX 8.5 CUFF (MISCELLANEOUS) IMPLANT
WATER STERILE IRR 1000ML POUR (IV SOLUTION) ×2 IMPLANT

## 2024-02-29 NOTE — Transfer of Care (Signed)
 Immediate Anesthesia Transfer of Care Note  Patient: Nancy Sutton  Procedure(s) Performed: CREATION, TRACHEOSTOMY (Neck) BIOPSY, THYROID  (Neck)  Patient Location: ICU  Anesthesia Type:General  Level of Consciousness: drowsy and patient cooperative  Airway & Oxygen Therapy: Patient Spontanous Breathing  Post-op Assessment: Report given to RN  Post vital signs: Reviewed and stable  Last Vitals:  Vitals Value Taken Time  BP 141/54 02/29/24 1215  Temp    Pulse 76 02/29/24 1220  Resp 13 02/29/24 1220  SpO2 100 % 02/29/24 1220  Vitals shown include unfiled device data.  Last Pain:  Vitals:   02/29/24 1005  TempSrc:   PainSc: 0-No pain         Complications: No notable events documented.

## 2024-02-29 NOTE — Op Note (Signed)
 DATE OF PROCEDURE:  02/29/2024                              OPERATIVE REPORT  SURGEON:  Reynold Caves, MD  PREOPERATIVE DIAGNOSES: 1.  Anaplastic thyroid  carcinoma 2.  Airway compression  POSTOPERATIVE DIAGNOSES: 1.  Anaplastic thyroid  carcinoma 2.  Airway compression  PROCEDURE PERFORMED:   Tracheostomy Open biopsy of the right thyroid  cancer  ANESTHESIA:  General endotracheal tube anesthesia.  COMPLICATIONS:  None.  ESTIMATED BLOOD LOSS:  Minimal.  INDICATION FOR PROCEDURE:  Nancy Sutton is a 78 y.o. female with a history of a rapidly enlarging right-sided thyroid  carcinoma.  Her previous fine-needle aspiration biopsy was consistent with malignant cells.  However, the definitive histology was uncertain.  The patient was recently admitted due to the compressive symptoms of the enlarging thyroid  cancer.  The size of the cancer has increased from 1 cm to more than 4 cm over the past month.  The decision was therefore made to proceed with tracheostomy tube placement.  An open biopsy was also planned for definitive histologic identification.   The risks, benefits, alternatives, and details of the procedure were discussed with the patient and her family.  Questions were invited and answered.  Informed consent was obtained.  DESCRIPTION:  The patient was taken to the operating room and placed supine on the operating table.  She was intubated by the anesthesiologist without difficulty.  General anesthesia was administered via the endotracheal tube. The patient was positioned and prepped and draped in the standard fashion for tracheostomy tube placement. 1% lidocaine  with 1-100,000 epinephrine  was injected at the anterior neck. A 2 cm vertical incision was made in the anterior necks, at the level slightly below the cricoid bone. The incision was carried down past the level of the platysma muscle. The strep muscles were identified and divided in midline. They were retracted laterally, exposing the  thyroid  gland.  A large 4+ centimeter right thyroid  mass was noted.  2 large biopsy specimens were obtained from the thyroid  mass.  The specimens were sent to the pathology department for permanent histologic identification.    The thyroid  was divided at midline and retracted laterally. The anterior tracheal wall was exposed. A tracheal window was made at the level of the second tracheal ring. The endotracheal tube was withdrawn. A # 6 cuffed Shiley tracheostomy tube was placed without difficulty. Good end tidal volume and CO2 return was noted. The tracheostomy tube was secured in place with 2-0 Prolene sutures and circumferential necktie. The care of the patient was transferred to the anesthesiologist. The patient was awakened from anesthesia without difficulty.  She was transferred back to the intensive care unit in stable condition.  OPERATIVE FINDINGS:  A # 6 cuffed Shiley tracheostomy tube was placed without difficulty.  Two large biopsy specimens were obtained from the right large right thyroid  mass.  SPECIMEN:  None.  FOLLOWUP CARE:  The patient will return to the ICU.   Nancy Sutton W Corgan Mormile 02/29/2024 11:45 AM

## 2024-02-29 NOTE — Anesthesia Procedure Notes (Signed)
 Procedure Name: Intubation Date/Time: 02/29/2024 10:59 AM  Performed by: Raylene Calamity, CRNAPre-anesthesia Checklist: Patient identified, Emergency Drugs available, Suction available and Patient being monitored Patient Re-evaluated:Patient Re-evaluated prior to induction Oxygen Delivery Method: Circle System Utilized Preoxygenation: Pre-oxygenation with 100% oxygen Induction Type: IV induction Ventilation: Mask ventilation without difficulty Laryngoscope Size: 3 and Glidescope Grade View: Grade I Tube type: Oral Tube size: 7.0 mm Number of attempts: 1 Airway Equipment and Method: Stylet and Oral airway Placement Confirmation: ETT inserted through vocal cords under direct vision, positive ETCO2 and breath sounds checked- equal and bilateral Secured at: 21 cm Tube secured with: Tape Dental Injury: Teeth and Oropharynx as per pre-operative assessment  Comments: Straight to glide due to thyroid  mass, but pt had grade 1 view with easy advancement of ETT through cords.

## 2024-02-29 NOTE — Progress Notes (Signed)
 PROGRESS NOTE                                                                                                                                                                                                             Patient Demographics:    Nancy Sutton, is a 78 y.o. female, DOB - [birth year], BMW:[id number]  Outpatient Primary MD for the patient is [name], MD    LOS - 4  Admit date - 02/25/2024    Chief Complaint  Patient presents with   having to clear throat   Neck Pain   thyroid  mass       Brief Narrative (HPI from H&P)    78 y.o. female,  with history of stroke with residual left hemianopsia, hypertension and type 2 diabetes, history of CAD with NSTEMI March 2024 status post stent placement, patient has been recently followed by Dr. Abraham Abo on 6/3 for surgery workup for a thyroid  mass, has been cleared by cardiology 6/4 to undergo surgery, patient presents to ED as she has been having difficulty clearing her throat, and worsening neck pain, transferred from [facility] to [facility] for evaluation by general surgery, upon [facility] evaluation/10, he recommended ENT involvement, patient was seen by Dr. Bobetta Burrows with plan for tracheostomy 6/11, and open thyroid  biopsy for definitive histologic diagnosis, medical oncology, radiation oncology has been involved as well, as well Dr. Teo in communication with ENT at [facility] .   Subjective:   Patient had a good night sleep, still reports cough due to unable to clear her secretions, she had low CBG this morning resolved with D50 push.     Assessment  & Plan :   Right anterior neck/thyroid  mass concerning for thyroid  malignancy. - Rapidly enlarging right thyroid  malignancy, concerning for aggressive thyroid  cancer, likely anaplastic, plan for open thyroid  biopsy for better tissue diagnosis. - Plan for tracheostomy today for airway protection. - With further  recommendations after her surgery today and definitive histologic diagnosis, I will await further recommendation from radiation oncology and oncology pending biopsies, possible need to transfer to [facility] for radiation and chemotherapy.   History of stroke with left-sided hemianopsia.  - Continue with aspirin  and statin - Plavix  on hold due to above.  CAD s/p PCI in March 2024.  - Has seen her cardiologist and was  cleared to undergo surgical procedure on 02/22/2024, Plavix  on hold continue statin.    History of 1 episode of A-fib in 2024.  - This was in conjunction of her NSTEMI.  Not on anticoagulation, follows with her cardiologist, continue low-dose Cardizem .  Dyslipidemia.  Continue combination of statin and Zetia.  Hypomagnesemia.  Replaced.    Hypokalemia.  Placed  Essential hypertension.  On Cardizem .  DM type II on Lantus and sliding scale monitor and adjust as she is NPO.  Lantus dose has been reduced.  Lab Results  Component Value Date   HGBA1C 6.9 (A) 01/03/2024    CBG (last 3)  Recent Labs    02/28/24 2329 02/29/24 0338 02/29/24 0409  GLUCAP 100* 66* 142*         Condition - Fair  Family Communication  : Discussed with multiple family members at bedside this morning  Code Status :  Full  Consults  : CCS  PUD Prophylaxis : PPI   Procedures  :      CT - 4.1 x 2.5 x 3.9 cm heterogeneous mass in the right anterior neck at the level of the thyroid  concerning for residual/recurrent thyroid  malignancy. Associated mass effect and leftward shift of the airway without significant airway narrowing. Irregularity of the right aspect of the thyroid  cartilage adjacent to the mass concerning for possible invasion. 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 Recommend correlation with direct visualization. Retropharyngeal extension of the mass which abuts the lower hypopharynx/cervical esophagus. Associated retropharyngeal edema. Edema noted along the  right carotid space without significant vascular narrowing. Enlarged right level 2 and level 3 cervical nodes several of which demonstrate hypoattenuation concerning for metastatic lymphadenopathy with central necrosis. Lateral displacement of the right common carotid artery. Edema along the vessel with possible wall thickening which may reflect reactive inflammatory changes. No significant irregularity or narrowing to suggest vascular invasion.       Disposition Plan  :    Status is: Inpatient   DVT Prophylaxis  :    heparin  injection 5,000 Units Start: 02/26/24 1400   Lab Results  Component Value Date   PLT 295 02/29/2024    Diet :  Diet Order             Diet NPO time specified Except for: Sips with Meds  Diet effective midnight                    Inpatient Medications  Scheduled Meds:  diltiazem   30 mg Oral BID   heparin   5,000 Units Subcutaneous Q8H   insulin  aspart  0-6 Units Subcutaneous Q4H   insulin  glargine-yfgn  8 Units Subcutaneous QHS   latanoprost   1 drop Both Eyes QHS   pantoprazole   40 mg Oral Daily   rosuvastatin   40 mg Oral Daily   timolol   1 drop Both Eyes BID   Continuous Infusions:  lactated ringers  75 mL/hr at 02/28/24 2332   potassium chloride      PRN Meds:.acetaminophen  **OR** acetaminophen , hydrALAZINE , HYDROcodone-acetaminophen , morphine injection, ondansetron  **OR** ondansetron  (ZOFRAN ) IV  Antibiotics  :    Anti-infectives (From admission, onward)    None         Objective:   Vitals:   02/28/24 2000 02/28/24 2137 02/28/24 2330 02/29/24 0335  BP: (!) 147/46 (!) 150/58  (!) 135/55  Pulse:   78 80  Resp: (!) 21 20  16   Temp:   98.6 F (37 C) 98.2 F (36.8 C)  TempSrc:  Oral Oral  SpO2:      Weight:      Height:        Wt Readings from Last 3 Encounters:  02/25/24 55.1 kg  01/03/24 56.3 kg  12/29/23 56.7 kg     Intake/Output Summary (Last 24 hours) at 02/29/2024 0738 Last data filed at 02/29/2024 0600 Gross  per 24 hour  Intake 2872.9 ml  Output --  Net 2872.9 ml      Physical Exam  Awake Alert, Oriented X 3, No new F.N deficits, Normal affect Right neck mass present Symmetrical Chest wall movement, Good air movement bilaterally, CTAB RRR,No Gallops,Rubs or new Murmurs, No Parasternal Heave +ve B.Sounds, Abd Soft, No tenderness, No rebound - guarding or rigidity. No Cyanosis, Clubbing or edema, No new Rash or bruise        Data Review:    Recent Labs  Lab 02/25/24 1217 02/26/24 0548 02/27/24 0315 02/28/24 0513 02/29/24 0534  WBC 13.8* 12.9* 11.5* 11.6* 8.4  HGB 11.5* 9.8* 9.1* 9.4* 8.6*  HCT 34.7* 29.8* 28.0* 28.7* 26.0*  PLT 349 310 282 295 295  MCV 87.4 86.6 86.2 85.9 85.2  MCH 29.0 28.5 28.0 28.1 28.2  MCHC 33.1 32.9 32.5 32.8 33.1  RDW 13.8 14.4 14.3 14.1 14.0  LYMPHSABS  --  1.5 1.8 1.7 1.5  MONOABS  --  1.6* 1.4* 0.9 0.7  EOSABS  --  0.2 0.2 0.3 0.3  BASOSABS  --  0.0 0.0 0.0 0.0    Recent Labs  Lab 02/25/24 1217 02/26/24 0548 02/27/24 0315 02/28/24 0513 02/29/24 0534  NA 138 135 137 138 139  K 4.2 4.0 3.8 3.6 3.3*  CL 102 103 105 104 107  CO2 26 25 25 24 25   ANIONGAP 10 7 7 10 7   GLUCOSE 184* 127* 115* 126* 102*  BUN 15 13 9 8  7*  CREATININE 0.73 0.79 0.93 0.78 0.77  INR  --  1.1  --   --   --   BNP  --  56.7 87.6 77.9 136.2*  MG  --  1.6* 1.6* 2.1 1.8  PHOS  --   --  2.5 2.9 3.3  CALCIUM  10.2 8.7* 9.0 8.6* 8.9      Recent Labs  Lab 02/25/24 1217 02/26/24 0548 02/27/24 0315 02/28/24 0513 02/29/24 0534  INR  --  1.1  --   --   --   BNP  --  56.7 87.6 77.9 136.2*  MG  --  1.6* 1.6* 2.1 1.8  CALCIUM  10.2 8.7* 9.0 8.6* 8.9    --------------------------------------------------------------------------------------------------------------- Lab Results  Component Value Date   CHOL 129 08/12/2023   HDL 68 08/12/2023   LDLCALC 43 08/12/2023   TRIG 96 08/12/2023   CHOLHDL 1.9 08/12/2023    Lab Results  Component Value Date   HGBA1C 6.9  (A) 01/03/2024    Radiology Report CT SOFT TISSUE NECK W CONTRAST Result Date: 02/28/2024 CLINICAL DATA:  Thyroid  cancer staging. Soft tissue infection of the neck is suspected. EXAM: CT NECK WITH CONTRAST TECHNIQUE: Multidetector CT imaging of the neck was performed using the standard protocol following the bolus administration of intravenous contrast. RADIATION DOSE REDUCTION: This exam was performed according to the departmental dose-optimization program which includes automated exposure control, adjustment of the mA and/or kV according to patient size and/or use of iterative reconstruction technique. CONTRAST:  75mL OMNIPAQUE  IOHEXOL  350 MG/ML SOLN COMPARISON:  CT of the neck dated February 25, 2024. FINDINGS: A peripherally enhancing, centrally hypodense  lesion is again demonstrated within the right thyroid  fossa measuring approximately 4.3 x 2.6 x 3.7 cm. There is an ovoid calculus present within the lesion, which measures approximately 13 x 9 x 14 mm. The collection abuts the right lateral surface of the trachea and causes left lateral displacement, as before. Pharynx and larynx: There has been interval improvement of retropharyngeal edema. There is extrinsic compression of the left piriform sinus. The laryngeal soft tissues are unremarkable. Salivary glands: No inflammation, mass, or stone. Thyroid : Patent small nodules again demonstrated within the left lobe of the thyroid . The right thyroid  fossa is occupied by the peripherally enhancing lesion described above. Lymph nodes: There are mildly enlarged centrally hypodense right level 2 and 3 lymph nodes again demonstrated, which measure up to approximately 11 mm in short axis. There are no abnormally enlarged or morphologically suspicious lymph nodes present on the left. Vascular: Patent and grossly unremarkable. Limited intracranial: Negative. Visualized orbits: Status post bilateral lens replacement. Mastoids and visualized paranasal sinuses: Clear.  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 No osseous lesions are present. Upper chest: The lung apices are clear. Other: None. IMPRESSION: 1. Persistent irregular, peripherally enhancing, centrally hypodense lesion within the right thyroid  fossa, with associated mild right cervical lymphadenopathy. The lymph nodes are centrally hypodense and may represent suppurative or necrotic nodes. The findings are concerning for metastatic thyroid  cancer, right thyroid  abscess with suppurative lymphadenopathy or a combination of the two. Electronically Signed   By: Maribeth Shivers M.D.   On: 02/28/2024 13:44   CT CHEST ABDOMEN PELVIS W CONTRAST Result Date: 02/28/2024 CLINICAL DATA:  Thyroid  cancer.  Metastatic disease evaluation. EXAM: CT CHEST, ABDOMEN, AND PELVIS WITH CONTRAST TECHNIQUE: Multidetector CT imaging of the chest, abdomen and pelvis was performed following the standard protocol during bolus administration of intravenous contrast. RADIATION DOSE REDUCTION: This exam was performed according to the departmental dose-optimization program which includes automated exposure control, adjustment of the mA and/or kV according to patient size and/or use of iterative reconstruction technique. CONTRAST:  75mL OMNIPAQUE  IOHEXOL  350 MG/ML SOLN COMPARISON:  Chest radiograph dated 1625.  Neck CT dated 02/25/2024. FINDINGS: CT CHEST FINDINGS Cardiovascular: No cardiomegaly or pericardial effusion. There is 2 vessel coronary vascular calcification. Advanced atherosclerotic calcification of the thoracic aorta. No aneurysmal dilatation or dissection. The origins of the great vessels of the aortic arch and the central pulmonary arteries are patent. Mediastinum/Nodes: No hilar or mediastinal adenopathy. Small hiatal hernia. The esophagus is grossly unremarkable. Partially visualized hypodense mass or collection in the right lateral neck. See neck CT report. Lungs/Pleura: No focal  consolidation, pleural effusion, pneumothorax. The central airways are patent. Musculoskeletal: Osteopenia with degenerative changes of the spine. No acute osseous pathology. CT ABDOMEN PELVIS FINDINGS No intra-abdominal free air or free fluid. Hepatobiliary: Small liver cysts. No biliary dilatation. Multiple gallstones. No pericholecystic fluid or evidence of acute cholecystitis by CT. Pancreas: Unremarkable. No pancreatic ductal dilatation or surrounding inflammatory changes. Spleen: Normal in size without focal abnormality. Adrenals/Urinary Tract: The adrenal glands unremarkable. There is no hydronephrosis on either side. There is symmetric enhancement and excretion of contrast by both kidneys. The visualized ureters and urinary bladder appear unremarkable. Stomach/Bowel: Small hiatal hernia. There is no bowel obstruction or active inflammation. The appendix is normal. Vascular/Lymphatic: Advanced aortoiliac atherosclerotic disease. The IVC is unremarkable. No portal venous gas. There is no adenopathy. Reproductive: The uterus is grossly unremarkable. No suspicious adnexal masses. Other: None Musculoskeletal: Osteopenia with degenerative changes of spine. No  acute osseous pathology. IMPRESSION: 1. No acute intrathoracic, abdominal, or pelvic pathology. No metastatic disease in the chest, abdomen, or pelvis. 2. Partially visualized right neck mass/collection. See neck CT report. 3. Cholelithiasis. 4.  Aortic Atherosclerosis (ICD10-I70.0). Electronically Signed   By: Angus Bark M.D.   On: 02/28/2024 13:22   DG Chest Port 1 View Result Date: 02/28/2024 CLINICAL DATA:  Shortness of breath. EXAM: PORTABLE CHEST 1 VIEW COMPARISON:  02/26/2024 FINDINGS: The lungs are clear without focal pneumonia, edema, pneumothorax or pleural effusion. Cardiopericardial silhouette is at upper limits of normal for size. No acute bony abnormality. Telemetry leads overlie the chest. IMPRESSION: No active disease. Electronically  Signed   By: Donnal Fusi M.D.   On: 02/28/2024 06:15     Signature  -   Seena Dadds M.D on 02/29/2024 at 7:38 AM   -  To page go to [email or web address]

## 2024-02-29 NOTE — Progress Notes (Signed)
 Initial Nutrition Assessment  DOCUMENTATION CODES:   Not applicable  INTERVENTION:   Osmolite 1.5@45ml /hr- Initiate at 82ml/hr and increase by 10ml/hr q 8 hours until goal rate is reached.   ProSource TF 20- Give 60ml daily via tube, each supplement provides 80kcal and 20g of protein.   Free water  flushes 100ml q4 hours   Regimen provides 1700kcal/day, 88g/day protein and 1422ml/day of free water .   Pt at refeed risk; recommend monitor potassium, magnesium and phosphorus labs daily until stable  Daily weights   NUTRITION DIAGNOSIS:   Inadequate oral intake related to dysphagia as evidenced by NPO status.  GOAL:   Patient will meet greater than or equal to 90% of their needs  MONITOR:   Labs, Weight trends, TF tolerance, I & O's, Skin  REASON FOR ASSESSMENT:   Consult Enteral/tube feeding initiation and management  ASSESSMENT:   78 y/o female with h/o HLD, GERD, stroke, NSTEMI, PAF, CAD, HTN, Barrett's esophagus with esophageal dysphagia s/p dilation who is admitted with thyroid  carcinoma s/p tracheostomy and NGT placement 6/11.  Met with pt and family in room today. Pt reports good appetite and oral intake at baseline. Pt reports that she started having issues with food getting stuck in her throat on Friday and has only had liquids ever since. Pt NPO currently. NGT placed today and is noted gastric. Will plan to initiate tube feeds today. Pt is likely at refeed risk. Per chart, pt appears weight stable at baseline.   Medications reviewed and include: heparin , insulin , protonix , NaCl w/ 5% dextrose  @50ml /hr, Mg sulfate, KCl  Labs reviewed: K 3.3(L), BUN 7(L), P 3.3 wnl, Mg 1.8 wnl  Hgb 8.6(L), Hct 26.0(L) Cbgs- 105, 109, 89, 142, 66 x 24 hrs AIC 6.9(H)- 4/15  NUTRITION - FOCUSED PHYSICAL EXAM:  Flowsheet Row Most Recent Value  Orbital Region No depletion  Upper Arm Region No depletion  Thoracic and Lumbar Region No depletion  Buccal Region No depletion  Temple  Region Mild depletion  Clavicle Bone Region Mild depletion  Clavicle and Acromion Bone Region Mild depletion  Scapular Bone Region No depletion  Dorsal Hand Mild depletion  Patellar Region No depletion  Anterior Thigh Region No depletion  Posterior Calf Region No depletion  Edema (RD Assessment) None  Hair Reviewed  Eyes Reviewed  Mouth Reviewed  Skin Reviewed  Nails Reviewed   Diet Order:   Diet Order             Diet NPO time specified Except for: Sips with Meds  Diet effective midnight                  EDUCATION NEEDS:   Education needs have been addressed  Skin:  Skin Assessment: Reviewed RN Assessment (Incision neck)  Last BM:  6/7  Height:   Ht Readings from Last 1 Encounters:  02/29/24 5' (1.524 m)    Weight:   Wt Readings from Last 1 Encounters:  02/29/24 55.1 kg    Ideal Body Weight:  45.45 kg  BMI:  Body mass index is 23.72 kg/m.  Estimated Nutritional Needs:   Kcal:  1600-1800kcal/day  Protein:  80-90g/day  Fluid:  1.4-1.6L/day  Torrance Freestone MS, RD, LDN If unable to be reached, please send secure chat to RD inpatient available from 8:00a-4:00p daily

## 2024-02-29 NOTE — Plan of Care (Signed)
  Problem: Nutritional: Goal: Maintenance of adequate nutrition will improve Outcome: Progressing   Problem: Clinical Measurements: Goal: Will remain free from infection Outcome: Progressing Goal: Respiratory complications will improve Outcome: Progressing Goal: Cardiovascular complication will be avoided Outcome: Progressing   Problem: Activity: Goal: Risk for activity intolerance will decrease Outcome: Progressing   Problem: Safety: Goal: Ability to remain free from injury will improve Outcome: Progressing   Problem: Skin Integrity: Goal: Risk for impaired skin integrity will decrease Outcome: Progressing

## 2024-02-29 NOTE — Anesthesia Postprocedure Evaluation (Signed)
 Anesthesia Post Note  Patient: Nancy Sutton  Procedure(s) Performed: CREATION, TRACHEOSTOMY (Neck) BIOPSY, THYROID  (Neck)     Patient location during evaluation: PACU Anesthesia Type: General Level of consciousness: awake and alert Pain management: pain level controlled Vital Signs Assessment: post-procedure vital signs reviewed and stable Respiratory status: spontaneous breathing, respiratory function stable and patient connected to tracheostomy mask oxygen Cardiovascular status: blood pressure returned to baseline and stable Postop Assessment: no apparent nausea or vomiting Anesthetic complications: no  No notable events documented.  Last Vitals:  Vitals:   02/29/24 0943 02/29/24 1210  BP: (!) 166/59 (!) 133/47  Pulse: 84 73  Resp: 18 11  Temp: 36.9 C   SpO2: 98% 100%    Last Pain:  Vitals:   02/29/24 1005  TempSrc:   PainSc: 0-No pain   Pain Goal:                   Willian Harrow

## 2024-02-29 NOTE — Care Management Obs Status (Signed)
 MEDICARE OBSERVATION STATUS NOTIFICATION   Patient Details  Name: Nancy Sutton MRN: [id number] Date of Birth: [birth year]   Medicare Observation Status Notification Given:       Wynonia Hedges 02/29/2024, 11:50 AM

## 2024-02-29 NOTE — Anesthesia Preprocedure Evaluation (Addendum)
 Anesthesia Evaluation  Patient identified by MRN, date of birth, ID band Patient awake    Reviewed: Allergy & Precautions, NPO status , Patient's Chart, lab work & pertinent test results  Airway Mallampati: II  TM Distance: >3 FB Neck ROM: Full    Dental  (+) Teeth Intact, Dental Advisory Given   Pulmonary neg pulmonary ROS   breath sounds clear to auscultation       Cardiovascular hypertension, Pt. on home beta blockers and Pt. on medications + CAD, + Past MI and + Cardiac Stents   Rhythm:Regular Rate:Normal  Echo:  1. Left ventricular ejection fraction, by estimation, is 50 to 55%. The  left ventricle has low normal function. The left ventricle has no regional  wall motion abnormalities. There is mild concentric left ventricular  hypertrophy. Left ventricular  diastolic parameters are consistent with Grade II diastolic dysfunction  (pseudonormalization).   2. Right ventricular systolic function is normal. The right ventricular  size is normal. Tricuspid regurgitation signal is inadequate for assessing  PA pressure.   3. The mitral valve is normal in structure. Mild mitral valve  regurgitation. No evidence of mitral stenosis.   4. The aortic valve is normal in structure. Aortic valve regurgitation is  not visualized. No aortic stenosis is present.   5. The inferior vena cava is dilated in size with <50% respiratory  variability, suggesting right atrial pressure of 15 mmHg.     Neuro/Psych CVA, Residual Symptoms    GI/Hepatic Neg liver ROS,GERD  Medicated,,  Endo/Other  diabetes, Type 2, Insulin  Dependent, Oral Hypoglycemic Agents    Renal/GU negative Renal ROS     Musculoskeletal  (+) Arthritis ,    Abdominal   Peds  Hematology   Anesthesia Other Findings   Reproductive/Obstetrics                             Anesthesia Physical Anesthesia Plan  ASA: 3  Anesthesia Plan: General    Post-op Pain Management: Ofirmev  IV (intra-op)* and Precedex   Induction: Intravenous  PONV Risk Score and Plan: 4 or greater and Ondansetron , Dexamethasone  and Treatment may vary due to age or medical condition  Airway Management Planned: Oral ETT, Awake Intubation Planned and Video Laryngoscope Planned  Additional Equipment: None  Intra-op Plan:   Post-operative Plan: Possible Post-op intubation/ventilation  Informed Consent: I have reviewed the patients History and Physical, chart, labs and discussed the procedure including the risks, benefits and alternatives for the proposed anesthesia with the patient or authorized representative who has indicated his/her understanding and acceptance.     Dental advisory given  Plan Discussed with: CRNA  Anesthesia Plan Comments:        Anesthesia Quick Evaluation

## 2024-02-29 NOTE — Interval H&P Note (Signed)
 History and Physical Interval Note:  02/29/2024 10:40 AM  Nancy Sutton  has presented today for surgery, with the diagnosis of THYROID  MASS.  The various methods of treatment have been discussed with the patient and family. After consideration of risks, benefits and other options for treatment, the patient has consented to  Procedure(s) with comments: TRACHEOSTOMY (N/A) - WITH THYROID  BIOPSY as a surgical intervention.  The patient's history has been reviewed, patient examined, no change in status, stable for surgery.  I have reviewed the patient's chart and labs.  Questions were answered to the patient's satisfaction.     Nancy Sutton Nancy Sutton

## 2024-03-01 ENCOUNTER — Inpatient Hospital Stay (HOSPITAL_COMMUNITY)

## 2024-03-01 ENCOUNTER — Encounter (HOSPITAL_COMMUNITY): Payer: Self-pay | Admitting: Otolaryngology

## 2024-03-01 DIAGNOSIS — Z930 Tracheostomy status: Secondary | ICD-10-CM

## 2024-03-01 DIAGNOSIS — C73 Malignant neoplasm of thyroid gland: Secondary | ICD-10-CM | POA: Diagnosis not present

## 2024-03-01 DIAGNOSIS — R221 Localized swelling, mass and lump, neck: Secondary | ICD-10-CM | POA: Diagnosis not present

## 2024-03-01 LAB — GLUCOSE, CAPILLARY
Glucose-Capillary: 191 mg/dL — ABNORMAL HIGH (ref 70–99)
Glucose-Capillary: 204 mg/dL — ABNORMAL HIGH (ref 70–99)
Glucose-Capillary: 300 mg/dL — ABNORMAL HIGH (ref 70–99)
Glucose-Capillary: 316 mg/dL — ABNORMAL HIGH (ref 70–99)
Glucose-Capillary: 323 mg/dL — ABNORMAL HIGH (ref 70–99)
Glucose-Capillary: 91 mg/dL (ref 70–99)

## 2024-03-01 LAB — BASIC METABOLIC PANEL WITH GFR
Anion gap: 10 (ref 5–15)
BUN: 21 mg/dL (ref 8–23)
CO2: 22 mmol/L (ref 22–32)
Calcium: 8.9 mg/dL (ref 8.9–10.3)
Chloride: 104 mmol/L (ref 98–111)
Creatinine, Ser: 0.78 mg/dL (ref 0.44–1.00)
GFR, Estimated: >60 mL/min (ref >60)
Glucose, Bld: 227 mg/dL — ABNORMAL HIGH (ref 70–99)
Potassium: 4.1 mmol/L (ref 3.5–5.1)
Sodium: 136 mmol/L (ref 135–145)

## 2024-03-01 LAB — CBC WITH DIFFERENTIAL/PLATELET
Abs Immature Granulocytes: 0.13 10*3/uL — ABNORMAL HIGH (ref 0.00–0.07)
Basophils Absolute: 0 10*3/uL (ref 0.0–0.1)
Basophils Relative: 0 %
Eosinophils Absolute: 0 10*3/uL (ref 0.0–0.5)
Eosinophils Relative: 0 %
HCT: 29.3 % — ABNORMAL LOW (ref 36.0–46.0)
Hemoglobin: 9.5 g/dL — ABNORMAL LOW (ref 12.0–15.0)
Immature Granulocytes: 1 %
Lymphocytes Relative: 7 %
Lymphs Abs: 1.1 10*3/uL (ref 0.7–4.0)
MCH: 27.9 pg (ref 26.0–34.0)
MCHC: 32.4 g/dL (ref 30.0–36.0)
MCV: 86.2 fL (ref 80.0–100.0)
Monocytes Absolute: 0.8 10*3/uL (ref 0.1–1.0)
Monocytes Relative: 5 %
Neutro Abs: 14 10*3/uL — ABNORMAL HIGH (ref 1.7–7.7)
Neutrophils Relative %: 87 %
Platelets: 350 10*3/uL (ref 150–400)
RBC: 3.4 MIL/uL — ABNORMAL LOW (ref 3.87–5.11)
RDW: 13.7 % (ref 11.5–15.5)
WBC: 16 10*3/uL — ABNORMAL HIGH (ref 4.0–10.5)
nRBC: 0 % (ref 0.0–0.2)

## 2024-03-01 LAB — BRAIN NATRIURETIC PEPTIDE: B Natriuretic Peptide: 192.2 pg/mL — ABNORMAL HIGH (ref 0.0–100.0)

## 2024-03-01 LAB — BPAM RBC
Blood Product Expiration Date: 202507032359
Blood Product Expiration Date: 202507102359
Unit Type and Rh: 5100
Unit Type and Rh: 7300

## 2024-03-01 LAB — TYPE AND SCREEN
ABO/RH(D): B POS
Antibody Screen: POSITIVE
Donor AG Type: NEGATIVE
Donor AG Type: NEGATIVE
PT AG Type: NEGATIVE
Unit division: 0
Unit division: 0

## 2024-03-01 LAB — PHOSPHORUS: Phosphorus: 3.7 mg/dL (ref 2.5–4.6)

## 2024-03-01 LAB — MAGNESIUM: Magnesium: 2.1 mg/dL (ref 1.7–2.4)

## 2024-03-01 MED ORDER — INSULIN ASPART 100 UNIT/ML IJ SOLN
0.0000 [IU] | INTRAMUSCULAR | Status: DC
  Administered 2024-03-01 (×2): 11 [IU] via SUBCUTANEOUS
  Administered 2024-03-01: 8 [IU] via SUBCUTANEOUS
  Administered 2024-03-01 – 2024-03-02 (×2): 5 [IU] via SUBCUTANEOUS
  Administered 2024-03-02: 15 [IU] via SUBCUTANEOUS
  Administered 2024-03-02: 3 [IU] via SUBCUTANEOUS
  Administered 2024-03-02: 2 [IU] via SUBCUTANEOUS
  Administered 2024-03-03: 8 [IU] via SUBCUTANEOUS
  Administered 2024-03-03: 15 [IU] via SUBCUTANEOUS
  Administered 2024-03-03 (×3): 11 [IU] via SUBCUTANEOUS
  Administered 2024-03-03 – 2024-03-04 (×2): 15 [IU] via SUBCUTANEOUS
  Administered 2024-03-04: 11 [IU] via SUBCUTANEOUS
  Administered 2024-03-04: 15 [IU] via SUBCUTANEOUS

## 2024-03-01 MED ORDER — DILTIAZEM HCL 30 MG PO TABS
30.0000 mg | ORAL_TABLET | Freq: Two times a day (BID) | ORAL | Status: DC
  Administered 2024-03-01 – 2024-03-02 (×2): 30 mg via ORAL
  Filled 2024-03-01 (×4): qty 1

## 2024-03-01 MED ORDER — POLYETHYLENE GLYCOL 3350 17 G PO PACK
17.0000 g | PACK | Freq: Every day | ORAL | Status: DC
  Administered 2024-03-02: 17 g via ORAL
  Filled 2024-03-01: qty 1

## 2024-03-01 NOTE — Inpatient Diabetes Management (Signed)
 Inpatient Diabetes Program Recommendations  AACE/ADA: New Consensus Statement on Inpatient Glycemic Control (2015)  Target Ranges:  Prepandial:   less than 140 mg/dL      Peak postprandial:   less than 180 mg/dL (1-2 hours)      Critically ill patients:  140 - 180 mg/dL   Lab Results  Component Value Date   GLUCAP 323 (H) 03/01/2024   HGBA1C 6.9 (A) 01/03/2024    Review of Glycemic Control  Diabetes history: DM 2 Outpatient Diabetes medications:  Current orders for Inpatient glycemic control:  Semglee 8 units qhs Novolog  0-15 units Q4 hours  Note: decadron  8 mg given yesterday Osmolite 45 ml/hour  Inpatient Diabetes Program Recommendations:    -   Consider Novolog  3 units Q4 Tube Feed Coverage  Thanks,  Eloise Hake RN, MSN, BC-ADM Inpatient Diabetes Coordinator Team Pager [phone] (8a-5p)

## 2024-03-01 NOTE — Progress Notes (Signed)
 Subjective: No issues overnight.  On trach collar.  Using Passy-Muir valve without difficulty.  Objective: Vital signs in last 24 hours: Temp:  [97.8 F (36.6 C)-98.2 F (36.8 C)] 98.2 F (36.8 C) (06/12 1124) Pulse Rate:  [73-102] 88 (06/12 1100) Resp:  [12-25] 17 (06/12 1100) BP: (114-176)/(37-118) 152/45 (06/12 1100) SpO2:  [97 %-100 %] 100 % (06/12 1100) FiO2 (%):  [30 %] 30 % (06/12 1130) Weight:  [55.6 kg] 55.6 kg (06/12 0500)  Exam: Eyes: Pupils are equal, round, reactive to light. Extraocular motion is intact.  Ears: Examination of the ears shows normal auricles and external auditory canals bilaterally. Nose: Nasal examination shows normal mucosa, septum, turbinates.  An NG tube is in place. Face: Facial examination shows no asymmetry. Palpation of the face elicit no significant tenderness.  Mouth: Oral cavity examination shows no mucosal abnormalities. No significant trismus is noted.  Neck: Palpation of the neck reveals a large firm and fixed mass within the right thyroid  lobe.  The tracheostomy tube is in place and patent.  No bleeding is noted. Neuro: Cranial nerves 2-12 are grossly intact.  Recent Labs    02/29/24 0534 03/01/24 0400  WBC 8.4 16.0*  HGB 8.6* 9.5*  HCT 26.0* 29.3*  PLT 295 350   Recent Labs    02/29/24 0534 03/01/24 0400  NA 139 136  K 3.3* 4.1  CL 107 104  CO2 25 22  GLUCOSE 102* 227*  BUN 7* 21  CREATININE 0.77 0.78  CALCIUM  8.9 8.9    Medications: I have reviewed the patient's current medications. Scheduled:  diltiazem   30 mg Per Tube BID   feeding supplement (PROSource TF20)  60 mL Per Tube Daily   free water   100 mL Per Tube Q4H   heparin   5,000 Units Subcutaneous Q8H   insulin  aspart  0-15 Units Subcutaneous Q4H   insulin  glargine-yfgn  8 Units Subcutaneous QHS   latanoprost   1 drop Both Eyes QHS   pantoprazole   40 mg Oral Daily   rosuvastatin   40 mg Oral Daily   timolol   1 drop Both Eyes BID   Continuous:  feeding  supplement (OSMOLITE 1.5 CAL) 25 mL/hr at 03/01/24 0600    Assessment/Plan: POD #1 s/p tracheostomy. 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 - The patient is doing well with Passy-Muir valve on her tracheostomy tube. - Will change her cuffed tracheostomy tube to an uncuffed this weekend. - Awaiting pathology result. - Tolerating tube feed.   LOS: 5 days   Darnella Zeiter W Hudson Lehmkuhl 03/01/2024, 12:29 PM

## 2024-03-01 NOTE — Evaluation (Signed)
 Modified Barium Swallow Study  Patient Details  Name: Nancy Sutton MRN: [id number] Date of Birth: 1946/06/11  Today's Date: 03/01/2024  Modified Barium Swallow completed.  Full report located under Chart Review in the Imaging Section.  History of Present Illness 78 yo female presenting 6/7 with rapidly enlarging thyroid  mass. CT Neck showed 4.1 x 2.5 x 3.9 cm heterogeneous mass in the right anterior neck at  the level of the thyroid  concerning for residual/recurrent thyroid  malignancy. Associated mass effect and leftward shift of the airway without significant airway narrowing. 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 Associated retropharyngeal edema. Trach done by ENT 6/11. PMH includes: GERD, stroke, Barrett's esophagus with esophageal dysphagia s/p dilation, HLD, NSTEMI, PAF, CAD, HTN   Clinical Impression Pt's oral phase is functional but she has pharyngeal dysphagia, largely characterized by inability to fully close off her airway. She has limited anterior hyoid movement, reduced laryngeal elevation, and with lighter boluses there is no epiglottic inversion (the weight of solid boluses help invert it a little bit, but it is still incomplete). Her laryngeal vestibule closure is incomplete, and liquids enter it during the swallow (or after from small amounts of residue, mostly caught in the valleculae). Aspiration occurs with thin liquids and is inconsistently sensed (PAS 7-8, usually a throat clear as opposed to a reflexive cough). A cued cough does not eject aspirates. A few postures and strategies were attempted but aspiration still occurred with a chin tuck. She did not aspirate when turning her head to the R or when using a supraglottic swallow, but she does still penetrate and she cannot clear penetrates. Pt has penetration that is more shallow with nectar (PAS 3) and honey thick liquids  (PAS 2). Even though there is a little frank penetration with nectar thick liquids, almost all of it completely clears. Considering efficiency and safety with swallowing, recommend starting with Dys 2 diet and nectar thick liquids with PMV in place for all PO intake. SLP will f/u for potential implementation of water  protocol with use of strategies.  Factors that may increase risk of adverse event in presence of aspiration Roderick Civatte & Jessy Morocco 2021): Weak cough;Presence of tubes (ETT, trach, NG, etc.)  Swallow Evaluation Recommendations Recommendations: PO diet PO Diet Recommendation: Dysphagia 2 (Finely chopped);Mildly thick liquids (Level 2, nectar thick) Liquid Administration via: Cup;Straw Medication Administration: Crushed with puree Supervision: Patient able to self-feed;Intermittent supervision/cueing for swallowing strategies Swallowing strategies  : Place PMSV during PO intake;Slow rate;Small bites/sips Postural changes: Position pt fully upright for meals Oral care recommendations: Oral care BID (2x/day) Caregiver Recommendations: Avoid jello, ice cream, thin soups, popsicles;Remove water  pitcher;Have oral suction available      Beth Brooke., M.A. CCC-SLP Acute Rehabilitation Services Office: [phone]  Secure chat preferred  03/01/2024,3:22 PM

## 2024-03-01 NOTE — Evaluation (Signed)
 Passy-Muir Speaking Valve - Evaluation Patient Details  Name: Nancy Sutton MRN: [id number] Date of Birth: Jan 20, 1946  Today's Date: 03/01/2024 Time: 0905-0935 SLP Time Calculation (min) (ACUTE ONLY): 30 min  Past Medical History:  Past Medical History:  Diagnosis Date   Arthritis    CAD (coronary artery disease) 03/28/2023   NSTEMI 11/2022 s/p 3 x 16 mm DES to the [facility], 3 x 20 mm DES to the mRCA    - LHC 12/13/2022: RCA ostial 95, mid 90; LCx mid 70; LAD mid 40, distal 70>> Med Rx for LAD, LCx   - TTE 12/09/2022: EF 50-55, no RWMA, mild concentric LVH, GR 2 DD, normal RVSF, mild MR, RAP 15   Diabetes mellitus    since 2008   Essential hypertension, benign 09/10/2015   GERD (gastroesophageal reflux disease)    Hypercholesteremia    Hyperlipidemia    Stroke (HCC) 03/21/2011   only deficits-affected eyesight   Past Surgical History:  Past Surgical History:  Procedure Laterality Date   BIOPSY  07/02/2022   Procedure: BIOPSY;  Surgeon: Urban Garden, MD;  [facility];  Service: Gastroenterology;;   CATARACT EXTRACTION W/PHACO  11/01/2011   Procedure: CATARACT EXTRACTION PHACO AND INTRAOCULAR LENS PLACEMENT (IOC);  Surgeon: Anner Kill, MD;  Location: [facility];  Service: Ophthalmology;  Laterality: Right;  CDE 12.48   CORONARY STENT INTERVENTION N/A 12/10/2022   Procedure: CORONARY STENT INTERVENTION;  Surgeon: Arnoldo Lapping, MD;  Location: [facility] INVASIVE CV LAB;  Service: Cardiovascular;  Laterality: N/A;   ESOPHAGEAL DILATION N/A 04/29/2017   Procedure: ESOPHAGEAL DILATION;  Surgeon: Ruby Corporal, MD;  [facility]: [facility];  Service: Endoscopy;  Laterality: N/A;   ESOPHAGOGASTRODUODENOSCOPY N/A 04/29/2017   Procedure: ESOPHAGOGASTRODUODENOSCOPY (EGD);  Surgeon: Ruby Corporal, MD;  [facility]: [facility];  Service: Endoscopy;  Laterality: N/A;  1015   ESOPHAGOGASTRODUODENOSCOPY (EGD) WITH ESOPHAGEAL DILATION N/A 02/14/2013   Procedure:  ESOPHAGOGASTRODUODENOSCOPY (EGD) WITH ESOPHAGEAL DILATION;  Surgeon: Ruby Corporal, MD;  [facility];  Service: Endoscopy;  Laterality: N/A;  200-moved to 255 Ann to notify pt   ESOPHAGOGASTRODUODENOSCOPY (EGD) WITH PROPOFOL  N/A 05/28/2022   Procedure: ESOPHAGOGASTRODUODENOSCOPY (EGD) WITH PROPOFOL ;  Surgeon: Urban Garden, MD;  [facility];  Service: Gastroenterology;  Laterality: N/A;  1030 ASA 2   ESOPHAGOGASTRODUODENOSCOPY (EGD) WITH PROPOFOL  N/A 07/02/2022   Procedure: ESOPHAGOGASTRODUODENOSCOPY (EGD) WITH PROPOFOL ;  Surgeon: Urban Garden, MD;  [facility];  Service: Gastroenterology;  Laterality: N/A;  105 ASA 2, pt knows new time per Hugo Maes   EYE SURGERY  10-2010   left eye removal of cataract   LEFT HEART CATH AND CORONARY ANGIOGRAPHY N/A 12/10/2022   Procedure: LEFT HEART CATH AND CORONARY ANGIOGRAPHY;  Surgeon: Arnoldo Lapping, MD;  Location: [facility] INVASIVE CV LAB;  Service: Cardiovascular;  Laterality: N/A;   TUBAL LIGATION  1984   HPI:  78 yo female presenting 6/7 with rapidly enlarging thyroid  mass. CT Neck showed 4.1 x 2.5 x 3.9 cm heterogeneous mass in the right anterior neck at  the level of the thyroid  concerning for residual/recurrent thyroid  malignancy. Associated mass effect and leftward shift of the airway without significant airway narrowing. 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 Associated retropharyngeal edema. Trach done by ENT 6/11. PMH includes: GERD, stroke, Barrett's esophagus with esophageal dysphagia s/p dilation, HLD, NSTEMI, PAF, CAD, HTN    Assessment / Plan /  Recommendation  Clinical Impression  Pt had a strong, productive cough upon cuff deflation, expectorating secretions out of her mouth and therefore avoiding tracheal suction. Upon PMV placement, she achieved immediate phonation that was fairly  clear. She denies any dysphonia PTA and believes that her voice with PMV is near baseline. Pt wore the valve for 45 minutes without overt signs of intolerance: no back pressure upon interval removal, VS stable, no increase in WOB. Pt should wear PMV as much as can be tolerated during waking hours. She notes that she would likely not be able to don/doff the valve herself due to her arthritia, but her son was taught how to do so and returned demonstration x2. Education was also initiated regarding maintenance and cleaning. SLP will continue to follow acutely.  SLP Visit Diagnosis: Aphonia (R49.1)    Recommendations for use/ supervision  Patient may use Passy-Muir Speech Valve: During all waking hours (remove during sleep);Caregiver trained to provide supervision PMSV Supervision: Intermittent   SLP Assessment  Patient needs continued Speech Language Pathology Services   Assistance Recommended at Discharge    Functional Status Assessment Patient has had a recent decline in their functional status and demonstrates the ability to make significant improvements in function in a reasonable and predictable amount of time.  Frequency and Duration min 2x/week  2 weeks    PMSV Trial PMSV was placed for: 45 Able to redirect subglottic air through upper airway: Yes Able to Attain Phonation: Yes Voice Quality: Normal (close to baseline per pt/son) Able to Expectorate Secretions: Yes Level of Secretion Expectoration with PMSV: Oral Breath Support for Phonation: Adequate Intelligibility: Intelligible Respirations During Trial: 18 SpO2 During Trial: 100 % Pulse During Trial: 92 Behavior: Alert;Controlled;Cooperative;Expresses self well   Tracheostomy Tube       Vent Dependency  FiO2 (%): 30 %    Cuff Deflation Trial Tolerated Cuff Deflation: Yes Length of Time for Cuff Deflation Trial: 47 Behavior: Alert;Controlled;Cooperative         Beth Brooke., M.A. CCC-SLP Acute Rehabilitation  Services Office: [phone]  Secure chat preferred  03/01/2024, 10:13 AM

## 2024-03-01 NOTE — Evaluation (Signed)
 Clinical/Bedside Swallow Evaluation Patient Details  Name: Nancy Sutton MRN: [id number] Date of Birth: 03-Oct-1945  Today's Date: 03/01/2024 Time: SLP Start Time (ACUTE ONLY): 0935 SLP Stop Time (ACUTE ONLY): 0958 SLP Time Calculation (min) (ACUTE ONLY): 23 min  Past Medical History:  Past Medical History:  Diagnosis Date   Arthritis    CAD (coronary artery disease) 03/28/2023   NSTEMI 11/2022 s/p 3 x 16 mm DES to the [facility], 3 x 20 mm DES to the mRCA    - LHC 12/13/2022: RCA ostial 95, mid 90; LCx mid 70; LAD mid 40, distal 70>> Med Rx for LAD, LCx   - TTE 12/09/2022: EF 50-55, no RWMA, mild concentric LVH, GR 2 DD, normal RVSF, mild MR, RAP 15   Diabetes mellitus    since 2008   Essential hypertension, benign 09/10/2015   GERD (gastroesophageal reflux disease)    Hypercholesteremia    Hyperlipidemia    Stroke (HCC) 03/21/2011   only deficits-affected eyesight   Past Surgical History:  Past Surgical History:  Procedure Laterality Date   BIOPSY  07/02/2022   Procedure: BIOPSY;  Surgeon: Urban Garden, MD;  [facility];  Service: Gastroenterology;;   CATARACT EXTRACTION W/PHACO  11/01/2011   Procedure: CATARACT EXTRACTION PHACO AND INTRAOCULAR LENS PLACEMENT (IOC);  Surgeon: Anner Kill, MD;  Location: [facility];  Service: Ophthalmology;  Laterality: Right;  CDE 12.48   CORONARY STENT INTERVENTION N/A 12/10/2022   Procedure: CORONARY STENT INTERVENTION;  Surgeon: Arnoldo Lapping, MD;  Location: [facility] INVASIVE CV LAB;  Service: Cardiovascular;  Laterality: N/A;   ESOPHAGEAL DILATION N/A 04/29/2017   Procedure: ESOPHAGEAL DILATION;  Surgeon: Ruby Corporal, MD;  [facility];  Service: Endoscopy;  Laterality: N/A;   ESOPHAGOGASTRODUODENOSCOPY N/A 04/29/2017   Procedure: ESOPHAGOGASTRODUODENOSCOPY (EGD);  Surgeon: Ruby Corporal, MD;  [facility];  Service: Endoscopy;  Laterality: N/A;  1015   ESOPHAGOGASTRODUODENOSCOPY (EGD) WITH ESOPHAGEAL  DILATION N/A 02/14/2013   Procedure: ESOPHAGOGASTRODUODENOSCOPY (EGD) WITH ESOPHAGEAL DILATION;  Surgeon: Ruby Corporal, MD;  [facility];  Service: Endoscopy;  Laterality: N/A;  200-moved to 255 Ann to notify pt   ESOPHAGOGASTRODUODENOSCOPY (EGD) WITH PROPOFOL  N/A 05/28/2022   Procedure: ESOPHAGOGASTRODUODENOSCOPY (EGD) WITH PROPOFOL ;  Surgeon: Urban Garden, MD;  [facility];  Service: Gastroenterology;  Laterality: N/A;  1030 ASA 2   ESOPHAGOGASTRODUODENOSCOPY (EGD) WITH PROPOFOL  N/A 07/02/2022   Procedure: ESOPHAGOGASTRODUODENOSCOPY (EGD) WITH PROPOFOL ;  Surgeon: Urban Garden, MD;  [facility]: [facility];  Service: Gastroenterology;  Laterality: N/A;  105 ASA 2, pt knows new time per Hugo Maes   EYE SURGERY  10-2010   left eye removal of cataract   LEFT HEART CATH AND CORONARY ANGIOGRAPHY N/A 12/10/2022   Procedure: LEFT HEART CATH AND CORONARY ANGIOGRAPHY;  Surgeon: Arnoldo Lapping, MD;  Location: [facility] INVASIVE CV LAB;  Service: Cardiovascular;  Laterality: N/A;   TUBAL LIGATION  1984   HPI:  78 yo female presenting 6/7 with rapidly enlarging thyroid  mass. CT Neck showed 4.1 x 2.5 x 3.9 cm heterogeneous mass in the right anterior neck at  the level of the thyroid  concerning for residual/recurrent thyroid  malignancy. Associated mass effect and leftward shift of the airway without significant airway narrowing. 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 Associated retropharyngeal edema. Trach done by ENT 6/11. PMH includes: GERD, stroke, Barrett's esophagus with esophageal dysphagia s/p dilation, HLD, NSTEMI,  PAF, CAD, HTN    Assessment / Plan / Recommendation  Clinical Impression  Pt denies any trouble with swallowing PTA, but does endorse persistent throat clearing and globus sensation. She did not think that this was related to any PO intake though.  Vocal quality is pretty clear with PMV in place and her cough is productive even prior to PMV placement. Throat clearing was noted prior to any PO intake and did not necessarily seem to occur along side ice chips trials, but pt politely declined other trials, including sips of water . Suspect that she will do well with swallowing, but would go ahead and proceed with instrumental testing to establish baseline and more closely evaluate in the setting of her new trach. Will complete as soon as this can be scheduled in radiology. SLP Visit Diagnosis: Dysphagia, unspecified (R13.10)    Aspiration Risk       Diet Recommendation NPO;Ice chips PRN after oral care    Medication Administration: Via alternative means    Other  Recommendations Oral Care Recommendations: Oral care QID;Oral care prior to ice chip/H20 Caregiver Recommendations: Have oral suction available     Assistance Recommended at Discharge    Functional Status Assessment Patient has had a recent decline in their functional status and demonstrates the ability to make significant improvements in function in a reasonable and predictable amount of time.  Frequency and Duration min 2x/week          Prognosis Prognosis for improved oropharyngeal function: Good      Swallow Study   General HPI: 78 yo female presenting 6/7 with rapidly enlarging thyroid  mass. CT Neck showed 4.1 x 2.5 x 3.9 cm heterogeneous mass in the right anterior neck at  the level of the thyroid  concerning for residual/recurrent thyroid  malignancy. Associated mass effect and leftward shift of the airway without significant airway narrowing. 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 Associated retropharyngeal edema. Trach done by ENT 6/11. PMH includes: GERD, stroke, Barrett's esophagus with esophageal dysphagia s/p dilation, HLD, NSTEMI, PAF, CAD, HTN Type of Study:  Bedside Swallow Evaluation Previous Swallow Assessment: none in chart Diet Prior to this Study: NPO;Large bore NG tube Temperature Spikes Noted: No Respiratory Status: Trach Collar;Trach Trach Size and Type: Cuff;#6;Deflated;With PMSV in place History of Recent Intubation: Yes Total duration of intubation (days):  (for procedure only) Date extubated: 02/29/24 Behavior/Cognition: Alert;Cooperative Oral Cavity Assessment: Within Functional Limits (small bump on tongue, says it has always been there) Oral Care Completed by SLP: No Oral Cavity - Dentition: Adequate natural dentition Vision: Functional for self-feeding Self-Feeding Abilities: Needs assist Patient Positioning: Upright in bed Baseline Vocal Quality: Normal Volitional Cough: Strong Volitional Swallow: Able to elicit    Oral/Motor/Sensory Function Overall Oral Motor/Sensory Function: Within functional limits   Ice Chips Ice chips: Within functional limits Presentation: Spoon   Thin Liquid Thin Liquid: Not tested    Nectar Thick Nectar Thick Liquid: Not tested   Honey Thick Honey Thick Liquid: Not tested   Puree Puree: Not tested   Solid     Solid: Not tested      Beth Brooke., M.A. CCC-SLP Acute Rehabilitation Services Office: [phone]  Secure chat preferred  03/01/2024,10:19 AM

## 2024-03-01 NOTE — Progress Notes (Signed)
 PROGRESS NOTE    Nancy Sutton  NGE:[id number] DOB: 1946/09/14 DOA: 02/25/2024 PCP: [name], MD  Chief Complaint  Patient presents with   having to clear throat   Neck Pain   thyroid  mass    Brief Narrative:   78 y.o. female,  with history of stroke with residual left hemianopsia, hypertension and type 2 diabetes, history of CAD with NSTEMI March 2024 status post stent placement, patient has been recently followed by Dr. Abraham Abo on 6/3 for surgery workup for [name] thyroid  mass, has been cleared by cardiology 6/4 to undergo surgery, patient presents to ED as she has been having difficulty clearing her throat, and worsening neck pain, transferred from [facility] to [facility] for evaluation by general surgery, upon [facility] evaluation/10, he recommended ENT involvement, patient was seen by Dr. Bobetta Burrows with plan for tracheostomy 6/11, and open thyroid  biopsy for definitive histologic diagnosis, medical oncology, radiation oncology has been involved as well, as well Dr. Bobetta Burrows in communication with ENT at [facility]   Assessment & Plan:   Principal Problem:   Neck mass Active Problems:   Hyperlipidemia   GERD (gastroesophageal reflux disease)   Cerebral infarction (HCC)   Uncontrolled type 1 diabetes mellitus with hyperglycemia (HCC)   NSTEMI (non-ST elevated myocardial infarction) (HCC)   Paroxysmal atrial fibrillation (HCC)   CAD (coronary artery disease)   Anaplastic thyroid  carcinoma (HCC)   Hoarseness   Thyroid  cancer (HCC)   Tracheostomy status (HCC)  Right anterior neck/thyroid  mass concerning for thyroid  malignancy. - Rapidly enlarging right thyroid  malignancy, concerning for aggressive thyroid  cancer, likely anaplastic - now s/p open thyroid  biopsy and tracheostomy  - surgical pathology pending from 6/11 - CT chest abdomen pelvis without metastatic disease visualized - CT neck with irregular peripherally enhancing centrally hypodense lesion within the right thyroid  fossa  with associated mild right cervical lymphadenopathy.  LN's centrally hypodense (suppurative or necrotic) (concerning for metastatic thyroid  cancer - see report) - With further recommendations after her surgery today and definitive histologic diagnosis, I will await further recommendation from radiation oncology and oncology pending biopsy results, possible need to transfer to [facility] for radiation and chemotherapy. - cortrak in place, getting tube feeds - SLP to evaluate    History of stroke with left-sided hemianopsia.  - Continue with aspirin  and statin - Plavix  on hold due to above.   CAD s/p PCI in March 2024.  - Has seen her cardiologist and was cleared to undergo surgical procedure on 02/22/2024, Plavix  on hold continue statin.     History of 1 episode of Alfhild Partch-fib in 2024.  - This was in conjunction of her NSTEMI.  Not on anticoagulation, follows with her cardiologist, continue low-dose Cardizem .   Dyslipidemia.  Continue combination of statin and Zetia.   Hypomagnesemia.  Replaced.     Hypokalemia.  Placed   Essential hypertension.  On Cardizem .   DM type II  A1c 12/2023 6.9  Lantus 8 units, SSI - adjust as needed  Glaucoma Timolol      DVT prophylaxis: heparin   Code Status: full Family Communication: son at bedside Disposition:   Status is: Inpatient Remains inpatient appropriate because: need for inpatient care   Consultants:  ENT General Surgery  Procedures:  6/11 PROCEDURE PERFORMED:   Tracheostomy Open biopsy of the right thyroid  cancer  Antimicrobials:  Anti-infectives (From admission, onward)    Start     Dose/Rate Route Frequency Ordered Stop   02/29/24 1000  clindamycin (CLEOCIN) IVPB 900  mg        900 mg 100 mL/hr over 30 Minutes Intravenous  Once 02/29/24 0955 02/29/24 1106   02/29/24 0957  clindamycin (CLEOCIN) 900 MG/50ML IVPB       Note to Pharmacy: Clydell Darnel E: cabinet override      02/29/24 0957 02/29/24 1111        Subjective: No complaints Son at bedside  Objective: Vitals:   03/01/24 0900 03/01/24 1000 03/01/24 1100 03/01/24 1124  BP: (!) 153/118 (!) 144/50 (!) 152/45   Pulse: 94 96 88   Resp: 19 20 17    Temp:    98.2 F (36.8 C)  TempSrc:    Oral  SpO2: 100% 100% 100%   Weight:      Height:        Intake/Output Summary (Last 24 hours) at 03/01/2024 1449 Last data filed at 03/01/2024 0600 Gross per 24 hour  Intake 996.7 ml  Output 1750 ml  Net -753.3 ml   Filed Weights   02/25/24 1954 02/29/24 0943 03/01/24 0500  Weight: 55.1 kg 55.1 kg 55.6 kg    Examination:  General exam: Appears calm and comfortable  Respiratory system: trach, unlabored Cardiovascular system: RRR Gastrointestinal system: Abdomen is nondistended, soft and nontender.  Central nervous system: Alert and oriented. No focal neurological deficits. Extremities: no LEE   Data Reviewed: I have personally reviewed following labs and imaging studies  CBC: Recent Labs  Lab 02/26/24 0548 02/27/24 0315 02/28/24 0513 02/29/24 0534 03/01/24 0400  WBC 12.9* 11.5* 11.6* 8.4 16.0*  NEUTROABS 9.6* 8.0* 8.5* 5.8 14.0*  HGB 9.8* 9.1* 9.4* 8.6* 9.5*  HCT 29.8* 28.0* 28.7* 26.0* 29.3*  MCV 86.6 86.2 85.9 85.2 86.2  PLT 310 282 295 295 350    Basic Metabolic Panel: Recent Labs  Lab 02/26/24 0548 02/27/24 0315 02/28/24 0513 02/29/24 0534 03/01/24 0400  NA 135 137 138 139 136  K 4.0 3.8 3.6 3.3* 4.1  CL 103 105 104 107 104  CO2 25 25 24 25 22   GLUCOSE 127* 115* 126* 102* 227*  BUN 13 9 8  7* 21  CREATININE 0.79 0.93 0.78 0.77 0.78  CALCIUM  8.7* 9.0 8.6* 8.9 8.9  MG 1.6* 1.6* 2.1 1.8 2.1  PHOS  --  2.5 2.9 3.3 3.7    GFR: Estimated Creatinine Clearance: 45.3 mL/min (by C-G formula based on SCr of 0.78 mg/dL).  Liver Function Tests: No results for input(s): AST, ALT, ALKPHOS, BILITOT, PROT, ALBUMIN in the last 168 hours.  CBG: Recent Labs  Lab 02/29/24 1950 02/29/24 2314  03/01/24 0323 03/01/24 0809 03/01/24 1122  GLUCAP 277* 290* 191* 323* 300*     No results found for this or any previous visit (from the past 240 hours).       Radiology Studies: DG Abd Portable 1V Result Date: 02/29/2024 CLINICAL DATA:  NG placement. EXAM: PORTABLE ABDOMEN - 1 VIEW COMPARISON:  None Available. FINDINGS: Partially visualized enteric tube with tip likely in the distal stomach. No bowel dilatation. Air is noted in the colon. Degenerative changes of the spine. No acute osseous pathology. IMPRESSION: Enteric tube with tip likely in the distal stomach. Electronically Signed   By: Angus Bark M.D.   On: 02/29/2024 14:30        Scheduled Meds:  diltiazem   30 mg Per Tube BID   feeding supplement (PROSource TF20)  60 mL Per Tube Daily   free water   100 mL Per Tube Q4H   heparin   5,000 Units  Subcutaneous Q8H   insulin  aspart  0-15 Units Subcutaneous Q4H   insulin  glargine-yfgn  8 Units Subcutaneous QHS   latanoprost   1 drop Both Eyes QHS   pantoprazole   40 mg Oral Daily   polyethylene glycol  17 g Oral Daily   rosuvastatin   40 mg Oral Daily   timolol   1 drop Both Eyes BID   Continuous Infusions:  feeding supplement (OSMOLITE 1.5 CAL) 25 mL/hr at 03/01/24 0600     LOS: 5 days    Time spent: over 30 min    Donnetta Gains, MD [facility]   To contact the attending provider between 7A-7P or the covering provider during after hours 7P-7A, please log into the web site [email or web address] and access using universal [place] password for that web site. If you do not have the password, please call the hospital operator.  03/01/2024, 2:49 PM

## 2024-03-02 ENCOUNTER — Inpatient Hospital Stay (HOSPITAL_COMMUNITY)

## 2024-03-02 DIAGNOSIS — I69398 Other sequelae of cerebral infarction: Secondary | ICD-10-CM | POA: Diagnosis not present

## 2024-03-02 DIAGNOSIS — Z794 Long term (current) use of insulin: Secondary | ICD-10-CM

## 2024-03-02 DIAGNOSIS — I69391 Dysphagia following cerebral infarction: Secondary | ICD-10-CM | POA: Diagnosis not present

## 2024-03-02 DIAGNOSIS — E1165 Type 2 diabetes mellitus with hyperglycemia: Secondary | ICD-10-CM

## 2024-03-02 DIAGNOSIS — H53462 Homonymous bilateral field defects, left side: Secondary | ICD-10-CM

## 2024-03-02 DIAGNOSIS — I6389 Other cerebral infarction: Secondary | ICD-10-CM

## 2024-03-02 DIAGNOSIS — Z7985 Long-term (current) use of injectable non-insulin antidiabetic drugs: Secondary | ICD-10-CM

## 2024-03-02 DIAGNOSIS — I10 Essential (primary) hypertension: Secondary | ICD-10-CM

## 2024-03-02 DIAGNOSIS — R29706 NIHSS score 6: Secondary | ICD-10-CM

## 2024-03-02 DIAGNOSIS — E785 Hyperlipidemia, unspecified: Secondary | ICD-10-CM

## 2024-03-02 DIAGNOSIS — Z7984 Long term (current) use of oral hypoglycemic drugs: Secondary | ICD-10-CM

## 2024-03-02 DIAGNOSIS — C73 Malignant neoplasm of thyroid gland: Secondary | ICD-10-CM | POA: Diagnosis not present

## 2024-03-02 DIAGNOSIS — R29707 NIHSS score 7: Secondary | ICD-10-CM | POA: Diagnosis not present

## 2024-03-02 DIAGNOSIS — Z930 Tracheostomy status: Secondary | ICD-10-CM | POA: Diagnosis not present

## 2024-03-02 DIAGNOSIS — R221 Localized swelling, mass and lump, neck: Secondary | ICD-10-CM | POA: Diagnosis not present

## 2024-03-02 DIAGNOSIS — I63231 Cerebral infarction due to unspecified occlusion or stenosis of right carotid arteries: Secondary | ICD-10-CM | POA: Diagnosis not present

## 2024-03-02 LAB — ECHOCARDIOGRAM COMPLETE
AR max vel: 1.8 cm2
AV Area VTI: 2 cm2
AV Area mean vel: 1.87 cm2
AV Mean grad: 4 mmHg
AV Peak grad: 7.3 mmHg
Ao pk vel: 1.35 m/s
Area-P 1/2: 4.31 cm2
Height: 60 in
S' Lateral: 3.15 cm
Weight: 1961.21 [oz_av]

## 2024-03-02 LAB — GLUCOSE, CAPILLARY
Glucose-Capillary: 169 mg/dL — ABNORMAL HIGH (ref 70–99)
Glucose-Capillary: 138 mg/dL — ABNORMAL HIGH (ref 70–99)
Glucose-Capillary: 210 mg/dL — ABNORMAL HIGH (ref 70–99)
Glucose-Capillary: 209 mg/dL — ABNORMAL HIGH (ref 70–99)
Glucose-Capillary: 388 mg/dL — ABNORMAL HIGH (ref 70–99)

## 2024-03-02 LAB — BASIC METABOLIC PANEL WITH GFR
Anion gap: 10 (ref 5–15)
BUN: 18 mg/dL (ref 8–23)
CO2: 25 mmol/L (ref 22–32)
Calcium: 8.9 mg/dL (ref 8.9–10.3)
Chloride: 101 mmol/L (ref 98–111)
Creatinine, Ser: 0.73 mg/dL (ref 0.44–1.00)
GFR, Estimated: >60 mL/min (ref >60)
Glucose, Bld: 137 mg/dL — ABNORMAL HIGH (ref 70–99)
Potassium: 4 mmol/L (ref 3.5–5.1)
Sodium: 136 mmol/L (ref 135–145)

## 2024-03-02 LAB — CBC WITH DIFFERENTIAL/PLATELET
Abs Immature Granulocytes: 0.11 10*3/uL — ABNORMAL HIGH (ref 0.00–0.07)
Basophils Absolute: 0.1 10*3/uL (ref 0.0–0.1)
Basophils Relative: 0 %
Eosinophils Absolute: 0.5 10*3/uL (ref 0.0–0.5)
Eosinophils Relative: 3 %
HCT: 30.8 % — ABNORMAL LOW (ref 36.0–46.0)
Hemoglobin: 10 g/dL — ABNORMAL LOW (ref 12.0–15.0)
Immature Granulocytes: 1 %
Lymphocytes Relative: 10 %
Lymphs Abs: 1.8 10*3/uL (ref 0.7–4.0)
MCH: 27.6 pg (ref 26.0–34.0)
MCHC: 32.5 g/dL (ref 30.0–36.0)
MCV: 85.1 fL (ref 80.0–100.0)
Monocytes Absolute: 1.3 10*3/uL — ABNORMAL HIGH (ref 0.1–1.0)
Monocytes Relative: 8 %
Neutro Abs: 13.5 10*3/uL — ABNORMAL HIGH (ref 1.7–7.7)
Neutrophils Relative %: 78 %
Platelets: 423 10*3/uL — ABNORMAL HIGH (ref 150–400)
RBC: 3.62 MIL/uL — ABNORMAL LOW (ref 3.87–5.11)
RDW: 13.8 % (ref 11.5–15.5)
WBC: 17.3 10*3/uL — ABNORMAL HIGH (ref 4.0–10.5)
nRBC: 0 % (ref 0.0–0.2)

## 2024-03-02 LAB — LIPID PANEL
Cholesterol: 92 mg/dL (ref 0–200)
HDL: 34 mg/dL — ABNORMAL LOW (ref >40)
LDL Cholesterol: 24 mg/dL (ref 0–99)
Total CHOL/HDL Ratio: 2.7 ratio
Triglycerides: 171 mg/dL — ABNORMAL HIGH (ref <150)
VLDL: 34 mg/dL (ref 0–40)

## 2024-03-02 MED ORDER — HYDROCODONE-ACETAMINOPHEN 5-325 MG PO TABS
1.0000 | ORAL_TABLET | ORAL | Status: DC | PRN
  Administered 2024-03-03: 2
  Filled 2024-03-02 (×2): qty 2

## 2024-03-02 MED ORDER — ROSUVASTATIN CALCIUM 20 MG PO TABS
40.0000 mg | ORAL_TABLET | Freq: Every day | ORAL | Status: DC
  Administered 2024-03-03 – 2024-03-05 (×3): 40 mg
  Filled 2024-03-02 (×3): qty 2

## 2024-03-02 MED ORDER — DILTIAZEM HCL 30 MG PO TABS
30.0000 mg | ORAL_TABLET | Freq: Two times a day (BID) | ORAL | Status: DC
  Administered 2024-03-02 – 2024-03-05 (×7): 30 mg
  Filled 2024-03-02 (×7): qty 1

## 2024-03-02 MED ORDER — SODIUM CHLORIDE 0.9 % IV SOLN: INTRAVENOUS | Status: AC

## 2024-03-02 MED ORDER — FAMOTIDINE 40 MG/5ML PO SUSR
40.0000 mg | Freq: Every day | ORAL | Status: DC
  Administered 2024-03-02 – 2024-03-05 (×4): 40 mg
  Filled 2024-03-02 (×4): qty 5

## 2024-03-02 MED ORDER — ALPRAZOLAM 0.25 MG PO TABS: 0.2500 mg | ORAL_TABLET | Freq: Once | ORAL | Status: DC | PRN

## 2024-03-02 MED ORDER — ASPIRIN 81 MG PO TBEC: 81.0000 mg | DELAYED_RELEASE_TABLET | Freq: Every day | ORAL | Status: DC

## 2024-03-02 MED ORDER — STROKE: EARLY STAGES OF RECOVERY BOOK
Freq: Once | Status: AC
  Filled 2024-03-02: qty 1

## 2024-03-02 MED ORDER — IOHEXOL 350 MG/ML SOLN
100.0000 mL | Freq: Once | INTRAVENOUS | Status: AC | PRN
  Administered 2024-03-02: 100 mL via INTRAVENOUS

## 2024-03-02 MED ORDER — ADULT MULTIVITAMIN W/MINERALS CH
1.0000 | ORAL_TABLET | Freq: Every day | ORAL | Status: DC
  Administered 2024-03-02 – 2024-03-05 (×4): 1
  Filled 2024-03-02 (×4): qty 1

## 2024-03-02 MED ORDER — SALINE SPRAY 0.65 % NA SOLN
1.0000 | NASAL | Status: DC | PRN
  Administered 2024-03-02: 1 via NASAL
  Filled 2024-03-02: qty 44

## 2024-03-02 MED ORDER — ONDANSETRON HCL 4 MG/2ML IJ SOLN
4.0000 mg | Freq: Four times a day (QID) | INTRAMUSCULAR | Status: DC | PRN
  Administered 2024-03-03 – 2024-03-04 (×2): 4 mg via INTRAVENOUS
  Filled 2024-03-02 (×2): qty 2

## 2024-03-02 MED ORDER — CLOPIDOGREL BISULFATE 75 MG PO TABS
75.0000 mg | ORAL_TABLET | Freq: Every day | ORAL | Status: DC
  Administered 2024-03-02 – 2024-03-13 (×12): 75 mg via ORAL
  Filled 2024-03-02 (×12): qty 1

## 2024-03-02 MED ORDER — ALPRAZOLAM 0.25 MG PO TABS
0.2500 mg | ORAL_TABLET | Freq: Once | ORAL | Status: AC | PRN
  Administered 2024-03-02: 0.25 mg
  Filled 2024-03-02: qty 1

## 2024-03-02 MED ORDER — POLYETHYLENE GLYCOL 3350 17 G PO PACK
17.0000 g | PACK | Freq: Every day | ORAL | Status: DC
  Administered 2024-03-03 – 2024-03-04 (×2): 17 g
  Filled 2024-03-02 (×2): qty 1

## 2024-03-02 MED ORDER — ASPIRIN 81 MG PO CHEW
81.0000 mg | CHEWABLE_TABLET | Freq: Every day | ORAL | Status: DC
  Administered 2024-03-02 – 2024-03-05 (×4): 81 mg
  Filled 2024-03-02 (×4): qty 1

## 2024-03-02 MED ORDER — ADULT MULTIVITAMIN W/MINERALS CH: 1.0000 | ORAL_TABLET | Freq: Every day | ORAL | Status: DC

## 2024-03-02 MED ORDER — ACETAMINOPHEN 325 MG PO TABS
650.0000 mg | ORAL_TABLET | Freq: Four times a day (QID) | ORAL | Status: DC | PRN
  Administered 2024-03-02: 650 mg
  Filled 2024-03-02: qty 2

## 2024-03-02 MED ORDER — ONDANSETRON HCL 4 MG PO TABS: 4.0000 mg | ORAL_TABLET | Freq: Four times a day (QID) | ORAL | Status: DC | PRN

## 2024-03-02 MED ORDER — ACETAMINOPHEN 650 MG RE SUPP: 650.0000 mg | Freq: Four times a day (QID) | RECTAL | Status: DC | PRN

## 2024-03-02 NOTE — Progress Notes (Signed)
 Subjective: Code stroke event from last night noted.  The patient is otherwise resting comfortably in bed.  Breathing through her trach tube, on trach collar.  Complaining of left leg weakness.  Objective: Vital signs in last 24 hours: Temp:  [97.7 F (36.5 C)-98.6 F (37 C)] 98 F (36.7 C) (06/13 0745) Pulse Rate:  [78-96] 84 (06/13 0807) Resp:  [15-27] 16 (06/13 0807) BP: (144-175)/(45-118) 175/60 (06/13 0745) SpO2:  [97 %-100 %] 100 % (06/13 0807) FiO2 (%):  [28 %-30 %] 28 % (06/13 0807)  Exam: Eyes: Pupils are equal, round, reactive to light. Extraocular motion is intact.  Ears: Examination of the ears shows normal auricles and external auditory canals bilaterally. Nose: Nasal examination shows normal mucosa, septum, turbinates.  An NG tube is in place. Face: Facial examination shows no asymmetry. Palpation of the face elicit no significant tenderness.  Mouth: Oral cavity examination shows no mucosal abnormalities.  Neck: Palpation of the neck reveals a large firm and fixed mass within the right thyroid  lobe.  The tracheostomy tube is in place and patent.  No bleeding is noted.  Recent Labs    02/29/24 0534 03/01/24 0400  WBC 8.4 16.0*  HGB 8.6* 9.5*  HCT 26.0* 29.3*  PLT 295 350   Recent Labs    02/29/24 0534 03/01/24 0400  NA 139 136  K 3.3* 4.1  CL 107 104  CO2 25 22  GLUCOSE 102* 227*  BUN 7* 21  CREATININE 0.77 0.78  CALCIUM  8.9 8.9    Medications: I have reviewed the patient's current medications. Scheduled:  [START ON 03/03/2024]  stroke: early stages of recovery book   Does not apply Once   diltiazem   30 mg Oral BID   feeding supplement (PROSource TF20)  60 mL Per Tube Daily   free water   100 mL Per Tube Q4H   heparin   5,000 Units Subcutaneous Q8H   insulin  aspart  0-15 Units Subcutaneous Q4H   insulin  glargine-yfgn  8 Units Subcutaneous QHS   latanoprost   1 drop Both Eyes QHS   pantoprazole   40 mg Oral Daily   polyethylene glycol  17 g Oral Daily    rosuvastatin   40 mg Oral Daily   timolol   1 drop Both Eyes BID   Continuous:  sodium chloride  100 mL/hr at 03/02/24 0249   feeding supplement (OSMOLITE 1.5 CAL) 25 mL/hr at 03/01/24 0600    Assessment/Plan: POD #2 s/p tracheostomy. 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 - Acute stroke last night.  The family and neurology have decided to proceed with no intervention. - The patient is breathing well via her trach collar. - Will change her cuffed tracheostomy tube to an uncuffed tube this weekend. - Awaiting pathology result. - On tube feed.   LOS: 6 days   Temiloluwa [place] W Constance Hackenberg 03/02/2024, 8:24 AM

## 2024-03-02 NOTE — Progress Notes (Signed)
 Pt alert and oriented x4, on 6L via trach, NAD. Pt alerted this RN that she was having some weakness in her L leg, at 2330 on 6/12, this RN reached out to the provider, [facility], MD at [phone] and provider was en route, upon the provider's bedside assessment with this RN present, code stroke was called and rapid nurse, Bridgette Campus, RN and neurology provider, Bonnita Buttner, MD arrived at the bedside and orders were placed.

## 2024-03-02 NOTE — Progress Notes (Signed)
   03/02/24 1000  Spiritual Encounters  Type of Visit Initial  Care provided to: Pt and family  Referral source Family  Reason for visit Routine spiritual support  OnCall Visit No  Interventions  Spiritual Care Interventions Made Established relationship of care and support;Compassionate presence;Reflective listening;Prayer;Normalization of emotions  Intervention Outcomes  Outcomes Connection to spiritual care;Awareness of support  Spiritual Care Plan  Spiritual Care Issues Still Outstanding No further spiritual care needs at this time (see row info)   Chaplain responded to request from son to anoint and pray for the patient.  Patient confirmed these wishes and Chaplain complied.  Patient and family expressed their gratitude in [place] providing spiritual support through presence, empathy and compassion.    Chaplain services remain available as the need arises by Spiritual Consult or for emergent cases, paging [phone]  [facility], LPN, [facility], ORDM

## 2024-03-02 NOTE — Code Documentation (Signed)
 NIH Stroke Scale   Interval: initial Time: 00:50 Person Administering Scale: Rojelio Clement  Called by Dr. [facility] Lavender for evaluation of patient with left lower extremity weakness at 0032. Dr. Arora also notifed and at bedside. Code stroke paged put at 0042. LKW unknown, seems to be around 5:00pm. Patient is alert and oriented x4, follows all commands. NIH listed below. Complete left hemianopia which is baseline and right partial upper hemianopia, left arm drift, and left leg no effort against gravity. CT, CTA, CTP obtained. CT head unremarkable. CTA and CTP should possible progressive severe stenosis and ICAD. Long detailed discussion with patient, patients husband, patients son, Dr. Bonnita Buttner and Dr. Johnna Nakai. Decision was made to not proceed with any interventions.   1a  Level of consciousness: 0=alert; keenly responsive  1b. LOC questions:  0=Performs both tasks correctly  1c. LOC commands: 0=Performs both tasks correctly  2.  Best Gaze: 0=normal  3.  Visual: 2=Complete hemianopia  4. Facial Palsy: 0=Normal symmetric movement  5a.  Motor left arm: 1=Drift, limb holds 90 (or 45) degrees but drifts down before full 10 seconds: does not hit bed  5b.  Motor right arm: 0=No drift, limb holds 90 (or 45) degrees for full 10 seconds  6a. motor left leg: 3=No effort against gravity, limb falls  6b  Motor right leg:  0=No drift, limb holds 90 (or 45) degrees for full 10 seconds  7. Limb Ataxia: 0=Absent  8.  Sensory: 0=Normal; no sensory loss  9. Best Language:  0=No aphasia, normal  10. Dysarthria: 0=Normal  11. Extinction and Inattention: 0=No abnormality       Total:   6

## 2024-03-02 NOTE — Progress Notes (Addendum)
 PROGRESS NOTE    Nancy Sutton  AOZ:[id number] DOB: [birth year] DOA: 02/25/2024 PCP: [name], MD  Chief Complaint  Patient presents with   having to clear throat   Neck Pain   thyroid  mass    Brief Narrative:   78 y.o. female,  with history of stroke with residual left hemianopsia, hypertension and type 2 diabetes, history of CAD with NSTEMI March 2024 status post stent placement, patient has been recently followed by Dr. Abraham Abo on 6/3 for surgery workup for Nancy Sutton thyroid  mass, has been cleared by cardiology 6/4 to undergo surgery, patient presents to ED as she has been having difficulty clearing her throat, and worsening neck pain, transferred from [facility] to [facility] for evaluation by general surgery, upon [facility] evaluation/10, he recommended ENT involvement, patient was seen by Dr. Bobetta Burrows with plan for tracheostomy 6/11, and open thyroid  biopsy for definitive histologic diagnosis, medical oncology, radiation oncology has been involved as well, as well Dr. Bobetta Burrows in communication with ENT at [facility]   Assessment & Plan:   Principal Problem:   Neck mass Active Problems:   Hyperlipidemia   GERD (gastroesophageal reflux disease)   Cerebral infarction (HCC)   Uncontrolled type 1 diabetes mellitus with hyperglycemia (HCC)   NSTEMI (non-ST elevated myocardial infarction) (HCC)   Paroxysmal atrial fibrillation (HCC)   CAD (coronary artery disease)   Anaplastic thyroid  carcinoma (HCC)   Hoarseness   Thyroid  cancer (HCC)   Tracheostomy status (HCC)  Acute Stroke MRI pending CTA head/neck notable for abrupt cut off with functional occlusion of the cavernous right ICA at the level of the anterior genu.  Distal reconstitution via colateral flow eith via the ophthalmic artery or cross the circle of willis.  Favored to reflect severe high grade progressive stenosis (see report).  43 ml area delayed prefusion involving R cerebral hemisphere, somewhat watershed in distribution.   Atheromatous change about the L carotid siphon with resultant moderate to severe stenosis at the para clinoid region.  Diffusely irregular and attenuated R PCA.  Echo with normal EF (see report)  Appreciate neurology assistance, related to severe stenosis or near occlusion of R ICA.   A1c 12/2023 6.9, LDL 24 Ok per ENT to start DAPT.  Crestor .   PT/OT/SLP - currently NPO   Right anterior neck/thyroid  mass concerning for thyroid  malignancy. - Rapidly enlarging right thyroid  malignancy, concerning for aggressive thyroid  cancer, likely anaplastic - now s/p open thyroid  biopsy and tracheostomy -> plan to transition to uncuffed tube this wknd - surgical pathology pending from 6/11 - CT chest abdomen pelvis without metastatic disease visualized - CT neck with irregular peripherally enhancing centrally hypodense lesion within the right thyroid  fossa with associated mild right cervical lymphadenopathy.  LN's centrally hypodense (suppurative or necrotic) (concerning for metastatic thyroid  cancer - see report) - With further recommendations after her surgery today and definitive histologic diagnosis, I will await further recommendation from radiation oncology and oncology pending biopsy results, possible need to transfer to [facility] for radiation and chemotherapy. - cortrak (NG switched to cortrak today), getting tube feeds - SLP to evaluate    History of stroke with left-sided hemianopsia.  - Continue with aspirin  and statin - Plavix  on hold due to above.   CAD s/p PCI in March 2024.  - Has seen her cardiologist and was cleared to undergo surgical procedure on 02/22/2024, Plavix  on hold continue statin.     History of 1 episode of Ilaria Much-fib in 2024.  - This  was in conjunction of her NSTEMI.  Not on anticoagulation, follows with her cardiologist, continue low-dose Cardizem .   Dyslipidemia.  Continue combination of statin and Zetia.   Hypomagnesemia.  Replaced.     Hypokalemia.  Placed    Essential hypertension.  On Cardizem .   DM type II  A1c 12/2023 6.9  Lantus  8 units, SSI - adjust as needed  Glaucoma Timolol      DVT prophylaxis: heparin   Code Status: full Family Communication: son, daughter, husband, pastor at bedside Disposition:   Status is: Inpatient Remains inpatient appropriate because: need for inpatient care   Consultants:  ENT General Surgery  Procedures:  6/11 PROCEDURE PERFORMED:   Tracheostomy Open biopsy of the right thyroid  cancer  Echo IMPRESSIONS     1. Left ventricular ejection fraction, by estimation, is 55 to 60%. The  left ventricle has normal function. The left ventricle has no regional  wall motion abnormalities. Left ventricular diastolic parameters were  normal.   2. Right ventricular systolic function is normal. The right ventricular  size is normal. Tricuspid regurgitation signal is inadequate for assessing  PA pressure.   3. The mitral valve is normal in structure. Trivial mitral valve  regurgitation.   4. The aortic valve was not well visualized. Aortic valve regurgitation  is not visualized. No aortic stenosis is present.   5. The inferior vena cava is dilated in size with >50% respiratory  variability, suggesting right atrial pressure of 8 mmHg.   Antimicrobials:  Anti-infectives (From admission, onward)    Start     Dose/Rate Route Frequency Ordered Stop   02/29/24 1000  clindamycin  (CLEOCIN ) IVPB 900 mg        900 mg 100 mL/hr over 30 Minutes Intravenous  Once 02/29/24 0955 02/29/24 1106   02/29/24 0957  clindamycin  (CLEOCIN ) 900 MG/50ML IVPB       Note to Pharmacy: Clydell Darnel E: cabinet override      02/29/24 0957 02/29/24 1111       Subjective: No new complaints Family at bedside   Objective: Vitals:   03/02/24 1100 03/02/24 1351 03/02/24 1410 03/02/24 1429  BP:  (!) 174/56    Pulse: 82 86 85 86  Resp: 18 18  18   Temp:  98.5 F (36.9 C)    TempSrc:  Oral    SpO2: 98% 98% 96%   Weight:    53.7 kg   Height:   5' (1.524 m)     Intake/Output Summary (Last 24 hours) at 03/02/2024 1630 Last data filed at 03/02/2024 1010 Gross per 24 hour  Intake --  Output 1650 ml  Net -1650 ml   Filed Weights   02/29/24 0943 03/01/24 0500 03/02/24 1410  Weight: 55.1 kg 55.6 kg 53.7 kg    Examination:  General: No acute distress. Cardiovascular: RRR Lungs: trach - secretions  Abdomen: Soft, nontender, nondistended  Neurological: Awake, but Betsabe Iglesia bit sleepy.  L sided weakness.  Extremities: No clubbing or cyanosis. No edema.  Data Reviewed: I have personally reviewed following labs and imaging studies  CBC: Recent Labs  Lab 02/27/24 0315 02/28/24 0513 02/29/24 0534 03/01/24 0400 03/02/24 0836  WBC 11.5* 11.6* 8.4 16.0* 17.3*  NEUTROABS 8.0* 8.5* 5.8 14.0* 13.5*  HGB 9.1* 9.4* 8.6* 9.5* 10.0*  HCT 28.0* 28.7* 26.0* 29.3* 30.8*  MCV 86.2 85.9 85.2 86.2 85.1  PLT 282 295 295 350 423*    Basic Metabolic Panel: Recent Labs  Lab 02/26/24 0548 02/27/24 0315 02/28/24 0513 02/29/24 0534 03/01/24  0400 03/02/24 0836  NA 135 137 138 139 136 136  K 4.0 3.8 3.6 3.3* 4.1 4.0  CL 103 105 104 107 104 101  CO2 25 25 24 25 22 25   GLUCOSE 127* 115* 126* 102* 227* 137*  BUN 13 9 8  7* 21 18  CREATININE 0.79 0.93 0.78 0.77 0.78 0.73  CALCIUM  8.7* 9.0 8.6* 8.9 8.9 8.9  MG 1.6* 1.6* 2.1 1.8 2.1  --   PHOS  --  2.5 2.9 3.3 3.7  --     GFR: Estimated Creatinine Clearance: 41.6 mL/min (by C-G formula based on SCr of 0.73 mg/dL).  Liver Function Tests: No results for input(s): AST, ALT, ALKPHOS, BILITOT, PROT, ALBUMIN in the last 168 hours.  CBG: Recent Labs  Lab 03/01/24 2336 03/02/24 0519 03/02/24 0929 03/02/24 1221 03/02/24 1532  GLUCAP 91 169* 138* 210* 209*     No results found for this or any previous visit (from the past 240 hours).       Radiology Studies: ECHOCARDIOGRAM COMPLETE Result Date: 03/02/2024    ECHOCARDIOGRAM REPORT   Patient Name:    RENARDA MULLINIX Date of Exam: 03/02/2024 Medical Rec #:  [id number]      Height:       60.0 in Accession #:    [id number]     Weight:       122.6 lb Date of Birth:  06-20-1946      BSA:          1.516 m Patient Age:    78 years       BP:           175/60 mmHg Patient Gender: F              HR:           81 bpm. Exam Location:  Inpatient Procedure: 2D Echo, Color Doppler and Cardiac Doppler (Both Spectral and Color            Flow Doppler were utilized during procedure). Indications:    Stroke  History:        Patient has prior history of Echocardiogram examinations, most                 recent 12/09/2022. NSTEMI.  Sonographer:    Jeralene Mom Referring Phys: [id number] ARSHAD N KAKRAKANDY IMPRESSIONS  1. Left ventricular ejection fraction, by estimation, is 55 to 60%. The left ventricle has normal function. The left ventricle has no regional wall motion abnormalities. Left ventricular diastolic parameters were normal.  2. Right ventricular systolic function is normal. The right ventricular size is normal. Tricuspid regurgitation signal is inadequate for assessing PA pressure.  3. The mitral valve is normal in structure. Trivial mitral valve regurgitation.  4. The aortic valve was not well visualized. Aortic valve regurgitation is not visualized. No aortic stenosis is present.  5. The inferior vena cava is dilated in size with >50% respiratory variability, suggesting right atrial pressure of 8 mmHg. FINDINGS  Left Ventricle: Left ventricular ejection fraction, by estimation, is 55 to 60%. The left ventricle has normal function. The left ventricle has no regional wall motion abnormalities. The left ventricular internal cavity size was normal in size. There is  no left ventricular hypertrophy. Left ventricular diastolic parameters were normal. Right Ventricle: The right ventricular size is normal. No increase in right ventricular wall thickness. Right ventricular systolic function is normal. Tricuspid regurgitation signal  is inadequate for assessing PA pressure. Left Atrium: Left atrial size  was normal in size. Right Atrium: Right atrial size was normal in size. Pericardium: There is no evidence of pericardial effusion. Mitral Valve: The mitral valve is normal in structure. Trivial mitral valve regurgitation. Tricuspid Valve: The tricuspid valve is normal in structure. Tricuspid valve regurgitation is trivial. Aortic Valve: The aortic valve was not well visualized. Aortic valve regurgitation is not visualized. No aortic stenosis is present. Aortic valve mean gradient measures 4.0 mmHg. Aortic valve peak gradient measures 7.3 mmHg. Aortic valve area, by VTI measures 2.00 cm. Pulmonic Valve: The pulmonic valve was not well visualized. Pulmonic valve regurgitation is not visualized. Aorta: The aortic root is normal in size and structure. Venous: The inferior vena cava is dilated in size with greater than 50% respiratory variability, suggesting right atrial pressure of 8 mmHg. IAS/Shunts: The interatrial septum was not well visualized.  LEFT VENTRICLE PLAX 2D LVIDd:         3.97 cm   Diastology LVIDs:         3.15 cm   LV e' medial:    9.57 cm/s LV PW:         0.83 cm   LV E/e' medial:  10.6 LV IVS:        0.83 cm   LV e' lateral:   13.30 cm/s LVOT diam:     1.70 cm   LV E/e' lateral: 7.6 LV SV:         54 LV SV Index:   36 LVOT Area:     2.27 cm  RIGHT VENTRICLE RV Basal diam:  3.10 cm RV Mid diam:    2.80 cm RV S prime:     11.70 cm/s TAPSE (M-mode): 2.1 cm LEFT ATRIUM             Index        RIGHT ATRIUM           Index LA diam:        3.20 cm 2.11 cm/m   RA Area:     12.00 cm LA Vol (A2C):   43.5 ml 28.69 ml/m  RA Volume:   26.10 ml  17.22 ml/m LA Vol (A4C):   30.6 ml 20.19 ml/m LA Biplane Vol: 36.8 ml 24.28 ml/m  AORTIC VALVE AV Area (Vmax):    1.80 cm AV Area (Vmean):   1.87 cm AV Area (VTI):     2.00 cm AV Vmax:           135.00 cm/s AV Vmean:          89.800 cm/s AV VTI:            0.270 m AV Peak Grad:      7.3 mmHg  AV Mean Grad:      4.0 mmHg LVOT Vmax:         107.00 cm/s LVOT Vmean:        73.900 cm/s LVOT VTI:          0.238 m LVOT/AV VTI ratio: 0.88  AORTA Ao Root diam: 2.40 cm MITRAL VALVE MV Area (PHT): 4.31 cm     SHUNTS MV Decel Time: 176 msec     Systemic VTI:  0.24 m MV E velocity: 101.00 cm/s  Systemic Diam: 1.70 cm MV Kambria Grima velocity: 128.00 cm/s MV E/Xaniyah Buchholz ratio:  0.79 Carson Clara MD Electronically signed by Carson Clara MD Signature Date/Time: 03/02/2024/11:11:31 AM    Final    CT ANGIO HEAD NECK W WO CM W PERF (CODE STROKE)  Result Date: 03/02/2024 CLINICAL DATA:  Initial evaluation for acute neuro deficit. History of thyroid  cancer. EXAM: CT ANGIOGRAPHY HEAD AND NECK CT PERFUSION BRAIN TECHNIQUE: Multidetector CT imaging of the head and neck was performed using the standard protocol during bolus administration of intravenous contrast. Multiplanar CT image reconstructions and MIPs were obtained to evaluate the vascular anatomy. Carotid stenosis measurements (when applicable) are obtained utilizing NASCET criteria, using the distal internal carotid diameter as the denominator. Multiphase CT imaging of the brain was performed following IV bolus contrast injection. Subsequent parametric perfusion maps were calculated using RAPID software. RADIATION DOSE REDUCTION: This exam was performed according to the departmental dose-optimization program which includes automated exposure control, adjustment of the mA and/or kV according to patient size and/or use of iterative reconstruction technique. CONTRAST:  OMNIPAQUE  IOHEXOL  350 MG/ML SOLN COMPARISON:  Comparison made with CT from earlier the same day as well as prior neck CT from 02/28/2024. FINDINGS: CTA NECK FINDINGS Aortic arch: Visualized aortic arch within normal limits for caliber with standard 3 vessel morphology. Moderate aortic atherosclerosis. No significant stenosis about the origin the great vessels. Right carotid system: Right common and  internal carotid arteries are patent without dissection. Atheromatous change about the right carotid bulb without hemodynamically significant greater than 50% stenosis. Left carotid system: Left common and internal carotid arteries are patent without dissection. No hemodynamically significant stenosis about the left carotid artery system. Vertebral arteries: Both vertebral arteries arise from subclavian arteries. Vertebral arteries patent without stenosis or dissection. Skeleton: No worrisome osseous lesions. Mild-to-moderate multilevel spondylosis noted within the visualized cervicothoracic spine. Other neck: Nasogastric tube in place. Heterogeneous enhancing ill-defined mass positioned at the right thyroid  fossa again seen, measuring approximately 3.9 x 2.8 x 3.2 cm. Multiple abnormal and partially necrotic nodes within the adjacent left neck, concerning for nodal metastatic disease. Changes are relatively similar as compared to recent neck CT. Interval placement of Alaila Pillard tracheostomy with tip well positioned in the upper airway. Small volume secretions noted within subglottic trachea distal to the tracheostomy tube. Upper chest: No other acute finding. Review of the MIP images confirms the above findings CTA HEAD FINDINGS Anterior circulation: Atheromatous change seen about the carotid siphons bilaterally. On the right, there is abrupt cut off of the cavernous right ICA at the level of the anterior genu (series 10, images 103-95). The right ICA is functionally occluded at this level. Distal reconstitution via collateral flow either via the ophthalmic artery or cross the circle-of-Willis. Upon review of prior MRA from 2012, there appears to have been Aasiyah Auerbach moderate to severe stenosis at this location. Therefore, finding is favored to reflect Dallis Darden severe high-grade progressive stenosis. Acute superimposed intraluminal thrombus is difficult to exclude. On the left, atheromatous change about the left siphon with resultant  moderate to severe stenosis at the para clinoid region (series 10, image 92). A1 segments are diminutive but patent. Anterior communicating artery complex within normal limits. Both ACAs patent to their distal aspects without stenosis. Right ACA dominant. M1 segments patent without stenosis. No proximal SCA branch occlusion. Distal MCA branches perfused and fairly symmetric. Posterior circulation: Both V4 segments patent without stenosis. Left vertebral artery dominant. Both PICA patent. Basilar patent without stenosis. Superior cerebral arteries patent bilaterally. Left PCA primarily supplied via the basilar. Atheromatous change about the left PCA with moderate proximal left P2 stenosis (series 18, image 21). The right PCA is diffusely irregular and attenuated, in keeping with the chronic right PCA territory infarct. Right PCA remains patent to  its distal aspect. Venous sinuses: Patent allowing for timing the contrast bolus. Anatomic variants: As above. No visible aneurysm. Review of the MIP images confirms the above findings CT Brain Perfusion Findings: ASPECTS: 10 CBF (<30%) Volume: 0mL Perfusion (Tmax>6.0s) volume: 43mL Mismatch Volume: 43 mL Infarction Location:43 mL areas of delayed perfusion involving the right cerebral hemisphere, somewhat watershed distribution. Finding presumably related to functional occlusion/severe stenosis at the cavernous right ICA as detailed above. No acute core infarct. No other perfusion abnormality IMPRESSION: 1. 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 Distal reconstitution via collateral flow either via the ophthalmic artery or cross the circle-of-Willis. Upon review of prior MRA from 2012, there appears to have been Marline Morace moderate to severe stenosis at this location. Therefore, this finding is favored to reflect Caiden Monsivais severe high-grade progressive stenosis although acutely superimposed intraluminal thrombus is difficult to  exclude. 2. 43 mL areas of delayed perfusion involving the right cerebral hemisphere, somewhat watershed and distribution. Finding is in keeping with the above described high-grade stenosis/occlusion. No acute core infarct by CT perfusion. 3. 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 4. Diffusely irregular and attenuated right PCA, in keeping with the chronic right PCA territory infarct. 5. Heterogeneous enhancing mass at the right thyroid  fossa, presumably reflecting patient's known thyroid  carcinoma. Scattered partially necrotic lymph nodes within the adjacent right neck, concerning for nodal metastatic disease. Changes are relatively similar as compared to recent neck CT from 02/28/2024. 6. Interval placement of Cloteal Isaacson tracheostomy tube with tip well positioned in the upper airway. 7.  Aortic Atherosclerosis (ICD10-I70.0). Results discussed by telephone at the time of interpretation on 03/02/2024 at 1:35 Yelitza Reach.m. to provider ASHISH ARORA. Electronically Signed   By: Virgia Griffins M.D.   On: 03/02/2024 02:05   CT HEAD CODE STROKE WO CONTRAST Result Date: 03/02/2024 CLINICAL DATA:  Code stroke. Initial evaluation for acute neuro deficit, stroke suspected. EXAM: CT HEAD WITHOUT CONTRAST TECHNIQUE: Contiguous axial images were obtained from the base of the skull through the vertex without intravenous contrast. RADIATION DOSE REDUCTION: This exam was performed according to the departmental dose-optimization program which includes automated exposure control, adjustment of the mA and/or kV according to patient size and/or use of iterative reconstruction technique. COMPARISON:  None Available. FINDINGS: Brain: Cerebral volume within normal limits. Encephalomalacia involving the right occipital lobe, consistent with Maeve Debord chronic right PCA distribution infarct. No acute intracranial hemorrhage. No acute large vessel territory infarct. No mass  lesion or midline shift. No hydrocephalus or extra-axial fluid collection. Vascular: No abnormal hyperdense vessel. Calcified atherosclerosis present at the skull base. Skull: Scalp soft tissues within normal limits.  Calvarium intact. Sinuses/Orbits: Globes orbital soft tissues demonstrate no acute finding. Paranasal sinuses are largely clear. No significant mastoid effusion. Nasogastric tube in place. Other: None. ASPECTS [facility] Stroke Program Early CT Score) - Ganglionic level infarction (caudate, lentiform nuclei, internal capsule, insula, M1-M3 cortex): 7 - Supraganglionic infarction (M4-M6 cortex): 3 Total score (0-10 with 10 being normal): 10 IMPRESSION: 1. No acute intracranial abnormality. 2. ASPECTS is 10. 3. Chronic right PCA territory infarct. These results were communicated to Dr. Arora at 1:00 am on 03/02/2024 by text page via the [facility] messaging system. Electronically Signed   By: Virgia Griffins M.D.   On: 03/02/2024 01:01   DG Swallowing Func-Speech Pathology Result Date: 03/01/2024 Table formatting from the original result was not included. Modified  Barium Swallow Study Patient Details Name: LOETA HERST MRN: [id number] Date of Birth: June 30, 1946 Today's Date: 03/01/2024 HPI/PMH: HPI: 78 yo female presenting 6/7 with rapidly enlarging thyroid  mass. CT Neck showed 4.1 x 2.5 x 3.9 cm heterogeneous mass in the right anterior neck at  the level of the thyroid  concerning for residual/recurrent thyroid  malignancy. Associated mass effect and leftward shift of the airway without significant airway narrowing. 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 Associated retropharyngeal edema. Trach done by ENT 6/11. PMH includes: GERD, stroke, Barrett's esophagus with esophageal dysphagia s/p dilation, HLD, NSTEMI, PAF, CAD, HTN Clinical Impression: Clinical Impression: Pt's oral phase is functional but she  has pharyngeal dysphagia, largely characterized by inability to fully close off her airway. She has limited anterior hyoid movement, reduced laryngeal elevation, and with lighter boluses there is no epiglottic inversion (the weight of solid boluses help invert it Gerritt Galentine little bit, but it is still incomplete). Her laryngeal vestibule closure is incomplete, and liquids enter it during the swallow (or after from small amounts of residue, mostly caught in the valleculae). Aspiration occurs with thin liquids and is inconsistently sensed (PAS 7-8, usually Lezley Bedgood throat clear as opposed to Greyden Besecker reflexive cough). Zahraa Bhargava cued cough does not eject aspirates. Amyra Vantuyl few postures and strategies were attempted but aspiration still occurred with [place] Silveria chin tuck. She did not aspirate when turning her head to the R or when using Ixel Boehning supraglottic swallow, but she does still penetrate and she cannot clear penetrates. Pt has penetration that is more shallow with nectar (PAS 3) and honey thick liquids (PAS 2). Even though there is Victorina Kable little frank penetration with nectar thick liquids, almost all of it completely clears. Considering efficiency and safety with swallowing, recommend starting with Dys 2 diet and nectar thick liquids with PMV in place for all PO intake. SLP will f/u for potential implementation of water  protocol with use of strategies. Factors that may increase risk of adverse event in presence of aspiration Roderick Civatte & Jessy Morocco 2021): Factors that may increase risk of adverse event in presence of aspiration Roderick Civatte & Jessy Morocco 2021): Weak cough; Presence of tubes (ETT, trach, NG, etc.) Recommendations/Plan: Swallowing Evaluation Recommendations Swallowing Evaluation Recommendations Recommendations: PO diet PO Diet Recommendation: Dysphagia 2 (Finely chopped); Mildly thick liquids (Level 2, nectar thick) Liquid Administration via: Cup; Straw Medication Administration: Crushed with puree Supervision: Patient able to self-feed; Intermittent supervision/cueing  for swallowing strategies Swallowing strategies  : Place PMSV during PO intake; Slow rate; Small bites/sips Postural changes: Position pt fully upright for meals Oral care recommendations: Oral care BID (2x/day) Caregiver Recommendations: Avoid jello, ice cream, thin soups, popsicles; Remove water  pitcher; Have oral suction available Treatment Plan Treatment Plan Follow-up recommendations: Outpatient SLP Functional status assessment: Patient has had Bertin Inabinet recent decline in their functional status and demonstrates the ability to make significant improvements in function in Ashante Yellin reasonable and predictable amount of time. Treatment frequency: Min 2x/week Treatment duration: 2 weeks Interventions: Aspiration precaution training; Compensatory techniques; Patient/family education; Trials of upgraded texture/liquids; Diet toleration management by SLP; Respiratory muscle strength training Recommendations Recommendations for follow up therapy are one component of Muna Demers multi-disciplinary discharge planning process, led by the attending physician.  Recommendations may be updated based on patient status, additional functional criteria and insurance authorization. Assessment: Orofacial Exam: Orofacial Exam Oral Cavity: Oral Hygiene: WFL Oral Cavity - Dentition: Adequate natural dentition Orofacial Anatomy: WFL Oral Motor/Sensory Function: WFL Anatomy: Anatomy: Suspected  cervical osteophytes Boluses Administered: Boluses Administered Boluses Administered: Thin liquids (Level 0); Mildly thick liquids (Level 2, nectar thick); Moderately thick liquids (Level 3, honey thick); Puree; Solid  Oral Impairment Domain: Oral Impairment Domain Lip Closure: No labial escape Tongue control during bolus hold: Cohesive bolus between tongue to palatal seal Bolus preparation/mastication: Timely and efficient chewing and mashing Bolus transport/lingual motion: Brisk tongue motion Oral residue: Complete oral clearance Location of oral residue : N/Elnoria Livingston Initiation  of pharyngeal swallow : Valleculae  Pharyngeal Impairment Domain: Pharyngeal Impairment Domain Soft palate elevation: No bolus between soft palate (SP)/pharyngeal wall (PW) Laryngeal elevation: Partial superior movement of thyroid  cartilage/partial approximation of arytenoids to epiglottic petiole Anterior hyoid excursion: Partial anterior movement Epiglottic movement: No inversion Laryngeal vestibule closure: Incomplete, narrow column air/contrast in laryngeal vestibule Pharyngeal stripping wave : Present - complete Pharyngeal contraction (Mack Alvidrez/P view only): N/Carline Dura Pharyngoesophageal segment opening: Complete distension and complete duration, no obstruction of flow Tongue base retraction: Trace column of contrast or air between tongue base and PPW Pharyngeal residue: Collection of residue within or on pharyngeal structures Location of pharyngeal residue: Pyriform sinuses; Valleculae  Esophageal Impairment Domain: Esophageal Impairment Domain Esophageal clearance upright position: Esophageal retention with retrograde flow through the PES (x1 with thin liquisd during coughing) Pill: No data recorded Penetration/Aspiration Scale Score: Penetration/Aspiration Scale Score 1.  Material does not enter airway: Puree; Solid 2.  Material enters airway, remains ABOVE vocal cords then ejected out: Moderately thick liquids (Level 3, honey thick) 3.  Material enters airway, remains ABOVE vocal cords and not ejected out: Mildly thick liquids (Level 2, nectar thick) 8.  Material enters airway, passes BELOW cords without attempt by patient to eject out (silent aspiration) : Thin liquids (Level 0) Compensatory Strategies: Compensatory Strategies Compensatory strategies: Yes Chin tuck: Ineffective Ineffective Chin Tuck: Thin liquid (Level 0) (trace aspiration still occurred) Right head turn: Ineffective Ineffective Right Head Turn: Thin liquid (Level 0) (did not aspirate, but still penetrated) Supraglottic swallow: Ineffective Ineffective  Supraglottic Swallow: Thin liquid (Level 0) (did not aspirate, but still penetrated)   General Information: Caregiver present: Yes (son)  Diet Prior to this Study: NPO; Large bore NG tube   Temperature : Normal   Respiratory Status: WFL   Supplemental O2: Trach   History of Recent Intubation: Yes  Behavior/Cognition: Alert; Cooperative; Pleasant mood Self-Feeding Abilities: Able to self-feed Baseline vocal quality/speech: Normal Volitional Cough: Able to elicit Volitional Swallow: Able to elicit Exam Limitations: No limitations Goal Planning: Prognosis for improved oropharyngeal function: Good Barriers to Reach Goals: Overall medical prognosis (will need to see what her POC may be related to thyroid  mass) No data recorded Patient/Family Stated Goal: none stated Consulted and agree with results and recommendations: Patient; Family member/caregiver; Nurse Pain: Pain Assessment Pain Assessment: Faces Faces Pain Scale: 0 End of Session: Start Time:SLP Start Time (ACUTE ONLY): 1346 Stop Time: SLP Stop Time (ACUTE ONLY): 1406 Time Calculation:SLP Time Calculation (min) (ACUTE ONLY): 20 min Charges: SLP Evaluations $ SLP Speech Visit: 1 Visit SLP Evaluations $BSS Swallow: 1 Procedure $MBS Swallow: 1 Procedure $ SLP Eval Voice Prosthetic Device: Procedure $$ Passy Muir Speaking Valve: yes SLP visit diagnosis: SLP Visit Diagnosis: Dysphagia, pharyngeal phase (R13.13) Past Medical History: Past Medical History: Diagnosis Date  Arthritis   CAD (coronary artery disease) 03/28/2023  NSTEMI 11/2022 s/p 3 x 16 mm DES to the [facility], 3 x 20 mm DES to the mRCA   - LHC 12/13/2022: RCA ostial 95, mid 90; LCx mid 70; LAD  mid 40, distal 70>> Med Rx for LAD, LCx   - TTE 12/09/2022: EF 50-55, no RWMA, mild concentric LVH, GR 2 DD, normal RVSF, mild MR, RAP 15  Diabetes mellitus   since 2008  Essential hypertension, benign 09/10/2015  GERD (gastroesophageal reflux disease)   Hypercholesteremia   Hyperlipidemia   Stroke (HCC) 03/21/2011  only  deficits-affected eyesight Past Surgical History: Past Surgical History: Procedure Laterality Date  BIOPSY  07/02/2022  Procedure: BIOPSY;  Surgeon: Urban Garden, MD;  Location: [facility];  Service: Gastroenterology;;  CATARACT EXTRACTION W/PHACO  11/01/2011  Procedure: CATARACT EXTRACTION PHACO AND INTRAOCULAR LENS PLACEMENT (IOC);  Surgeon: Anner Kill, MD;  Location: [facility];  Service: Ophthalmology;  Laterality: Right;  CDE 12.48  CORONARY STENT INTERVENTION N/Londyn Hotard 12/10/2022  Procedure: CORONARY STENT INTERVENTION;  Surgeon: Arnoldo Lapping, MD;  Location: [facility] INVASIVE CV LAB;  Service: Cardiovascular;  Laterality: N/Rashay Barnette;  ESOPHAGEAL DILATION N/Chyane Greer 04/29/2017  Procedure: ESOPHAGEAL DILATION;  Surgeon: Ruby Corporal, MD;  [facility]: [facility];  Service: Endoscopy;  Laterality: N/Fedra Lanter;  ESOPHAGOGASTRODUODENOSCOPY N/Sebastyan Snodgrass 04/29/2017  Procedure: ESOPHAGOGASTRODUODENOSCOPY (EGD);  Surgeon: Ruby Corporal, MD;  [facility]: [facility];  Service: Endoscopy;  Laterality: N/Tikisha Molinaro;  1015  ESOPHAGOGASTRODUODENOSCOPY (EGD) WITH ESOPHAGEAL DILATION N/Elmo Shumard 02/14/2013  Procedure: ESOPHAGOGASTRODUODENOSCOPY (EGD) WITH ESOPHAGEAL DILATION;  Surgeon: Ruby Corporal, MD;  [facility]: [facility];  Service: Endoscopy;  Laterality: N/Yachet Mattson;  200-moved to 255 Ann to notify pt  ESOPHAGOGASTRODUODENOSCOPY (EGD) WITH PROPOFOL  N/Amiera Herzberg 05/28/2022  Procedure: ESOPHAGOGASTRODUODENOSCOPY (EGD) WITH PROPOFOL ;  Surgeon: Urban Garden, MD;  [facility]: [facility];  Service: Gastroenterology;  Laterality: N/Nichalas Coin;  1030 ASA 2  ESOPHAGOGASTRODUODENOSCOPY (EGD) WITH PROPOFOL  N/Ceanna Wareing 07/02/2022  Procedure: ESOPHAGOGASTRODUODENOSCOPY (EGD) WITH PROPOFOL ;  Surgeon: Urban Garden, MD;  [facility]: [facility];  Service: Gastroenterology;  Laterality: N/Abbygael Curtiss;  105 ASA 2, pt knows new time per Hugo Maes  EYE SURGERY  10-2010  left eye removal of cataract  LEFT HEART CATH AND CORONARY ANGIOGRAPHY N/Amen Staszak 12/10/2022  Procedure: LEFT HEART CATH AND CORONARY  ANGIOGRAPHY;  Surgeon: Arnoldo Lapping, MD;  Location: [facility] INVASIVE CV LAB;  Service: Cardiovascular;  Laterality: N/Ishani Goldwasser;  MASS BIOPSY N/Cataleah Stites 02/29/2024  Procedure: BIOPSY, THYROID ;  Surgeon: Reynold Caves, MD;  Location: [facility] OR;  Service: ENT;  Laterality: N/Jani Moronta;  TRACHEOSTOMY TUBE PLACEMENT N/Yulianna Folse 02/29/2024  Procedure: CREATION, TRACHEOSTOMY;  Surgeon: Reynold Caves, MD;  Location: [facility] OR;  Service: ENT;  Laterality: N/Leilan Bochenek;  WITH THYROID  BIOPSY  TUBAL LIGATION  1984 Beth Brooke., M.Michele Judy. CCC-SLP Acute Rehabilitation Services Office: [phone] Secure chat preferred 03/01/2024, 3:23 PM       Scheduled Meds:  [START ON 03/03/2024]  stroke: early stages of recovery book   Does not apply Once   diltiazem   30 mg Per Tube BID   famotidine   40 mg Per Tube Daily   feeding supplement (PROSource TF20)  60 mL Per Tube Daily   free water   100 mL Per Tube Q4H   heparin   5,000 Units Subcutaneous Q8H   insulin  aspart  0-15 Units Subcutaneous Q4H   insulin  glargine-yfgn  8 Units Subcutaneous QHS   latanoprost   1 drop Both Eyes QHS   multivitamin with minerals  1 tablet Per Tube Daily   [START ON 03/03/2024] polyethylene glycol  17 g Per Tube Daily   [START ON 03/03/2024] rosuvastatin   40 mg Per Tube Daily   timolol   1 drop Both Eyes BID   Continuous Infusions:  sodium chloride  Stopped (03/02/24 1545)   feeding  supplement (OSMOLITE 1.5 CAL) 1,000 mL (03/02/24 1539)     LOS: 6 days    Time spent: over 30 min    Donnetta Gains, MD [facility]   To contact the attending provider between 7A-7P or the covering provider during after hours 7P-7A, please log into the web site [email or web address] and access using universal [place] password for that web site. If you do not have the password, please call the hospital operator.  03/02/2024, 4:30 PM

## 2024-03-02 NOTE — Progress Notes (Signed)
 Speech Language Pathology Treatment: Dysphagia;Passy Muir Speaking valve  Patient Details Name: Nancy Sutton MRN: [id number] DOB: May 23, 1946 Today's Date: 03/02/2024 Time: 4098-1191 SLP Time Calculation (min) (ACUTE ONLY): 11 min  Assessment / Plan / Recommendation Clinical Impression  Pt was seen this afternoon, more alert than she has been so far today per family but still fatiguing by the end of the session. PMV was placed with increased wet vocal quality compared to previous date, as well as intermittent voice breaks especially when first placed. Family describes an increase in secretions today. Throughout the session, pt would cough when cued to do so, but it was not productive as it had been on previous date and her voice still remained wet.   Pt and her family report difficulty swallowing dinner last night, reporting difficulty getting thickened liquids and foods (Dys 2 carrots and mashed potatoes) to go down. They believe that acute neuro symptoms may have started around this timeframe, so question if she had any changes then that could have impacted her difficulties with dinner last night. On MBS, she had the best efficiency and safety with purees, and she even had trouble with that during her meal. Cranial nerve exam appears to be grossly similar, but as noted before, her cough seems to be weaker. When given small quantities of ice chips, there are more signs of dysphagia than were observed yesterday, including multiple swallows, increasingly wet vocal quality, and throat clearing with intermittent cough.   Pt seems appropriate to continue to wear her PMV during waking hours, but encouraged family to watch closely given increased secretions today. Daughter was also trained to don/doff valve. I do think repeating her MBS would be warranted prior to returning to a PO diet given dysphagia observed on initial study now with additional signs of dysphagia clinically. Will complete testing as soon  as it can be scheduled with radiology. For now, would offer 2-3 pieces of ice after oral care and while PMV is in place. The purpose of ice for this pt is not only comfort, but to keep pharynx healthy and moist. Pooled secretions in an NPO pt can thicken and create harmful persistent secretions that pt can't mobilize. These can even form mucus plugs. A few ice chips, given after oral care, can loosen secretions for pt to clear and maintain airway hygiene.      HPI HPI: 78 yo female presenting 6/7 with rapidly enlarging thyroid  mass. CT Neck showed 4.1 x 2.5 x 3.9 cm heterogeneous mass in the right anterior neck at  the level of the thyroid  concerning for residual/recurrent thyroid  malignancy. Associated mass effect and leftward shift of the airway without significant airway narrowing. 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 Associated retropharyngeal edema. Trach done by ENT 6/11. Code stroke was called 6/13 due to L sided weakness although time of onset likely the day prior. MRI pending. PMH includes: GERD, stroke, Barrett's esophagus with esophageal dysphagia s/p dilation, HLD, NSTEMI, PAF, CAD, HTN      SLP Plan  MBS          Recommendations  Diet recommendations: NPO;Other(comment) (may have 2-3 pieces of ice after oral care) Medication Administration: Via alternative means      Patient may use Passy-Muir Speech Valve: During all waking hours (remove during sleep);Caregiver trained to provide supervision PMSV Supervision: Full MD: Please consider changing trach tube to : Cuffless;Smaller size  Oral care QID;Oral care prior to ice chip/H20     Dysphagia, pharyngeal phase (R13.13);[place] (R49.1)     MBS     Beth Brooke., M.A. CCC-SLP Acute Rehabilitation Services Office: [phone]  Secure chat preferred   03/02/2024, 3:27 PM

## 2024-03-02 NOTE — Procedures (Signed)
 Cortrak  Person Inserting Tube:  Edwena Graham D, RD Tube Type:  Cortrak - 43 inches Tube Size:  10 Tube Location:  Right nare Initial Placement:  Stomach Secured by: Bridle Technique Used to Measure Tube Placement:  Marking at nare/corner of mouth Cortrak Secured At:  69 cm Procedure Comments:  Cortrak Tube Team Note:  Consult received to place a Cortrak feeding tube.   No x-ray is required. RN may begin using tube.   If the tube becomes dislodged please keep the tube and contact the Cortrak team at [email or web address] for replacement.  If after hours and replacement cannot be delayed, place a NG tube and confirm placement with an abdominal x-ray.    Edwena Graham, RD, LDN Registered Dietitian II Please reach out via secure chat

## 2024-03-02 NOTE — Progress Notes (Addendum)
 STROKE TEAM PROGRESS NOTE    INTERIM HISTORY/SUBJECTIVE Family at the bedside. Patient is drowsy in NAD. NO new neurological events overnight   OBJECTIVE  CBC    Component Value Date/Time   WBC 17.3 (H) 03/02/2024 0836   RBC 3.62 (L) 03/02/2024 0836   HGB 10.0 (L) 03/02/2024 0836   HCT 30.8 (L) 03/02/2024 0836   PLT 423 (H) 03/02/2024 0836   MCV 85.1 03/02/2024 0836   MCH 27.6 03/02/2024 0836   MCHC 32.5 03/02/2024 0836   RDW 13.8 03/02/2024 0836   LYMPHSABS 1.8 03/02/2024 0836   MONOABS 1.3 (H) 03/02/2024 0836   EOSABS 0.5 03/02/2024 0836   BASOSABS 0.1 03/02/2024 0836    BMET    Component Value Date/Time   NA 136 03/02/2024 0836   NA 143 03/29/2023 1140   K 4.0 03/02/2024 0836   CL 101 03/02/2024 0836   CO2 25 03/02/2024 0836   GLUCOSE 137 (H) 03/02/2024 0836   BUN 18 03/02/2024 0836   BUN 19 03/29/2023 1140   CREATININE 0.73 03/02/2024 0836   CREATININE 0.82 07/20/2019 1025   CALCIUM  8.9 03/02/2024 0836   EGFR 83.0 04/27/2023 0801   EGFR 69 03/29/2023 1140   GFRNONAA >60 03/02/2024 0836   GFRNONAA 71 07/20/2019 1025    IMAGING past 24 hours ECHOCARDIOGRAM COMPLETE Result Date: 03/02/2024    ECHOCARDIOGRAM REPORT   Patient Name:   Nancy Sutton Date of Exam: 03/02/2024 Medical Rec #:  [id number]      Height:       60.0 in Accession #:    [id number]     Weight:       122.6 lb Date of Birth:  Jun 26, 1946      BSA:          1.516 m Patient Age:    78 years       BP:           175/60 mmHg Patient Gender: F              HR:           81 bpm. Exam Location:  Inpatient Procedure: 2D Echo, Color Doppler and Cardiac Doppler (Both Spectral and Color            Flow Doppler were utilized during procedure). Indications:    Stroke  History:        Patient has prior history of Echocardiogram examinations, most                 recent 12/09/2022. NSTEMI.  Sonographer:    Jeralene Mom Referring Phys: [id number] ARSHAD N KAKRAKANDY IMPRESSIONS  1. Left ventricular ejection fraction, by  estimation, is 55 to 60%. The left ventricle has normal function. The left ventricle has no regional wall motion abnormalities. Left ventricular diastolic parameters were normal.  2. Right ventricular systolic function is normal. The right ventricular size is normal. Tricuspid regurgitation signal is inadequate for assessing PA pressure.  3. The mitral valve is normal in structure. Trivial mitral valve regurgitation.  4. The aortic valve was not well visualized. Aortic valve regurgitation is not visualized. No aortic stenosis is present.  5. The inferior vena cava is dilated in size with >50% respiratory variability, suggesting right atrial pressure of 8 mmHg. FINDINGS  Left Ventricle: Left ventricular ejection fraction, by estimation, is 55 to 60%. The left ventricle has normal function. The left ventricle has no regional wall motion abnormalities. The left ventricular internal cavity size was normal  in size. There is  no left ventricular hypertrophy. Left ventricular diastolic parameters were normal. Right Ventricle: The right ventricular size is normal. No increase in right ventricular wall thickness. Right ventricular systolic function is normal. Tricuspid regurgitation signal is inadequate for assessing PA pressure. Left Atrium: Left atrial size was normal in size. Right Atrium: Right atrial size was normal in size. Pericardium: There is no evidence of pericardial effusion. Mitral Valve: The mitral valve is normal in structure. Trivial mitral valve regurgitation. Tricuspid Valve: The tricuspid valve is normal in structure. Tricuspid valve regurgitation is trivial. Aortic Valve: The aortic valve was not well visualized. Aortic valve regurgitation is not visualized. No aortic stenosis is present. Aortic valve mean gradient measures 4.0 mmHg. Aortic valve peak gradient measures 7.3 mmHg. Aortic valve area, by VTI measures 2.00 cm. Pulmonic Valve: The pulmonic valve was not well visualized. Pulmonic valve  regurgitation is not visualized. Aorta: The aortic root is normal in size and structure. Venous: The inferior vena cava is dilated in size with greater than 50% respiratory variability, suggesting right atrial pressure of 8 mmHg. IAS/Shunts: The interatrial septum was not well visualized.  LEFT VENTRICLE PLAX 2D LVIDd:         3.97 cm   Diastology LVIDs:         3.15 cm   LV e' medial:    9.57 cm/s LV PW:         0.83 cm   LV E/e' medial:  10.6 LV IVS:        0.83 cm   LV e' lateral:   13.30 cm/s LVOT diam:     1.70 cm   LV E/e' lateral: 7.6 LV SV:         54 LV SV Index:   36 LVOT Area:     2.27 cm  RIGHT VENTRICLE RV Basal diam:  3.10 cm RV Mid diam:    2.80 cm RV S prime:     11.70 cm/s TAPSE (M-mode): 2.1 cm LEFT ATRIUM             Index        RIGHT ATRIUM           Index LA diam:        3.20 cm 2.11 cm/m   RA Area:     12.00 cm LA Vol (A2C):   43.5 ml 28.69 ml/m  RA Volume:   26.10 ml  17.22 ml/m LA Vol (A4C):   30.6 ml 20.19 ml/m LA Biplane Vol: 36.8 ml 24.28 ml/m  AORTIC VALVE AV Area (Vmax):    1.80 cm AV Area (Vmean):   1.87 cm AV Area (VTI):     2.00 cm AV Vmax:           135.00 cm/s AV Vmean:          89.800 cm/s AV VTI:            0.270 m AV Peak Grad:      7.3 mmHg AV Mean Grad:      4.0 mmHg LVOT Vmax:         107.00 cm/s LVOT Vmean:        73.900 cm/s LVOT VTI:          0.238 m LVOT/AV VTI ratio: 0.88  AORTA Ao Root diam: 2.40 cm MITRAL VALVE MV Area (PHT): 4.31 cm     SHUNTS MV Decel Time: 176 msec     Systemic VTI:  0.24 m MV  E velocity: 101.00 cm/s  Systemic Diam: 1.70 cm MV A velocity: 128.00 cm/s MV E/A ratio:  0.79 Carson Clara MD Electronically signed by Carson Clara MD Signature Date/Time: 03/02/2024/11:11:31 AM    Final    CT ANGIO HEAD NECK W WO CM W PERF (CODE STROKE) Result Date: 03/02/2024 CLINICAL DATA:  Initial evaluation for acute neuro deficit. History of thyroid  cancer. EXAM: CT ANGIOGRAPHY HEAD AND NECK CT PERFUSION BRAIN TECHNIQUE: Multidetector CT  imaging of the head and neck was performed using the standard protocol during bolus administration of intravenous contrast. Multiplanar CT image reconstructions and MIPs were obtained to evaluate the vascular anatomy. Carotid stenosis measurements (when applicable) are obtained utilizing NASCET criteria, using the distal internal carotid diameter as the denominator. Multiphase CT imaging of the brain was performed following IV bolus contrast injection. Subsequent parametric perfusion maps were calculated using RAPID software. RADIATION DOSE REDUCTION: This exam was performed according to the departmental dose-optimization program which includes automated exposure control, adjustment of the mA and/or kV according to patient size and/or use of iterative reconstruction technique. CONTRAST:  OMNIPAQUE  IOHEXOL  350 MG/ML SOLN COMPARISON:  Comparison made with CT from earlier the same day as well as prior neck CT from 02/28/2024. FINDINGS: CTA NECK FINDINGS Aortic arch: Visualized aortic arch within normal limits for caliber with standard 3 vessel morphology. Moderate aortic atherosclerosis. No significant stenosis about the origin the great vessels. Right carotid system: Right common and internal carotid arteries are patent without dissection. Atheromatous change about the right carotid bulb without hemodynamically significant greater than 50% stenosis. Left carotid system: Left common and internal carotid arteries are patent without dissection. No hemodynamically significant stenosis about the left carotid artery system. Vertebral arteries: Both vertebral arteries arise from subclavian arteries. Vertebral arteries patent without stenosis or dissection. Skeleton: No worrisome osseous lesions. Mild-to-moderate multilevel spondylosis noted within the visualized cervicothoracic spine. Other neck: Nasogastric tube in place. Heterogeneous enhancing ill-defined mass positioned at the right thyroid  fossa again seen,  measuring approximately 3.9 x 2.8 x 3.2 cm. Multiple abnormal and partially necrotic nodes within the adjacent left neck, concerning for nodal metastatic disease. Changes are relatively similar as compared to recent neck CT. Interval placement of a tracheostomy with tip well positioned in the upper airway. Small volume secretions noted within subglottic trachea distal to the tracheostomy tube. Upper chest: No other acute finding. Review of the MIP images confirms the above findings CTA HEAD FINDINGS Anterior circulation: Atheromatous change seen about the carotid siphons bilaterally. On the right, there is abrupt cut off of the cavernous right ICA at the level of the anterior genu (series 10, images 103-95). The right ICA is functionally occluded at this level. Distal reconstitution via collateral flow either via the ophthalmic artery or cross the circle-of-Willis. Upon review of prior MRA from 2012, there appears to have been a moderate to severe stenosis at this location. Therefore, finding is favored to reflect a severe high-grade progressive stenosis. Acute superimposed intraluminal thrombus is difficult to exclude. On the left, atheromatous change about the left siphon with resultant moderate to severe stenosis at the para clinoid region (series 10, image 92). A1 segments are diminutive but patent. Anterior communicating artery complex within normal limits. Both ACAs patent to their distal aspects without stenosis. Right ACA dominant. M1 segments patent without stenosis. No proximal SCA branch occlusion. Distal MCA branches perfused and fairly symmetric. Posterior circulation: Both V4 segments patent without stenosis. Left vertebral artery dominant. Both PICA patent. Basilar patent without stenosis.  Superior cerebral arteries patent bilaterally. Left PCA primarily supplied via the basilar. Atheromatous change about the left PCA with moderate proximal left P2 stenosis (series 18, image 21). The right PCA is  diffusely irregular and attenuated, in keeping with the chronic right PCA territory infarct. Right PCA remains patent to its distal aspect. Venous sinuses: Patent allowing for timing the contrast bolus. Anatomic variants: As above. No visible aneurysm. Review of the MIP images confirms the above findings CT Brain Perfusion Findings: ASPECTS: 10 CBF (<30%) Volume: 0mL Perfusion (Tmax>6.0s) volume: 43mL Mismatch Volume: 43 mL Infarction Location:43 mL areas of delayed perfusion involving the right cerebral hemisphere, somewhat watershed distribution. Finding presumably related to functional occlusion/severe stenosis at the cavernous right ICA as detailed above. No acute core infarct. No other perfusion abnormality IMPRESSION: 1. 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 Distal reconstitution via collateral flow either via the ophthalmic artery or cross the circle-of-Willis. Upon review of prior MRA from 2012, there appears to have been a moderate to severe stenosis at this location. Therefore, this finding is favored to reflect a severe high-grade progressive stenosis although acutely superimposed intraluminal thrombus is difficult to exclude. 2. 43 mL areas of delayed perfusion involving the right cerebral hemisphere, somewhat watershed and distribution. Finding is in keeping with the above described high-grade stenosis/occlusion. No acute core infarct by CT perfusion. 3. 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 4. Diffusely irregular and attenuated right PCA, in keeping with the chronic right PCA territory infarct. 5. Heterogeneous enhancing mass at the right thyroid  fossa, presumably reflecting patient's known thyroid  carcinoma. Scattered partially necrotic lymph nodes within the adjacent right neck, concerning for nodal metastatic disease. Changes are relatively similar as  compared to recent neck CT from 02/28/2024. 6. Interval placement of a tracheostomy tube with tip well positioned in the upper airway. 7.  Aortic Atherosclerosis (ICD10-I70.0). Results discussed by telephone at the time of interpretation on 03/02/2024 at 1:35 a.m. to provider ASHISH ARORA. Electronically Signed   By: Virgia Griffins M.D.   On: 03/02/2024 02:05   CT HEAD CODE STROKE WO CONTRAST Result Date: 03/02/2024 CLINICAL DATA:  Code stroke. Initial evaluation for acute neuro deficit, stroke suspected. EXAM: CT HEAD WITHOUT CONTRAST TECHNIQUE: Contiguous axial images were obtained from the base of the skull through the vertex without intravenous contrast. RADIATION DOSE REDUCTION: This exam was performed according to the departmental dose-optimization program which includes automated exposure control, adjustment of the mA and/or kV according to patient size and/or use of iterative reconstruction technique. COMPARISON:  None Available. FINDINGS: Brain: Cerebral volume within normal limits. Encephalomalacia involving the right occipital lobe, consistent with a chronic right PCA distribution infarct. No acute intracranial hemorrhage. No acute large vessel territory infarct. No mass lesion or midline shift. No hydrocephalus or extra-axial fluid collection. Vascular: No abnormal hyperdense vessel. Calcified atherosclerosis present at the skull base. Skull: Scalp soft tissues within normal limits.  Calvarium intact. Sinuses/Orbits: Globes orbital soft tissues demonstrate no acute finding. Paranasal sinuses are largely clear. No significant mastoid effusion. Nasogastric tube in place. Other: None. ASPECTS [facility] Stroke Program Early CT Score) - Ganglionic level infarction (caudate, lentiform nuclei, internal capsule, insula, M1-M3 cortex): 7 - Supraganglionic infarction (M4-M6 cortex): 3 Total score (0-10 with 10 being normal): 10 IMPRESSION: 1. No acute intracranial abnormality. 2. ASPECTS is 10. 3. Chronic  right PCA territory infarct. These results  were communicated to Dr. Arora at 1:00 am on 03/02/2024 by text page via the [facility] messaging system. Electronically Signed   By: Virgia Griffins M.D.   On: 03/02/2024 01:01    Vitals:   03/02/24 1100 03/02/24 1351 03/02/24 1410 03/02/24 1429  BP:  (!) 174/56    Pulse: 82 86 85 86  Resp: 18 18  18   Temp:  98.5 F (36.9 C)    TempSrc:  Oral    SpO2: 98% 98% 96%   Weight:   53.7 kg   Height:   5' (1.524 m)      PHYSICAL EXAM General:  Alert, well-nourished, well-developed patient in no acute distress  Psych:  Mood and affect appropriate for situation CV: Regular rate and rhythm on monitor Respiratory:  Regular, unlabored respirations on trach collar GI: Abdomen soft and nontender   NEURO:  Mental Status: drowsy. AA&Ox3, patient is able to give clear and coherent history Speech/Language: speech is without dysarthria or aphasia.  Naming, repetition, fluency, and comprehension intact.  Cranial Nerves:  II: PERRL. Visual fields with left hemianopsia (baseline)  III, IV, VI: EOMI. Eyelids elevate symmetrically.  V: Sensation is intact to light touch and symmetrical to face.  VII: Face is symmetrical resting and smiling VIII: hearing intact to voice. IX, X: Palate elevates symmetrically. Phonation is normal.  ZO:[id number] shrug 5/5. XII: tongue is midline without fasciculations. Motor: 5/5 in right upper and right lower, left arm 3/5, has some antigravity strength, left leg flaccid Tone: is normal and bulk is normal Sensation- Intact to light touch bilaterally. Extinction absent to light touch to DSS.   Coordination: FTN intact on the right   Gait- deferred  Most Recent NIH   1a Level of Conscious.:  1b LOC Questions:  1c LOC Commands:  2 Best Gaze:  3 Visual: 1 4 Facial Palsy:  5a Motor Arm - left: 2 5b Motor Arm - Right: 0 6a Motor Leg - Left: 4 6b Motor Leg - Right: 0 7 Limb Ataxia:  8 Sensory:  9 Best Language:  10  Dysarthria:  11 Extinct. and Inatten.:  TOTAL: 7   ASSESSMENT/PLAN  Nancy Sutton is a 78 y.o. female with history of  prior stroke with residual left hemianopsia, hypertension, type 2 diabetes, CAD with NSTEMI in March 2024 status post stent placement, admitted for evaluation of complaints of difficulty clearing her throat and worsening neck pain and workup revealing a fast-growing right anterior neck/thyroid  mass concerning for thyroid  malignancy for which she received a biopsy and tracheostomy on 02/29/2024-results pending.  She developed left leg weakness sometime yesterday evening   Stroke:  right MCA/ACA and MCA/PCA watershed infarcts, etiology likely large vessel disease source due to occlusion of right ICA Code Stroke  CT head No acute abnormality. ASPECTS 10.  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 Distal reconstitution with a either collateral flow at the ophthalmic artery across the circle of Willis. There appears to be moderate to severe stenosis at this location from 2012 therefore this is favored to reflect a severe high-grade stenosis that has been progressive with acutely superimposed intraluminal thrombus difficult to exclude.  CT perfusion 0/43cc MRI right hemisphere watershed infarcts 2D Echo EF 55 to 60%. LDL 24 HgbA1c 6.9 in 12/2023 VTE prophylaxis -heparin  subcu clopidogrel  75 mg daily prior to admission, now on aspirin  81 mg and Plavix  75  mg DAPT for 3 months and then Plavix  alone Therapy recommendations:  Pending Disposition: Pending  Hx of Stroke/TIA 2012 admitted for subacute right PCA infarct.  MRI showed right PCA occlusion left P2 moderate to severe stenosis, bilateral severe siphon stenosis, bilateral moderate MCA atherosclerosis.  Aspirin  changed to Plavix  on discharge.  With residual left hemianopia.  History of lone A-fib CAD/non-STEMI in 11/2022  status post stenting A-fib in 2024 in conjunction with her NSTEMI  not on any anticoagulation Follows with cardiology On Plavix  PTA  Hypertension Home meds: Cardizem  30 mg Stable Long-term BP goal normotensive  Hyperlipidemia Home meds: Zetia 10 mg, Crestor  40 mg,resumed in hospital LDL 24, goal < 70 Continue statin and Zetia at discharge  Diabetes type II UnControlled Home meds: Trulicity , insulin , metformin  HgbA1c 6.9 in 12/2023, goal < 7.0 CBGs SSI Recommend close follow-up with PCP for better DM control  Dysphagia Patient has post-stroke dysphagia SLP consulted Now n.p.o. On tube feeding  Other Stroke Risk Factors Advanced age  Other Active Problems Rapid growing neck mass concerning for thyroid  cancer - ENT on board - s/p tracheostomy and biopsy - pathology report pending  Hospital day # 6   Jonette Nestle DNP, ACNPC-AG  [facility]  ATTENDING NOTE: I reviewed above note and agree with the assessment and plan. Pt was seen and examined.   Daughter, son and husband are at bedside.  Patient lethargic, however eyes open, awake alert, follows simple commands, able to mouth words to answer question appropriately.  Has trach ostomy in place.  Orientated to place, age and time.  No gaze palsy, visual field testing showed left hemianopia, chronic.  Slight left facial droop.  Left upper extremity 1/5, left lower extremity 2/5 proximal, 2+/5 toe movement.  Right upper and lower extremity at least 4/5.  Sensation symmetrical.  Right finger-to-nose intact.  For detailed assessment and plan, please refer to above as I have made changes wherever appropriate.   Neurology will sign off. Please call with questions. Pt will follow up with stroke clinic NP at [facility] in about 4 weeks. Thanks for the consult.  Consuelo Denmark, MD PhD Stroke Neurology 03/02/2024 5:51 PM   I discussed with Dr. Ada Acres. I spent additional inpatient 30 minutes face-to-face time with the patient and  daughter, son and husband, reviewing test results, images and medication, and discussing the diagnosis, treatment plan and potential prognosis. This patient's care requiresreview of multiple databases, neurological assessment, discussion with family, other specialists and medical decision making of high complexity.      To contact Stroke Continuity provider, please refer to [email or web address]. After hours, contact General Neurology

## 2024-03-02 NOTE — Plan of Care (Signed)

## 2024-03-02 NOTE — Consult Note (Signed)
 NEUROLOGY CONSULT NOTE   Date of service: March 02, 2024 Patient Name: Nancy Sutton MRN:  [id number] DOB:  1945/12/31 Chief Complaint: left sided weakness Requesting Provider: Etter Hermann., *  History of Present Illness  Nancy Sutton is a 78 y.o. female with hx of prior stroke with residual left hemianopsia, hypertension, type 2 diabetes, CAD with NSTEMI in March 2024 status post stent placement, admitted for evaluation of complaints of difficulty clearing her throat and worsening neck pain and workup revealing a fast-growing right anterior neck/thyroid  mass concerning for thyroid  malignancy for which she received a biopsy and tracheostomy on 02/29/2024-results pending. She was moved from the ICU to the floor sometime this evening but had been complaining of some left leg weakness.  It was not until she was examined by the floor nurse that it was discovered that her left leg was flaccid. Initially the timeline of the left-sided weakness/last known well was thought to be around midnight or so but on further discussion with the patient and her husband, she had been having trouble with that left leg since prior to shift change and the best last known well that we could come up was when she had dinner around 5 PM. She was taken for an emergent CT head which was unremarkable CT angiography head and neck was done along with CT perfusion studies and she was passed the 6-hour window-see details below.  LKW: Unclear - closer to dinner time which was around 5pm. See details above. Modified rankin score: 3-Moderate disability-requires help but walks WITHOUT assistance IV Thrombolysis: No - unclear LKW EVT: No-multiple reasons-current comorbidities especially aggressive malignancy, poor baseline with the malignancy and possible progressive severe stenosis and ICAD, making the procedure more risky based on discussion with neurointerventional radiology, patient's son and patient's husband--after  discussion of risks benefits and alternatives, family chose not to proceed. NIHSS 6   ROS  Comprehensive ROS performed and pertinent positives documented in HPI   Past History   Past Medical History:  Diagnosis Date   Arthritis    CAD (coronary artery disease) 03/28/2023   NSTEMI 11/2022 s/p 3 x 16 mm DES to the [facility], 3 x 20 mm DES to the mRCA    - LHC 12/13/2022: RCA ostial 95, mid 90; LCx mid 70; LAD mid 40, distal 70>> Med Rx for LAD, LCx   - TTE 12/09/2022: EF 50-55, no RWMA, mild concentric LVH, GR 2 DD, normal RVSF, mild MR, RAP 15   Diabetes mellitus    since 2008   Essential hypertension, benign 09/10/2015   GERD (gastroesophageal reflux disease)    Hypercholesteremia    Hyperlipidemia    Stroke (HCC) 03/21/2011   only deficits-affected eyesight    Past Surgical History:  Procedure Laterality Date   BIOPSY  07/02/2022   Procedure: BIOPSY;  Surgeon: Urban Garden, MD;  [facility];  Service: Gastroenterology;;   CATARACT EXTRACTION W/PHACO  11/01/2011   Procedure: CATARACT EXTRACTION PHACO AND INTRAOCULAR LENS PLACEMENT (IOC);  Surgeon: Anner Kill, MD;  Location: [facility];  Service: Ophthalmology;  Laterality: Right;  CDE 12.48   CORONARY STENT INTERVENTION N/A 12/10/2022   Procedure: CORONARY STENT INTERVENTION;  Surgeon: Arnoldo Lapping, MD;  Location: [facility] INVASIVE CV LAB;  Service: Cardiovascular;  Laterality: N/A;   ESOPHAGEAL DILATION N/A 04/29/2017   Procedure: ESOPHAGEAL DILATION;  Surgeon: Ruby Corporal, MD;  [facility]: [facility];  Service: Endoscopy;  Laterality: N/A;   ESOPHAGOGASTRODUODENOSCOPY N/A 04/29/2017  Procedure: ESOPHAGOGASTRODUODENOSCOPY (EGD);  Surgeon: Ruby Corporal, MD;  [facility];  Service: Endoscopy;  Laterality: N/A;  1015   ESOPHAGOGASTRODUODENOSCOPY (EGD) WITH ESOPHAGEAL DILATION N/A 02/14/2013   Procedure: ESOPHAGOGASTRODUODENOSCOPY (EGD) WITH ESOPHAGEAL DILATION;  Surgeon: Ruby Corporal, MD;  [facility];  Service: Endoscopy;  Laterality: N/A;  200-moved to 255 Ann to notify pt   ESOPHAGOGASTRODUODENOSCOPY (EGD) WITH PROPOFOL  N/A 05/28/2022   Procedure: ESOPHAGOGASTRODUODENOSCOPY (EGD) WITH PROPOFOL ;  Surgeon: Urban Garden, MD;  [facility];  Service: Gastroenterology;  Laterality: N/A;  1030 ASA 2   ESOPHAGOGASTRODUODENOSCOPY (EGD) WITH PROPOFOL  N/A 07/02/2022   Procedure: ESOPHAGOGASTRODUODENOSCOPY (EGD) WITH PROPOFOL ;  Surgeon: Urban Garden, MD;  Location: [facility];  Service: Gastroenterology;  Laterality: N/A;  105 ASA 2, pt knows new time per Hugo Maes   EYE SURGERY  10-2010   left eye removal of cataract   LEFT HEART CATH AND CORONARY ANGIOGRAPHY N/A 12/10/2022   Procedure: LEFT HEART CATH AND CORONARY ANGIOGRAPHY;  Surgeon: Arnoldo Lapping, MD;  Location: [facility] INVASIVE CV LAB;  Service: Cardiovascular;  Laterality: N/A;   MASS BIOPSY N/A 02/29/2024   Procedure: BIOPSY, THYROID ;  Surgeon: Reynold Caves, MD;  Location: [facility] OR;  Service: ENT;  Laterality: N/A;   TRACHEOSTOMY TUBE PLACEMENT N/A 02/29/2024   Procedure: CREATION, TRACHEOSTOMY;  Surgeon: Reynold Caves, MD;  Location: [facility] OR;  Service: ENT;  Laterality: N/A;  WITH THYROID  BIOPSY   TUBAL LIGATION  1984    Family History: Family History  Problem Relation Age of Onset   Cancer Mother    Diabetes Mother    Cancer Father    Diabetes Father    Anesthesia problems Neg Hx    Hypotension Neg Hx    Malignant hyperthermia Neg Hx    Pseudochol deficiency Neg Hx     Social History  reports that she has never smoked. She has never used smokeless tobacco. She reports that she does not drink alcohol and does not use drugs.  Allergies  Allergen Reactions   Ampicillin Hives    Breaks out in bumps Has patient had a PCN reaction causing immediate rash, facial/tongue/throat swelling, SOB or lightheadedness with hypotension: No Has patient had a PCN reaction causing severe rash involving mucus  membranes or skin necrosis: Yes Has patient had a PCN reaction that required hospitalization: No Has patient had a PCN reaction occurring within the last 10 years: No If all of the above answers are NO, then may proceed with Cephalosporin use.    Bromday [Bromfenac Sodium] Itching and Swelling    Eye Drops   Lisinopril Cough   Tetanus Toxoids Hives    Breaks out in bumps    Medications   Current Facility-Administered Medications:    0.9 %  sodium chloride  infusion, , Intravenous, Continuous, Ramaya Guile, MD   acetaminophen  (TYLENOL ) tablet 650 mg, 650 mg, Oral, Q6H PRN **OR** acetaminophen  (TYLENOL ) suppository 650 mg, 650 mg, Rectal, Q6H PRN, Elgergawy, Ardia Kraft, MD   diltiazem  (CARDIZEM ) tablet 30 mg, 30 mg, Oral, BID, Paytes, Austin A, RPH, 30 mg at 03/01/24 2333   feeding supplement (OSMOLITE 1.5 CAL) liquid 1,000 mL, 1,000 mL, Per Tube, Continuous, Elgergawy, Ardia Kraft, MD, Last Rate: 25 mL/hr at 03/01/24 0600, Infusion Verify at 03/01/24 0600   feeding supplement (PROSource TF20) liquid 60 mL, 60 mL, Per Tube, Daily, Elgergawy, Ardia Kraft, MD, 60 mL at 03/01/24 1022   free water  100 mL, 100 mL, Per Tube, Q4H,  Elgergawy, Ardia Kraft, MD, 100 mL at 03/02/24 0031   heparin  injection 5,000 Units, 5,000 Units, Subcutaneous, Q8H, Elgergawy, Dawood S, MD, 5,000 Units at 03/01/24 2217   HYDROcodone -acetaminophen  (NORCO/VICODIN) 5-325 MG per tablet 1-2 tablet, 1-2 tablet, Oral, Q4H PRN, Elgergawy, Ardia Kraft, MD   insulin  aspart (novoLOG ) injection 0-15 Units, 0-15 Units, Subcutaneous, Q4H, Etter Hermann., MD, 5 Units at 03/01/24 2029   insulin  glargine-yfgn (SEMGLEE ) injection 8 Units, 8 Units, Subcutaneous, QHS, Elgergawy, Dawood S, MD, 8 Units at 2024-03-22 2109   latanoprost  (XALATAN ) 0.005 % ophthalmic solution 1 drop, 1 drop, Both Eyes, QHS, Elgergawy, Ardia Kraft, MD, 1 drop at 03/01/24 2218   morphine  (PF) 2 MG/ML injection 2 mg, 2 mg, Intravenous, Q2H PRN, Elgergawy, Dawood S, MD    ondansetron  (ZOFRAN ) tablet 4 mg, 4 mg, Oral, Q6H PRN **OR** ondansetron  (ZOFRAN ) injection 4 mg, 4 mg, Intravenous, Q6H PRN, Elgergawy, Ardia Kraft, MD, 4 mg at 03/22/24 1121   pantoprazole  (PROTONIX ) EC tablet 40 mg, 40 mg, Oral, Daily, Elgergawy, Dawood S, MD, 40 mg at 03/01/24 1021   polyethylene glycol (MIRALAX  / GLYCOLAX ) packet 17 g, 17 g, Oral, Daily, [place], Estelita Helena., MD   rosuvastatin  (CRESTOR ) tablet 40 mg, 40 mg, Oral, Daily, Elgergawy, Dawood S, MD, 40 mg at 03/01/24 1022   timolol  (TIMOPTIC ) 0.5 % ophthalmic solution 1 drop, 1 drop, Both Eyes, BID, Craven Do, RPH, 1 drop at 03/01/24 2218  Vitals   Vitals:   03/01/24 2000 03/01/24 2034 03/01/24 2158 03/01/24 2332  BP: (!) 166/59  (!) 170/58 (!) 170/58  Pulse: 82 80 79 78  Resp: (!) 27 17  19   Temp:   98.4 F (36.9 C) 97.7 F (36.5 C)  TempSrc:   Oral   SpO2: 99% 100% 100% 100%  Weight:      Height:        Body mass index is 23.94 kg/m.   Physical Exam   General: Awake alert, intermittently in distress due to not being able to clear her throat. HEENT: Normocephalic atraumatic, trach in place Neurological exam She is awake alert oriented x 3 Speech is somewhat hypophonic in the setting of the tracheostomy. No frank dysarthria Cranial nerves II to XII: Pupils equal round react light, extraocular movements unhindered, left homonymous hemianopsia that is baseline but she also has some right superior quadrantanopsia which was somewhat difficult to reliably reproduce, symmetric facies. Motor examination with left lower extremity with minimal twitching but no significant movement.  Left upper extremity with mild drift.  Right side full strength. Sensation intact Coordination with no gross dysmetria on the right, unable to perform on the left  Labs/Imaging/Neurodiagnostic studies   CBC:  Recent Labs  Lab 2024/03/22 0534 03/01/24 0400  WBC 8.4 16.0*  NEUTROABS 5.8 14.0*  HGB 8.6* 9.5*  HCT 26.0* 29.3*  MCV  85.2 86.2  PLT 295 350   Basic Metabolic Panel:  Lab Results  Component Value Date   NA 136 03/01/2024   K 4.1 03/01/2024   CO2 22 03/01/2024   GLUCOSE 227 (H) 03/01/2024   BUN 21 03/01/2024   CREATININE 0.78 03/01/2024   CALCIUM  8.9 03/01/2024   GFRNONAA >60 03/01/2024   GFRAA 82 07/20/2019   Lipid Panel:  Lab Results  Component Value Date   LDLCALC 43 08/12/2023   HgbA1c:  Lab Results  Component Value Date   HGBA1C 6.9 (A) 01/03/2024   INR  Lab Results  Component Value Date   INR  1.1 02/26/2024   APTT  Lab Results  Component Value Date   APTT 32 04/30/2011   CT Head without contrast(Personally reviewed): Aspects 10.  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  Distal reconstitution with a either collateral flow at the ophthalmic artery across the circle of Willis.  There appears to be moderate to severe stenosis at this location from 2012 therefore this is favored to reflect a severe high-grade stenosis that has been progressive with acutely superimposed intraluminal thrombus difficult to exclude.  CT perfusion study with 43 cc of delayed perfusion with no core.  Atheromatous changes around the left carotid siphon with resultant moderate to severe stenosis of the paraclinoid region.  Diffusely irregular and attenuated right PCA keeping with a chronic right PCA infarct.  Heterogeneous enhancing mass at the right thyroid  fossa presumably reflecting known thyroid  carcinoma.  Scattered partially necrotic lymph nodes within the adjacent right neck concerning for nodal metastatic disease.  Changes are relatively similar compared to the neck CT from 02/28/2024.  Interval placement of tracheostomy tube with tip well-positioned in the upper airway.   ASSESSMENT   Nancy Sutton is a 77 y.o. female past history of prior stroke with residual left  hemianopsia, hypertension, type 2 diabetes, CAD with prior history of NSTEMI status post and placement, admitted for evaluation of throat pain and difficulty clearing her neck with workup revealing a fast growing thyroid /neck mass with concern for thyroid  malignancy-biopsy results pending.  She underwent tracheostomy due to respiratory compromise from compression. Today she had sudden onset of left-sided hemiparesis leg worse than arm for which an emergent stroke evaluation was performed. Outside the window for TNKase. 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 Detailed discussion had with son, husband and the interventionalist on the multi way phone call-risks benefits and alternatives of thrombectomy discussed.  Given the fact that her general health is not very good as well as the fact that stroke is resultant of intracranial atherosclerotic disease with a acute occlusion on chronic stenosis or progression of chronic stenosis and hypoperfusion, she may not be a good candidate for intervention-family agreed to not proceed with any sort of intervention because the risks would be higher than the benefits.  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  RECOMMENDATIONS  Frequent neurochecks Telemetry I would recommend resumption of antiplatelet when okay with primary team. Documented on her chart is paroxysmal atrial fibrillation-with subsequent cardiac monitoring not revealing any A-fib.  Rate controlled with diltiazem .  I will continue cardiac monitoring for now MRI of the brain when able to 2D echo A1c Lipid panel Therapy evaluations including physical therapy, Occupational Therapy and  speech therapy N.p.o. until cleared by bedside swallow evaluation. High intensity statin for goal LDL less than 70 Will wait for the full workup including MRI of the brain to be completed to be able to provide further opinion on timing of surgeries as the thyroid  mass is being considered for surgical options.  Plan was discussed in detail with the patient's son over the phone, husband at bedside.  I appreciate Dr. Laverta Potters from  interventional neuroradiology taking the time to review the films and discussed the case with me and discussed the case with family in detail.  Plan was relayed to Dr. [facility] Lavender over the phone  Stroke team to follow  ______________________________________________________________________  Signed, Tona Francis, MD [facility]  CRITICAL CARE ATTESTATION Performed by: Tona Francis, MD Total critical care time: 100 minutes Critical care time was exclusive of separately billable procedures and treating other patients and/or supervising APPs/Residents/Students Critical care was necessary to treat or prevent imminent or life-threatening deterioration. This patient is critically ill and at significant risk for neurological worsening and/or death and care requires constant monitoring. Critical care was time spent personally by me on the following activities: development of treatment plan with patient and/or surrogate as well as nursing, discussions with consultants, evaluation of patient's response to treatment, examination of patient, obtaining history from patient or surrogate, ordering and performing treatments and interventions, ordering and review of laboratory studies, ordering and review of radiographic studies, pulse oximetry, re-evaluation of patient's condition, participation in multidisciplinary rounds and medical decision making of high complexity in the care of this patient.

## 2024-03-02 NOTE — Care Management Important Message (Signed)
 Important Message  Patient Details  Name: Nancy Sutton MRN: [id number] Date of Birth: September 13, 1946   Important Message Given:  Yes - Medicare IM     Wynonia Hedges 03/02/2024, 3:36 PM      Patient left prior to IM delivery will mail a copy to the patient home address

## 2024-03-02 NOTE — Evaluation (Signed)
 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   Impacted cerumen of both ears 11/28/2023   CAD (coronary artery disease) 03/28/2023   Paroxysmal atrial fibrillation (HCC) 12/11/2022   NSTEMI (non-ST elevated myocardial infarction) (HCC) 12/08/2022   Diabetes mellitus with hyperglycemia (HCC) 12/08/2022   Barrett's esophagus 09/26/2019   Uncontrolled type 1 diabetes mellitus with hyperglycemia (HCC) 02/02/2018   Essential hypertension, benign 09/10/2015   Left homonymous hemianopsia 06/21/2013   Hyperlipidemia 01/30/2013   GERD (gastroesophageal reflux disease) 01/30/2013   Cerebral infarction (HCC) 01/30/2013   Arthritis 01/30/2013   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/2013   Past Medical History:  Past Medical History:  Diagnosis Date   Arthritis    CAD (coronary artery disease) 03/28/2023   NSTEMI 11/2022 s/p 3 x 16 mm DES to the [facility], 3 x 20 mm DES to the mRCA    - LHC 12/13/2022: RCA ostial 95, mid 90; LCx mid 70; LAD mid 40, distal 70>> Med Rx for LAD, LCx   - TTE 12/09/2022: EF 50-55, no RWMA, mild concentric LVH, GR 2 DD, normal RVSF, mild MR, RAP 15   Diabetes mellitus    since 2008   Essential hypertension, benign 09/10/2015   GERD (gastroesophageal reflux disease)    Hypercholesteremia    Hyperlipidemia    Stroke (HCC) 03/21/2011   only deficits-affected eyesight   Past Surgical History:  Past Surgical History:  Procedure Laterality Date   BIOPSY  07/02/2022   Procedure: BIOPSY;  Surgeon: Urban Garden, MD;  [facility];  Service: Gastroenterology;;   CATARACT EXTRACTION W/PHACO  11/01/2011   Procedure: CATARACT EXTRACTION PHACO AND INTRAOCULAR LENS PLACEMENT (IOC);  Surgeon: Anner Kill, MD;  Location: [facility];  Service: Ophthalmology;  Laterality: Right;  CDE 12.48   CORONARY STENT INTERVENTION N/A 12/10/2022   Procedure: CORONARY STENT INTERVENTION;  Surgeon: Arnoldo Lapping, MD;  Location: [facility] INVASIVE CV LAB;  Service: Cardiovascular;  Laterality: N/A;   ESOPHAGEAL DILATION N/A 04/29/2017   Procedure: ESOPHAGEAL DILATION;  Surgeon: Ruby Corporal, MD;  [facility];  Service: Endoscopy;  Laterality: N/A;   ESOPHAGOGASTRODUODENOSCOPY N/A 04/29/2017   Procedure: ESOPHAGOGASTRODUODENOSCOPY (EGD);  Surgeon: Ruby Corporal, MD;  [facility];  Service: Endoscopy;  Laterality: N/A;  1015   ESOPHAGOGASTRODUODENOSCOPY (EGD) WITH ESOPHAGEAL DILATION N/A 02/14/2013   Procedure: ESOPHAGOGASTRODUODENOSCOPY (EGD) WITH ESOPHAGEAL DILATION;  Surgeon: Ruby Corporal, MD;  [facility];  Service: Endoscopy;  Laterality: N/A;  200-moved to 255 Ann to notify pt   ESOPHAGOGASTRODUODENOSCOPY (EGD) WITH PROPOFOL  N/A 05/28/2022   Procedure: ESOPHAGOGASTRODUODENOSCOPY (EGD) WITH PROPOFOL ;  Surgeon: Urban Garden, MD;  [facility];  Service: Gastroenterology;  Laterality: N/A;  1030 ASA 2   ESOPHAGOGASTRODUODENOSCOPY (EGD) WITH PROPOFOL  N/A 07/02/2022   Procedure: ESOPHAGOGASTRODUODENOSCOPY (EGD) WITH PROPOFOL ;  Surgeon: Urban Garden, MD;  [facility];  Service: Gastroenterology;  Laterality: N/A;  105 ASA 2, pt knows new time per Hugo Maes   EYE SURGERY  10-2010   left eye removal of cataract   LEFT  HEART CATH AND CORONARY ANGIOGRAPHY N/A 12/10/2022   Procedure: LEFT HEART CATH AND CORONARY ANGIOGRAPHY;  Surgeon: Arnoldo Lapping, MD;  Location: [facility] INVASIVE CV LAB;  Service: Cardiovascular;  Laterality: N/A;   MASS BIOPSY  N/A 02/29/2024   Procedure: BIOPSY, THYROID ;  Surgeon: Reynold Caves, MD;  Location: [facility] OR;  Service: ENT;  Laterality: N/A;   TRACHEOSTOMY TUBE PLACEMENT N/A 02/29/2024   Procedure: CREATION, TRACHEOSTOMY;  Surgeon: Reynold Caves, MD;  Location: [facility] OR;  Service: ENT;  Laterality: N/A;  WITH THYROID  BIOPSY   TUBAL LIGATION  1984   HPI:  78 yo female presenting 6/7 with rapidly enlarging thyroid  mass. CT Neck showed 4.1 x 2.5 x 3.9 cm heterogeneous mass in the right anterior neck at  the level of the thyroid  concerning for residual/recurrent thyroid  malignancy. Associated mass effect and leftward shift of the airway without significant airway narrowing. 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 Associated retropharyngeal edema. Trach done by ENT 6/11. Code stroke was called 6/13 due to L sided weakness although time of onset likely the day prior. MRI pending. PMH includes: GERD, stroke, Barrett's esophagus with esophageal dysphagia s/p dilation, HLD, NSTEMI, PAF, CAD, HTN   Assessment / Plan / Recommendation Clinical Impression  Pt does not apepar to have overt changes to cognition or communication from previous date. She and her family do not notice any subjective changes. She is oriented x4, recalled 4/4 words with significantly delayed recall, and followed commands well. Her speech is intelligible and her language is fluent. She attended to tasks well even though she began to fatigue across the session. SLP will plan to f/u with more standardized testing when not as fatigued to provide more challenging with higher level cognitive abilities.    SLP Assessment  SLP Recommendation/Assessment: Patient needs continued Speech Language Pathology Services SLP Visit Diagnosis: Cognitive communication deficit (R41.841)     Assistance Recommended at Discharge  Intermittent Supervision/Assistance  Functional Status  Assessment Patient has had a recent decline in their functional status and demonstrates the ability to make significant improvements in function in a reasonable and predictable amount of time.  Frequency and Duration min 1 x/week  1 week      SLP Evaluation Cognition  Overall Cognitive Status: Within Functional Limits for tasks assessed (across basic, functional tasks)       Comprehension  Auditory Comprehension Overall Auditory Comprehension: Appears within functional limits for tasks assessed    Expression Expression Primary Mode of Expression: Verbal Verbal Expression Overall Verbal Expression: Appears within functional limits for tasks assessed   Oral / Motor  Oral Motor/Sensory Function Overall Oral Motor/Sensory Function: Within functional limits Motor Speech Overall Motor Speech: Appears within functional limits for tasks assessed Intelligibility: Intelligible            Beth Brooke., M.A. CCC-SLP Acute Rehabilitation Services Office: [phone]  Secure chat preferred  03/02/2024, 3:41 PM

## 2024-03-02 NOTE — Progress Notes (Signed)
 Nutrition Follow-up  DOCUMENTATION CODES:   Not applicable  INTERVENTION:   Initiate tube feeding via Cortrak: Osmolite 1.5 at 45 ml/h (1,080 ml per day) Prosource TF20 60 ml daily 100 ml FWF Q4H   Provides 1700 kcal, 87  gm protein, 823 ml free water  daily (1,423 ml water  daily TF + FWF)  Monitor for diet advancement and add ONS as appropriate  MVI daily per tube  NUTRITION DIAGNOSIS:   Inadequate oral intake related to dysphagia as evidenced by NPO status. - Still applicable   GOAL:   Patient will meet greater than or equal to 90% of their needs - Meeting via TF's   MONITOR:   Labs, Weight trends, TF tolerance, I & O's, Skin  REASON FOR ASSESSMENT:   Consult Enteral/tube feeding initiation and management  ASSESSMENT:   78 y/o female with h/o HLD, GERD, stroke, NSTEMI, PAF, CAD, HTN, Barrett's esophagus with esophageal dysphagia s/p dilation who is admitted with thyroid  carcinoma s/p tracheostomy and NGT placement 6/11.  6/11: NGT placed, tube feeds started  6/12: Dysphagia 2, nectar thick liquids  6/13: NGT replaced for Cortrak, NPO  Code stroke last night.Pt getting ready to be transported to MRI on visit, spoke to RN. Pt was advanced to a DYS 2 diet, with nectar thick liquids yesterday and per RN did not tolerate dinner. RN unable to clarify why pt did not tolerate dinner. Messaged MD to see if NGT can be replaced with a cortrak feeding tube. Pt currently with 18 french NGT which can be hindering her ability to eat. MD ok with Cortrak feeding tube today. Pt continues to be NPO until SLP can evaluate.   Admit weight: 54.4 kg Current weight: 53.7 kg   Average Meal Intake: No meals documented  Nutritionally Relevant Medications: Scheduled Meds:  [START ON 03/03/2024]  stroke: early stages of recovery book   Does not apply Once   diltiazem   30 mg Oral BID   feeding supplement (PROSource TF20)  60 mL Per Tube Daily   free water   100 mL Per Tube Q4H   heparin    5,000 Units Subcutaneous Q8H   insulin  aspart  0-15 Units Subcutaneous Q4H   insulin  glargine-yfgn  8 Units Subcutaneous QHS   latanoprost   1 drop Both Eyes QHS   pantoprazole   40 mg Oral Daily   polyethylene glycol  17 g Oral Daily   rosuvastatin   40 mg Oral Daily   timolol   1 drop Both Eyes BID   Continuous Infusions:  sodium chloride  100 mL/hr at 03/02/24 0249   feeding supplement (OSMOLITE 1.5 CAL) 25 mL/hr at 03/01/24 0600   Labs Reviewed: No pertinent labs  CBG ranges from 91-323 mg/dL over the last 24 hours HgbA1c 6.9  Diet Order:   Diet Order             Diet NPO time specified  Diet effective now                   EDUCATION NEEDS:   Education needs have been addressed  Skin:  Skin Assessment: Reviewed RN Assessment (Incision neck)  Last BM:  6/7  Height:   Ht Readings from Last 1 Encounters:  03/02/24 5' (1.524 m)    Weight:   Wt Readings from Last 1 Encounters:  03/02/24 53.7 kg    Ideal Body Weight:  45.45 kg  BMI:  Body mass index is 23.12 kg/m.  Estimated Nutritional Needs:   Kcal:  1600-1800kcal/day  Protein:  80-90g/day  Fluid:  1.4-1.6L/day   Frederik Jansky, RD Registered Dietitian  See Amion for more information

## 2024-03-02 NOTE — Significant Event (Signed)
 Patient's nurse notified me that patient is having some weakness in the left lower extremity.  On exam at bedside patient is not able to lift her lower extremity and has minimal strength on left lower extremity.  Left upper extremity has pronator drift.  Patient states her symptoms started about half hour before I saw her.  I discussed with neurologist Dr. Tona Francis and activated code stroke.  Melford Spotted.

## 2024-03-02 NOTE — Progress Notes (Signed)
*  PRELIMINARY RESULTS* Echocardiogram 2D Echocardiogram has been performed.  Nancy Sutton 03/02/2024, 9:34 AM

## 2024-03-02 NOTE — Progress Notes (Signed)
 SLP Cancellation Note  Patient Details Name: Nancy Sutton MRN: [id number] DOB: 05/20/1946   Cancelled treatment:       Reason Eval/Treat Not Completed: Patient at procedure or test/unavailable. Attempted to see pt this morning but she was leaving the floor for MRI. Will follow back up as able.     Beth Brooke., M.A. CCC-SLP Acute Rehabilitation Services Office: [phone]  Secure chat preferred  03/02/2024, 12:51 PM

## 2024-03-02 NOTE — Progress Notes (Signed)
 PT Cancellation Note  Patient Details Name: Nancy Sutton MRN: [id number] DOB: 1946-02-19   Cancelled Treatment:    Reason Eval/Treat Not Completed: Fatigue/lethargy limiting ability to participate - pt and family state pt is too fatigued to participate in PT today. Pt requesting PT come back tomorrow.   Jalal Rauch S, PT DPT Acute Rehabilitation Services Secure Chat Preferred  Office [phone]    Ethridge Herder 03/02/2024, 12:24 PM

## 2024-03-03 ENCOUNTER — Inpatient Hospital Stay (HOSPITAL_COMMUNITY)

## 2024-03-03 DIAGNOSIS — R221 Localized swelling, mass and lump, neck: Secondary | ICD-10-CM | POA: Diagnosis not present

## 2024-03-03 DIAGNOSIS — C73 Malignant neoplasm of thyroid gland: Secondary | ICD-10-CM | POA: Diagnosis not present

## 2024-03-03 DIAGNOSIS — Z930 Tracheostomy status: Secondary | ICD-10-CM | POA: Diagnosis not present

## 2024-03-03 LAB — GLUCOSE, CAPILLARY
Glucose-Capillary: 254 mg/dL — ABNORMAL HIGH (ref 70–99)
Glucose-Capillary: 301 mg/dL — ABNORMAL HIGH (ref 70–99)
Glucose-Capillary: 303 mg/dL — ABNORMAL HIGH (ref 70–99)
Glucose-Capillary: 331 mg/dL — ABNORMAL HIGH (ref 70–99)
Glucose-Capillary: 352 mg/dL — ABNORMAL HIGH (ref 70–99)
Glucose-Capillary: 373 mg/dL — ABNORMAL HIGH (ref 70–99)
Glucose-Capillary: 394 mg/dL — ABNORMAL HIGH (ref 70–99)

## 2024-03-03 LAB — BASIC METABOLIC PANEL WITH GFR
Anion gap: 9 (ref 5–15)
BUN: 27 mg/dL — ABNORMAL HIGH (ref 8–23)
CO2: 23 mmol/L (ref 22–32)
Calcium: 8.9 mg/dL (ref 8.9–10.3)
Chloride: 101 mmol/L (ref 98–111)
Creatinine, Ser: 0.9 mg/dL (ref 0.44–1.00)
GFR, Estimated: >60 mL/min (ref >60)
Glucose, Bld: 326 mg/dL — ABNORMAL HIGH (ref 70–99)
Potassium: 4 mmol/L (ref 3.5–5.1)
Sodium: 133 mmol/L — ABNORMAL LOW (ref 135–145)

## 2024-03-03 LAB — CBC
HCT: 31.7 % — ABNORMAL LOW (ref 36.0–46.0)
Hemoglobin: 9.9 g/dL — ABNORMAL LOW (ref 12.0–15.0)
MCH: 27 pg (ref 26.0–34.0)
MCHC: 31.2 g/dL (ref 30.0–36.0)
MCV: 86.6 fL (ref 80.0–100.0)
Platelets: 376 10*3/uL (ref 150–400)
RBC: 3.66 MIL/uL — ABNORMAL LOW (ref 3.87–5.11)
RDW: 13.7 % (ref 11.5–15.5)
WBC: 13.3 10*3/uL — ABNORMAL HIGH (ref 4.0–10.5)
nRBC: 0 % (ref 0.0–0.2)

## 2024-03-03 MED ORDER — ORAL CARE MOUTH RINSE
15.0000 mL | OROMUCOSAL | Status: DC
  Administered 2024-03-03 – 2024-03-13 (×42): 15 mL via OROMUCOSAL

## 2024-03-03 MED ORDER — INSULIN GLARGINE-YFGN 100 UNIT/ML ~~LOC~~ SOLN
15.0000 [IU] | Freq: Every day | SUBCUTANEOUS | Status: DC
  Administered 2024-03-03: 15 [IU] via SUBCUTANEOUS
  Filled 2024-03-03 (×2): qty 0.15

## 2024-03-03 MED ORDER — PHENOL 1.4 % MT LIQD
1.0000 | OROMUCOSAL | Status: DC | PRN
  Administered 2024-03-03: 1 via OROMUCOSAL
  Filled 2024-03-03: qty 177

## 2024-03-03 MED ORDER — INSULIN GLARGINE-YFGN 100 UNIT/ML ~~LOC~~ SOLN
12.0000 [IU] | Freq: Every day | SUBCUTANEOUS | Status: DC
  Filled 2024-03-03: qty 0.12

## 2024-03-03 MED ORDER — TRAZODONE HCL 50 MG PO TABS
50.0000 mg | ORAL_TABLET | Freq: Every day | ORAL | Status: DC
  Administered 2024-03-03 – 2024-03-04 (×2): 50 mg
  Filled 2024-03-03 (×2): qty 1

## 2024-03-03 MED ORDER — ORAL CARE MOUTH RINSE: 15.0000 mL | OROMUCOSAL | Status: DC | PRN

## 2024-03-03 NOTE — Progress Notes (Signed)
 Patient ID: Nancy Sutton, female   DOB: May 23, 1946, 78 y.o.   MRN: [id number]       Patient seen this morning, husband at bedside.  Events noted.  Appreciate care by Dr. [name] and entire medical team.  Discussed with husband.  Patient somnolent.  Have discussed options with endocrine surgeons at [facility] - Dr. Gaynell Keeler and Dr. Dawayne Estrin.  However, given current condition and clinical situation, patient not a candidate for transfer or evaluation in that program.  Dr. [name] has contacted [facility] as well.  Will follow with you.  Please contact me if I can be of assistance to this nice family.  [name], MD [facility] practice Office: [phone]

## 2024-03-03 NOTE — Progress Notes (Signed)
 Speech Language Pathology Treatment: Dysphagia  Patient Details Name: Nancy Sutton MRN: [id number] DOB: [birth year] Today's Date: 03/03/2024 Time: 6295-2841 SLP Time Calculation (min) (ACUTE ONLY): 12 min  Assessment / Plan / Recommendation Clinical Impression  SLP returned to patient's room for education with daughter in person and son in law via phone. SLP discussed results of today's MBS and how it compared to MBS completed on 6/12 and that the main difference was slower motility of honey thick ,nectar thick and puree barium in pharynx and through PES but with aspiration risk appearing very similar. Patient herself was lethargic and about to fall asleep. SLP discussed suspicions that her performance today was perhaps slightly decreased because she is more lethargic and fatigues easily. Son in law asked SLP about reason for patient's secretions to seem like they have gotten worse since yesterday and RN in room responded to this question and let him know that patient has been able to clear secretions well and when she does perform tracheal suctioning, the amount of secretions she pulls out is minimal. SLP recommending continue NPO, continue allowing PRN ice chips but add floor stock nectar thick liquids and puree/pudding solids when she is awake, alert and desiring. SLP will follow for full PO readiness.    HPI HPI: 78 yo female presenting 6/7 with rapidly enlarging thyroid  mass. CT Neck showed 4.1 x 2.5 x 3.9 cm heterogeneous mass in the right anterior neck at  the level of the thyroid  concerning for residual/recurrent thyroid  malignancy. Associated mass effect and leftward shift of the airway without significant airway narrowing. 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 Associated retropharyngeal edema. Trach done by ENT 6/11. Code stroke was called 6/13 due to L sided weakness although time of onset  likely the day prior. BS completed on 6/12 recommending Dys 2, nectar thick liquids but patient the next day, was having difficulty swallowing and had new onset stroke symptoms. MRI showed Patchy acute ischemic nonhemorrhagic right frontal, parietal, and occipital infarcts, watershed in distribution. Patient made NPO awaiting reevaluation of swallowing with repeat MBS recommended. Large bore NG changed to small bore. PMH includes: GERD, stroke, Barrett's esophagus with esophageal dysphagia s/p dilation, HLD, NSTEMI, PAF, CAD, HTN      SLP Plan  Continue with current plan of care          Recommendations  Diet recommendations: NPO;Other(comment) (floor stock nectars, puree/pudding, ice chips PRN) Medication Administration: Via alternative means Postural Changes and/or Swallow Maneuvers: Seated upright 90 degrees      PMSV Supervision: Full MD: Please consider changing trach tube to : Cuffless;Smaller size           Oral care QID;Oral care prior to ice chip/H20;Oral care before and after PO   Intermittent Supervision/Assistance Dysphagia, oropharyngeal phase (R13.12)     Continue with current plan of care     Jacqualine Mater, MA, CCC-SLP Speech Therapy

## 2024-03-03 NOTE — Evaluation (Signed)
 Occupational Therapy Evaluation Patient Details Name: Nancy Sutton MRN: [id number] DOB: 07-Oct-1945 Today's Date: 03/03/2024   History of Present Illness   78 yo female [facility] 6/7 with SOB. 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 S/p trach 6/10. 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 PMH includes CVA with residual L hemianopsia, HTN, DMII, CAD s/p NSTEMI and stenting 2024, OA.     Clinical Impressions Pt's husband was in the room and reported they were completely independent prior to this hospital stay. At this Nancy Sutton was fatigued and often needing verbal cues to keep eyes open in session but still wanting to work with therapist. She was able to go from supine to sitting with mx x2 and was able to sit at EOB for about 10 mins but then started to further fatigue and required total assist x2 for sitting to supine. Patient will benefit from intensive inpatient follow-up therapy, >3 hours/day.     If plan is discharge home, recommend the following:   Two people to help with walking and/or transfers;Two people to help with bathing/dressing/bathroom;Assistance with cooking/housework;Assistance with feeding;Direct supervision/assist for medications management;Direct supervision/assist for financial management;Assist for transportation;Help with stairs or ramp for entrance;Supervision due to cognitive status     Functional Status Assessment   Patient has had a recent decline in their functional status and demonstrates the ability to make significant improvements in function in a reasonable and predictable amount of time.     Equipment Recommendations   Other (comment) (TBD)     Recommendations for Other Services         Precautions/Restrictions   Precautions Precautions: Fall Recall of  Precautions/Restrictions: Impaired Restrictions Weight Bearing Restrictions Per Provider Order: No     Mobility Bed Mobility Overal bed mobility: Needs Assistance Bed Mobility: Supine to Sit, Sit to Supine     Supine to sit: Max assist, +2 for physical assistance, +2 for safety/equipment Sit to supine: +2 for physical assistance, +2 for safety/equipment, Total assist   General bed mobility comments: Required assist at trunch and BLE to go from supine to sitting and sitting to supine    Transfers Overall transfer level: Needs assistance                 General transfer comment: deffered      Balance Overall balance assessment: Needs assistance Sitting-balance support: Feet supported, Bilateral upper extremity supported, Single extremity supported Sitting balance-Leahy Scale: Poor Sitting balance - Comments: lateral lean to R side or posterior most of the times is mod with occaionally CGA with max cues Postural control: Posterior lean, Right lateral lean                                 ADL either performed or assessed with clinical judgement   ADL Overall ADL's : Needs assistance/impaired Eating/Feeding: NPO   Grooming: Wash/dry face;Minimal assistance;Contact guard assist;Sitting   Upper Body Bathing: Maximal assistance;Sitting   Lower Body Bathing: Total assistance;+2 for physical assistance;+2 for safety/equipment;Bed level   Upper Body Dressing : Maximal assistance;Bed level   Lower Body Dressing: Total assistance;+2 for physical assistance;+2 for safety/equipment   Toilet Transfer: Maximal assistance   Toileting- Clothing Manipulation and Hygiene: Total assistance;+2 for physical assistance;+2 for safety/equipment  Vision Patient Visual Report:  (Per husband reported they can not see in their perpheral from prior stroke and unclear on how much they can see as very lethargic in session) Vision Assessment?: Vision impaired-  to be further tested in functional context     Perception         Praxis         Pertinent Vitals/Pain Pain Assessment Pain Assessment: Faces Faces Pain Scale: No hurt     Extremity/Trunk Assessment Upper Extremity Assessment Upper Extremity Assessment: RUE deficits/detail;LUE deficits/detail RUE Deficits / Details: Pt was able to complete AROM to about 80 degress shoulder flexion but unable to maintain the position and touch hand to mouth/face. RUE Coordination: decreased fine motor;decreased gross motor LUE Deficits / Details: No noted muscle contration in session nurse reported they have seen slight digital movement LUE Sensation: decreased light touch;decreased proprioception LUE Coordination: decreased fine motor;decreased gross motor   Lower Extremity Assessment Lower Extremity Assessment: Defer to PT evaluation   Cervical / Trunk Assessment Cervical / Trunk Assessment: Kyphotic   Communication Communication Communication: Impaired Factors Affecting Communication: Trach/intubated   Cognition Arousal: Lethargic Behavior During Therapy: Flat affect Cognition: Cognition impaired   Orientation impairments: Place, Time, Situation Awareness: Online awareness impaired, Intellectual awareness impaired Memory impairment (select all impairments): Short-term memory, Working memory Attention impairment (select first level of impairment): Focused attention Executive functioning impairment (select all impairments): Initiation, Organization, Sequencing, Reasoning, Problem solving                   Following commands: Impaired Following commands impaired: Follows one step commands inconsistently     Cueing  General Comments   Cueing Techniques: Verbal cues;Gestural cues;Tactile cues;Visual cues      Exercises     Shoulder Instructions      Home Living Family/patient expects to be discharged to:: Private residence Living Arrangements: Spouse/significant  other Available Help at Discharge: Family Type of Home: House                              Lives With: Spouse    Prior Functioning/Environment Prior Level of Function : Independent/Modified Independent             Mobility Comments: pt's husband reported they were completly independent      OT Problem List: Decreased strength   OT Treatment/Interventions: Self-care/ADL training;Therapeutic exercise;DME and/or AE instruction;Therapeutic activities;Patient/family education;Balance training      OT Goals(Current goals can be found in the care plan section)   Acute Rehab OT Goals Patient Stated Goal: to get to OOB OT Goal Formulation: With patient Time For Goal Achievement: 03/17/24 Potential to Achieve Goals: Fair   OT Frequency:  Min 1X/week    Co-evaluation PT/OT/SLP Co-Evaluation/Treatment: Yes Reason for Co-Treatment: Complexity of the patient's impairments (multi-system involvement)   OT goals addressed during session: ADL's and self-care      AM-PAC OT 6 Clicks Daily Activity     Outcome Measure Help from another person eating meals?: Total Help from another person taking care of personal grooming?: A Lot Help from another person toileting, which includes using toliet, bedpan, or urinal?: Total Help from another person bathing (including washing, rinsing, drying)?: Total Help from another person to put on and taking off regular upper body clothing?: A Lot Help from another person to put on and taking off regular lower body clothing?: Total 6 Click Score: 8   End of Session Nurse Communication:  Mobility status  Activity Tolerance: Patient limited by fatigue Patient left: in bed;with call bell/phone within reach;with bed alarm set  OT Visit Diagnosis: Unsteadiness on feet (R26.81);Repeated falls (R29.6);Muscle weakness (generalized) (M62.81);Feeding difficulties (R63.3);Low vision, both eyes (H54.2)                Time: 1610-9604 OT Time  Calculation (min): 23 min Charges:  OT General Charges $OT Visit: 1 Visit OT Evaluation $OT Eval Moderate Complexity: 1 Mod  Erving Heather OTR/L  Acute Rehab Services  [phone] office number   Stevphen Elders 03/03/2024, 11:10 AM

## 2024-03-03 NOTE — Progress Notes (Signed)
 PROGRESS NOTE    CHACE BISCH  ZOX:[id number] DOB: [birth year] DOA: 02/25/2024 PCP: [name], MD  Chief Complaint  Patient presents with   having to clear throat   Neck Pain   thyroid  mass    Brief Narrative:   78 y.o. female,  with history of stroke with residual left hemianopsia, hypertension and type 2 diabetes, history of CAD with NSTEMI March 2024 status post stent placement, patient has been recently followed by Dr. Abraham Abo on 6/3 for surgery workup for Ziaire Hagos thyroid  mass, has been cleared by cardiology 6/4 to undergo surgery, patient presents to ED as she has been having difficulty clearing her throat, and worsening neck pain, [facility] to [facility] for evaluation by general surgery, upon [facility] kids evaluation/10, he recommended ENT involvement, patient was seen by Dr. Bobetta Burrows with plan for tracheostomy 6/11, and open thyroid  biopsy for definitive histologic diagnosis, medical oncology, radiation oncology has been involved as well, as well Dr. Bobetta Burrows in communication with ENT at [facility]   Assessment & Plan:   Principal Problem:   Neck mass Active Problems:   Hyperlipidemia   GERD (gastroesophageal reflux disease)   Cerebral infarction (HCC)   Uncontrolled type 1 diabetes mellitus with hyperglycemia (HCC)   NSTEMI (non-ST elevated myocardial infarction) (HCC)   Paroxysmal atrial fibrillation (HCC)   CAD (coronary artery disease)   Anaplastic thyroid  carcinoma (HCC)   Hoarseness   Thyroid  cancer (HCC)   Tracheostomy status (HCC)  Acute Stroke MRI pending CTA head/neck notable for abrupt cut off with functional occlusion of the cavernous right ICA at the level of the anterior genu.  Distal reconstitution via colateral flow eith via the ophthalmic artery or cross the circle of willis.  Favored to reflect severe high grade progressive stenosis (see report).  43 ml area delayed prefusion involving R cerebral hemisphere, somewhat watershed in distribution.   Atheromatous change about the L carotid siphon with resultant moderate to severe stenosis at the para clinoid region.  Diffusely irregular and attenuated R PCA.  Echo with normal EF (see report)  Appreciate neurology assistance, related to severe stenosis or near occlusion of R ICA.   A1c 12/2023 6.9, LDL 24 Ok per ENT to start DAPT.  Crestor .   PT/OT/SLP - currently NPO, ok for ice chips and floor stock nectar thick liquids and/or puree solids   Right anterior neck/thyroid  mass concerning for thyroid  malignancy. - Rapidly enlarging right thyroid  malignancy, concerning for aggressive thyroid  cancer, likely anaplastic - now s/p open thyroid  biopsy and tracheostomy -> changed to uncuffed tube per Dr. [name] - surgical pathology pending from 6/11 - CT chest abdomen pelvis without metastatic disease visualized - CT neck with irregular peripherally enhancing centrally hypodense lesion within the right thyroid  fossa with associated mild right cervical lymphadenopathy.  LN's centrally hypodense (suppurative or necrotic) (concerning for metastatic thyroid  cancer - see report) - With further recommendations after her surgery today and definitive histologic diagnosis, I will await further recommendation from radiation oncology and oncology pending biopsy results, possible need to transfer to [facility] for radiation and chemotherapy. - cortrak (NG switched to cortrak today), getting tube feeds - SLP to evaluate    History of stroke with left-sided hemianopsia.  - Continue with aspirin , plavix  and statin - recurrent stroke as above   CAD s/p PCI in March 2024.  - Has seen her cardiologist and was cleared to undergo surgical procedure on 02/22/2024, Plavix  on hold continue statin.  History of 1 episode of Tori Cupps-fib in 2024.  - This was in conjunction of her NSTEMI.  Not on anticoagulation, follows with her cardiologist, continue low-dose Cardizem .   Dyslipidemia.  Continue combination of statin  and Zetia.   Hypomagnesemia.  Replaced.     Hypokalemia.  Placed   Essential hypertension.  On Cardizem .   DM type II  A1c 12/2023 6.9  Lantus  increase to 15 units, SSI - adjust as needed  Glaucoma Timolol      DVT prophylaxis: heparin   Code Status: full Family Communication: daughter at bedside Disposition:   Status is: Inpatient Remains inpatient appropriate because: need for inpatient care   Consultants:  ENT General Surgery  Procedures:  6/11 PROCEDURE PERFORMED:   Tracheostomy Open biopsy of the right thyroid  cancer  Echo IMPRESSIONS     1. Left ventricular ejection fraction, by estimation, is 55 to 60%. The  left ventricle has normal function. The left ventricle has no regional  wall motion abnormalities. Left ventricular diastolic parameters were  normal.   2. Right ventricular systolic function is normal. The right ventricular  size is normal. Tricuspid regurgitation signal is inadequate for assessing  PA pressure.   3. The mitral valve is normal in structure. Trivial mitral valve  regurgitation.   4. The aortic valve was not well visualized. Aortic valve regurgitation  is not visualized. No aortic stenosis is present.   5. The inferior vena cava is dilated in size with >50% respiratory  variability, suggesting right atrial pressure of 8 mmHg.   Antimicrobials:  Anti-infectives (From admission, onward)    Start     Dose/Rate Route Frequency Ordered Stop   02/29/24 1000  clindamycin  (CLEOCIN ) IVPB 900 mg        900 mg 100 mL/hr over 30 Minutes Intravenous  Once 02/29/24 0955 02/29/24 1106   02/29/24 0957  clindamycin  (CLEOCIN ) 900 MG/50ML IVPB       Note to Pharmacy: Clydell Darnel E: cabinet override      02/29/24 0957 02/29/24 1111       Subjective: Sleepy today - got morphine Alexandra Ang overnight,was knocked out this AM She's not having any pain Was anxious last night, couldn't sleep  Objective: Vitals:   03/03/24 1228 03/03/24 1338 03/03/24  1556 03/03/24 1635  BP: 130/67 (!) 123/47 (!) 153/58   Pulse: 92 77 92   Resp: 18     Temp: 99.1 F (37.3 C)     TempSrc: Oral     SpO2: 99% 98% 98% 98%  Weight:      Height:        Intake/Output Summary (Last 24 hours) at 03/03/2024 1836 Last data filed at 03/03/2024 1719 Gross per 24 hour  Intake 1194 ml  Output 900 ml  Net 294 ml   Filed Weights   03/01/24 0500 03/02/24 1410 03/03/24 0442  Weight: 55.6 kg 53.7 kg 58.2 kg    Examination:  General: No acute distress. Cardiovascular: RRR Lungs: trach - lots of secretions Abdomen: Soft, nontender, nondistended - cortrak Neurological: communicates via writing and speaking (once PMV in place), L sided weakness Extremities: No clubbing or cyanosis. No edema.  Data Reviewed: I have personally reviewed following labs and imaging studies  CBC: Recent Labs  Lab 02/27/24 0315 02/28/24 0513 02/29/24 0534 03/01/24 0400 03/02/24 0836 03/03/24 1019  WBC 11.5* 11.6* 8.4 16.0* 17.3* 13.3*  NEUTROABS 8.0* 8.5* 5.8 14.0* 13.5*  --   HGB 9.1* 9.4* 8.6* 9.5* 10.0* 9.9*  HCT 28.0* 28.7* 26.0* 29.3*  30.8* 31.7*  MCV 86.2 85.9 85.2 86.2 85.1 86.6  PLT 282 295 295 350 423* 376    Basic Metabolic Panel: Recent Labs  Lab 02/26/24 0548 02/27/24 0315 02/28/24 0513 02/29/24 0534 03/01/24 0400 03/02/24 0836 03/03/24 1019  NA 135 137 138 139 136 136 133*  K 4.0 3.8 3.6 3.3* 4.1 4.0 4.0  CL 103 105 104 107 104 101 101  CO2 25 25 24 25 22 25 23   GLUCOSE 127* 115* 126* 102* 227* 137* 326*  BUN 13 9 8  7* 21 18 27*  CREATININE 0.79 0.93 0.78 0.77 0.78 0.73 0.90  CALCIUM  8.7* 9.0 8.6* 8.9 8.9 8.9 8.9  MG 1.6* 1.6* 2.1 1.8 2.1  --   --   PHOS  --  2.5 2.9 3.3 3.7  --   --     GFR: Estimated Creatinine Clearance: 41.2 mL/min (by C-G formula based on SCr of 0.9 mg/dL).  Liver Function Tests: No results for input(s): AST, ALT, ALKPHOS, BILITOT, PROT, ALBUMIN in the last 168 hours.  CBG: Recent Labs  Lab  03/03/24 0004 03/03/24 0438 03/03/24 0750 03/03/24 1143 03/03/24 1557  GLUCAP 303* 373* 254* 352* 301*     No results found for this or any previous visit (from the past 240 hours).       Radiology Studies: DG Swallowing Func-Speech Pathology Result Date: 03/03/2024 Table formatting from the original result was not included. Modified Barium Swallow Study Patient Details Name: ANJALEE COPE MRN: [id number] Date of Birth: 19-Jan-1946 Today's Date: 03/03/2024 HPI/PMH: HPI: 78 yo female presenting 6/7 with rapidly enlarging thyroid  mass. CT Neck showed 4.1 x 2.5 x 3.9 cm heterogeneous mass in the right anterior neck at  the level of the thyroid  concerning for residual/recurrent thyroid  malignancy. Associated mass effect and leftward shift of the airway without significant airway narrowing. 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 Associated retropharyngeal edema. Trach done by ENT 6/11. Code stroke was called 6/13 due to L sided weakness although time of onset likely the day prior. BS completed on 6/12 recommending Dys 2, nectar thick liquids but patient the next day, was having difficulty swallowing and had new onset stroke symptoms. MRI showed Patchy acute ischemic nonhemorrhagic right frontal, parietal, and occipital infarcts, watershed in distribution. Patient made NPO awaiting reevaluation of swallowing with repeat MBS recommended. Large bore NG changed to small bore. PMH includes: GERD, stroke, Barrett's esophagus with esophageal dysphagia s/p dilation, HLD, NSTEMI, PAF, CAD, HTN Clinical Impression: Clinical Impression: As compared to MBS completed on 03/01/24, MBS completed today appeared only mildly different. She continues with limited anterior hyoid movement, reduced laryngeal elevation and incomplete laryngeal vestibule closure. This leads to penetration of thin liquids and nectar thick  liquids to level of vocal cords before spontaneously clearing from laryngeal vestibule (PAS 4) as well as an instance each of thin and nectar thick liquid material passing below the vocal cords before spontaneously clearing from laryngeal vestibule (PAS 6). Silent aspiration was observed with thin liquids. (PAS 8) The main difference in today's MBS as compared to MBS on 6/12 was increased delay in transit of puree solid and honey and nectar thick liquid boluses as they transited through pharynx and through PES. Patient was noticeably tired at beginning of study and did appear to become more fatigued as study progressed, at one point leaning her head against fluoroscopy machine. SLP is recommending continue NPO but allow to continue  PRN ice chips as well as floor stock purees/puddings and floor stock nectar thick liquids. SLP will follow for full PO readiness. Factors that may increase risk of adverse event in presence of aspiration Roderick Civatte & Jessy Morocco 2021): Factors that may increase risk of adverse event in presence of aspiration Roderick Civatte & Jessy Morocco 2021): Weak cough; Presence of tubes (ETT, trach, NG, etc.); Frail or deconditioned Recommendations/Plan: Swallowing Evaluation Recommendations Swallowing Evaluation Recommendations Recommendations: NPO; Ice chips PRN after oral care Medication Administration: Crushed with puree Supervision: Patient able to self-feed; Staff to assist with self-feeding; Full supervision/cueing for swallowing strategies Swallowing strategies  : Place PMSV during PO intake; Slow rate; Small bites/sips Postural changes: Position pt fully upright for meals Oral care recommendations: Oral care BID (2x/day) Treatment Plan Treatment Plan Treatment recommendations: Therapy as outlined in treatment plan below Follow-up recommendations: Outpatient SLP Functional status assessment: Patient has had Keora Eccleston recent decline in their functional status and demonstrates the ability to make significant improvements  in function in Kiylee Thoreson reasonable and predictable amount of time. Treatment frequency: Min 2x/week Treatment duration: 2 weeks Interventions: Aspiration precaution training; Compensatory techniques; Patient/family education; Trials of upgraded texture/liquids; Diet toleration management by SLP; Respiratory muscle strength training Recommendations Recommendations for follow up therapy are one component of Brondon Wann multi-disciplinary discharge planning process, led by the attending physician.  Recommendations may be updated based on patient status, additional functional criteria and insurance authorization. Assessment: Orofacial Exam: Orofacial Exam Oral Cavity: Oral Hygiene: WFL Oral Cavity - Dentition: Adequate natural dentition Orofacial Anatomy: WFL Oral Motor/Sensory Function: WFL Anatomy: Anatomy: Suspected cervical osteophytes Boluses Administered: Boluses Administered Boluses Administered: Thin liquids (Level 0); Mildly thick liquids (Level 2, nectar thick); Moderately thick liquids (Level 3, honey thick); Puree  Oral Impairment Domain: Oral Impairment Domain Lip Closure: No labial escape Tongue control during bolus hold: Cohesive bolus between tongue to palatal seal Bolus preparation/mastication: Timely and efficient chewing and mashing Bolus transport/lingual motion: Brisk tongue motion Oral residue: Complete oral clearance Location of oral residue : N/Genevieve Ritzel Initiation of pharyngeal swallow : Valleculae  Pharyngeal Impairment Domain: Pharyngeal Impairment Domain Soft palate elevation: No bolus between soft palate (SP)/pharyngeal wall (PW) Laryngeal elevation: Partial superior movement of thyroid  cartilage/partial approximation of arytenoids to epiglottic petiole Anterior hyoid excursion: Partial anterior movement Epiglottic movement: No inversion Laryngeal vestibule closure: Incomplete, narrow column air/contrast in laryngeal vestibule Pharyngeal stripping wave : Present - complete Pharyngeal contraction (Alvy Alsop/P view only): N/Lisandro Meggett  Pharyngoesophageal segment opening: Complete distension and complete duration, no obstruction of flow Tongue base retraction: Trace column of contrast or air between tongue base and PPW Pharyngeal residue: Collection of residue within or on pharyngeal structures Location of pharyngeal residue: Pyriform sinuses; Valleculae  Esophageal Impairment Domain: Esophageal Impairment Domain Esophageal clearance upright position: Complete clearance, esophageal coating Pill: No data recorded Penetration/Aspiration Scale Score: Penetration/Aspiration Scale Score 1.  Material does not enter airway: Puree 2.  Material enters airway, remains ABOVE vocal cords then ejected out: Moderately thick liquids (Level 3, honey thick) 3.  Material enters airway, remains ABOVE vocal cords and not ejected out: Mildly thick liquids (Level 2, nectar thick) 4.  Material enters airway, CONTACTS cords then ejected out: Thin liquids (Level 0); Mildly thick liquids (Level 2, nectar thick) 6.  Material enters airway, passes BELOW cords then ejected out: Thin liquids (Level 0); Mildly thick liquids (Level 2, nectar thick) 8.  Material enters airway, passes BELOW cords without attempt by patient to eject out (silent aspiration) : Thin liquids (Level 0) Compensatory Strategies: Compensatory  Strategies Compensatory strategies: Yes Right head turn: Ineffective Ineffective Right Head Turn: Thin liquid (Level 0); Mildly thick liquid (Level 2, nectar thick)   General Information: Caregiver present: Yes  Diet Prior to this Study: NPO; Cortrak/Small bore NG tube   No data recorded  Respiratory Status: WFL   No data recorded  History of Recent Intubation: Yes  Behavior/Cognition: Alert; Cooperative; Pleasant mood; Lethargic/Drowsy Self-Feeding Abilities: Able to self-feed Baseline vocal quality/speech: Dysphonic Volitional Cough: Able to elicit Volitional Swallow: Able to elicit Exam Limitations: No limitations Goal Planning: Prognosis for improved oropharyngeal  function: Good Barriers to Reach Goals: Overall medical prognosis No data recorded Patient/Family Stated Goal: patient requesting ice chips Consulted and agree with results and recommendations: Patient; Family member/caregiver; Nurse Pain: Pain Assessment Pain Assessment: Faces Faces Pain Scale: 2 Pain Location: burning in throat from coughing Pain Descriptors / Indicators: Burning Pain Intervention(s): Monitored during session End of Session: Start Time:SLP Start Time (ACUTE ONLY): 1255 Stop Time: SLP Stop Time (ACUTE ONLY): 1315 Time Calculation:SLP Time Calculation (min) (ACUTE ONLY): 20 min Charges: SLP Evaluations $ SLP Speech Visit: 1 Visit SLP Evaluations $MBS Swallow: 1 Procedure $ SLP EVAL LANGUAGE/SOUND PRODUCTION: 1 Procedure $Swallowing Treatment: 1 Procedure SLP visit diagnosis: SLP Visit Diagnosis: Dysphagia, oropharyngeal phase (R13.12) Past Medical History: Past Medical History: Diagnosis Date  Arthritis   CAD (coronary artery disease) 03/28/2023  NSTEMI 11/2022 s/p 3 x 16 mm DES to the [facility], 3 x 20 mm DES to the mRCA   - LHC 12/13/2022: RCA ostial 95, mid 90; LCx mid 70; LAD mid 40, distal 70>> Med Rx for LAD, LCx   - TTE 12/09/2022: EF 50-55, no RWMA, mild concentric LVH, GR 2 DD, normal RVSF, mild MR, RAP 15  Diabetes mellitus   since 2008  Essential hypertension, benign 09/10/2015  GERD (gastroesophageal reflux disease)   Hypercholesteremia   Hyperlipidemia   Stroke (HCC) 03/21/2011  only deficits-affected eyesight Past Surgical History: Past Surgical History: Procedure Laterality Date  BIOPSY  07/02/2022  Procedure: BIOPSY;  Surgeon: Urban Garden, MD;  Location: [facility] ENDO SUITE;  Service: Gastroenterology;;  CATARACT EXTRACTION W/PHACO  11/01/2011  Procedure: CATARACT EXTRACTION PHACO AND INTRAOCULAR LENS PLACEMENT (IOC);  Surgeon: Anner Kill, MD;  Location: [facility] ORS;  Service: Ophthalmology;  Laterality: Right;  CDE 12.48  CORONARY STENT INTERVENTION N/Brittain Hosie 12/10/2022  Procedure: CORONARY  STENT INTERVENTION;  Surgeon: Arnoldo Lapping, MD;  Location: [facility] INVASIVE CV LAB;  Service: Cardiovascular;  Laterality: N/Kyasia Steuck;  ESOPHAGEAL DILATION N/Margy Sumler 04/29/2017  Procedure: ESOPHAGEAL DILATION;  Surgeon: Ruby Corporal, MD;  Location: [facility] ENDO SUITE;  Service: Endoscopy;  Laterality: N/Adreanne Yono;  ESOPHAGOGASTRODUODENOSCOPY N/Kieth Hartis 04/29/2017  Procedure: ESOPHAGOGASTRODUODENOSCOPY (EGD);  Surgeon: Ruby Corporal, MD;  Location: [facility] END[facility];  Service: Endoscopy;  Laterality: N/Tylek Boney;  1015  ESOPHAGOGASTRODUODENOSCOPY (EGD) WITH ESOPHAGEAL DILATION N/Miu Chiong 02/14/2013  Procedure: ESOPHAGOGASTRODUODENOSCOPY (EGD) WITH ESOPHAGEAL DILATION;  Surgeon: Ruby Corporal, MD;  Location: [facility];  Service: Endoscopy;  Laterality: N/Hero Kulish;  200-moved to 255 Ann to notify pt  ESOPHAGOGASTRODUODENOSCOPY (EGD) WITH PROPOFOL  N/Harshith Pursell 05/28/2022  Procedure: ESOPHAGOGASTRODUODENOSCOPY (EGD) WITH PROPOFOL ;  Surgeon: Urban Garden, MD;  Location: [facility];  Service: Gastroenterology;  Laterality: N/Paislie Tessler;  1030 ASA 2  ESOPHAGOGASTRODUODENOSCOPY (EGD) WITH PROPOFOL  N/Walaa Carel 07/02/2022  Procedure: ESOPHAGOGASTRODUODENOSCOPY (EGD) WITH PROPOFOL ;  Surgeon: Urban Garden, MD;  Location: [facility];  Service: Gastroenterology;  Laterality: N/Macklen Wilhoite;  105 ASA 2, pt knows new time per Hugo Maes  EYE SURGERY  10-2010  left eye  removal of cataract  LEFT HEART CATH AND CORONARY ANGIOGRAPHY N/Obi Scrima 12/10/2022  Procedure: LEFT HEART CATH AND CORONARY ANGIOGRAPHY;  Surgeon: Arnoldo Lapping, MD;  Location: [facility] INVASIVE CV LAB;  Service: Cardiovascular;  Laterality: N/Patrcia Schnepp;  MASS BIOPSY N/Jesusa Stenerson 02/29/2024  Procedure: BIOPSY, THYROID ;  Surgeon: Reynold Caves, MD;  Location: [facility] OR;  Service: ENT;  Laterality: N/Raneshia Derick;  TRACHEOSTOMY TUBE PLACEMENT N/Danile Trier 02/29/2024  Procedure: CREATION, TRACHEOSTOMY;  Surgeon: Reynold Caves, MD;  Location: [facility] OR;  Service: ENT;  Laterality: N/Doretta Remmert;  WITH THYROID  BIOPSY  TUBAL LIGATION  1984 Jacqualine Mater, MA, CCC-SLP Speech Therapy   MR BRAIN WO  CONTRAST Result Date: 03/02/2024 CLINICAL DATA:  Follow-up examination for acute or deficit, stroke. EXAM: MRI HEAD WITHOUT CONTRAST TECHNIQUE: Multiplanar, multiecho pulse sequences of the brain and surrounding structures were obtained without intravenous contrast. COMPARISON:  Comparison made with CTs from earlier the same day. FINDINGS: Brain: Cerebral volume within normal limits. Encephalomalacia and gliosis involving the right occipital lobe, consistent with Hannah Crill chronic right PCA distribution infarct. Patchy restricted diffusion involving the cortical to subcortical aspect of the right frontal, parietal, and occipital lobes, consistent with acute ischemic nonhemorrhagic infarcts. These are watershed in distribution. No associated hemorrhage or significant mass effect. No other evidence for acute or subacute ischemia. No acute or chronic intracranial blood products. No mass lesion, midline shift or mass effect. Ex vacuo dilatation of the right lateral ventricle related to the chronic right PCA territory infarct without hydrocephalus. No extra-axial fluid collection. Pituitary gland within normal limits. Vascular: Loss of normal flow void within the cavernous right ICA related to the previously identified focal occlusion versus high-grade stenosis (series 5, image 8). Major intracranial vascular flow voids are otherwise maintained. Skull and upper cervical spine: Cranial junction within normal limits. Bone marrow signal intensity normal. No scalp soft tissue abnormality. Sinuses/Orbits: Prior bilateral ocular lens replacement. Paranasal sinuses are largely clear. No mastoid effusion. Other: None. IMPRESSION: 1. Patchy acute ischemic nonhemorrhagic right frontal, parietal, and occipital infarcts, watershed in distribution. 2. Chronic right PCA distribution infarct. 3. Loss of normal flow void within the cavernous right ICA related to the previously identified focal occlusion versus high-grade stenosis.  Electronically Signed   By: Virgia Griffins M.D.   On: 03/02/2024 19:08   ECHOCARDIOGRAM COMPLETE Result Date: 03/02/2024    ECHOCARDIOGRAM REPORT   Patient Name:   EDWYNA DANGERFIELD Date of Exam: 03/02/2024 Medical Rec #:  [id number]      Height:       60.0 in Accession #:    [id number]     Weight:       122.6 lb Date of Birth:  Mar 07, 1946      BSA:          1.516 m Patient Age:    78 years       BP:           175/60 mmHg Patient Gender: F              HR:           81 bpm. Exam Location:  Inpatient Procedure: 2D Echo, Color Doppler and Cardiac Doppler (Both Spectral and Color            Flow Doppler were utilized during procedure). Indications:    Stroke  History:        Patient has prior history of Echocardiogram examinations, most                 recent  12/09/2022. NSTEMI.  Sonographer:    Jeralene Mom Referring Phys: 1 ARSHAD N KAKRAKANDY IMPRESSIONS  1. Left ventricular ejection fraction, by estimation, is 55 to 60%. The left ventricle has normal function. The left ventricle has no regional wall motion abnormalities. Left ventricular diastolic parameters were normal.  2. Right ventricular systolic function is normal. The right ventricular size is normal. Tricuspid regurgitation signal is inadequate for assessing PA pressure.  3. The mitral valve is normal in structure. Trivial mitral valve regurgitation.  4. The aortic valve was not well visualized. Aortic valve regurgitation is not visualized. No aortic stenosis is present.  5. The inferior vena cava is dilated in size with >50% respiratory variability, suggesting right atrial pressure of 8 mmHg. FINDINGS  Left Ventricle: Left ventricular ejection fraction, by estimation, is 55 to 60%. The left ventricle has normal function. The left ventricle has no regional wall motion abnormalities. The left ventricular internal cavity size was normal in size. There is  no left ventricular hypertrophy. Left ventricular diastolic parameters were normal. Right  Ventricle: The right ventricular size is normal. No increase in right ventricular wall thickness. Right ventricular systolic function is normal. Tricuspid regurgitation signal is inadequate for assessing PA pressure. Left Atrium: Left atrial size was normal in size. Right Atrium: Right atrial size was normal in size. Pericardium: There is no evidence of pericardial effusion. Mitral Valve: The mitral valve is normal in structure. Trivial mitral valve regurgitation. Tricuspid Valve: The tricuspid valve is normal in structure. Tricuspid valve regurgitation is trivial. Aortic Valve: The aortic valve was not well visualized. Aortic valve regurgitation is not visualized. No aortic stenosis is present. Aortic valve mean gradient measures 4.0 mmHg. Aortic valve peak gradient measures 7.3 mmHg. Aortic valve area, by VTI measures 2.00 cm. Pulmonic Valve: The pulmonic valve was not well visualized. Pulmonic valve regurgitation is not visualized. Aorta: The aortic root is normal in size and structure. Venous: The inferior vena cava is dilated in size with greater than 50% respiratory variability, suggesting right atrial pressure of 8 mmHg. IAS/Shunts: The interatrial septum was not well visualized.  LEFT VENTRICLE PLAX 2D LVIDd:         3.97 cm   Diastology LVIDs:         3.15 cm   LV e' medial:    9.57 cm/s LV PW:         0.83 cm   LV E/e' medial:  10.6 LV IVS:        0.83 cm   LV e' lateral:   13.30 cm/s LVOT diam:     1.70 cm   LV E/e' lateral: 7.6 LV SV:         54 LV SV Index:   36 LVOT Area:     2.27 cm  RIGHT VENTRICLE RV Basal diam:  3.10 cm RV Mid diam:    2.80 cm RV S prime:     11.70 cm/s TAPSE (M-mode): 2.1 cm LEFT ATRIUM             Index        RIGHT ATRIUM           Index LA diam:        3.20 cm 2.11 cm/m   RA Area:     12.00 cm LA Vol (A2C):   43.5 ml 28.69 ml/m  RA Volume:   26.10 ml  17.22 ml/m LA Vol (A4C):   30.6 ml 20.19 ml/m LA Biplane Vol: 36.8 ml 24.28 ml/m  AORTIC VALVE AV Area (Vmax):    1.80  cm AV Area (Vmean):   1.87 cm AV Area (VTI):     2.00 cm AV Vmax:           135.00 cm/s AV Vmean:          89.800 cm/s AV VTI:            0.270 m AV Peak Grad:      7.3 mmHg AV Mean Grad:      4.0 mmHg LVOT Vmax:         107.00 cm/s LVOT Vmean:        73.900 cm/s LVOT VTI:          0.238 m LVOT/AV VTI ratio: 0.88  AORTA Ao Root diam: 2.40 cm MITRAL VALVE MV Area (PHT): 4.31 cm     SHUNTS MV Decel Time: 176 msec     Systemic VTI:  0.24 m MV E velocity: 101.00 cm/s  Systemic Diam: 1.70 cm MV Page Pucciarelli velocity: 128.00 cm/s MV E/Breahna Boylen ratio:  0.79 Carson Clara MD Electronically signed by Carson Clara MD Signature Date/Time: 03/02/2024/11:11:31 AM    Final    CT ANGIO HEAD NECK W WO CM W PERF (CODE STROKE) Result Date: 03/02/2024 CLINICAL DATA:  Initial evaluation for acute neuro deficit. History of thyroid  cancer. EXAM: CT ANGIOGRAPHY HEAD AND NECK CT PERFUSION BRAIN TECHNIQUE: Multidetector CT imaging of the head and neck was performed using the standard protocol during bolus administration of intravenous contrast. Multiplanar CT image reconstructions and MIPs were obtained to evaluate the vascular anatomy. Carotid stenosis measurements (when applicable) are obtained utilizing NASCET criteria, using the distal internal carotid diameter as the denominator. Multiphase CT imaging of the brain was performed following IV bolus contrast injection. Subsequent parametric perfusion maps were calculated using RAPID software. RADIATION DOSE REDUCTION: This exam was performed according to the departmental dose-optimization program which includes automated exposure control, adjustment of the mA and/or kV according to patient size and/or use of iterative reconstruction technique. CONTRAST:  OMNIPAQUE  IOHEXOL  350 MG/ML SOLN COMPARISON:  Comparison made with CT from earlier the same day as well as prior neck CT from 02/28/2024. FINDINGS: CTA NECK FINDINGS Aortic arch: Visualized aortic arch within normal limits for  caliber with standard 3 vessel morphology. Moderate aortic atherosclerosis. No significant stenosis about the origin the great vessels. Right carotid system: Right common and internal carotid arteries are patent without dissection. Atheromatous change about the right carotid bulb without hemodynamically significant greater than 50% stenosis. Left carotid system: Left common and internal carotid arteries are patent without dissection. No hemodynamically significant stenosis about the left carotid artery system. Vertebral arteries: Both vertebral arteries arise from subclavian arteries. Vertebral arteries patent without stenosis or dissection. Skeleton: No worrisome osseous lesions. Mild-to-moderate multilevel spondylosis noted within the visualized cervicothoracic spine. Other neck: Nasogastric tube in place. Heterogeneous enhancing ill-defined mass positioned at the right thyroid  fossa again seen, measuring approximately 3.9 x 2.8 x 3.2 cm. Multiple abnormal and partially necrotic nodes within the adjacent left neck, concerning for nodal metastatic disease. Changes are relatively similar as compared to recent neck CT. Interval placement of Rayshawn Visconti tracheostomy with tip well positioned in the upper airway. Small volume secretions noted within subglottic trachea distal to the tracheostomy tube. Upper chest: No other acute finding. Review of the MIP images confirms the above findings CTA HEAD FINDINGS Anterior circulation: Atheromatous change seen about the carotid siphons bilaterally. On the right, there is abrupt cut off of  the cavernous right ICA at the level of the anterior genu (series 10, images 103-95). The right ICA is functionally occluded at this level. Distal reconstitution via collateral flow either via the ophthalmic artery or cross the circle-of-Willis. Upon review of prior MRA from 2012, there appears to have been Tobey Lippard moderate to severe stenosis at this location. Therefore, finding is favored to reflect Oktober Glazer severe  high-grade progressive stenosis. Acute superimposed intraluminal thrombus is difficult to exclude. On the left, atheromatous change about the left siphon with resultant moderate to severe stenosis at the para clinoid region (series 10, image 92). A1 segments are diminutive but patent. Anterior communicating artery complex within normal limits. Both ACAs patent to their distal aspects without stenosis. Right ACA dominant. M1 segments patent without stenosis. No proximal SCA branch occlusion. Distal MCA branches perfused and fairly symmetric. Posterior circulation: Both V4 segments patent without stenosis. Left vertebral artery dominant. Both PICA patent. Basilar patent without stenosis. Superior cerebral arteries patent bilaterally. Left PCA primarily supplied via the basilar. Atheromatous change about the left PCA with moderate proximal left P2 stenosis (series 18, image 21). The right PCA is diffusely irregular and attenuated, in keeping with the chronic right PCA territory infarct. Right PCA remains patent to its distal aspect. Venous sinuses: Patent allowing for timing the contrast bolus. Anatomic variants: As above. No visible aneurysm. Review of the MIP images confirms the above findings CT Brain Perfusion Findings: ASPECTS: 10 CBF (<30%) Volume: 0mL Perfusion (Tmax>6.0s) volume: 43mL Mismatch Volume: 43 mL Infarction Location:43 mL areas of delayed perfusion involving the right cerebral hemisphere, somewhat watershed distribution. Finding presumably related to functional occlusion/severe stenosis at the cavernous right ICA as detailed above. No acute core infarct. No other perfusion abnormality IMPRESSION: 1. 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 Distal reconstitution via collateral flow either via the ophthalmic artery or cross the circle-of-Willis. Upon review of prior MRA from 2012, there appears to have been Tieasha Larsen moderate to severe stenosis at  this location. Therefore, this finding is favored to reflect Earlee Herald severe high-grade progressive stenosis although acutely superimposed intraluminal thrombus is difficult to exclude. 2. 43 mL areas of delayed perfusion involving the right cerebral hemisphere, somewhat watershed and distribution. Finding is in keeping with the above described high-grade stenosis/occlusion. No acute core infarct by CT perfusion. 3. 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 4. Diffusely irregular and attenuated right PCA, in keeping with the chronic right PCA territory infarct. 5. Heterogeneous enhancing mass at the right thyroid  fossa, presumably reflecting patient's known thyroid  carcinoma. Scattered partially necrotic lymph nodes within the adjacent right neck, concerning for nodal metastatic disease. Changes are relatively similar as compared to recent neck CT from 02/28/2024. 6. Interval placement of Zion Lint tracheostomy tube with tip well positioned in the upper airway. 7.  Aortic Atherosclerosis (ICD10-I70.0). Results discussed by telephone at the time of interpretation on 03/02/2024 at 1:35 Jet [email or web address]. to provider ASHISH ARORA. Electronically Signed   By: Virgia Griffins M.D.   On: 03/02/2024 02:05   CT HEAD CODE STROKE WO CONTRAST Result Date: 03/02/2024 CLINICAL DATA:  Code stroke. Initial evaluation for acute neuro deficit, stroke suspected. EXAM: CT HEAD WITHOUT CONTRAST TECHNIQUE: Contiguous axial images were obtained from the base of the skull through the vertex without intravenous contrast. RADIATION DOSE REDUCTION: This exam was performed according to the departmental dose-optimization program which includes automated exposure control,  adjustment of the mA and/or kV according to patient size and/or use of iterative reconstruction technique. COMPARISON:  None Available. FINDINGS: Brain: Cerebral volume within normal limits. Encephalomalacia involving  the right occipital lobe, consistent with Katrece Roediger chronic right PCA distribution infarct. No acute intracranial hemorrhage. No acute large vessel territory infarct. No mass lesion or midline shift. No hydrocephalus or extra-axial fluid collection. Vascular: No abnormal hyperdense vessel. Calcified atherosclerosis present at the skull base. Skull: Scalp soft tissues within normal limits.  Calvarium intact. Sinuses/Orbits: Globes orbital soft tissues demonstrate no acute finding. Paranasal sinuses are largely clear. No significant mastoid effusion. Nasogastric tube in place. Other: None. ASPECTS [facility] Stroke Program Early CT Score) - Ganglionic level infarction (caudate, lentiform nuclei, internal capsule, insula, M1-M3 cortex): 7 - Supraganglionic infarction (M4-M6 cortex): 3 Total score (0-10 with 10 being normal): 10 IMPRESSION: 1. No acute intracranial abnormality. 2. ASPECTS is 10. 3. Chronic right PCA territory infarct. These results were communicated to Dr. Arora at 1:00 am on 03/02/2024 by text page via the [facility] messaging system. Electronically Signed   By: Virgia Griffins M.D.   On: 03/02/2024 01:01        Scheduled Meds:  aspirin   81 mg Per Tube Daily   clopidogrel   75 mg Oral Daily   diltiazem   30 mg Per Tube BID   famotidine   40 mg Per Tube Daily   feeding supplement (PROSource TF20)  60 mL Per Tube Daily   free water   100 mL Per Tube Q4H   heparin   5,000 Units Subcutaneous Q8H   insulin  aspart  0-15 Units Subcutaneous Q4H   insulin  glargine-yfgn  12 Units Subcutaneous QHS   latanoprost   1 drop Both Eyes QHS   multivitamin with minerals  1 tablet Per Tube Daily   mouth rinse  15 mL Mouth Rinse 4 times per day   polyethylene glycol  17 g Per Tube Daily   rosuvastatin   40 mg Per Tube Daily   timolol   1 drop Both Eyes BID   Continuous Infusions:  feeding supplement (OSMOLITE 1.5 CAL) 45 mL/hr at 03/03/24 1719     LOS: 7 days    Time spent: over 30 min    Donnetta Gains,  MD [facility]   To contact the attending provider between 7A-7P or the covering provider during after hours 7P-7A, please log into the web site [email or web address] and access using universal  password for that web site. If you do not have the password, please call the hospital operator.  03/03/2024, 6:36 PM

## 2024-03-03 NOTE — Plan of Care (Signed)

## 2024-03-03 NOTE — Evaluation (Signed)
 Physical Therapy Evaluation Patient Details Name: Nancy Sutton MRN: [id number] DOB: 02-May-1946 Today's Date: 03/03/2024  History of Present Illness  78 yo female presents to [facility] 6/7 with SOB. 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 S/p trach 6/10. 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 PMH includes CVA with residual L hemianopsia, HTN, DMII, CAD s/p NSTEMI and stenting 2024, OA.  Clinical Impression  At baseline, patient lives with spouse and completed mobility/ADLs independently. Patient presents today limited by lethargy. RN reported patient has been lethargic since receiving pain medicine this AM. Patient demonstrates significant strength deficits L hemibody, impaired activity tolerance, and poor seated balance. Demonstrated AROM only at Left toes this evaluation. Despite lethargy, patient agreeable to sit EOB today with husband reporting patient wanting to get up yesterday. Patient requires +2 maxA for supine <> sit transfer. At EOB, patient required modA for trunk stability. Able to follow cues for postural awareness to correct L lateral trunk lean, however required consistency to maintain arousal. Tolerated ~10 minutes at EOB before requiring increased assistance for trunk stability, returned to supine. Patient repositioned toward [facility] with UE elevated and heels floating. Patient will continue to benefit from skilled acute PT services in order to address the above impairments. Recommend post acute rehab > 3 hours/day with anticipation of improved alertness for increasing activity tolerance.       If plan is discharge home, recommend the following: Two people to help with walking and/or transfers;Two people to help with bathing/dressing/bathroom;Assistance with feeding;Assistance with cooking/housework;Direct supervision/assist for  medications management;Assist for transportation;Supervision due to cognitive status   Can travel by private vehicle        Equipment Recommendations Other (comment) (Will continue to assess, defer to next level of care)  Recommendations for Other Services       Functional Status Assessment Patient has had a recent decline in their functional status and demonstrates the ability to make significant improvements in function in a reasonable and predictable amount of time.     Precautions / Restrictions Precautions Precautions: Fall Recall of Precautions/Restrictions: Impaired Restrictions Weight Bearing Restrictions Per Provider Order: No      Mobility  Bed Mobility Overal bed mobility: Needs Assistance Bed Mobility: Supine to Sit, Sit to Supine     Supine to sit: Max assist, +2 for physical assistance, +2 for safety/equipment Sit to supine: +2 for physical assistance, +2 for safety/equipment, Total assist   General bed mobility comments: Patient required assist for trunk support and B LE management    Transfers Overall transfer level: Needs assistance                 General transfer comment: not assessed due to patient's seated balance and lethargy    Ambulation/Gait                  Stairs            Wheelchair Mobility     Tilt Bed    Modified Rankin (Stroke Patients Only)       Balance Overall balance assessment: Needs assistance Sitting-balance support: Feet supported, Bilateral upper extremity supported, Single extremity supported Sitting balance-Leahy Scale: Poor Sitting balance - Comments: Tendency for Left lateral trunk lean requiring min/modA at all times. Frequent verbal/tactile cues for self correcting toward Right side and cervical extension for improved upright posture. Limited  ability to hold position secondary to lethargy. Postural control: Posterior lean, Left lateral lean     Standing balance comment: unable to assess                              Pertinent Vitals/Pain Pain Assessment Pain Assessment: No/denies pain    Home Living Family/patient expects to be discharged to:: Private residence Living Arrangements: Spouse/significant other Available Help at Discharge: Family Type of Home: House                  Prior Function Prior Level of Function : Independent/Modified Independent             Mobility Comments: independent with mobility/ADLs       Extremity/Trunk Assessment   Upper Extremity Assessment Upper Extremity Assessment: RUE deficits/detail;LUE deficits/detail RUE Deficits / Details: Pt was able to complete AROM to about 80 degress shoulder flexion but unable to maintain the position and touch hand to mouth/face. RUE Coordination: decreased fine motor;decreased gross motor LUE Deficits / Details: No noted muscle contration in session nurse reported they have seen slight digital movement LUE Sensation: decreased light touch;decreased proprioception LUE Coordination: decreased fine motor;decreased gross motor    Lower Extremity Assessment Lower Extremity Assessment: RLE deficits/detail;LLE deficits/detail RLE Deficits / Details: Grossly 3-/5 MMT strength LLE Deficits / Details: AROM present at L toes however no AROM present hip/knee/ankle this session    Cervical / Trunk Assessment Cervical / Trunk Assessment: Kyphotic  Communication   Communication Communication: Impaired Factors Affecting Communication: Trach/intubated    Cognition Arousal: Lethargic Behavior During Therapy: Flat affect   PT - Cognitive impairments: Difficult to assess Difficult to assess due to: Level of arousal                     PT - Cognition Comments: limited by lethargy but able to nod yes/no to questions Following commands: Impaired Following commands impaired: Follows one step commands inconsistently (benefits from repetition)     Cueing Cueing Techniques:  Verbal cues, Gestural cues, Tactile cues, Visual cues     General Comments General comments (skin integrity, edema, etc.): Patient tolerated seated EOB for 10 minutes with modA for balance and frequent cues for posture.    Exercises     Assessment/Plan    PT Assessment Patient needs continued PT services  PT Problem List Decreased strength;Decreased balance;Decreased knowledge of precautions;Decreased activity tolerance;Decreased coordination;Decreased safety awareness;Decreased mobility;Decreased knowledge of use of DME;Cardiopulmonary status limiting activity       PT Treatment Interventions DME instruction;Gait training;Functional mobility training;Therapeutic activities;Therapeutic exercise;Balance training;Neuromuscular re-education;Patient/family education    PT Goals (Current goals can be found in the Care Plan section)  Acute Rehab PT Goals Patient Stated Goal: Patient unable to state goal. Husband reported that patient has been wanting to sit up on side of bed. PT Goal Formulation: With patient/family Time For Goal Achievement: 03/17/24 Potential to Achieve Goals: Good    Frequency Min 2X/week     Co-evaluation PT/OT/SLP Co-Evaluation/Treatment: Yes Reason for Co-Treatment: Complexity of the patient's impairments (multi-system involvement) PT goals addressed during session: Mobility/safety with mobility;Balance OT goals addressed during session: ADL's and self-care       AM-PAC PT 6 Clicks Mobility  Outcome Measure Help needed turning from your back to your side while in a flat bed without using bedrails?: A Lot Help needed moving from lying on your back to sitting on the side of a  flat bed without using bedrails?: A Lot Help needed moving to and from a bed to a chair (including a wheelchair)?: Total Help needed standing up from a chair using your arms (e.g., wheelchair or bedside chair)?: Total Help needed to walk in hospital room?: Total Help needed climbing 3-5  steps with a railing? : Total 6 Click Score: 8    End of Session   Activity Tolerance: Patient limited by lethargy Patient left: in bed;with bed alarm set;with family/visitor present;Other (comment) (MD present, heels floating and B UE elevated) Nurse Communication: Mobility status;Other (comment) (thick secretions present from trach following activity) PT Visit Diagnosis: Muscle weakness (generalized) (M62.81);Hemiplegia and hemiparesis Hemiplegia - Right/Left: Left Hemiplegia - caused by: Cerebral infarction    Time: 0916-0940 PT Time Calculation (min) (ACUTE ONLY): 24 min   Charges:   PT Evaluation $PT Eval Moderate Complexity: 1 Mod PT Treatments $Therapeutic Activity: 8-22 mins PT General Charges $$ ACUTE PT VISIT: 1 Visit        Mariano Shiver, PT, DPT [facility] Acute Rehabilitation Office: [phone]   Davey Erp 03/03/2024, 1:50 PM

## 2024-03-03 NOTE — Progress Notes (Signed)
 Patient transported back to 6E28 from radiology.  Patient placed back on trach collar 6L 21% FiO2.  Patient drowsy.  VS stable.Aaron Aas

## 2024-03-03 NOTE — Procedures (Signed)
 Modified Barium Swallow Study  Patient Details  Name: FUSAKO TANABE MRN: [id number] Date of Birth: 04-02-1946  Today's Date: 03/03/2024  Modified Barium Swallow completed.  Full report located under Chart Review in the Imaging Section.  History of Present Illness 78 yo female presenting 6/7 with rapidly enlarging thyroid  mass. CT Neck showed 4.1 x 2.5 x 3.9 cm heterogeneous mass in the right anterior neck at  the level of the thyroid  concerning for residual/recurrent thyroid  malignancy. Associated mass effect and leftward shift of the airway without significant airway narrowing. 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 Associated retropharyngeal edema. Trach done by ENT 6/11. Code stroke was called 6/13 due to L sided weakness although time of onset likely the day prior. BS completed on 6/12 recommending Dys 2, nectar thick liquids but patient the next day, was having difficulty swallowing and had new onset stroke symptoms. MRI showed Patchy acute ischemic nonhemorrhagic right frontal, parietal, and occipital infarcts, watershed in distribution. Patient made NPO awaiting reevaluation of swallowing with repeat MBS recommended. Large bore NG changed to small bore. PMH includes: GERD, stroke, Barrett's esophagus with esophageal dysphagia s/p dilation, HLD, NSTEMI, PAF, CAD, HTN   Clinical Impression As compared to MBS completed on 03/01/24, MBS completed today appeared only mildly different. She continues with limited anterior hyoid movement, reduced laryngeal elevation and incomplete laryngeal vestibule closure. This leads to penetration of thin liquids and nectar thick liquids to level of vocal cords before spontaneously clearing from laryngeal vestibule (PAS 4) as well as an instance each of thin and nectar thick liquid material passing below the vocal cords before spontaneously clearing from  laryngeal vestibule (PAS 6). Silent aspiration was observed with thin liquids. (PAS 8) The main difference in today's MBS as compared to MBS on 6/12 was increased delay in transit of puree solid and honey and nectar thick liquid boluses as they transited through pharynx and through PES. Patient was noticeably tired at beginning of study and did appear to become more fatigued as study progressed, at one point leaning her head against fluoroscopy machine. SLP is recommending continue NPO but allow to continue PRN ice chips as well as floor stock purees/puddings and floor stock nectar thick liquids. SLP will follow for full PO readiness. Factors that may increase risk of adverse event in presence of aspiration Roderick Civatte & Jessy Morocco 2021): Weak cough;Presence of tubes (ETT, trach, NG, etc.);Frail or deconditioned  Swallow Evaluation Recommendations Recommendations: NPO;Ice chips PRN after oral care Medication Administration: Crushed with puree Supervision: Patient able to self-feed;Staff to assist with self-feeding;Full supervision/cueing for swallowing strategies Swallowing strategies  : Place PMSV during PO intake;Slow rate;Small bites/sips Postural changes: Position pt fully upright for meals Oral care recommendations: Oral care BID (2x/day)      Jacqualine Mater, MA, CCC-SLP Speech Therapy

## 2024-03-03 NOTE — Progress Notes (Signed)
 Subjective: No new issues overnight. Her NG tube was replaced with a Dobhoff tube yesterday.  Objective: Vital signs in last 24 hours: Temp:  [98.4 F (36.9 C)-100.2 F (37.9 C)] 99.1 F (37.3 C) (06/14 1228) Pulse Rate:  [79-106] 79 (06/14 0442) Resp:  [16-18] 18 (06/14 1228) BP: (123-174)/(44-67) 130/67 (06/14 1228) SpO2:  [96 %-98 %] 98 % (06/14 0442) FiO2 (%):  [21 %] 21 % (06/14 0809) Weight:  [53.7 kg-58.2 kg] 58.2 kg (06/14 0442)  Exam: General: Awake and alert. Eyes: Pupils are equal, round, reactive to light. Extraocular motion is intact.  Ears: Examination of the ears shows normal auricles and external auditory canals bilaterally. Nose: Nasal examination shows normal mucosa, septum, turbinates.  A Dobhoff tube is in place. Face: Facial examination shows no asymmetry. Palpation of the face elicit no significant tenderness.  Mouth: Oral cavity examination shows no mucosal abnormalities.  Neck: Palpation of the neck reveals a large firm and fixed mass within the right thyroid  lobe.  The tracheostomy tube is in place and patent.  No bleeding is noted.  Recent Labs    03/02/24 0836 03/03/24 1019  WBC 17.3* 13.3*  HGB 10.0* 9.9*  HCT 30.8* 31.7*  PLT 423* 376   Recent Labs    03/02/24 0836 03/03/24 1019  NA 136 133*  K 4.0 4.0  CL 101 101  CO2 25 23  GLUCOSE 137* 326*  BUN 18 27*  CREATININE 0.73 0.90  CALCIUM  8.9 8.9    Medications: I have reviewed the patient's current medications. Scheduled:  aspirin   81 mg Per Tube Daily   clopidogrel   75 mg Oral Daily   diltiazem   30 mg Per Tube BID   famotidine   40 mg Per Tube Daily   feeding supplement (PROSource TF20)  60 mL Per Tube Daily   free water   100 mL Per Tube Q4H   heparin   5,000 Units Subcutaneous Q8H   insulin  aspart  0-15 Units Subcutaneous Q4H   insulin  glargine-yfgn  8 Units Subcutaneous QHS   latanoprost   1 drop Both Eyes QHS   multivitamin with minerals  1 tablet Per Tube Daily   mouth rinse   15 mL Mouth Rinse 4 times per day   polyethylene glycol  17 g Per Tube Daily   rosuvastatin   40 mg Per Tube Daily   timolol   1 drop Both Eyes BID   Continuous:  feeding supplement (OSMOLITE 1.5 CAL) 45 mL/hr at 03/03/24 0849    Assessment/Plan: POD #3 s/p tracheostomy. 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 - Acute stroke 2 days ago.  The family and neurology have decided to proceed with no intervention. - The patient is breathing well via her trach collar. - Will change her cuffed tracheostomy tube to an uncuffed tube. - Awaiting pathology result. - On tube feed. MBS study today.   LOS: 7 days   Anthonia Monger W Ravis Herne 03/03/2024, 1:01 PM

## 2024-03-03 NOTE — Plan of Care (Signed)
   Problem: Coping: Goal: Ability to adjust to condition or change in health will improve Outcome: Progressing   Problem: Fluid Volume: Goal: Ability to maintain a balanced intake and output will improve Outcome: Progressing

## 2024-03-03 NOTE — Progress Notes (Signed)
 Inpatient Rehab Admissions Coordinator Note:   Per therapy patient was screened for CIR candidacy by Emannuel Vise Annell Barrow, CCC-SLP. At this time, pt has not yet attempted transfers. Pt may have potential to progress to becoming a potential CIR candidate. CIR admissions team will follow to monitor for progress and participation in therapies. A consult order will be placed if pt appears to be an appropriate candidate.    Artemus Larsen, MS, CCC-SLP Admissions Coordinator [phone] 03/03/24 5:31 PM

## 2024-03-04 ENCOUNTER — Inpatient Hospital Stay (HOSPITAL_COMMUNITY)

## 2024-03-04 DIAGNOSIS — Z930 Tracheostomy status: Secondary | ICD-10-CM | POA: Diagnosis not present

## 2024-03-04 DIAGNOSIS — R221 Localized swelling, mass and lump, neck: Secondary | ICD-10-CM | POA: Diagnosis not present

## 2024-03-04 DIAGNOSIS — C73 Malignant neoplasm of thyroid gland: Secondary | ICD-10-CM | POA: Diagnosis not present

## 2024-03-04 LAB — URINALYSIS, ROUTINE W REFLEX MICROSCOPIC
Bilirubin Urine: NEGATIVE
Glucose, UA: >=500 mg/dL — AB
Hgb urine dipstick: NEGATIVE
Ketones, ur: NEGATIVE mg/dL
Nitrite: NEGATIVE
Protein, ur: 30 mg/dL — AB
Specific Gravity, Urine: 1.029 (ref 1.005–1.030)
pH: 6 (ref 5.0–8.0)

## 2024-03-04 LAB — CBC WITH DIFFERENTIAL/PLATELET
Abs Immature Granulocytes: 0.11 10*3/uL — ABNORMAL HIGH (ref 0.00–0.07)
Basophils Absolute: 0 10*3/uL (ref 0.0–0.1)
Basophils Relative: 0 %
Eosinophils Absolute: 0.1 10*3/uL (ref 0.0–0.5)
Eosinophils Relative: 1 %
HCT: 31.8 % — ABNORMAL LOW (ref 36.0–46.0)
Hemoglobin: 10.3 g/dL — ABNORMAL LOW (ref 12.0–15.0)
Immature Granulocytes: 1 %
Lymphocytes Relative: 12 %
Lymphs Abs: 1.6 10*3/uL (ref 0.7–4.0)
MCH: 27.6 pg (ref 26.0–34.0)
MCHC: 32.4 g/dL (ref 30.0–36.0)
MCV: 85.3 fL (ref 80.0–100.0)
Monocytes Absolute: 1.1 10*3/uL — ABNORMAL HIGH (ref 0.1–1.0)
Monocytes Relative: 8 %
Neutro Abs: 11 10*3/uL — ABNORMAL HIGH (ref 1.7–7.7)
Neutrophils Relative %: 78 %
Platelets: 389 10*3/uL (ref 150–400)
RBC: 3.73 MIL/uL — ABNORMAL LOW (ref 3.87–5.11)
RDW: 13.7 % (ref 11.5–15.5)
WBC: 14.1 10*3/uL — ABNORMAL HIGH (ref 4.0–10.5)
nRBC: 0 % (ref 0.0–0.2)

## 2024-03-04 LAB — GLUCOSE, CAPILLARY
Glucose-Capillary: 373 mg/dL — ABNORMAL HIGH (ref 70–99)
Glucose-Capillary: 335 mg/dL — ABNORMAL HIGH (ref 70–99)
Glucose-Capillary: 388 mg/dL — ABNORMAL HIGH (ref 70–99)
Glucose-Capillary: 350 mg/dL — ABNORMAL HIGH (ref 70–99)
Glucose-Capillary: 307 mg/dL — ABNORMAL HIGH (ref 70–99)

## 2024-03-04 LAB — COMPREHENSIVE METABOLIC PANEL WITH GFR
ALT: 31 U/L (ref 0–44)
AST: 28 U/L (ref 15–41)
Albumin: 2.3 g/dL — ABNORMAL LOW (ref 3.5–5.0)
Alkaline Phosphatase: 74 U/L (ref 38–126)
Anion gap: 7 (ref 5–15)
BUN: 28 mg/dL — ABNORMAL HIGH (ref 8–23)
CO2: 26 mmol/L (ref 22–32)
Calcium: 8.9 mg/dL (ref 8.9–10.3)
Chloride: 99 mmol/L (ref 98–111)
Creatinine, Ser: 0.91 mg/dL (ref 0.44–1.00)
GFR, Estimated: >60 mL/min (ref >60)
Glucose, Bld: 380 mg/dL — ABNORMAL HIGH (ref 70–99)
Potassium: 4.5 mmol/L (ref 3.5–5.1)
Sodium: 132 mmol/L — ABNORMAL LOW (ref 135–145)
Total Bilirubin: 0.4 mg/dL (ref 0.0–1.2)
Total Protein: 5.9 g/dL — ABNORMAL LOW (ref 6.5–8.1)

## 2024-03-04 LAB — MAGNESIUM: Magnesium: 2 mg/dL (ref 1.7–2.4)

## 2024-03-04 LAB — PHOSPHORUS: Phosphorus: 2.2 mg/dL — ABNORMAL LOW (ref 2.5–4.6)

## 2024-03-04 MED ORDER — LEVOFLOXACIN 500 MG PO TABS
750.0000 mg | ORAL_TABLET | ORAL | Status: DC
  Administered 2024-03-04 – 2024-03-06 (×2): 750 mg via ORAL
  Filled 2024-03-04: qty 2
  Filled 2024-03-04: qty 1

## 2024-03-04 MED ORDER — INSULIN ASPART 100 UNIT/ML IJ SOLN
0.0000 [IU] | INTRAMUSCULAR | Status: DC
  Administered 2024-03-04: 20 [IU] via SUBCUTANEOUS
  Administered 2024-03-04 (×2): 15 [IU] via SUBCUTANEOUS
  Administered 2024-03-05: 11 [IU] via SUBCUTANEOUS
  Administered 2024-03-05: 15 [IU] via SUBCUTANEOUS
  Administered 2024-03-05 (×2): 20 [IU] via SUBCUTANEOUS
  Administered 2024-03-05: 4 [IU] via SUBCUTANEOUS
  Administered 2024-03-05 – 2024-03-06 (×3): 11 [IU] via SUBCUTANEOUS
  Administered 2024-03-06: 15 [IU] via SUBCUTANEOUS
  Administered 2024-03-06 – 2024-03-07 (×5): 11 [IU] via SUBCUTANEOUS
  Administered 2024-03-07: 15 [IU] via SUBCUTANEOUS
  Administered 2024-03-07 (×2): 11 [IU] via SUBCUTANEOUS
  Administered 2024-03-07 – 2024-03-08 (×2): 15 [IU] via SUBCUTANEOUS
  Administered 2024-03-08 (×2): 11 [IU] via SUBCUTANEOUS
  Administered 2024-03-08: 7 [IU] via SUBCUTANEOUS
  Administered 2024-03-08: 20 [IU] via SUBCUTANEOUS
  Administered 2024-03-08: 3 [IU] via SUBCUTANEOUS
  Administered 2024-03-09: 7 [IU] via SUBCUTANEOUS
  Administered 2024-03-09: 4 [IU] via SUBCUTANEOUS
  Administered 2024-03-09: 7 [IU] via SUBCUTANEOUS
  Administered 2024-03-09: 11 [IU] via SUBCUTANEOUS
  Administered 2024-03-09 – 2024-03-10 (×2): 7 [IU] via SUBCUTANEOUS
  Administered 2024-03-10: 11 [IU] via SUBCUTANEOUS
  Administered 2024-03-10: 7 [IU] via SUBCUTANEOUS
  Administered 2024-03-10: 11 [IU] via SUBCUTANEOUS
  Administered 2024-03-10 – 2024-03-11 (×3): 4 [IU] via SUBCUTANEOUS
  Administered 2024-03-11 (×2): 7 [IU] via SUBCUTANEOUS
  Administered 2024-03-11: 3 [IU] via SUBCUTANEOUS
  Administered 2024-03-11 (×2): 7 [IU] via SUBCUTANEOUS
  Administered 2024-03-12 (×3): 4 [IU] via SUBCUTANEOUS
  Administered 2024-03-12: 7 [IU] via SUBCUTANEOUS
  Administered 2024-03-12: 4 [IU] via SUBCUTANEOUS
  Administered 2024-03-12: 11 [IU] via SUBCUTANEOUS
  Administered 2024-03-13: 3 [IU] via SUBCUTANEOUS
  Administered 2024-03-13 (×2): 7 [IU] via SUBCUTANEOUS
  Administered 2024-03-13 (×3): 4 [IU] via SUBCUTANEOUS

## 2024-03-04 MED ORDER — INSULIN GLARGINE-YFGN 100 UNIT/ML ~~LOC~~ SOLN
20.0000 [IU] | Freq: Every day | SUBCUTANEOUS | Status: DC
  Administered 2024-03-04: 20 [IU] via SUBCUTANEOUS
  Filled 2024-03-04 (×2): qty 0.2

## 2024-03-04 MED ORDER — CLONAZEPAM 0.5 MG PO TABS
0.5000 mg | ORAL_TABLET | Freq: Two times a day (BID) | ORAL | Status: AC | PRN
  Administered 2024-03-06 – 2024-03-10 (×6): 0.5 mg via ORAL
  Filled 2024-03-04 (×6): qty 1

## 2024-03-04 MED ORDER — METRONIDAZOLE 500 MG PO TABS
500.0000 mg | ORAL_TABLET | Freq: Two times a day (BID) | ORAL | Status: DC
  Administered 2024-03-04 – 2024-03-08 (×9): 500 mg via ORAL
  Filled 2024-03-04 (×9): qty 1

## 2024-03-04 NOTE — Plan of Care (Signed)

## 2024-03-04 NOTE — Progress Notes (Addendum)
 PROGRESS NOTE    Nancy Sutton  FAO:[id number] DOB: May 18, 1946 DOA: 02/25/2024 PCP: Nancy Parkins, MD  Chief Complaint  Patient presents with   having to clear throat   Neck Pain   thyroid  mass    Brief Narrative:   78 y.o. female,  with history of stroke with residual left hemianopsia, hypertension and type 2 diabetes, history of CAD with NSTEMI March 2024 status post stent placement, patient has been recently followed by Nancy Sutton on 6/3 for surgery workup for Nancy Sutton thyroid  mass, has been cleared by cardiology 6/4 to undergo surgery, patient presents to ED as she has been having difficulty clearing her throat, and worsening neck pain, transferred from [facility] to [facility] for evaluation by general surgery, upon [facility] kids evaluation/10, he recommended ENT involvement, patient was seen by Nancy Sutton with plan for tracheostomy 6/11, and open thyroid  biopsy for definitive histologic diagnosis, medical oncology, radiation oncology has been involved as well, as well Nancy Sutton in communication with ENT at [facility]   Assessment & Plan:   Principal Problem:   Neck mass Active Problems:   Hyperlipidemia   GERD (gastroesophageal reflux disease)   Cerebral infarction (HCC)   Uncontrolled type 1 diabetes mellitus with hyperglycemia (HCC)   NSTEMI (non-ST elevated myocardial infarction) (HCC)   Paroxysmal atrial fibrillation (HCC)   CAD (coronary artery disease)   Anaplastic thyroid  carcinoma (HCC)   Hoarseness   Thyroid  cancer (HCC)   Tracheostomy status (HCC)  Goals of Care They note they don't want her to be placed on Nancy Sutton ventilator.  Discussed additionally, it doesn't sound like they've made decisions on CPR/shocks, etc - husband wants to discuss additionally.  They'll have some additional conversations.  I'm not sure how to change this order to reflect do not ventilate alone (there's Nancy Sutton DNR/DNI order, but this doesn't accurately reflect what they said to me).  Will discuss additionally  and change orders as appropriate.    Systemic Inflammatory Response Syndrome Has had elevated white count for Nancy Sutton few days, now with true fever overnight (Tmax 100.8).   CXR without acute cardiopulm abnormality UA with 11-20 WBC, + LE, negative nitrite.  Many bacteria.  Not particularly impressive for UTI, but will follow urine culture. Follow blood cultures Trach site looks ok, will message ENT via secure chat.  She does have lots of secretions.  After blood cultures collected, will start ceftriaxone/flagyl  empirically  Acute Stroke MRI with patchy acute ischemic nonhemorrhagic right frontal, parietal, and occipital infarcts (watershed in distribution), chronic R PCA distribution infarct.  Loss of normal flow void within cavernous R ICA related to previously identified focal occlusion vs high grade stenosis.   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  Distal reconstitution via colateral flow eith via the ophthalmic artery or cross the circle of willis.  Favored to reflect severe high grade progressive stenosis (see report).  43 ml area delayed prefusion involving R cerebral hemisphere, somewhat watershed in distribution.  Atheromatous change about the L carotid siphon with resultant moderate to severe stenosis at the para clinoid region.  Diffusely irregular and attenuated R PCA.  Echo with normal EF (see report)  Appreciate neurology assistance, related to severe stenosis or near occlusion of R ICA.   A1c 12/2023 6.9, LDL 24 Ok per ENT to start DAPT.  Crestor .   PT/OT/SLP - currently NPO, ok for ice chips and floor stock nectar  thick liquids and/or puree solids   Right anterior neck/thyroid  mass concerning for thyroid  malignancy. - Rapidly enlarging right thyroid  malignancy, concerning for aggressive thyroid  cancer, likely anaplastic - now s/p open thyroid  biopsy and tracheostomy -> changed to uncuffed tube  per Dr. [name] - surgical pathology pending from 6/11 - CT chest abdomen pelvis without metastatic disease visualized - CT neck with irregular peripherally enhancing centrally hypodense lesion within the right thyroid  fossa with associated mild right cervical lymphadenopathy.  LN's centrally hypodense (suppurative or necrotic) (concerning for metastatic thyroid  cancer - see report) - With further recommendations after her surgery today and definitive histologic diagnosis, I will await further recommendation from radiation oncology and oncology pending biopsy results, possible need to transfer to [facility] for radiation and chemotherapy. - cortrak (NG switched to cortrak today), getting tube feeds - SLP to evaluate    History of stroke with left-sided hemianopsia.  - Continue with aspirin , plavix  and statin - recurrent stroke as above   CAD s/p PCI in March 2024.  - Has seen her cardiologist and was cleared to undergo surgical procedure on 02/22/2024, Plavix  on hold continue statin.     History of 1 episode of Nancy Sutton-fib in 2024.  - This was in conjunction of her NSTEMI.  Not on anticoagulation, follows with her cardiologist, continue low-dose Cardizem .   Dyslipidemia.  Continue combination of statin and Zetia.   Hypomagnesemia.  Replaced.     Hypokalemia.  Placed   Essential hypertension.  On Cardizem .   DM type II  A1c 12/2023 6.9  Lantus  increase to 15 units, SSI - adjust as needed  Glaucoma Timolol      DVT prophylaxis: heparin   Code Status: full Family Communication: daughter at bedside Disposition:   Status is: Inpatient Remains inpatient appropriate because: need for inpatient care   Consultants:  ENT General Surgery  Procedures:  6/11 PROCEDURE PERFORMED:   Tracheostomy Open biopsy of the right thyroid  cancer  Echo IMPRESSIONS     1. Left ventricular ejection fraction, by estimation, is 55 to 60%. The  left ventricle has normal function. The left  ventricle has no regional  wall motion abnormalities. Left ventricular diastolic parameters were  normal.   2. Right ventricular systolic function is normal. The right ventricular  size is normal. Tricuspid regurgitation signal is inadequate for assessing  PA pressure.   3. The mitral valve is normal in structure. Trivial mitral valve  regurgitation.   4. The aortic valve was not well visualized. Aortic valve regurgitation  is not visualized. No aortic stenosis is present.   5. The inferior vena cava is dilated in size with >50% respiratory  variability, suggesting right atrial pressure of 8 mmHg.   Antimicrobials:  Anti-infectives (From admission, onward)    Start     Dose/Rate Route Frequency Ordered Stop   02/29/24 1000  clindamycin  (CLEOCIN ) IVPB 900 mg        900 mg 100 mL/hr over 30 Minutes Intravenous  Once 02/29/24 0955 02/29/24 1106   02/29/24 0957  clindamycin  (CLEOCIN ) 900 MG/50ML IVPB       Note to Pharmacy: Clydell Darnel E: cabinet override      02/29/24 0957 02/29/24 1111       Subjective: Concerns about agitation overnight Fever overnight, she felt hot  Objective: Vitals:   03/04/24 0356 03/04/24 0413 03/04/24 0817 03/04/24 0859  BP: (!) 140/44  (!) 150/48   Pulse: 95 94 88   Resp: 18 16 18 18   Temp: Aaron Aas)  100.8 F (38.2 C)  100.1 F (37.8 C)   TempSrc: Oral  Axillary   SpO2: 96%  92%   Weight:      Height:        Intake/Output Summary (Last 24 hours) at 03/04/2024 1023 Last data filed at 03/04/2024 0426 Gross per 24 hour  Intake 682.5 ml  Output 1140 ml  Net -457.5 ml   Filed Weights   03/01/24 0500 03/02/24 1410 03/03/24 0442  Weight: 55.6 kg 53.7 kg 58.2 kg    Examination:  General: ill appearing, tired appearing Cardiovascular: RRR Lungs: lots of secretions from trach, unlabored Abdomen: Soft, nontender, nondistended  Neurological: L sided weakness.  Lethargic this morning, participates halfway in our discussion, acknowledges me, but  doesn't open eyes fully or commit to fully engaging in conversation/waking up. Extremities: No clubbing or cyanosis. No edema.  Data Reviewed: I have personally reviewed following labs and imaging studies  CBC: Recent Labs  Lab 02/28/24 0513 02/29/24 0534 03/01/24 0400 03/02/24 0836 03/03/24 1019 03/04/24 0549  WBC 11.6* 8.4 16.0* 17.3* 13.3* 14.1*  NEUTROABS 8.5* 5.8 14.0* 13.5*  --  11.0*  HGB 9.4* 8.6* 9.5* 10.0* 9.9* 10.3*  HCT 28.7* 26.0* 29.3* 30.8* 31.7* 31.8*  MCV 85.9 85.2 86.2 85.1 86.6 85.3  PLT 295 295 350 423* 376 389    Basic Metabolic Panel: Recent Labs  Lab 02/27/24 0315 02/28/24 0513 02/29/24 0534 03/01/24 0400 03/02/24 0836 03/03/24 1019 03/04/24 0549  NA 137 138 139 136 136 133* 132*  K 3.8 3.6 3.3* 4.1 4.0 4.0 4.5  CL 105 104 107 104 101 101 99  CO2 25 24 25 22 25 23 26   GLUCOSE 115* 126* 102* 227* 137* 326* 380*  BUN 9 8 7* 21 18 27* 28*  CREATININE 0.93 0.78 0.77 0.78 0.73 0.90 0.91  CALCIUM  9.0 8.6* 8.9 8.9 8.9 8.9 8.9  MG 1.6* 2.1 1.8 2.1  --   --  2.0  PHOS 2.5 2.9 3.3 3.7  --   --  2.2*    GFR: Estimated Creatinine Clearance: 40.7 mL/min (by C-G formula based on SCr of 0.91 mg/dL).  Liver Function Tests: Recent Labs  Lab 03/04/24 0549  AST 28  ALT 31  ALKPHOS 74  BILITOT 0.4  PROT 5.9*  ALBUMIN 2.3*    CBG: Recent Labs  Lab 03/03/24 1557 03/03/24 1957 03/03/24 2345 03/04/24 0420 03/04/24 0744  GLUCAP 301* 331* 394* 373* 335*     No results found for this or any previous visit (from the past 240 hours).       Radiology Studies: DG CHEST PORT 1 VIEW Result Date: 03/04/2024 CLINICAL DATA:  [id number] Fever [id number] EXAM: PORTABLE CHEST 1 VIEW COMPARISON:  February 28, 2024 FINDINGS: The cardiomediastinal silhouette is unchanged in contour.Enteric tube tip projects over the expected area of the proximal duodenum. Tracheostomy. Atherosclerotic calcifications. Enteric contrast delineates Jacky Hartung loop of bowel in the LEFT upper  quadrant, likely colon. No pleural effusion. No pneumothorax. No acute pleuroparenchymal abnormality. IMPRESSION: No acute cardiopulmonary abnormality. Electronically Signed   By: Clancy Crimes M.D.   On: 03/04/2024 09:08   DG Swallowing Func-Speech Pathology Result Date: 03/03/2024 Table formatting from the original result was not included. Modified Barium Swallow Study Patient Details Name: ELLYCE LAFEVERS MRN: [id number] Date of Birth: Dec 21, 1945 Today's Date: 03/03/2024 HPI/PMH: HPI: 78 yo female presenting 6/7 with rapidly enlarging thyroid  mass. CT Neck showed 4.1 x 2.5 x 3.9 cm heterogeneous mass in the right anterior neck  at  the level of the thyroid  concerning for residual/recurrent thyroid  malignancy. Associated mass effect and leftward shift of the airway without significant airway narrowing. 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 Associated retropharyngeal edema. Trach done by ENT 6/11. Code stroke was called 6/13 due to L sided weakness although time of onset likely the day prior. BS completed on 6/12 recommending Dys 2, nectar thick liquids but patient the next day, was having difficulty swallowing and had new onset stroke symptoms. MRI showed Patchy acute ischemic nonhemorrhagic right frontal, parietal, and occipital infarcts, watershed in distribution. Patient made NPO awaiting reevaluation of swallowing with repeat MBS recommended. Large bore NG changed to small bore. PMH includes: GERD, stroke, Barrett's esophagus with esophageal dysphagia s/p dilation, HLD, NSTEMI, PAF, CAD, HTN Clinical Impression: Clinical Impression: As compared to MBS completed on 03/01/24, MBS completed today appeared only mildly different. She continues with limited anterior hyoid movement, reduced laryngeal elevation and incomplete laryngeal vestibule closure. This leads to penetration of thin liquids and nectar  thick liquids to level of vocal cords before spontaneously clearing from laryngeal vestibule (PAS 4) as well as an instance each of thin and nectar thick liquid material passing below the vocal cords before spontaneously clearing from laryngeal vestibule (PAS 6). Silent aspiration was observed with thin liquids. (PAS 8) The main difference in today's MBS as compared to MBS on 6/12 was increased delay in transit of puree solid and honey and nectar thick liquid boluses as they transited through pharynx and through PES. Patient was noticeably tired at beginning of study and did appear to become more fatigued as study progressed, at one point leaning her head against fluoroscopy machine. SLP is recommending continue NPO but allow to continue PRN ice chips as well as floor stock purees/puddings and floor stock nectar thick liquids. SLP will follow for full PO readiness. Factors that may increase risk of adverse event in presence of aspiration Roderick Civatte & Jessy Morocco 2021): Factors that may increase risk of adverse event in presence of aspiration Roderick Civatte & Jessy Morocco 2021): Weak cough; Presence of tubes (ETT, trach, NG, etc.); Frail or deconditioned Recommendations/Plan: Swallowing Evaluation Recommendations Swallowing Evaluation Recommendations Recommendations: NPO; Ice chips PRN after oral care Medication Administration: Crushed with puree Supervision: Patient able to self-feed; Staff to assist with self-feeding; Full supervision/cueing for swallowing strategies Swallowing strategies  : Place PMSV during PO intake; Slow rate; Small bites/sips Postural changes: Position pt fully upright for meals Oral care recommendations: Oral care BID (2x/day) Treatment Plan Treatment Plan Treatment recommendations: Therapy as outlined in treatment plan below Follow-up recommendations: Outpatient SLP Functional status assessment: Patient has had Alger Kerstein recent decline in their functional status and demonstrates the ability to make significant  improvements in function in Gwenlyn Hottinger reasonable and predictable amount of time. Treatment frequency: Min 2x/week Treatment duration: 2 weeks Interventions: Aspiration precaution training; Compensatory techniques; Patient/family education; Trials of upgraded texture/liquids; Diet toleration management by SLP; Respiratory muscle strength training Recommendations Recommendations for follow up therapy are one component of Shayma Pfefferle multi-disciplinary discharge planning process, led by the attending physician.  Recommendations may be updated based on patient status, additional functional criteria and insurance authorization. Assessment: Orofacial Exam: Orofacial Exam Oral Cavity: Oral Hygiene: WFL Oral Cavity - Dentition: Adequate natural dentition Orofacial Anatomy: WFL Oral Motor/Sensory Function: WFL Anatomy: Anatomy: Suspected cervical osteophytes Boluses Administered: Boluses Administered Boluses Administered: Thin liquids (Level 0); Mildly thick liquids (Level 2, nectar thick); Moderately thick liquids (Level  3, honey thick); Puree  Oral Impairment Domain: Oral Impairment Domain Lip Closure: No labial escape Tongue control during bolus hold: Cohesive bolus between tongue to palatal seal Bolus preparation/mastication: Timely and efficient chewing and mashing Bolus transport/lingual motion: Brisk tongue motion Oral residue: Complete oral clearance Location of oral residue : N/Mong Neal Initiation of pharyngeal swallow : Valleculae  Pharyngeal Impairment Domain: Pharyngeal Impairment Domain Soft palate elevation: No bolus between soft palate (SP)/pharyngeal wall (PW) Laryngeal elevation: Partial superior movement of thyroid  cartilage/partial approximation of arytenoids to epiglottic petiole Anterior hyoid excursion: Partial anterior movement Epiglottic movement: No inversion Laryngeal vestibule closure: Incomplete, narrow column air/contrast in laryngeal vestibule Pharyngeal stripping wave : Present - complete Pharyngeal contraction (Catherine [place]/P  view only): N/Pauline Pegues Pharyngoesophageal segment opening: Complete distension and complete duration, no obstruction of flow Tongue base retraction: Trace column of contrast or air between tongue base and PPW Pharyngeal residue: Collection of residue within or on pharyngeal structures Location of pharyngeal residue: Pyriform sinuses; Valleculae  Esophageal Impairment Domain: Esophageal Impairment Domain Esophageal clearance upright position: Complete clearance, esophageal coating Pill: No data recorded Penetration/Aspiration Scale Score: Penetration/Aspiration Scale Score 1.  Material does not enter airway: Puree 2.  Material enters airway, remains ABOVE vocal cords then ejected out: Moderately thick liquids (Level 3, honey thick) 3.  Material enters airway, remains ABOVE vocal cords and not ejected out: Mildly thick liquids (Level 2, nectar thick) 4.  Material enters airway, CONTACTS cords then ejected out: Thin liquids (Level 0); Mildly thick liquids (Level 2, nectar thick) 6.  Material enters airway, passes BELOW cords then ejected out: Thin liquids (Level 0); Mildly thick liquids (Level 2, nectar thick) 8.  Material enters airway, passes BELOW cords without attempt by patient to eject out (silent aspiration) : Thin liquids (Level 0) Compensatory Strategies: Compensatory Strategies Compensatory strategies: Yes Right head turn: Ineffective Ineffective Right Head Turn: Thin liquid (Level 0); Mildly thick liquid (Level 2, nectar thick)   General Information: Caregiver present: Yes  Diet Prior to this Study: NPO; Cortrak/Small bore NG tube   No data recorded  Respiratory Status: WFL   No data recorded  History of Recent Intubation: Yes  Behavior/Cognition: Alert; Cooperative; Pleasant mood; Lethargic/Drowsy Self-Feeding Abilities: Able to self-feed Baseline vocal quality/speech: Dysphonic Volitional Cough: Able to elicit Volitional Swallow: Able to elicit Exam Limitations: No limitations Goal Planning: Prognosis for  improved oropharyngeal function: Good Barriers to Reach Goals: Overall medical prognosis No data recorded Patient/Family Stated Goal: patient requesting ice chips Consulted and agree with results and recommendations: Patient; Family member/caregiver; Nurse Pain: Pain Assessment Pain Assessment: Faces Faces Pain Scale: 2 Pain Location: burning in throat from coughing Pain Descriptors / Indicators: Burning Pain Intervention(s): Monitored during session End of Session: Start Time:SLP Start Time (ACUTE ONLY): 1255 Stop Time: SLP Stop Time (ACUTE ONLY): 1315 Time Calculation:SLP Time Calculation (min) (ACUTE ONLY): 20 min Charges: SLP Evaluations $ SLP Speech Visit: 1 Visit SLP Evaluations $MBS Swallow: 1 Procedure $ SLP EVAL LANGUAGE/SOUND PRODUCTION: 1 Procedure $Swallowing Treatment: 1 Procedure SLP visit diagnosis: SLP Visit Diagnosis: Dysphagia, oropharyngeal phase (R13.12) Past Medical History: Past Medical History: Diagnosis Date  Arthritis   CAD (coronary artery disease) 03/28/2023  NSTEMI 11/2022 s/p 3 x 16 mm DES to the [facility], 3 x 20 mm DES to the mRCA   - LHC 12/13/2022: RCA ostial 95, mid 90; LCx mid 70; LAD mid 40, distal 70>> Med Rx for LAD, LCx   - TTE 12/09/2022: EF 50-55, no RWMA, mild concentric LVH, GR 2  DD, normal RVSF, mild MR, RAP 15  Diabetes mellitus   since 2008  Essential hypertension, benign 09/10/2015  GERD (gastroesophageal reflux disease)   Hypercholesteremia   Hyperlipidemia   Stroke (HCC) 03/21/2011  only deficits-affected eyesight Past Surgical History: Past Surgical History: Procedure Laterality Date  BIOPSY  07/02/2022  Procedure: BIOPSY;  Surgeon: Urban Garden, MD;  Location: [facility]O SUITE;  Service: Gastroenterology;;  CATARACT EXTRACTION W/PHACO  11/01/2011  Procedure: CATARACT EXTRACTION PHACO AND INTRAOCULAR LENS PLACEMENT (IOC);  Surgeon: Anner Kill, MD;  Location: [facility] ORS;  Service: Ophthalmology;  Laterality: Right;  CDE 12.48  CORONARY STENT INTERVENTION N/Goldia Ligman 12/10/2022   Procedure: CORONARY STENT INTERVENTION;  Surgeon: Arnoldo Lapping, MD;  Location: [facility] INVASIVE CV LAB;  Service: Cardiovascular;  Laterality: N/Lynsie Mcwatters;  ESOPHAGEAL DILATION N/Kendric Sindelar 04/29/2017  Procedure: ESOPHAGEAL DILATION;  Surgeon: Ruby Corporal, MD;  Location: [facility];  Service: Endoscopy;  Laterality: N/Zeki Bedrosian;  ESOPHAGOGASTRODUODENOSCOPY N/Nazire Fruth 04/29/2017  Procedure: ESOPHAGOGASTRODUODENOSCOPY (EGD);  Surgeon: Ruby Corporal, MD;  Location: [facility];  Service: Endoscopy;  Laterality: N/Gracelynne Benedict;  1015  ESOPHAGOGASTRODUODENOSCOPY (EGD) WITH ESOPHAGEAL DILATION N/Darcus Edds 02/14/2013  Procedure: ESOPHAGOGASTRODUODENOSCOPY (EGD) WITH ESOPHAGEAL DILATION;  Surgeon: Ruby Corporal, MD;  Location: [facility];  Service: Endoscopy;  Laterality: N/Shloima Clinch;  200-moved to 255 Ann to notify pt  ESOPHAGOGASTRODUODENOSCOPY (EGD) WITH PROPOFOL  N/Enzio Buchler 05/28/2022  Procedure: ESOPHAGOGASTRODUODENOSCOPY (EGD) WITH PROPOFOL ;  Surgeon: Urban Garden, MD;  Location: [facility];  Service: Gastroenterology;  Laterality: N/Kaimen Peine;  1030 ASA 2  ESOPHAGOGASTRODUODENOSCOPY (EGD) WITH PROPOFOL  N/Stesha Neyens 07/02/2022  Procedure: ESOPHAGOGASTRODUODENOSCOPY (EGD) WITH PROPOFOL ;  Surgeon: Urban Garden, MD;  Location: [facility];  Service: Gastroenterology;  Laterality: N/Ritvik Mczeal;  105 ASA 2, pt knows new time per Hugo Maes  EYE SURGERY  10-2010  left eye removal of cataract  LEFT HEART CATH AND CORONARY ANGIOGRAPHY N/Mehki Klumpp 12/10/2022  Procedure: LEFT HEART CATH AND CORONARY ANGIOGRAPHY;  Surgeon: Arnoldo Lapping, MD;  Location: [facility] INVASIVE CV LAB;  Service: Cardiovascular;  Laterality: N/Candace Begue;  MASS BIOPSY N/Jauan Wohl 02/29/2024  Procedure: BIOPSY, THYROID ;  Surgeon: Reynold Caves, MD;  Location: [facility] OR;  Service: ENT;  Laterality: N/Sigurd Pugh;  TRACHEOSTOMY TUBE PLACEMENT N/Gilda Abboud 02/29/2024  Procedure: CREATION, TRACHEOSTOMY;  Surgeon: Reynold Caves, MD;  Location: [facility] OR;  Service: ENT;  Laterality: N/Jarry Manon;  WITH THYROID  BIOPSY  TUBAL LIGATION  1984 Jacqualine Mater, MA, CCC-SLP Speech Therapy   MR  BRAIN WO CONTRAST Result Date: 03/02/2024 CLINICAL DATA:  Follow-up examination for acute or deficit, stroke. EXAM: MRI HEAD WITHOUT CONTRAST TECHNIQUE: Multiplanar, multiecho pulse sequences of the brain and surrounding structures were obtained without intravenous contrast. COMPARISON:  Comparison made with CTs from earlier the same day. FINDINGS: Brain: Cerebral volume within normal limits. Encephalomalacia and gliosis involving the right occipital lobe, consistent with Makylah Bossard chronic right PCA distribution infarct. Patchy restricted diffusion involving the cortical to subcortical aspect of the right frontal, parietal, and occipital lobes, consistent with acute ischemic nonhemorrhagic infarcts. These are watershed in distribution. No associated hemorrhage or significant mass effect. No other evidence for acute or subacute ischemia. No acute or chronic intracranial blood products. No mass lesion, midline shift or mass effect. Ex vacuo dilatation of the right lateral ventricle related to the chronic right PCA territory infarct without hydrocephalus. No extra-axial fluid collection. Pituitary gland within normal limits. Vascular: Loss of normal flow void within the cavernous right ICA related to the previously identified focal occlusion versus high-grade stenosis (series 5, image 8). Major intracranial vascular flow voids  are otherwise maintained. Skull and upper cervical spine: Cranial junction within normal limits. Bone marrow signal intensity normal. No scalp soft tissue abnormality. Sinuses/Orbits: Prior bilateral ocular lens replacement. Paranasal sinuses are largely clear. No mastoid effusion. Other: None. IMPRESSION: 1. Patchy acute ischemic nonhemorrhagic right frontal, parietal, and occipital infarcts, watershed in distribution. 2. Chronic right PCA distribution infarct. 3. Loss of normal flow void within the cavernous right ICA related to the previously identified focal occlusion versus high-grade stenosis.  Electronically Signed   By: Virgia Griffins M.D.   On: 03/02/2024 19:08        Scheduled Meds:  aspirin   81 mg Per Tube Daily   clopidogrel   75 mg Oral Daily   diltiazem   30 mg Per Tube BID   famotidine   40 mg Per Tube Daily   feeding supplement (PROSource TF20)  60 mL Per Tube Daily   free water   100 mL Per Tube Q4H   heparin   5,000 Units Subcutaneous Q8H   insulin  aspart  0-15 Units Subcutaneous Q4H   insulin  glargine-yfgn  15 Units Subcutaneous QHS   latanoprost   1 drop Both Eyes QHS   multivitamin with minerals  1 tablet Per Tube Daily   mouth rinse  15 mL Mouth Rinse 4 times per day   polyethylene glycol  17 g Per Tube Daily   rosuvastatin   40 mg Per Tube Daily   timolol   1 drop Both Eyes BID   traZODone  50 mg Per Tube QHS   Continuous Infusions:  feeding supplement (OSMOLITE 1.5 CAL) 45 mL/hr at 03/03/24 1719     LOS: 8 days    Time spent: over 30 min 40 min critical care time with SIRS   Donnetta Gains, MD [facility]   To contact the attending provider between 7A-7P or the covering provider during after hours 7P-7A, please log into the web site [email or web address] and access using universal  password for that web site. If you do not have the password, please call the hospital operator.  03/04/2024, 10:23 AM

## 2024-03-04 NOTE — Progress Notes (Signed)
 Subjective: Fever noted last night. No airway difficulty. No other new issues  Objective: Vital signs in last 24 hours: Temp:  [99.8 F (37.7 C)-100.8 F (38.2 C)] 99.8 F (37.7 C) (06/15 1643) Pulse Rate:  [88-108] 92 (06/15 1643) Resp:  [16-20] 18 (06/15 1643) BP: (109-152)/(44-81) 109/81 (06/15 1643) SpO2:  [92 %-97 %] 94 % (06/15 1643) FiO2 (%):  [21 %] 21 % (06/15 1554)  Exam: General: Sleepy but arousable. Eyes: Pupils are equal, round, reactive to light. Extraocular motion is intact.  Ears: Examination of the ears shows normal auricles and external auditory canals bilaterally. Nose: Nasal examination shows normal mucosa, septum, turbinates.  A Dobhoff tube is in place. Face: Facial examination shows no asymmetry. Palpation of the face elicit no significant tenderness.  Mouth: Oral cavity examination shows no mucosal abnormalities.  Neck: Palpation of the neck reveals a large firm and fixed mass within the right thyroid  lobe.  The tracheostomy tube is in place and patent.  No bleeding is noted.  Recent Labs    03/03/24 1019 03/04/24 0549  WBC 13.3* 14.1*  HGB 9.9* 10.3*  HCT 31.7* 31.8*  PLT 376 389   Recent Labs    03/03/24 1019 03/04/24 0549  NA 133* 132*  K 4.0 4.5  CL 101 99  CO2 23 26  GLUCOSE 326* 380*  BUN 27* 28*  CREATININE 0.90 0.91  CALCIUM  8.9 8.9    Medications: I have reviewed the patient's current medications. Scheduled:  aspirin   81 mg Per Tube Daily   clopidogrel   75 mg Oral Daily   diltiazem   30 mg Per Tube BID   famotidine   40 mg Per Tube Daily   feeding supplement (PROSource TF20)  60 mL Per Tube Daily   free water   100 mL Per Tube Q4H   heparin   5,000 Units Subcutaneous Q8H   insulin  aspart  0-20 Units Subcutaneous Q4H   insulin  glargine-yfgn  20 Units Subcutaneous QHS   latanoprost   1 drop Both Eyes QHS   levofloxacin  750 mg Oral Q48H   metroNIDAZOLE   500 mg Oral Q12H   multivitamin with minerals  1 tablet Per Tube Daily    mouth rinse  15 mL Mouth Rinse 4 times per day   polyethylene glycol  17 g Per Tube Daily   rosuvastatin   40 mg Per Tube Daily   timolol   1 drop Both Eyes BID   traZODone  50 mg Per Tube QHS   Continuous:  feeding supplement (OSMOLITE 1.5 CAL) 45 mL/hr at 03/03/24 1719   PRN:acetaminophen  **OR** acetaminophen , clonazePAM, ondansetron  **OR** ondansetron  (ZOFRAN ) IV, mouth rinse, phenol, sodium chloride   Assessment/Plan: POD #4 s/p tracheostomy, open thyroid  biopsy. 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 - Acute stroke.  The family and neurology have decided to proceed with no intervention. - The patient is breathing well via her trach collar. - Will change her cuffed tracheostomy tube to an uncuffed tube when it is available. - Awaiting pathology result. - On tube feed. MBS showed aspiration on thin liquid.   LOS: 8 days   Favor Hackler W Kinston Magnan 03/04/2024, 6:12 PM

## 2024-03-04 NOTE — Progress Notes (Signed)
 Patient is alert and oriented x4. Nods appropriately and uses white board to communicate. Thick, copious amounts of secretions exude out of trach when patient coughs. Had episodes of blood tinged secretions. She had an episode of vomiting last night that was resolved with zofran . Husband is at bedside. Spoke with her daughter and son who is a Engineer, water. Son had a concern that patient had nothing on her med list for restlessness as she can sometimes become restless throughout the night. Husband and daughter also stated that patient wants to be DNI instead of full code. Will inform day team so that attending may address. Denied additional needs.

## 2024-03-05 DIAGNOSIS — C73 Malignant neoplasm of thyroid gland: Secondary | ICD-10-CM | POA: Diagnosis not present

## 2024-03-05 DIAGNOSIS — R221 Localized swelling, mass and lump, neck: Secondary | ICD-10-CM | POA: Diagnosis not present

## 2024-03-05 LAB — COMPREHENSIVE METABOLIC PANEL WITH GFR
ALT: 40 U/L (ref 0–44)
AST: 45 U/L — ABNORMAL HIGH (ref 15–41)
Albumin: 2.3 g/dL — ABNORMAL LOW (ref 3.5–5.0)
Alkaline Phosphatase: 87 U/L (ref 38–126)
Anion gap: 9 (ref 5–15)
BUN: 36 mg/dL — ABNORMAL HIGH (ref 8–23)
CO2: 25 mmol/L (ref 22–32)
Calcium: 8.8 mg/dL — ABNORMAL LOW (ref 8.9–10.3)
Chloride: 98 mmol/L (ref 98–111)
Creatinine, Ser: 1.01 mg/dL — ABNORMAL HIGH (ref 0.44–1.00)
GFR, Estimated: 57 mL/min — ABNORMAL LOW (ref >60)
Glucose, Bld: 377 mg/dL — ABNORMAL HIGH (ref 70–99)
Potassium: 4.4 mmol/L (ref 3.5–5.1)
Sodium: 132 mmol/L — ABNORMAL LOW (ref 135–145)
Total Bilirubin: 0.2 mg/dL (ref 0.0–1.2)
Total Protein: 6.5 g/dL (ref 6.5–8.1)

## 2024-03-05 LAB — CBC WITH DIFFERENTIAL/PLATELET
Abs Immature Granulocytes: 0.14 10*3/uL — ABNORMAL HIGH (ref 0.00–0.07)
Basophils Absolute: 0 10*3/uL (ref 0.0–0.1)
Basophils Relative: 0 %
Eosinophils Absolute: 0.4 10*3/uL (ref 0.0–0.5)
Eosinophils Relative: 2 %
HCT: 31.1 % — ABNORMAL LOW (ref 36.0–46.0)
Hemoglobin: 10.1 g/dL — ABNORMAL LOW (ref 12.0–15.0)
Immature Granulocytes: 1 %
Lymphocytes Relative: 13 %
Lymphs Abs: 2.1 10*3/uL (ref 0.7–4.0)
MCH: 27.8 pg (ref 26.0–34.0)
MCHC: 32.5 g/dL (ref 30.0–36.0)
MCV: 85.7 fL (ref 80.0–100.0)
Monocytes Absolute: 1.6 10*3/uL — ABNORMAL HIGH (ref 0.1–1.0)
Monocytes Relative: 10 %
Neutro Abs: 11.5 10*3/uL — ABNORMAL HIGH (ref 1.7–7.7)
Neutrophils Relative %: 74 %
Platelets: 397 10*3/uL (ref 150–400)
RBC: 3.63 MIL/uL — ABNORMAL LOW (ref 3.87–5.11)
RDW: 13.7 % (ref 11.5–15.5)
WBC: 15.8 10*3/uL — ABNORMAL HIGH (ref 4.0–10.5)
nRBC: 0 % (ref 0.0–0.2)

## 2024-03-05 LAB — PHOSPHORUS: Phosphorus: 2.7 mg/dL (ref 2.5–4.6)

## 2024-03-05 LAB — GLUCOSE, CAPILLARY
Glucose-Capillary: 185 mg/dL — ABNORMAL HIGH (ref 70–99)
Glucose-Capillary: 298 mg/dL — ABNORMAL HIGH (ref 70–99)
Glucose-Capillary: 300 mg/dL — ABNORMAL HIGH (ref 70–99)
Glucose-Capillary: 332 mg/dL — ABNORMAL HIGH (ref 70–99)
Glucose-Capillary: 354 mg/dL — ABNORMAL HIGH (ref 70–99)
Glucose-Capillary: 393 mg/dL — ABNORMAL HIGH (ref 70–99)

## 2024-03-05 LAB — MAGNESIUM: Magnesium: 2.2 mg/dL (ref 1.7–2.4)

## 2024-03-05 LAB — SURGICAL PATHOLOGY

## 2024-03-05 MED ORDER — TRAZODONE HCL 50 MG PO TABS
50.0000 mg | ORAL_TABLET | Freq: Every day | ORAL | Status: DC
  Administered 2024-03-05 – 2024-03-13 (×9): 50 mg via ORAL
  Filled 2024-03-05 (×9): qty 1

## 2024-03-05 MED ORDER — INSULIN ASPART 100 UNIT/ML IJ SOLN
6.0000 [IU] | INTRAMUSCULAR | Status: DC
  Administered 2024-03-05 – 2024-03-07 (×10): 6 [IU] via SUBCUTANEOUS

## 2024-03-05 MED ORDER — INSULIN GLARGINE-YFGN 100 UNIT/ML ~~LOC~~ SOLN
30.0000 [IU] | Freq: Every day | SUBCUTANEOUS | Status: DC
  Administered 2024-03-05 – 2024-03-06 (×2): 30 [IU] via SUBCUTANEOUS
  Filled 2024-03-05 (×3): qty 0.3

## 2024-03-05 MED ORDER — ROSUVASTATIN CALCIUM 20 MG PO TABS
40.0000 mg | ORAL_TABLET | Freq: Every day | ORAL | Status: DC
  Administered 2024-03-06 – 2024-03-13 (×8): 40 mg via ORAL
  Filled 2024-03-05 (×8): qty 2

## 2024-03-05 NOTE — Progress Notes (Signed)
 PROGRESS NOTE    Nancy Sutton  AVW:[id number] DOB: February 28, 1946 DOA: 02/25/2024 PCP: [name], MD  Chief Complaint  Patient presents with   having to clear throat   Neck Pain   thyroid  mass    Brief Narrative:   78 y.o. female,  with history of stroke with residual left hemianopsia, hypertension and type 2 diabetes, history of CAD with NSTEMI March 2024 status post stent placement, patient has been recently followed by Dr. Abraham Abo on 6/3 for surgery workup for [name] thyroid  mass, has been cleared by cardiology 6/4 to undergo surgery, patient presents to ED as she has been having difficulty clearing her throat, and worsening neck pain, transferred from [facility] to [facility] for evaluation by general surgery, upon [facility] kids evaluation/10, he recommended ENT involvement, patient was seen by Dr. Bobetta Burrows with plan for tracheostomy 6/11, and open thyroid  biopsy for definitive histologic diagnosis, medical oncology, radiation oncology has been involved as well, as well Dr. Teo in communication with ENT at [facility]   Pathology shows anaplastic thyroid  carcinoma.  Transferring to [facility] for radiation.  Oncology and radiation oncology have been consulted.  Palliative care consulted.    Assessment & Plan:   Principal Problem:   Neck mass Active Problems:   Hyperlipidemia   GERD (gastroesophageal reflux disease)   Cerebral infarction (HCC)   Uncontrolled type 1 diabetes mellitus with hyperglycemia (HCC)   NSTEMI (non-ST elevated myocardial infarction) (HCC)   Paroxysmal atrial fibrillation (HCC)   CAD (coronary artery disease)   Anaplastic thyroid  carcinoma (HCC)   Hoarseness   Thyroid  cancer (HCC)   Tracheostomy status (HCC)  Goals of Care They note they don't want her to be placed on Lambert Jeanty ventilator.  Discussed additionally, it doesn't sound like they've made decisions on CPR/shocks, etc - husband wants to discuss additionally.  They'll have some additional conversations.  I'm not sure how  to change this order to reflect do not ventilate alone (there's [name] DNR/DNI order, but this doesn't accurately reflect what they said to me).  Will discuss additionally and change orders as appropriate.  Palliative care consulted.  Anaplastic Thyroid  Carcinoma - Rapidly enlarging right thyroid  malignancy, concerning for aggressive thyroid  cancer - now s/p open thyroid  biopsy and tracheostomy -> changed to uncuffed tube per Dr. [name] - surgical pathology with anaplastic thyroid  carcinoma with P53 overexpression - CT chest abdomen pelvis without metastatic disease visualized - CT neck with irregular peripherally enhancing centrally hypodense lesion within the right thyroid  fossa with associated mild right cervical lymphadenopathy.  LN's centrally hypodense (suppurative or necrotic) (concerning for metastatic thyroid  cancer - see report) - transferring to [facility] for potential radiation - oncology (Pasam) and radiation oncology [name]) have been consulted, appreciate assistance  - cortrak (NG switched to cortrak today), getting tube feeds - SLP to evaluate, appreciate recommendations - palliative care c/s as well    Systemic Inflammatory Response Syndrome Improved fever curve today, persistent leukocytosis  CXR without acute cardiopulm abnormality UA with 11-20 WBC, + LE, negative nitrite.  Many bacteria.  Not particularly impressive for UTI, but will follow urine culture. Follow blood cultures Currently on levaquin/flagyl  given her amp allergy  Acute Stroke MRI with patchy acute ischemic nonhemorrhagic right frontal, parietal, and occipital infarcts (watershed in distribution), chronic R PCA distribution infarct.  Loss of normal flow void within cavernous R ICA related to previously identified focal occlusion vs high grade stenosis.   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  Distal reconstitution via colateral flow  eith via the ophthalmic artery or cross the circle of willis.  Favored to reflect severe high grade progressive stenosis (see report).  43 ml area delayed prefusion involving R cerebral hemisphere, somewhat watershed in distribution.  Atheromatous change about the L carotid siphon with resultant moderate to severe stenosis at the para clinoid region.  Diffusely irregular and attenuated R PCA.  Echo with normal EF (see report)  Appreciate neurology assistance, related to severe stenosis or near occlusion of R ICA.   A1c 12/2023 6.9, LDL 24 Ok per ENT to start DAPT.  Crestor .   PT/OT/SLP - nectar thick liquids (appreciate further SLP recommendations)  History of stroke with left-sided hemianopsia.  - Continue with aspirin , plavix  and statin - recurrent stroke as above   CAD s/p PCI in March 2024.  - Has seen her cardiologist and was cleared to undergo surgical procedure on 02/22/2024, Plavix , aspirin , statin.     History of 1 episode of Pascual Mantel-fib in 2024.  - This was in conjunction of her NSTEMI.  Not on anticoagulation, follows with her cardiologist, continue low-dose Cardizem .   Dyslipidemia.  Continue combination of statin and Zetia.   Hypomagnesemia.  Replaced.     Hypokalemia.  Placed   Essential hypertension.  On Cardizem .   DM type II  A1c 12/2023 6.9  Adjust insulin  as appropriate  Glaucoma Timolol      DVT prophylaxis: heparin   Code Status: full Family Communication: discussed with son (over phone), daughter, husband Disposition:   Status is: Inpatient Remains inpatient appropriate because: need for inpatient care   Consultants:  ENT General Surgery  Procedures:  6/11 PROCEDURE PERFORMED:   Tracheostomy Open biopsy of the right thyroid  cancer  Echo IMPRESSIONS     1. Left ventricular ejection fraction, by estimation, is 55 to 60%. The  left ventricle has normal function. The left ventricle has no regional  wall motion abnormalities. Left ventricular diastolic  parameters were  normal.   2. Right ventricular systolic function is normal. The right ventricular  size is normal. Tricuspid regurgitation signal is inadequate for assessing  PA pressure.   3. The mitral valve is normal in structure. Trivial mitral valve  regurgitation.   4. The aortic valve was not well visualized. Aortic valve regurgitation  is not visualized. No aortic stenosis is present.   5. The inferior vena cava is dilated in size with >50% respiratory  variability, suggesting right atrial pressure of 8 mmHg.   Antimicrobials:  Anti-infectives (From admission, onward)    Start     Dose/Rate Route Frequency Ordered Stop   03/04/24 1415  levofloxacin (LEVAQUIN) tablet 750 mg        750 mg Oral Every 48 hours 03/04/24 1315     03/04/24 1400  metroNIDAZOLE  (FLAGYL ) tablet 500 mg        500 mg Oral Every 12 hours 03/04/24 1309     02/29/24 1000  clindamycin  (CLEOCIN ) IVPB 900 mg        900 mg 100 mL/hr over 30 Minutes Intravenous  Once 02/29/24 0955 02/29/24 1106   02/29/24 0957  clindamycin  (CLEOCIN ) 900 MG/50ML IVPB       Note to Pharmacy: Clydell Darnel E: cabinet override      02/29/24 0957 02/29/24 1111       Subjective: Feels Cass Vandermeulen little better today  Objective: Vitals:   03/05/24 0728 03/05/24 0742 03/05/24 1059 03/05/24 1100  BP: (!) 128/47  (!) 136/49 (!) 136/49  Pulse: 91 93 91 91  Resp: 20 18 18    Temp: 99.2 F (37.3 C)  97.9 F (36.6 C)   TempSrc: Oral  Oral   SpO2: 95% 96% 97% 98%  Weight:      Height:        Intake/Output Summary (Last 24 hours) at 03/05/2024 1246 Last data filed at 03/05/2024 0500 Gross per 24 hour  Intake 60 ml  Output 500 ml  Net -440 ml   Filed Weights   03/02/24 1410 03/03/24 0442  Weight: 53.7 kg 58.2 kg    Examination:  General: No acute distress. Cardiovascular: RRR Lungs: unlabored - less bothersome secretions today Neurological: more alert today, L sided weakness Extremities: No clubbing or cyanosis. No edema.    Data Reviewed: I have personally reviewed following labs and imaging studies  CBC: Recent Labs  Lab 02/29/24 0534 03/01/24 0400 03/02/24 0836 03/03/24 1019 03/04/24 0549 03/05/24 0435  WBC 8.4 16.0* 17.3* 13.3* 14.1* 15.8*  NEUTROABS 5.8 14.0* 13.5*  --  11.0* 11.5*  HGB 8.6* 9.5* 10.0* 9.9* 10.3* 10.1*  HCT 26.0* 29.3* 30.8* 31.7* 31.8* 31.1*  MCV 85.2 86.2 85.1 86.6 85.3 85.7  PLT 295 350 423* 376 389 397    Basic Metabolic Panel: Recent Labs  Lab 02/28/24 0513 02/29/24 0534 03/01/24 0400 03/02/24 0836 03/03/24 1019 03/04/24 0549 03/05/24 0435  NA 138 139 136 136 133* 132* 132*  K 3.6 3.3* 4.1 4.0 4.0 4.5 4.4  CL 104 107 104 101 101 99 98  CO2 24 25 22 25 23 26 25   GLUCOSE 126* 102* 227* 137* 326* 380* 377*  BUN 8 7* 21 18 27* 28* 36*  CREATININE 0.78 0.77 0.78 0.73 0.90 0.91 1.01*  CALCIUM  8.6* 8.9 8.9 8.9 8.9 8.9 8.8*  MG 2.1 1.8 2.1  --   --  2.0 2.2  PHOS 2.9 3.3 3.7  --   --  2.2* 2.7    GFR: Estimated Creatinine Clearance: 36.7 mL/min (Reef Achterberg) (by C-G formula based on SCr of 1.01 mg/dL (H)).  Liver Function Tests: Recent Labs  Lab 03/04/24 0549 03/05/24 0435  AST 28 45*  ALT 31 40  ALKPHOS 74 87  BILITOT 0.4 0.2  PROT 5.9* 6.5  ALBUMIN 2.3* 2.3*    CBG: Recent Labs  Lab 03/04/24 2018 03/05/24 0016 03/05/24 0412 03/05/24 0726 03/05/24 1104  GLUCAP 307* 300* 393* 332* 354*     Recent Results (from the past 240 hours)  Culture, blood (Routine X 2) w Reflex to ID Panel     Status: None (Preliminary result)   Collection Time: 03/04/24  3:06 PM   Specimen: BLOOD RIGHT HAND  Result Value Ref Range Status   Specimen Description BLOOD RIGHT HAND  Final   Special Requests   Final    BOTTLES DRAWN AEROBIC ONLY Blood Culture results may not be optimal due to an inadequate volume of blood received in culture bottles   Culture   Final    NO GROWTH < 24 HOURS Performed at [facility], [address], [place], [place] [address]    Report  Status PENDING  Incomplete  Culture, blood (Routine X 2) w Reflex to ID Panel     Status: None (Preliminary result)   Collection Time: 03/04/24  3:13 PM   Specimen: BLOOD LEFT ARM  Result Value Ref Range Status   Specimen Description BLOOD LEFT ARM  Final   Special  Requests   Final    BOTTLES DRAWN AEROBIC AND ANAEROBIC Blood Culture adequate volume   Culture   Final    NO GROWTH < 24 HOURS Performed at [facility], [address], [place], [place] [address]    Report Status PENDING  Incomplete         Radiology Studies: DG CHEST PORT 1 VIEW Result Date: 03/04/2024 CLINICAL DATA:  [id number] Fever [id number] EXAM: PORTABLE CHEST 1 VIEW COMPARISON:  February 28, 2024 FINDINGS: The cardiomediastinal silhouette is unchanged in contour.Enteric tube tip projects over the expected area of the proximal duodenum. Tracheostomy. Atherosclerotic calcifications. Enteric contrast delineates Dereonna Lensing loop of bowel in the LEFT upper quadrant, likely colon. No pleural effusion. No pneumothorax. No acute pleuroparenchymal abnormality. IMPRESSION: No acute cardiopulmonary abnormality. Electronically Signed   By: Clancy Crimes M.D.   On: 03/04/2024 09:08   DG Swallowing Func-Speech Pathology Result Date: 03/03/2024 Table formatting from the original result was not included. Modified Barium Swallow Study Patient Details Name: Nancy Sutton MRN: [id number] Date of Birth: 1946-04-25 Today's Date: 03/03/2024 HPI/PMH: HPI: 78 yo female presenting 6/7 with rapidly enlarging thyroid  mass. CT Neck showed 4.1 x 2.5 x 3.9 cm heterogeneous mass in the right anterior neck at  the level of the thyroid  concerning for residual/recurrent thyroid  malignancy. Associated mass effect and leftward shift of the airway without significant airway narrowing. 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 Associated retropharyngeal edema. Trach done  by ENT 6/11. Code stroke was called 6/13 due to L sided weakness although time of onset likely the day prior. BS completed on 6/12 recommending Dys 2, nectar thick liquids but patient the next day, was having difficulty swallowing and had new onset stroke symptoms. MRI showed Patchy acute ischemic nonhemorrhagic right frontal, parietal, and occipital infarcts, watershed in distribution. Patient made NPO awaiting reevaluation of swallowing with repeat MBS recommended. Large bore NG changed to small bore. PMH includes: GERD, stroke, Barrett's esophagus with esophageal dysphagia s/p dilation, HLD, NSTEMI, PAF, CAD, HTN Clinical Impression: Clinical Impression: As compared to MBS completed on 03/01/24, MBS completed today appeared only mildly different. She continues with limited anterior hyoid movement, reduced laryngeal elevation and incomplete laryngeal vestibule closure. This leads to penetration of thin liquids and nectar thick liquids to level of vocal cords before spontaneously clearing from laryngeal vestibule (PAS 4) as well as an instance each of thin and nectar thick liquid material passing below the vocal cords before spontaneously clearing from laryngeal vestibule (PAS 6). Silent aspiration was observed with thin liquids. (PAS 8) The main difference in today's MBS as compared to MBS on 6/12 was increased delay in transit of puree solid and honey and nectar thick liquid boluses as they transited through pharynx and through PES. Patient was noticeably tired at beginning of study and did appear to become more fatigued as study progressed, at one point leaning her head against fluoroscopy machine. SLP is recommending continue NPO but allow to continue PRN ice chips as well as floor stock purees/puddings and floor stock nectar thick liquids. SLP will follow for full PO readiness. Factors that may increase risk of adverse event in presence of aspiration Roderick Civatte & Jessy Morocco 2021): Factors that may increase risk of  adverse event in presence of aspiration Roderick Civatte & Jessy Morocco 2021): Weak cough; Presence of tubes (ETT, trach, NG, etc.); Frail or deconditioned Recommendations/Plan: Swallowing Evaluation Recommendations Swallowing Evaluation Recommendations Recommendations: NPO; Ice chips  PRN after oral care Medication Administration: Crushed with puree Supervision: Patient able to self-feed; Staff to assist with self-feeding; Full supervision/cueing for swallowing strategies Swallowing strategies  : Place PMSV during PO intake; Slow rate; Small bites/sips Postural changes: Position pt fully upright for meals Oral care recommendations: Oral care BID (2x/day) Treatment Plan Treatment Plan Treatment recommendations: Therapy as outlined in treatment plan below Follow-up recommendations: Outpatient SLP Functional status assessment: Patient has had Dorathy Stallone recent decline in their functional status and demonstrates the ability to make significant improvements in function in Ryane Konieczny reasonable and predictable amount of time. Treatment frequency: Min 2x/week Treatment duration: 2 weeks Interventions: Aspiration precaution training; Compensatory techniques; Patient/family education; Trials of upgraded texture/liquids; Diet toleration management by SLP; Respiratory muscle strength training Recommendations Recommendations for follow up therapy are one component of Dalana Pfahler multi-disciplinary discharge planning process, led by the attending physician.  Recommendations may be updated based on patient status, additional functional criteria and insurance authorization. Assessment: Orofacial Exam: Orofacial Exam Oral Cavity: Oral Hygiene: WFL Oral Cavity - Dentition: Adequate natural dentition Orofacial Anatomy: WFL Oral Motor/Sensory Function: WFL Anatomy: Anatomy: Suspected cervical osteophytes Boluses Administered: Boluses Administered Boluses Administered: Thin liquids (Level 0); Mildly thick liquids (Level 2, nectar thick); Moderately thick liquids (Level 3,  honey thick); Puree  Oral Impairment Domain: Oral Impairment Domain Lip Closure: No labial escape Tongue control during bolus hold: Cohesive bolus between tongue to palatal seal Bolus preparation/mastication: Timely and efficient chewing and mashing Bolus transport/lingual motion: Brisk tongue motion Oral residue: Complete oral clearance Location of oral residue : N/Breon Rehm Initiation of pharyngeal swallow : Valleculae  Pharyngeal Impairment Domain: Pharyngeal Impairment Domain Soft palate elevation: No bolus between soft palate (SP)/pharyngeal wall (PW) Laryngeal elevation: Partial superior movement of thyroid  cartilage/partial approximation of arytenoids to epiglottic petiole Anterior hyoid excursion: Partial anterior movement Epiglottic movement: No inversion Laryngeal vestibule closure: Incomplete, narrow column air/contrast in laryngeal vestibule Pharyngeal stripping wave : Present - complete Pharyngeal contraction (Miko Sirico/P view only): N/Jodean Valade Pharyngoesophageal segment opening: Complete distension and complete duration, no obstruction of flow Tongue base retraction: Trace column of contrast or air between tongue base and PPW Pharyngeal residue: Collection of residue within or on pharyngeal structures Location of pharyngeal residue: Pyriform sinuses; Valleculae  Esophageal Impairment Domain: Esophageal Impairment Domain Esophageal clearance upright position: Complete clearance, esophageal coating Pill: No data recorded Penetration/Aspiration Scale Score: Penetration/Aspiration Scale Score 1.  Material does not enter airway: Puree 2.  Material enters airway, remains ABOVE vocal cords then ejected out: Moderately thick liquids (Level 3, honey thick) 3.  Material enters airway, remains ABOVE vocal cords and not ejected out: Mildly thick liquids (Level 2, nectar thick) 4.  Material enters airway, CONTACTS cords then ejected out: Thin liquids (Level 0); Mildly thick liquids (Level 2, nectar thick) 6.  Material enters airway,  passes BELOW cords then ejected out: Thin liquids (Level 0); Mildly thick liquids (Level 2, nectar thick) 8.  Material enters airway, passes BELOW cords without attempt by patient to eject out (silent aspiration) : Thin liquids (Level 0) Compensatory Strategies: Compensatory Strategies Compensatory strategies: Yes Right head turn: Ineffective Ineffective Right Head Turn: Thin liquid (Level 0); Mildly thick liquid (Level 2, nectar thick)   General Information: Caregiver present: Yes  Diet Prior to this Study: NPO; Cortrak/Small bore NG tube   No data recorded  Respiratory Status: WFL   No data recorded  History of Recent Intubation: Yes  Behavior/Cognition: Alert; Cooperative; Pleasant mood; Lethargic/Drowsy Self-Feeding Abilities: Able to self-feed Baseline vocal quality/speech: Dysphonic  Volitional Cough: Able to elicit Volitional Swallow: Able to elicit Exam Limitations: No limitations Goal Planning: Prognosis for improved oropharyngeal function: Good Barriers to Reach Goals: Overall medical prognosis No data recorded Patient/Family Stated Goal: patient requesting ice chips Consulted and agree with results and recommendations: Patient; Family member/caregiver; Nurse Pain: Pain Assessment Pain Assessment: Faces Faces Pain Scale: 2 Pain Location: burning in throat from coughing Pain Descriptors / Indicators: Burning Pain Intervention(s): Monitored during session End of Session: Start Time:SLP Start Time (ACUTE ONLY): 1255 Stop Time: SLP Stop Time (ACUTE ONLY): 1315 Time Calculation:SLP Time Calculation (min) (ACUTE ONLY): 20 min Charges: SLP Evaluations $ SLP Speech Visit: 1 Visit SLP Evaluations $MBS Swallow: 1 Procedure $ SLP EVAL LANGUAGE/SOUND PRODUCTION: 1 Procedure $Swallowing Treatment: 1 Procedure SLP visit diagnosis: SLP Visit Diagnosis: Dysphagia, oropharyngeal phase (R13.12) Past Medical History: Past Medical History: Diagnosis Date  Arthritis   CAD (coronary artery disease) 03/28/2023  NSTEMI 11/2022 s/p  3 x 16 mm DES to the [facility], 3 x 20 mm DES to the mRCA   - LHC 12/13/2022: RCA ostial 95, mid 90; LCx mid 70; LAD mid 40, distal 70>> Med Rx for LAD, LCx   - TTE 12/09/2022: EF 50-55, no RWMA, mild concentric LVH, GR 2 DD, normal RVSF, mild MR, RAP 15  Diabetes mellitus   since 2008  Essential hypertension, benign 09/10/2015  GERD (gastroesophageal reflux disease)   Hypercholesteremia   Hyperlipidemia   Stroke (HCC) 03/21/2011  only deficits-affected eyesight Past Surgical History: Past Surgical History: Procedure Laterality Date  BIOPSY  07/02/2022  Procedure: BIOPSY;  Surgeon: Urban Garden, MD;  Location: [facility] ENDO SUITE;  Service: Gastroenterology;;  CATARACT EXTRACTION W/PHACO  11/01/2011  Procedure: CATARACT EXTRACTION PHACO AND INTRAOCULAR LENS PLACEMENT (IOC);  Surgeon: Anner Kill, MD;  Location: [facility] ORS;  Service: Ophthalmology;  Laterality: Right;  CDE 12.48  CORONARY STENT INTERVENTION N/Carrington Olazabal 12/10/2022  Procedure: CORONARY STENT INTERVENTION;  Surgeon: Arnoldo Lapping, MD;  Location: [facility] INVASIVE CV LAB;  Service: Cardiovascular;  Laterality: N/Chae Shuster;  ESOPHAGEAL DILATION N/Erron Wengert 04/29/2017  Procedure: ESOPHAGEAL DILATION;  Surgeon: Ruby Corporal, MD;  Location: [facility] ENDO SUITE;  Service: Endoscopy;  Laterality: N/Kamaree Wheatley;  ESOPHAGOGASTRODUODENOSCOPY N/Dawnita Molner 04/29/2017  Procedure: ESOPHAGOGASTRODUODENOSCOPY (EGD);  Surgeon: Ruby Corporal, MD;  Location: [facility] END[facility] SUITE;  Service: Endoscopy;  Laterality: N/Dylyn Mclaren;  1015  ESOPHAGOGASTRODUODENOSCOPY (EGD) WITH ESOPHAGEAL DILATION N/Daytona Retana 02/14/2013  Procedure: ESOPHAGOGASTRODUODENOSCOPY (EGD) WITH ESOPHAGEAL DILATION;  Surgeon: Ruby Corporal, MD;  Location: [facility] SUITE;  Service: Endoscopy;  Laterality: N/Paarth Cropper;  200-moved to 255 Ann to notify pt  ESOPHAGOGASTRODUODENOSCOPY (EGD) WITH PROPOFOL  N/Bobbye Reinitz 05/28/2022  Procedure: ESOPHAGOGASTRODUODENOSCOPY (EGD) WITH PROPOFOL ;  Surgeon: Urban Garden, MD;  Location: [facility];  Service: Gastroenterology;  Laterality: N/Natalya Domzalski;  1030 ASA  2  ESOPHAGOGASTRODUODENOSCOPY (EGD) WITH PROPOFOL  N/Zakkary Thibault 07/02/2022  Procedure: ESOPHAGOGASTRODUODENOSCOPY (EGD) WITH PROPOFOL ;  Surgeon: Urban Garden, MD;  Location: [facility];  Service: Gastroenterology;  Laterality: N/Himmat Enberg;  105 ASA 2, pt knows new time per Hugo Maes  EYE SURGERY  10-2010  left eye removal of cataract  LEFT HEART CATH AND CORONARY ANGIOGRAPHY N/Ziah Turvey 12/10/2022  Procedure: LEFT HEART CATH AND CORONARY ANGIOGRAPHY;  Surgeon: Arnoldo Lapping, MD;  Location: [facility] INVASIVE CV LAB;  Service: Cardiovascular;  Laterality: N/Izora Benn;  MASS BIOPSY N/Aldan Camey 02/29/2024  Procedure: BIOPSY, THYROID ;  Surgeon: Reynold Caves, MD;  Location: [facility] OR;  Service: ENT;  Laterality: N/Keela Rubert;  TRACHEOSTOMY TUBE PLACEMENT N/Joeangel Jeanpaul 02/29/2024  Procedure: CREATION, TRACHEOSTOMY;  Surgeon: Reynold Caves, MD;  Location: [facility] OR;  Service: ENT;  Laterality: N/Xayden Linsey;  WITH THYROID  BIOPSY  TUBAL LIGATION  1984 Jacqualine Mater, MA, CCC-SLP Speech Therapy        Scheduled Meds:  aspirin   81 mg Per Tube Daily   clopidogrel   75 mg Oral Daily   diltiazem   30 mg Per Tube BID   famotidine   40 mg Per Tube Daily   feeding supplement (PROSource TF20)  60 mL Per Tube Daily   free water   100 mL Per Tube Q4H   heparin   5,000 Units Subcutaneous Q8H   insulin  aspart  0-20 Units Subcutaneous Q4H   insulin  aspart  6 Units Subcutaneous Q4H   insulin  glargine-yfgn  30 Units Subcutaneous QHS   latanoprost   1 drop Both Eyes QHS   levofloxacin  750 mg Oral Q48H   metroNIDAZOLE   500 mg Oral Q12H   multivitamin with minerals  1 tablet Per Tube Daily   mouth rinse  15 mL Mouth Rinse 4 times per day   polyethylene glycol  17 g Per Tube Daily   [START ON 03/06/2024] rosuvastatin   40 mg Oral Daily   timolol   1 drop Both Eyes BID   traZODone  50 mg Oral QHS   Continuous Infusions:  feeding supplement (OSMOLITE 1.5 CAL) 45 mL/hr at 03/03/24 1719     LOS: 9 days    Time spent: over 30 min 40 min critical care time with SIRS   Donnetta Gains, MD [facility]   To contact the attending provider between 7A-7P or the covering provider during after hours 7P-7A, please log into the web site [email or web address] and access using universal [place] password for that web site. If you do not have the password, please call the hospital operator.  03/05/2024, 12:46 PM

## 2024-03-05 NOTE — Plan of Care (Signed)

## 2024-03-05 NOTE — Progress Notes (Addendum)
 The patient arrived to the unit. VS are stable. Passy muir in place,  She is A&O x3, disoriented to situation. Pt utilizes written communication on a dry erase board. Respiratory therapy contacted to assist with equipment. Hospitalist provider updated regarding arrival.

## 2024-03-05 NOTE — Consult Note (Signed)
 [facility]  [phone]   HEMATOLOGY/ONCOLOGY IN-PATIENT CONSULTATION NOTE   PATIENT NAME: Nancy Sutton   MR#: [id number] DOB: May 23, 1946 CSN#: [id number]   DATE OF SERVICE: 03/05/2024  Requesting Physician: [facility]   Patient Care Team: [name], MD as PCP - General (Internal Medicine) Jann Melody, MD as PCP - Cardiology (Cardiology)  REASON FOR CONSULTATION:  Newly diagnosed anaplastic thyroid  carcinoma  ASSESSMENT & PLAN:  MALKA BOCEK is a 78 y.o. lady with history of stroke with residual left hemianopsia, hypertension and type 2 diabetes, history of CAD with NSTEMI March 2024 status post stent placement, was diagnosed with anaplastic thyroid  carcinoma.  Rapidly progressing anaplastic thyroid  carcinoma.  Not a surgical candidate per otolaryngology after discussion with tertiary center.  Unfortunately this cancer is associated with very poor prognosis and not chemosensitive.  Briefly discussed systemic options of carboplatin/paclitaxel versus TKI therapy.  She has multiple medical comorbidities including CVA, limited performance status that make systemic chemotherapy very challenging as the risks outweigh benefits.  Patient and her family members understand this.  Patient is considering palliative radiation.  If they do not want to proceed with radiation, comfort care/hospice would be recommended and patient and family verbalized understanding of this fact.  Thank you for giving us  an opportunity to participate in her care.  Please call us  with any questions or concerns.  Arlo Berber, MD Medical Oncology and Hematology 03/05/2024 4:45 PM   HISTORY OF PRESENT ILLNESS:  Nancy Sutton is a 78 y.o. lady with history of stroke with residual left hemianopsia, hypertension and type 2 diabetes, history of CAD with NSTEMI March 2024 status post stent placement, patient has been recently followed by Dr. Abraham Abo on 6/3 for  surgery workup, has been cleared by cardiology 6/4.  On 02/25/2024, she presented to ED as she has been having difficulty clearing her throat, and worsening neck pain, reports this symptoms been ongoing for last couple weeks, but has worsened over the last couple days, as she is feeling phlegm stuck in her throat, where she has been trying to clear it unable to do so.   She was admitted to the hospital for further evaluation and management.  Dr. [name], otolaryngology evaluated the patient.  Thyroid  cancer was rapidly enlarging and clinical picture was concerning for anaplastic thyroid  carcinoma.  On 02/29/2024, she underwent a tracheostomy and open biopsy of the right thyroid  cancer.  Pathology was resulted today and it confirmed anaplastic thyroid  carcinoma.  Our service was consulted for additional recommendations.  Patient's performance status is ECOG 3 at best.  She has multiple medical comorbidities which precludes systemic chemotherapy.  Recommended palliative radiation versus hospice/comfort care.  Patient leaning to proceed with palliative radiation.  INTERVAL HISTORY:  Patient seen and evaluated.  Her daughter and her husband were by the bedside and her son was on the phone during the visit.  Discussed the diagnosis, poor prognosis, limited treatment options for nonsurgical candidates.  MEDICAL HISTORY Past Medical History:  Diagnosis Date   Arthritis    CAD (coronary artery disease) 03/28/2023   NSTEMI 11/2022 s/p 3 x 16 mm DES to the [facility], 3 x 20 mm DES to the mRCA    - LHC 12/13/2022: RCA ostial 95, mid 90; LCx mid 70; LAD mid 40, distal 70>> Med Rx for LAD, LCx   - TTE 12/09/2022: EF 50-55, no RWMA, mild concentric LVH, GR 2 DD, normal RVSF, mild MR, RAP 15   Diabetes mellitus  since 2008   Essential hypertension, benign 09/10/2015   GERD (gastroesophageal reflux disease)    Hypercholesteremia    Hyperlipidemia    Stroke (HCC) 03/21/2011   only deficits-affected eyesight      SURGICAL HISTORY Past Surgical History:  Procedure Laterality Date   BIOPSY  07/02/2022   Procedure: BIOPSY;  Surgeon: Urban Garden, MD;  [facility];  Service: Gastroenterology;;   CATARACT EXTRACTION W/PHACO  11/01/2011   Procedure: CATARACT EXTRACTION PHACO AND INTRAOCULAR LENS PLACEMENT (IOC);  Surgeon: Anner Kill, MD;  Location: [facility] OR[facility];  Service: Ophthalmology;  Laterality: Right;  CDE 12.48   CORONARY STENT INTERVENTION N/A 12/10/2022   Procedure: CORONARY STENT INTERVENTION;  Surgeon: Arnoldo Lapping, MD;  Location: [facility] INVASIVE CV LAB;  Service: Cardiovascular;  Laterality: N/A;   ESOPHAGEAL DILATION N/A 04/29/2017   Procedure: ESOPHAGEAL DILATION;  Surgeon: Ruby Corporal, MD;  [facility];  Service: Endoscopy;  Laterality: N/A;   ESOPHAGOGASTRODUODENOSCOPY N/A 04/29/2017   Procedure: ESOPHAGOGASTRODUODENOSCOPY (EGD);  Surgeon: Ruby Corporal, MD;  [facility];  Service: Endoscopy;  Laterality: N/A;  1015   ESOPHAGOGASTRODUODENOSCOPY (EGD) WITH ESOPHAGEAL DILATION N/A 02/14/2013   Procedure: ESOPHAGOGASTRODUODENOSCOPY (EGD) WITH ESOPHAGEAL DILATION;  Surgeon: Ruby Corporal, MD;  [facility];  Service: Endoscopy;  Laterality: N/A;  200-moved to 255 Ann to notify pt   ESOPHAGOGASTRODUODENOSCOPY (EGD) WITH PROPOFOL  N/A 05/28/2022   Procedure: ESOPHAGOGASTRODUODENOSCOPY (EGD) WITH PROPOFOL ;  Surgeon: Urban Garden, MD;  [facility];  Service: Gastroenterology;  Laterality: N/A;  1030 ASA 2   ESOPHAGOGASTRODUODENOSCOPY (EGD) WITH PROPOFOL  N/A 07/02/2022   Procedure: ESOPHAGOGASTRODUODENOSCOPY (EGD) WITH PROPOFOL ;  Surgeon: Urban Garden, MD;  [facility]: [facility];  Service: Gastroenterology;  Laterality: N/A;  105 ASA 2, pt knows new time per Hugo Maes   EYE SURGERY  10-2010   left eye removal of cataract   LEFT HEART CATH AND CORONARY ANGIOGRAPHY N/A 12/10/2022   Procedure: LEFT HEART CATH  AND CORONARY ANGIOGRAPHY;  Surgeon: Arnoldo Lapping, MD;  Location: [facility] INVASIVE CV LAB;  Service: Cardiovascular;  Laterality: N/A;   MASS BIOPSY N/A 02/29/2024   Procedure: BIOPSY, THYROID ;  Surgeon: Reynold Caves, MD;  Location: [facility] OR;  Service: ENT;  Laterality: N/A;   TRACHEOSTOMY TUBE PLACEMENT N/A 02/29/2024   Procedure: CREATION, TRACHEOSTOMY;  Surgeon: Reynold Caves, MD;  Location: [facility] OR;  Service: ENT;  Laterality: N/A;  WITH THYROID  BIOPSY   TUBAL LIGATION  1984     ALLERGIES  Allergies  Allergen Reactions   Ampicillin Hives    Breaks out in bumps Has patient had a PCN reaction causing immediate rash, facial/tongue/throat swelling, SOB or lightheadedness with hypotension: No Has patient had a PCN reaction causing severe rash involving mucus membranes or skin necrosis: Yes Has patient had a PCN reaction that required hospitalization: No Has patient had a PCN reaction occurring within the last 10 years: No If all of the above answers are NO, then may proceed with Cephalosporin use.    Bromday [Bromfenac Sodium] Itching and Swelling    Eye Drops   Lisinopril Cough   Tetanus Toxoids Hives    Breaks out in bumps    FAMILY HISTORY  Family History  Problem Relation Age of Onset   Cancer Mother    Diabetes Mother    Cancer Father    Diabetes Father    Anesthesia problems Neg Hx    Hypotension Neg Hx    Malignant hyperthermia Neg  Hx    Pseudochol deficiency Neg Hx      SOCIAL HISTORY   Social History   Socioeconomic History   Marital status: Married    Spouse name: wayne   Number of children: 2   Years of education: 12   Highest education level: Not on file  Occupational History   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   Patient is right handed.   Patient has hs education.   Patient drinks 3-5 glasses of tea daily.   Social Drivers of Corporate investment banker Strain: Not on file  Food Insecurity: No Food Insecurity (02/25/2024)   Hunger Vital Sign    Worried About Running Out of Food in the Last Year: Never true    Ran Out of Food in the Last Year: Never true  Transportation Needs: No Transportation Needs (02/25/2024)   PRAPARE - Administrator, Civil Service (Medical): No    Lack of Transportation (Non-Medical): No  Physical Activity: Not on file  Stress: Not on file  Social Connections: Unknown (02/25/2024)   Social Connection and Isolation Panel    Frequency of Communication with Friends and Family: More than three times a week    Frequency of Social Gatherings with Friends and Family: More than three times a week    Attends Religious Services: Patient declined    Database administrator or Organizations: Patient declined    Attends Banker Meetings: Patient declined    Marital Status: Married  Catering manager Violence: Not At Risk (02/25/2024)   Humiliation, Afraid, Rape, and Kick questionnaire    Fear of Current or Ex-Partner: No    Emotionally Abused: No    Physically Abused: No    Sexually Abused: No    CURRENT MEDICATIONS   Current Outpatient Medications  Medication Instructions   acetaminophen  (TYLENOL ) 500-1,000 mg, Every 8 hours PRN   ascorbic acid (VITAMIN C) 500 mg, Daily   calcium  carbonate (TUMS - DOSED IN MG ELEMENTAL CALCIUM ) 500 MG chewable tablet 1 tablet, At bedtime PRN   cholecalciferol (VITAMIN D3) 10 mcg, Daily   clopidogrel  (PLAVIX ) 75 mg, Daily at bedtime   diltiazem  (CARDIZEM ) 30 mg, Oral, 2 times daily, Start on 04/10/23   ezetimibe (ZETIA) 10 mg, Daily at bedtime   latanoprost   (XALATAN ) 0.005 % ophthalmic solution 1 drop, Daily at bedtime   metFORMIN  (GLUCOPHAGE -XR) 500 MG 24 hr tablet TAKE 1 TABLET BY MOUTH EVERY DAY WITH BREAKFAST   Multiple Vitamin (ONE-A-DAY 55 PLUS PO) 1 tablet, Daily   naproxen sodium (ALEVE) 220 mg, Oral, 2 times daily PRN   NovoLOG  FlexPen 10-12 Units, Subcutaneous, 3 times daily with meals   omeprazole  (PRILOSEC) 40 mg, Oral, Daily   Probiotic Product (PROBIOTIC DAILY PO) 1 tablet, Daily   rosuvastatin  (CRESTOR ) 40 mg, Oral, Daily   timolol  (BETIMOL ) 0.5 % ophthalmic solution 1 drop, 2 times daily   Tresiba  FlexTouch 30 Units, Daily at bedtime   Trulicity  1.5 mg, Subcutaneous, Weekly  vitamin E 400 Units, Daily at bedtime     REVIEW OF SYSTEMS   Review of Systems - Oncology  All other pertinent review of systems is negative except as mentioned above in HPI  PHYSICAL EXAMINATION  ECOG PERFORMANCE STATUS: 3 - Symptomatic, >50% confined to bed  Vitals:   03/05/24 1100 03/05/24 1628  BP: (!) 136/49 (!) 150/61  Pulse: 91 95  Resp:  (!) 22  Temp:  98.3 F (36.8 C)  SpO2: 98% 100%   Filed Weights   03/02/24 1410 03/03/24 0442  Weight: 118 lb 6.2 oz (53.7 kg) 128 lb 4.9 oz (58.2 kg)    Physical Exam Constitutional:      General: She is not in acute distress. HENT:     Head: Normocephalic.  Neck:     Comments: Tracheostomy in place Cardiovascular:     Rate and Rhythm: Normal rate.  Pulmonary:     Effort: No respiratory distress.  Abdominal:     General: There is no distension.   Neurological:     Mental Status: She is alert.     Comments: Left-sided weakness    LABORATORY DATA:   I have reviewed the data as listed  Results for orders placed or performed during the hospital encounter of 02/25/24 (from the past 24 hours)  Glucose, capillary   Collection Time: 03/04/24  8:18 PM  Result Value Ref Range   Glucose-Capillary 307 (H) 70 - 99 mg/dL  Glucose, capillary   Collection Time: 03/05/24 12:16 AM  Result  Value Ref Range   Glucose-Capillary 300 (H) 70 - 99 mg/dL  Glucose, capillary   Collection Time: 03/05/24  4:12 AM  Result Value Ref Range   Glucose-Capillary 393 (H) 70 - 99 mg/dL  CBC with Differential/Platelet   Collection Time: 03/05/24  4:35 AM  Result Value Ref Range   WBC 15.8 (H) 4.0 - 10.5 K/uL   RBC 3.63 (L) 3.87 - 5.11 MIL/uL   Hemoglobin 10.1 (L) 12.0 - 15.0 g/dL   HCT 16.1 (L) 09.6 - 04.5 %   MCV 85.7 80.0 - 100.0 fL   MCH 27.8 26.0 - 34.0 pg   MCHC 32.5 30.0 - 36.0 g/dL   RDW 40.9 81.1 - 91.4 %   Platelets 397 150 - 400 K/uL   nRBC 0.0 0.0 - 0.2 %   Neutrophils Relative % 74 %   Neutro Abs 11.5 (H) 1.7 - 7.7 K/uL   Lymphocytes Relative 13 %   Lymphs Abs 2.1 0.7 - 4.0 K/uL   Monocytes Relative 10 %   Monocytes Absolute 1.6 (H) 0.1 - 1.0 K/uL   Eosinophils Relative 2 %   Eosinophils Absolute 0.4 0.0 - 0.5 K/uL   Basophils Relative 0 %   Basophils Absolute 0.0 0.0 - 0.1 K/uL   Immature Granulocytes 1 %   Abs Immature Granulocytes 0.14 (H) 0.00 - 0.07 K/uL  Comprehensive metabolic panel with GFR   Collection Time: 03/05/24  4:35 AM  Result Value Ref Range   Sodium 132 (L) 135 - 145 mmol/L   Potassium 4.4 3.5 - 5.1 mmol/L   Chloride 98 98 - 111 mmol/L   CO2 25 22 - 32 mmol/L   Glucose, Bld 377 (H) 70 - 99 mg/dL   BUN 36 (H) 8 - 23 mg/dL   Creatinine, Ser 7.82 (H) 0.44 - 1.00 mg/dL   Calcium  8.8 (L) 8.9 - 10.3 mg/dL   Total Protein 6.5 6.5 - 8.1 g/dL   Albumin 2.3 (L)  3.5 - 5.0 g/dL   AST 45 (H) 15 - 41 U/L   ALT 40 0 - 44 U/L   Alkaline Phosphatase 87 38 - 126 U/L   Total Bilirubin 0.2 0.0 - 1.2 mg/dL   GFR, Estimated 57 (L) >60 mL/min   Anion gap 9 5 - 15  Magnesium    Collection Time: 03/05/24  4:35 AM  Result Value Ref Range   Magnesium  2.2 1.7 - 2.4 mg/dL  Phosphorus   Collection Time: 03/05/24  4:35 AM  Result Value Ref Range   Phosphorus 2.7 2.5 - 4.6 mg/dL  Glucose, capillary   Collection Time: 03/05/24  7:26 AM  Result Value Ref Range    Glucose-Capillary 332 (H) 70 - 99 mg/dL  Glucose, capillary   Collection Time: 03/05/24 11:04 AM  Result Value Ref Range   Glucose-Capillary 354 (H) 70 - 99 mg/dL      RADIOGRAPHIC STUDIES:  I have personally reviewed the radiological images as listed and agree with the findings in the report.  DG CHEST PORT 1 VIEW Result Date: 03/04/2024 CLINICAL DATA:  [id number] Fever [id number] EXAM: PORTABLE CHEST 1 VIEW COMPARISON:  February 28, 2024 FINDINGS: The cardiomediastinal silhouette is unchanged in contour.Enteric tube tip projects over the expected area of the proximal duodenum. Tracheostomy. Atherosclerotic calcifications. Enteric contrast delineates a loop of bowel in the LEFT upper quadrant, likely colon. No pleural effusion. No pneumothorax. No acute pleuroparenchymal abnormality. IMPRESSION: No acute cardiopulmonary abnormality. Electronically Signed   By: Clancy Crimes M.D.   On: 03/04/2024 09:08   DG Swallowing Func-Speech Pathology Result Date: 03/03/2024 Table formatting from the original result was not included. Modified Barium Swallow Study Patient Details Name: Nancy Sutton MRN: [id number] Date of Birth: [birth year] Today's Date: 03/03/2024 HPI/PMH: HPI: 78 yo female presenting 6/7 with rapidly enlarging thyroid  mass. CT Neck showed 4.1 x 2.5 x 3.9 cm heterogeneous mass in the right anterior neck at  the level of the thyroid  concerning for residual/recurrent thyroid  malignancy. Associated mass effect and leftward shift of the airway without significant airway narrowing. 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 Associated retropharyngeal edema. Trach done by ENT 6/11. Code stroke was called 6/13 due to L sided weakness although time of onset likely the day prior. BS completed on 6/12 recommending Dys 2, nectar thick liquids but patient the next day, was having difficulty swallowing and had new  onset stroke symptoms. MRI showed Patchy acute ischemic nonhemorrhagic right frontal, parietal, and occipital infarcts, watershed in distribution. Patient made NPO awaiting reevaluation of swallowing with repeat MBS recommended. Large bore NG changed to small bore. PMH includes: GERD, stroke, Barrett's esophagus with esophageal dysphagia s/p dilation, HLD, NSTEMI, PAF, CAD, HTN Clinical Impression: Clinical Impression: As compared to MBS completed on 03/01/24, MBS completed today appeared only mildly different. She continues with limited anterior hyoid movement, reduced laryngeal elevation and incomplete laryngeal vestibule closure. This leads to penetration of thin liquids and nectar thick liquids to level of vocal cords before spontaneously clearing from laryngeal vestibule (PAS 4) as well as an instance each of thin and nectar thick liquid material passing below the vocal cords before spontaneously clearing from laryngeal vestibule (PAS 6). Silent aspiration was observed with thin liquids. (PAS 8) The main difference in today's MBS as compared to MBS on 6/12 was increased delay in transit of puree solid and honey and nectar thick liquid boluses as they transited through pharynx and  through PES. Patient was noticeably tired at beginning of study and did appear to become more fatigued as study progressed, at one point leaning her head against fluoroscopy machine. SLP is recommending continue NPO but allow to continue PRN ice chips as well as floor stock purees/puddings and floor stock nectar thick liquids. SLP will follow for full PO readiness. Factors that may increase risk of adverse event in presence of aspiration Roderick Civatte & Jessy Morocco 2021): Factors that may increase risk of adverse event in presence of aspiration Roderick Civatte & Jessy Morocco 2021): Weak cough; Presence of tubes (ETT, trach, NG, etc.); Frail or deconditioned Recommendations/Plan: Swallowing Evaluation Recommendations Swallowing Evaluation Recommendations  Recommendations: NPO; Ice chips PRN after oral care Medication Administration: Crushed with puree Supervision: Patient able to self-feed; Staff to assist with self-feeding; Full supervision/cueing for swallowing strategies Swallowing strategies  : Place PMSV during PO intake; Slow rate; Small bites/sips Postural changes: Position pt fully upright for meals Oral care recommendations: Oral care BID (2x/day) Treatment Plan Treatment Plan Treatment recommendations: Therapy as outlined in treatment plan below Follow-up recommendations: Outpatient SLP Functional status assessment: Patient has had a recent decline in their functional status and demonstrates the ability to make significant improvements in function in a reasonable and predictable amount of time. Treatment frequency: Min 2x/week Treatment duration: 2 weeks Interventions: Aspiration precaution training; Compensatory techniques; Patient/family education; Trials of upgraded texture/liquids; Diet toleration management by SLP; Respiratory muscle strength training Recommendations Recommendations for follow up therapy are one component of a multi-disciplinary discharge planning process, led by the attending physician.  Recommendations may be updated based on patient status, additional functional criteria and insurance authorization. Assessment: Orofacial Exam: Orofacial Exam Oral Cavity: Oral Hygiene: WFL Oral Cavity - Dentition: Adequate natural dentition Orofacial Anatomy: WFL Oral Motor/Sensory Function: WFL Anatomy: Anatomy: Suspected cervical osteophytes Boluses Administered: Boluses Administered Boluses Administered: Thin liquids (Level 0); Mildly thick liquids (Level 2, nectar thick); Moderately thick liquids (Level 3, honey thick); Puree  Oral Impairment Domain: Oral Impairment Domain Lip Closure: No labial escape Tongue control during bolus hold: Cohesive bolus between tongue to palatal seal Bolus preparation/mastication: Timely and efficient chewing and  mashing Bolus transport/lingual motion: Brisk tongue motion Oral residue: Complete oral clearance Location of oral residue : N/A Initiation of pharyngeal swallow : Valleculae  Pharyngeal Impairment Domain: Pharyngeal Impairment Domain Soft palate elevation: No bolus between soft palate (SP)/pharyngeal wall (PW) Laryngeal elevation: Partial superior movement of thyroid  cartilage/partial approximation of arytenoids to epiglottic petiole Anterior hyoid excursion: Partial anterior movement Epiglottic movement: No inversion Laryngeal vestibule closure: Incomplete, narrow column air/contrast in laryngeal vestibule Pharyngeal stripping wave : Present - complete Pharyngeal contraction (A/P view only): N/A Pharyngoesophageal segment opening: Complete distension and complete duration, no obstruction of flow Tongue base retraction: Trace column of contrast or air between tongue base and PPW Pharyngeal residue: Collection of residue within or on pharyngeal structures Location of pharyngeal residue: Pyriform sinuses; Valleculae  Esophageal Impairment Domain: Esophageal Impairment Domain Esophageal clearance upright position: Complete clearance, esophageal coating Pill: No data recorded Penetration/Aspiration Scale Score: Penetration/Aspiration Scale Score 1.  Material does not enter airway: Puree 2.  Material enters airway, remains ABOVE vocal cords then ejected out: Moderately thick liquids (Level 3, honey thick) 3.  Material enters airway, remains ABOVE vocal cords and not ejected out: Mildly thick liquids (Level 2, nectar thick) 4.  Material enters airway, CONTACTS cords then ejected out: Thin liquids (Level 0); Mildly thick liquids (Level 2, nectar thick) 6.  Material enters airway, passes BELOW cords then  ejected out: Thin liquids (Level 0); Mildly thick liquids (Level 2, nectar thick) 8.  Material enters airway, passes BELOW cords without attempt by patient to eject out (silent aspiration) : Thin liquids (Level 0)  Compensatory Strategies: Compensatory Strategies Compensatory strategies: Yes Right head turn: Ineffective Ineffective Right Head Turn: Thin liquid (Level 0); Mildly thick liquid (Level 2, nectar thick)   General Information: Caregiver present: Yes  Diet Prior to this Study: NPO; Cortrak/Small bore NG tube   No data recorded  Respiratory Status: WFL   No data recorded  History of Recent Intubation: Yes  Behavior/Cognition: Alert; Cooperative; Pleasant mood; Lethargic/Drowsy Self-Feeding Abilities: Able to self-feed Baseline vocal quality/speech: Dysphonic Volitional Cough: Able to elicit Volitional Swallow: Able to elicit Exam Limitations: No limitations Goal Planning: Prognosis for improved oropharyngeal function: Good Barriers to Reach Goals: Overall medical prognosis No data recorded Patient/Family Stated Goal: patient requesting ice chips Consulted and agree with results and recommendations: Patient; Family member/caregiver; Nurse Pain: Pain Assessment Pain Assessment: Faces Faces Pain Scale: 2 Pain Location: burning in throat from coughing Pain Descriptors / Indicators: Burning Pain Intervention(s): Monitored during session End of Session: Start Time:SLP Start Time (ACUTE ONLY): 1255 Stop Time: SLP Stop Time (ACUTE ONLY): 1315 Time Calculation:SLP Time Calculation (min) (ACUTE ONLY): 20 min Charges: SLP Evaluations $ SLP Speech Visit: 1 Visit SLP Evaluations $MBS Swallow: 1 Procedure $ SLP EVAL LANGUAGE/SOUND PRODUCTION: 1 Procedure $Swallowing Treatment: 1 Procedure SLP visit diagnosis: SLP Visit Diagnosis: Dysphagia, oropharyngeal phase (R13.12) Past Medical History: Past Medical History: Diagnosis Date  Arthritis   CAD (coronary artery disease) 03/28/2023  NSTEMI 11/2022 s/p 3 x 16 mm DES to the [facility], 3 x 20 mm DES to the mRCA   - LHC 12/13/2022: RCA ostial 95, mid 90; LCx mid 70; LAD mid 40, distal 70>> Med Rx for LAD, LCx   - TTE 12/09/2022: EF 50-55, no RWMA, mild concentric LVH, GR 2 DD, normal RVSF, mild  MR, RAP 15  Diabetes mellitus   since 2008  Essential hypertension, benign 09/10/2015  GERD (gastroesophageal reflux disease)   Hypercholesteremia   Hyperlipidemia   Stroke (HCC) 03/21/2011  only deficits-affected eyesight Past Surgical History: Past Surgical History: Procedure Laterality Date  BIOPSY  07/02/2022  Procedure: BIOPSY;  Surgeon: Urban Garden, MD;  [facility];  Service: Gastroenterology;;  CATARACT EXTRACTION W/PHACO  11/01/2011  Procedure: CATARACT EXTRACTION PHACO AND INTRAOCULAR LENS PLACEMENT (IOC);  Surgeon: Anner Kill, MD;  Location: [facility];  Service: Ophthalmology;  Laterality: Right;  CDE 12.48  CORONARY STENT INTERVENTION N/A 12/10/2022  Procedure: CORONARY STENT INTERVENTION;  Surgeon: Arnoldo Lapping, MD;  Location: [facility] INVASIVE CV LAB;  Service: Cardiovascular;  Laterality: N/A;  ESOPHAGEAL DILATION N/A 04/29/2017  Procedure: ESOPHAGEAL DILATION;  Surgeon: Ruby Corporal, MD;  Location: [facility] ENDO SUITE;  Service: Endoscopy;  Laterality: N/A;  ESOPHAGOGASTRODUODENOSCOPY N/A 04/29/2017  Procedure: ESOPHAGOGASTRODUODENOSCOPY (EGD);  Surgeon: Ruby Corporal, MD;  Location: [facility] SU[facility];  Service: Endoscopy;  Laterality: N/A;  1015  ESOPHAGOGASTRODUODENOSCOPY (EGD) WITH ESOPHAGEAL DILATION N/A 02/14/2013  Procedure: ESOPHAGOGASTRODUODENOSCOPY (EGD) WITH ESOPHAGEAL DILATION;  Surgeon: Ruby Corporal, MD;  Location: [facility];  Service: Endoscopy;  Laterality: N/A;  200-moved to 255 Ann to notify pt  ESOPHAGOGASTRODUODENOSCOPY (EGD) WITH PROPOFOL  N/A 05/28/2022  Procedure: ESOPHAGOGASTRODUODENOSCOPY (EGD) WITH PROPOFOL ;  Surgeon: Urban Garden, MD;  Location: [facility];  Service: Gastroenterology;  Laterality: N/A;  1030 ASA 2  ESOPHAGOGASTRODUODENOSCOPY (EGD) WITH PROPOFOL  N/A 07/02/2022  Procedure: ESOPHAGOGASTRODUODENOSCOPY (EGD)  WITH PROPOFOL ;  Surgeon: Umberto Ganong, Bearl Limes, MD;  Location: [facility];  Service: Gastroenterology;  Laterality:  N/A;  105 ASA 2, pt knows new time per Hugo Maes  EYE SURGERY  10-2010  left eye removal of cataract  LEFT HEART CATH AND CORONARY ANGIOGRAPHY N/A 12/10/2022  Procedure: LEFT HEART CATH AND CORONARY ANGIOGRAPHY;  Surgeon: Arnoldo Lapping, MD;  Location: [facility] INVASIVE CV LAB;  Service: Cardiovascular;  Laterality: N/A;  MASS BIOPSY N/A 02/29/2024  Procedure: BIOPSY, THYROID ;  Surgeon: Reynold Caves, MD;  Location: [facility] OR;  Service: ENT;  Laterality: N/A;  TRACHEOSTOMY TUBE PLACEMENT N/A 02/29/2024  Procedure: CREATION, TRACHEOSTOMY;  Surgeon: Reynold Caves, MD;  Location: [facility] OR;  Service: ENT;  Laterality: N/A;  WITH THYROID  BIOPSY  TUBAL LIGATION  1984 Jacqualine Mater, MA, CCC-SLP Speech Therapy   MR BRAIN WO CONTRAST Result Date: 03/02/2024 CLINICAL DATA:  Follow-up examination for acute or deficit, stroke. EXAM: MRI HEAD WITHOUT CONTRAST TECHNIQUE: Multiplanar, multiecho pulse sequences of the brain and surrounding structures were obtained without intravenous contrast. COMPARISON:  Comparison made with CTs from earlier the same day. FINDINGS: Brain: Cerebral volume within normal limits. Encephalomalacia and gliosis involving the right occipital lobe, consistent with a chronic right PCA distribution infarct. Patchy restricted diffusion involving the cortical to subcortical aspect of the right frontal, parietal, and occipital lobes, consistent with acute ischemic nonhemorrhagic infarcts. These are watershed in distribution. No associated hemorrhage or significant mass effect. No other evidence for acute or subacute ischemia. No acute or chronic intracranial blood products. No mass lesion, midline shift or mass effect. Ex vacuo dilatation of the right lateral ventricle related to the chronic right PCA territory infarct without hydrocephalus. No extra-axial fluid collection. Pituitary gland within normal limits. Vascular: Loss of normal flow void within the cavernous right ICA related to the previously identified focal occlusion  versus high-grade stenosis (series 5, image 8). Major intracranial vascular flow voids are otherwise maintained. Skull and upper cervical spine: Cranial junction within normal limits. Bone marrow signal intensity normal. No scalp soft tissue abnormality. Sinuses/Orbits: Prior bilateral ocular lens replacement. Paranasal sinuses are largely clear. No mastoid effusion. Other: None. IMPRESSION: 1. Patchy acute ischemic nonhemorrhagic right frontal, parietal, and occipital infarcts, watershed in distribution. 2. Chronic right PCA distribution infarct. 3. Loss of normal flow void within the cavernous right ICA related to the previously identified focal occlusion versus high-grade stenosis. Electronically Signed   By: Virgia Griffins M.D.   On: 03/02/2024 19:08   ECHOCARDIOGRAM COMPLETE Result Date: 03/02/2024    ECHOCARDIOGRAM REPORT   Patient Name:   Nancy [facility] Date of Exam: 03/02/2024 Medical Rec #:  [id number]      Height:       60.0 in Accession #:    [id number]     Weight:       122.6 lb Date of Birth:  1946-05-19      BSA:          1.516 m Patient Age:    78 years       BP:           175/60 mmHg Patient Gender: F              HR:           81 bpm. Exam Location:  Inpatient Procedure: 2D Echo, Color Doppler and Cardiac Doppler (Both Spectral and Color            Flow Doppler were utilized during procedure). Indications:  Stroke  History:        Patient has prior history of Echocardiogram examinations, most                 recent 12/09/2022. NSTEMI.  Sonographer:    Jeralene Mom Referring Phys: 40 ARSHAD N KAKRAKANDY IMPRESSIONS  1. Left ventricular ejection fraction, by estimation, is 55 to 60%. The left ventricle has normal function. The left ventricle has no regional wall motion abnormalities. Left ventricular diastolic parameters were normal.  2. Right ventricular systolic function is normal. The right ventricular size is normal. Tricuspid regurgitation signal is inadequate for assessing PA  pressure.  3. The mitral valve is normal in structure. Trivial mitral valve regurgitation.  4. The aortic valve was not well visualized. Aortic valve regurgitation is not visualized. No aortic stenosis is present.  5. The inferior vena cava is dilated in size with >50% respiratory variability, suggesting right atrial pressure of 8 mmHg. FINDINGS  Left Ventricle: Left ventricular ejection fraction, by estimation, is 55 to 60%. The left ventricle has normal function. The left ventricle has no regional wall motion abnormalities. The left ventricular internal cavity size was normal in size. There is  no left ventricular hypertrophy. Left ventricular diastolic parameters were normal. Right Ventricle: The right ventricular size is normal. No increase in right ventricular wall thickness. Right ventricular systolic function is normal. Tricuspid regurgitation signal is inadequate for assessing PA pressure. Left Atrium: Left atrial size was normal in size. Right Atrium: Right atrial size was normal in size. Pericardium: There is no evidence of pericardial effusion. Mitral Valve: The mitral valve is normal in structure. Trivial mitral valve regurgitation. Tricuspid Valve: The tricuspid valve is normal in structure. Tricuspid valve regurgitation is trivial. Aortic Valve: The aortic valve was not well visualized. Aortic valve regurgitation is not visualized. No aortic stenosis is present. Aortic valve mean gradient measures 4.0 mmHg. Aortic valve peak gradient measures 7.3 mmHg. Aortic valve area, by VTI measures 2.00 cm. Pulmonic Valve: The pulmonic valve was not well visualized. Pulmonic valve regurgitation is not visualized. Aorta: The aortic root is normal in size and structure. Venous: The inferior vena cava is dilated in size with greater than 50% respiratory variability, suggesting right atrial pressure of 8 mmHg. IAS/Shunts: The interatrial septum was not well visualized.  LEFT VENTRICLE PLAX 2D LVIDd:         3.97 cm    Diastology LVIDs:         3.15 cm   LV e' medial:    9.57 cm/s LV PW:         0.83 cm   LV E/e' medial:  10.6 LV IVS:        0.83 cm   LV e' lateral:   13.30 cm/s LVOT diam:     1.70 cm   LV E/e' lateral: 7.6 LV SV:         54 LV SV Index:   36 LVOT Area:     2.27 cm  RIGHT VENTRICLE RV Basal diam:  3.10 cm RV Mid diam:    2.80 cm RV S prime:     11.70 cm/s TAPSE (M-mode): 2.1 cm LEFT ATRIUM             Index        RIGHT ATRIUM           Index LA diam:        3.20 cm 2.11 cm/m   RA Area:     12.00  cm LA Vol (A2C):   43.5 ml 28.69 ml/m  RA Volume:   26.10 ml  17.22 ml/m LA Vol (A4C):   30.6 ml 20.19 ml/m LA Biplane Vol: 36.8 ml 24.28 ml/m  AORTIC VALVE AV Area (Vmax):    1.80 cm AV Area (Vmean):   1.87 cm AV Area (VTI):     2.00 cm AV Vmax:           135.00 cm/s AV Vmean:          89.800 cm/s AV VTI:            0.270 m AV Peak Grad:      7.3 mmHg AV Mean Grad:      4.0 mmHg LVOT Vmax:         107.00 cm/s LVOT Vmean:        73.900 cm/s LVOT VTI:          0.238 m LVOT/AV VTI ratio: 0.88  AORTA Ao Root diam: 2.40 cm MITRAL VALVE MV Area (PHT): 4.31 cm     SHUNTS MV Decel Time: 176 msec     Systemic VTI:  0.24 m MV E velocity: 101.00 cm/s  Systemic Diam: 1.70 cm MV A velocity: 128.00 cm/s MV E/A ratio:  0.79 Carson Clara MD Electronically signed by Carson Clara MD Signature Date/Time: 03/02/2024/11:11:31 AM    Final    CT ANGIO HEAD NECK W WO CM W PERF (CODE STROKE) Result Date: 03/02/2024 CLINICAL DATA:  Initial evaluation for acute neuro deficit. History of thyroid  cancer. EXAM: CT ANGIOGRAPHY HEAD AND NECK CT PERFUSION BRAIN TECHNIQUE: Multidetector CT imaging of the head and neck was performed using the standard protocol during bolus administration of intravenous contrast. Multiplanar CT image reconstructions and MIPs were obtained to evaluate the vascular anatomy. Carotid stenosis measurements (when applicable) are obtained utilizing NASCET criteria, using the distal internal carotid  diameter as the denominator. Multiphase CT imaging of the brain was performed following IV bolus contrast injection. Subsequent parametric perfusion maps were calculated using RAPID software. RADIATION DOSE REDUCTION: This exam was performed according to the departmental dose-optimization program which includes automated exposure control, adjustment of the mA and/or kV according to patient size and/or use of iterative reconstruction technique. CONTRAST:  OMNIPAQUE  IOHEXOL  350 MG/ML SOLN COMPARISON:  Comparison made with CT from earlier the same day as well as prior neck CT from 02/28/2024. FINDINGS: CTA NECK FINDINGS Aortic arch: Visualized aortic arch within normal limits for caliber with standard 3 vessel morphology. Moderate aortic atherosclerosis. No significant stenosis about the origin the great vessels. Right carotid system: Right common and internal carotid arteries are patent without dissection. Atheromatous change about the right carotid bulb without hemodynamically significant greater than 50% stenosis. Left carotid system: Left common and internal carotid arteries are patent without dissection. No hemodynamically significant stenosis about the left carotid artery system. Vertebral arteries: Both vertebral arteries arise from subclavian arteries. Vertebral arteries patent without stenosis or dissection. Skeleton: No worrisome osseous lesions. Mild-to-moderate multilevel spondylosis noted within the visualized cervicothoracic spine. Other neck: Nasogastric tube in place. Heterogeneous enhancing ill-defined mass positioned at the right thyroid  fossa again seen, measuring approximately 3.9 x 2.8 x 3.2 cm. Multiple abnormal and partially necrotic nodes within the adjacent left neck, concerning for nodal metastatic disease. Changes are relatively similar as compared to recent neck CT. Interval placement of a tracheostomy with tip well positioned in the upper airway. Small volume secretions noted within  subglottic trachea distal to the tracheostomy tube.  Upper chest: No other acute finding. Review of the MIP images confirms the above findings CTA HEAD FINDINGS Anterior circulation: Atheromatous change seen about the carotid siphons bilaterally. On the right, there is abrupt cut off of the cavernous right ICA at the level of the anterior genu (series 10, images 103-95). The right ICA is functionally occluded at this level. Distal reconstitution via collateral flow either via the ophthalmic artery or cross the circle-of-Willis. Upon review of prior MRA from 2012, there appears to have been a moderate to severe stenosis at this location. Therefore, finding is favored to reflect a severe high-grade progressive stenosis. Acute superimposed intraluminal thrombus is difficult to exclude. On the left, atheromatous change about the left siphon with resultant moderate to severe stenosis at the para clinoid region (series 10, image 92). A1 segments are diminutive but patent. Anterior communicating artery complex within normal limits. Both ACAs patent to their distal aspects without stenosis. Right ACA dominant. M1 segments patent without stenosis. No proximal SCA branch occlusion. Distal MCA branches perfused and fairly symmetric. Posterior circulation: Both V4 segments patent without stenosis. Left vertebral artery dominant. Both PICA patent. Basilar patent without stenosis. Superior cerebral arteries patent bilaterally. Left PCA primarily supplied via the basilar. Atheromatous change about the left PCA with moderate proximal left P2 stenosis (series 18, image 21). The right PCA is diffusely irregular and attenuated, in keeping with the chronic right PCA territory infarct. Right PCA remains patent to its distal aspect. Venous sinuses: Patent allowing for timing the contrast bolus. Anatomic variants: As above. No visible aneurysm. Review of the MIP images confirms the above findings CT Brain Perfusion Findings: ASPECTS: 10  CBF (<30%) Volume: 0mL Perfusion (Tmax>6.0s) volume: 43mL Mismatch Volume: 43 mL Infarction Location:43 mL areas of delayed perfusion involving the right cerebral hemisphere, somewhat watershed distribution. Finding presumably related to functional occlusion/severe stenosis at the cavernous right ICA as detailed above. No acute core infarct. No other perfusion abnormality IMPRESSION: 1. 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 Distal reconstitution via collateral flow either via the ophthalmic artery or cross the circle-of-Willis. Upon review of prior MRA from 2012, there appears to have been a moderate to severe stenosis at this location. Therefore, this finding is favored to reflect a severe high-grade progressive stenosis although acutely superimposed intraluminal thrombus is difficult to exclude. 2. 43 mL areas of delayed perfusion involving the right cerebral hemisphere, somewhat watershed and distribution. Finding is in keeping with the above described high-grade stenosis/occlusion. No acute core infarct by CT perfusion. 3. 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 4. Diffusely irregular and attenuated right PCA, in keeping with the chronic right PCA territory infarct. 5. Heterogeneous enhancing mass at the right thyroid  fossa, presumably reflecting patient's known thyroid  carcinoma. Scattered partially necrotic lymph nodes within the adjacent right neck, concerning for nodal metastatic disease. Changes are relatively similar as compared to recent neck CT from 02/28/2024. 6. Interval placement of a tracheostomy tube with tip well positioned in the upper airway. 7.  Aortic Atherosclerosis (ICD10-I70.0). Results discussed by telephone at the time of interpretation on 03/02/2024 at 1:35 a.m. to provider ASHISH ARORA. Electronically Signed   By: Virgia Griffins M.D.   On:  03/02/2024 02:05   CT HEAD CODE STROKE WO CONTRAST Result Date: 03/02/2024 CLINICAL DATA:  Code stroke. Initial evaluation for acute neuro deficit, stroke suspected. EXAM: CT HEAD WITHOUT  CONTRAST TECHNIQUE: Contiguous axial images were obtained from the base of the skull through the vertex without intravenous contrast. RADIATION DOSE REDUCTION: This exam was performed according to the departmental dose-optimization program which includes automated exposure control, adjustment of the mA and/or kV according to patient size and/or use of iterative reconstruction technique. COMPARISON:  None Available. FINDINGS: Brain: Cerebral volume within normal limits. Encephalomalacia involving the right occipital lobe, consistent with a chronic right PCA distribution infarct. No acute intracranial hemorrhage. No acute large vessel territory infarct. No mass lesion or midline shift. No hydrocephalus or extra-axial fluid collection. Vascular: No abnormal hyperdense vessel. Calcified atherosclerosis present at the skull base. Skull: Scalp soft tissues within normal limits.  Calvarium intact. Sinuses/Orbits: Globes orbital soft tissues demonstrate no acute finding. Paranasal sinuses are largely clear. No significant mastoid effusion. Nasogastric tube in place. Other: None. ASPECTS [facility] Stroke Program Early CT Score) - Ganglionic level infarction (caudate, lentiform nuclei, internal capsule, insula, M1-M3 cortex): 7 - Supraganglionic infarction (M4-M6 cortex): 3 Total score (0-10 with 10 being normal): 10 IMPRESSION: 1. No acute intracranial abnormality. 2. ASPECTS is 10. 3. Chronic right PCA territory infarct. These results were communicated to Dr. Arora at 1:00 am on 03/02/2024 by text page via the [facility] messaging system. Electronically Signed   By: Virgia Griffins M.D.   On: 03/02/2024 01:01   DG Swallowing Func-Speech Pathology Result Date: 03/01/2024 Table formatting from the original result was not included.  Modified Barium Swallow Study Patient Details Name: Nancy [facility] MRN: [id number] Date of Birth: December 07, 1945 Today's Date: 03/01/2024 HPI/PMH: HPI: 78 yo female presenting 6/7 with rapidly enlarging thyroid  mass. CT Neck showed 4.1 x 2.5 x 3.9 cm heterogeneous mass in the right anterior neck at  the level of the thyroid  concerning for residual/recurrent thyroid  malignancy. Associated mass effect and leftward shift of the airway without significant airway narrowing. 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 Associated retropharyngeal edema. Trach done by ENT 6/11. PMH includes: GERD, stroke, Barrett's esophagus with esophageal dysphagia s/p dilation, HLD, NSTEMI, PAF, CAD, HTN Clinical Impression: Clinical Impression: Pt's oral phase is functional but she has pharyngeal dysphagia, largely characterized by inability to fully close off her airway. She has limited anterior hyoid movement, reduced laryngeal elevation, and with lighter boluses there is no epiglottic inversion (the weight of solid boluses help invert it a little bit, but it is still incomplete). Her laryngeal vestibule closure is incomplete, and liquids enter it during the swallow (or after from small amounts of residue, mostly caught in the valleculae). Aspiration occurs with thin liquids and is inconsistently sensed (PAS 7-8, usually a throat clear as opposed to a reflexive cough). A cued cough does not eject aspirates. A few postures and strategies were attempted but aspiration still occurred with a chin tuck. She did not aspirate when turning her head to the R or when using a supraglottic swallow, but she does still penetrate and she cannot clear penetrates. Pt has penetration that is more shallow with nectar (PAS 3) and honey thick liquids (PAS 2). Even though there is a little frank penetration with nectar thick liquids, almost all of it completely  clears. Considering efficiency and safety with swallowing, recommend starting with Dys 2 diet and nectar thick liquids with PMV in place for all PO intake. SLP will f/u for potential implementation of water  protocol with use of strategies. Factors that may increase risk of adverse event in presence  of aspiration Roderick Civatte & Jessy Morocco 2021): Factors that may increase risk of adverse event in presence of aspiration Roderick Civatte & Jessy Morocco 2021): Weak cough; Presence of tubes (ETT, trach, NG, etc.) Recommendations/Plan: Swallowing Evaluation Recommendations Swallowing Evaluation Recommendations Recommendations: PO diet PO Diet Recommendation: Dysphagia 2 (Finely chopped); Mildly thick liquids (Level 2, nectar thick) Liquid Administration via: Cup; Straw Medication Administration: Crushed with puree Supervision: Patient able to self-feed; Intermittent supervision/cueing for swallowing strategies Swallowing strategies  : Place PMSV during PO intake; Slow rate; Small bites/sips Postural changes: Position pt fully upright for meals Oral care recommendations: Oral care BID (2x/day) Caregiver Recommendations: Avoid jello, ice cream, thin soups, popsicles; Remove water  pitcher; Have oral suction available Treatment Plan Treatment Plan Follow-up recommendations: Outpatient SLP Functional status assessment: Patient has had a recent decline in their functional status and demonstrates the ability to make significant improvements in function in a reasonable and predictable amount of time. Treatment frequency: Min 2x/week Treatment duration: 2 weeks Interventions: Aspiration precaution training; Compensatory techniques; Patient/family education; Trials of upgraded texture/liquids; Diet toleration management by SLP; Respiratory muscle strength training Recommendations Recommendations for follow up therapy are one component of a multi-disciplinary discharge planning process, led by the attending physician.  Recommendations may be updated based  on patient status, additional functional criteria and insurance authorization. Assessment: Orofacial Exam: Orofacial Exam Oral Cavity: Oral Hygiene: WFL Oral Cavity - Dentition: Adequate natural dentition Orofacial Anatomy: WFL Oral Motor/Sensory Function: WFL Anatomy: Anatomy: Suspected cervical osteophytes Boluses Administered: Boluses Administered Boluses Administered: Thin liquids (Level 0); Mildly thick liquids (Level 2, nectar thick); Moderately thick liquids (Level 3, honey thick); Puree; Solid  Oral Impairment Domain: Oral Impairment Domain Lip Closure: No labial escape Tongue control during bolus hold: Cohesive bolus between tongue to palatal seal Bolus preparation/mastication: Timely and efficient chewing and mashing Bolus transport/lingual motion: Brisk tongue motion Oral residue: Complete oral clearance Location of oral residue : N/A Initiation of pharyngeal swallow : Valleculae  Pharyngeal Impairment Domain: Pharyngeal Impairment Domain Soft palate elevation: No bolus between soft palate (SP)/pharyngeal wall (PW) Laryngeal elevation: Partial superior movement of thyroid  cartilage/partial approximation of arytenoids to epiglottic petiole Anterior hyoid excursion: Partial anterior movement Epiglottic movement: No inversion Laryngeal vestibule closure: Incomplete, narrow column air/contrast in laryngeal vestibule Pharyngeal stripping wave : Present - complete Pharyngeal contraction (A/P view only): N/A Pharyngoesophageal segment opening: Complete distension and complete duration, no obstruction of flow Tongue base retraction: Trace column of contrast or air between tongue base and PPW Pharyngeal residue: Collection of residue within or on pharyngeal structures Location of pharyngeal residue: Pyriform sinuses; Valleculae  Esophageal Impairment Domain: Esophageal Impairment Domain Esophageal clearance upright position: Esophageal retention with retrograde flow through the PES (x1 with thin liquisd during  coughing) Pill: No data recorded Penetration/Aspiration Scale Score: Penetration/Aspiration Scale Score 1.  Material does not enter airway: Puree; Solid 2.  Material enters airway, remains ABOVE vocal cords then ejected out: Moderately thick liquids (Level 3, honey thick) 3.  Material enters airway, remains ABOVE vocal cords and not ejected out: Mildly thick liquids (Level 2, nectar thick) 8.  Material enters airway, passes BELOW cords without attempt by patient to eject out (silent aspiration) : Thin liquids (Level 0) Compensatory Strategies: Compensatory Strategies Compensatory strategies: Yes Chin tuck: Ineffective Ineffective Chin Tuck: Thin liquid (Level 0) (trace aspiration still occurred) Right head turn: Ineffective Ineffective Right Head Turn: Thin liquid (Level 0) (did not aspirate, but still penetrated) Supraglottic swallow: Ineffective Ineffective Supraglottic Swallow: Thin liquid (Level 0) (did not aspirate, but  still penetrated)   General Information: Caregiver present: Yes (son)  Diet Prior to this Study: NPO; Large bore NG tube   Temperature : Normal   Respiratory Status: WFL   Supplemental O2: Trach   History of Recent Intubation: Yes  Behavior/Cognition: Alert; Cooperative; Pleasant mood Self-Feeding Abilities: Able to self-feed Baseline vocal quality/speech: Normal Volitional Cough: Able to elicit Volitional Swallow: Able to elicit Exam Limitations: No limitations Goal Planning: Prognosis for improved oropharyngeal function: Good Barriers to Reach Goals: Overall medical prognosis (will need to see what her POC may be related to thyroid  mass) No data recorded Patient/Family Stated Goal: none stated Consulted and agree with results and recommendations: Patient; Family member/caregiver; Nurse Pain: Pain Assessment Pain Assessment: Faces Faces Pain Scale: 0 End of Session: Start Time:SLP Start Time (ACUTE ONLY): 1346 Stop Time: SLP Stop Time (ACUTE ONLY): 1406 Time Calculation:SLP Time Calculation  (min) (ACUTE ONLY): 20 min Charges: SLP Evaluations $ SLP Speech Visit: 1 Visit SLP Evaluations $BSS Swallow: 1 Procedure $MBS Swallow: 1 Procedure $ SLP Eval Voice Prosthetic Device: Procedure $$ Passy Muir Speaking Valve: yes SLP visit diagnosis: SLP Visit Diagnosis: Dysphagia, pharyngeal phase (R13.13) Past Medical History: Past Medical History: Diagnosis Date  Arthritis   CAD (coronary artery disease) 03/28/2023  NSTEMI 11/2022 s/p 3 x 16 mm DES to the [facility], 3 x 20 mm DES to the mRCA   - LHC 12/13/2022: RCA ostial 95, mid 90; LCx mid 70; LAD mid 40, distal 70>> Med Rx for LAD, LCx   - TTE 12/09/2022: EF 50-55, no RWMA, mild concentric LVH, GR 2 DD, normal RVSF, mild MR, RAP 15  Diabetes mellitus   since 2008  Essential hypertension, benign 09/10/2015  GERD (gastroesophageal reflux disease)   Hypercholesteremia   Hyperlipidemia   Stroke (HCC) 03/21/2011  only deficits-affected eyesight Past Surgical History: Past Surgical History: Procedure Laterality Date  BIOPSY  07/02/2022  Procedure: BIOPSY;  Surgeon: Urban Garden, MD;  [facility]: [facility];  Service: Gastroenterology;;  CATARACT EXTRACTION W/PHACO  11/01/2011  Procedure: CATARACT EXTRACTION PHACO AND INTRAOCULAR LENS PLACEMENT (IOC);  Surgeon: Anner Kill, MD;  Location: [facility] OR[facility];  Service: Ophthalmology;  Laterality: Right;  CDE 12.48  CORONARY STENT INTERVENTION N/A 12/10/2022  Procedure: CORONARY STENT INTERVENTION;  Surgeon: Arnoldo Lapping, MD;  Location: [facility] INVASIVE CV LAB;  Service: Cardiovascular;  Laterality: N/A;  ESOPHAGEAL DILATION N/A 04/29/2017  Procedure: ESOPHAGEAL DILATION;  Surgeon: Ruby Corporal, MD;  Location: [facility];  Service: Endoscopy;  Laterality: N/A;  ESOPHAGOGASTRODUODENOSCOPY N/A 04/29/2017  Procedure: ESOPHAGOGASTRODUODENOSCOPY (EGD);  Surgeon: Ruby Corporal, MD;  [facility];  Service: Endoscopy;  Laterality: N/A;  1015  ESOPHAGOGASTRODUODENOSCOPY (EGD) WITH ESOPHAGEAL DILATION N/A 02/14/2013   Procedure: ESOPHAGOGASTRODUODENOSCOPY (EGD) WITH ESOPHAGEAL DILATION;  Surgeon: Ruby Corporal, MD;  [facility];  Service: Endoscopy;  Laterality: N/A;  200-moved to 255 Ann to notify pt  ESOPHAGOGASTRODUODENOSCOPY (EGD) WITH PROPOFOL  N/A 05/28/2022  Procedure: ESOPHAGOGASTRODUODENOSCOPY (EGD) WITH PROPOFOL ;  Surgeon: Urban Garden, MD;  [facility];  Service: Gastroenterology;  Laterality: N/A;  1030 ASA 2  ESOPHAGOGASTRODUODENOSCOPY (EGD) WITH PROPOFOL  N/A 07/02/2022  Procedure: ESOPHAGOGASTRODUODENOSCOPY (EGD) WITH PROPOFOL ;  Surgeon: Urban Garden, MD;  [facility];  Service: Gastroenterology;  Laterality: N/A;  105 ASA 2, pt knows new time per Hugo Maes  EYE SURGERY  10-2010  left eye removal of cataract  LEFT HEART CATH AND CORONARY ANGIOGRAPHY N/A 12/10/2022  Procedure: LEFT HEART CATH AND CORONARY  ANGIOGRAPHY;  Surgeon: Arnoldo Lapping, MD;  Location: [facility] INVASIVE CV LAB;  Service: Cardiovascular;  Laterality: N/A;  MASS BIOPSY N/A 02/29/2024  Procedure: BIOPSY, THYROID ;  Surgeon: Reynold Caves, MD;  Location: [facility] OR;  Service: ENT;  Laterality: N/A;  TRACHEOSTOMY TUBE PLACEMENT N/A 02/29/2024  Procedure: CREATION, TRACHEOSTOMY;  Surgeon: Reynold Caves, MD;  Location: [facility] OR;  Service: ENT;  Laterality: N/A;  WITH THYROID  BIOPSY  TUBAL LIGATION  1984 Beth Brooke., M.A. CCC-SLP Acute Rehabilitation Services Office: [phone] Secure chat preferred 03/01/2024, 3:23 PM  DG Abd Portable 1V Result Date: 02/29/2024 CLINICAL DATA:  NG placement. EXAM: PORTABLE ABDOMEN - 1 VIEW COMPARISON:  None Available. FINDINGS: Partially visualized enteric tube with tip likely in the distal stomach. No bowel dilatation. Air is noted in the colon. Degenerative changes of the spine. No acute osseous pathology. IMPRESSION: Enteric tube with tip likely in the distal stomach. Electronically Signed   By: Angus Bark M.D.   On: 02/29/2024 14:30   CT SOFT TISSUE NECK W CONTRAST Result  Date: 02/28/2024 CLINICAL DATA:  Thyroid  cancer staging. Soft tissue infection of the neck is suspected. EXAM: CT NECK WITH CONTRAST TECHNIQUE: Multidetector CT imaging of the neck was performed using the standard protocol following the bolus administration of intravenous contrast. RADIATION DOSE REDUCTION: This exam was performed according to the departmental dose-optimization program which includes automated exposure control, adjustment of the mA and/or kV according to patient size and/or use of iterative reconstruction technique. CONTRAST:  75mL OMNIPAQUE  IOHEXOL  350 MG/ML SOLN COMPARISON:  CT of the neck dated February 25, 2024. FINDINGS: A peripherally enhancing, centrally hypodense lesion is again demonstrated within the right thyroid  fossa measuring approximately 4.3 x 2.6 x 3.7 cm. There is an ovoid calculus present within the lesion, which measures approximately 13 x 9 x 14 mm. The collection abuts the right lateral surface of the trachea and causes left lateral displacement, as before. Pharynx and larynx: There has been interval improvement of retropharyngeal edema. There is extrinsic compression of the left piriform sinus. The laryngeal soft tissues are unremarkable. Salivary glands: No inflammation, mass, or stone. Thyroid : Patent small nodules again demonstrated within the left lobe of the thyroid . The right thyroid  fossa is occupied by the peripherally enhancing lesion described above. Lymph nodes: There are mildly enlarged centrally hypodense right level 2 and 3 lymph nodes again demonstrated, which measure up to approximately 11 mm in short axis. There are no abnormally enlarged or morphologically suspicious lymph nodes present on the left. Vascular: Patent and grossly unremarkable. Limited intracranial: Negative. Visualized orbits: Status post bilateral lens replacement. Mastoids and visualized paranasal sinuses: Clear. 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  No osseous lesions are present. Upper chest: The lung apices are clear. Other: None. IMPRESSION: 1. Persistent irregular, peripherally enhancing, centrally hypodense lesion within the right thyroid  fossa, with associated mild right cervical lymphadenopathy. The lymph nodes are centrally hypodense and may represent suppurative or necrotic nodes. The findings are concerning for metastatic thyroid  cancer, right thyroid  abscess with suppurative lymphadenopathy or a combination of the two. Electronically Signed   By: Maribeth Shivers M.D.   On: 02/28/2024 13:44   CT CHEST ABDOMEN PELVIS W CONTRAST Result Date: 02/28/2024 CLINICAL DATA:  Thyroid  cancer.  Metastatic disease evaluation. EXAM: CT CHEST, ABDOMEN, AND PELVIS WITH CONTRAST TECHNIQUE: Multidetector CT imaging of the chest, abdomen and pelvis was performed following the standard protocol during bolus administration of intravenous contrast. RADIATION DOSE REDUCTION: This exam was performed according  to the departmental dose-optimization program which includes automated exposure control, adjustment of the mA and/or kV according to patient size and/or use of iterative reconstruction technique. CONTRAST:  75mL OMNIPAQUE  IOHEXOL  350 MG/ML SOLN COMPARISON:  Chest radiograph dated 1625.  Neck CT dated 02/25/2024. FINDINGS: CT CHEST FINDINGS Cardiovascular: No cardiomegaly or pericardial effusion. There is 2 vessel coronary vascular calcification. Advanced atherosclerotic calcification of the thoracic aorta. No aneurysmal dilatation or dissection. The origins of the great vessels of the aortic arch and the central pulmonary arteries are patent. Mediastinum/Nodes: No hilar or mediastinal adenopathy. Small hiatal hernia. The esophagus is grossly unremarkable. Partially visualized hypodense mass or collection in the right lateral neck. See neck CT report. Lungs/Pleura: No focal consolidation, pleural effusion, pneumothorax. The central airways are patent.  Musculoskeletal: Osteopenia with degenerative changes of the spine. No acute osseous pathology. CT ABDOMEN PELVIS FINDINGS No intra-abdominal free air or free fluid. Hepatobiliary: Small liver cysts. No biliary dilatation. Multiple gallstones. No pericholecystic fluid or evidence of acute cholecystitis by CT. Pancreas: Unremarkable. No pancreatic ductal dilatation or surrounding inflammatory changes. Spleen: Normal in size without focal abnormality. Adrenals/Urinary Tract: The adrenal glands unremarkable. There is no hydronephrosis on either side. There is symmetric enhancement and excretion of contrast by both kidneys. The visualized ureters and urinary bladder appear unremarkable. Stomach/Bowel: Small hiatal hernia. There is no bowel obstruction or active inflammation. The appendix is normal. Vascular/Lymphatic: Advanced aortoiliac atherosclerotic disease. The IVC is unremarkable. No portal venous gas. There is no adenopathy. Reproductive: The uterus is grossly unremarkable. No suspicious adnexal masses. Other: None Musculoskeletal: Osteopenia with degenerative changes of spine. No acute osseous pathology. IMPRESSION: 1. No acute intrathoracic, abdominal, or pelvic pathology. No metastatic disease in the chest, abdomen, or pelvis. 2. Partially visualized right neck mass/collection. See neck CT report. 3. Cholelithiasis. 4.  Aortic Atherosclerosis (ICD10-I70.0). Electronically Signed   By: Angus Bark M.D.   On: 02/28/2024 13:22   DG Chest Port 1 View Result Date: 02/28/2024 CLINICAL DATA:  Shortness of breath. EXAM: PORTABLE CHEST 1 VIEW COMPARISON:  02/26/2024 FINDINGS: The lungs are clear without focal pneumonia, edema, pneumothorax or pleural effusion. Cardiopericardial silhouette is at upper limits of normal for size. No acute bony abnormality. Telemetry leads overlie the chest. IMPRESSION: No active disease. Electronically Signed   By: Donnal Fusi M.D.   On: 02/28/2024 06:15   DG Chest Port 1  View Result Date: 02/26/2024 CLINICAL DATA:  [id number] SOB (shortness of breath) [id number] EXAM: PORTABLE CHEST - 1 VIEW COMPARISON:  07/04/2007. FINDINGS: Cardiac silhouette is unremarkable. No pneumothorax or pleural effusion. The lungs are clear. Aorta is calcified. The visualized skeletal structures are unremarkable. IMPRESSION: No acute cardiopulmonary process. Electronically Signed   By: Sydell Eva M.D.   On: 02/26/2024 06:37   CT Soft Tissue Neck W Contrast Result Date: 02/25/2024 CLINICAL DATA:  Thyroid  nodule, recent diagnosis of thyroid  cancer. Neck pain, throat clearing, concern for new mass or swelling. EXAM: CT NECK WITH CONTRAST TECHNIQUE: Multidetector CT imaging of the neck was performed using the standard protocol following the bolus administration of intravenous contrast. RADIATION DOSE REDUCTION: This exam was performed according to the departmental dose-optimization program which includes automated exposure control, adjustment of the mA and/or kV according to patient size and/or use of iterative reconstruction technique. CONTRAST:  75mL OMNIPAQUE  IOHEXOL  300 MG/ML  SOLN COMPARISON:  None Available. FINDINGS: There is a 4.1 x 2.5 x 3.9 cm heterogeneous centrally hypoattenuating mass centered in the right lower neck at the level  of the thyroid  which demonstrates mild areas of peripherally enhancing soft tissue. There is a prominent calcification within the central/posterior aspect of the mass measuring up to 1.2 cm in diameter. There is associated mass effect with leftward deviation of the airway from the supraglottic airway to the extra thoracic trachea. The medial aspect of the mass abuts and possibly involves the right aspect of the thyroid  cartilage. There is retropharyngeal extension of the mass at the level of the cricoid cartilage. The mass abuts the right lateral aspect of the hypopharynx and cervical esophagus. There is retropharyngeal edema noted extending from C2 to the level of C5  measuring up to 6 mm in thickness. There is additional edema abutting the right carotid space without evidence of significant vascular narrowing. Additional masslike peripherally enhancing soft tissue involving the right sternocleidomastoid muscle observed on series 2 images 52-60. No retrosternal extension of the mass. Pharynx and larynx: Large thyroid  mass as above with mass effect on the airway. The nasopharynx is symmetric. Symmetric appearance of the palatine tonsils. The oral cavity, floor of mouth, and base of tongue are unremarkable. Normal appearance of the epiglottis. Asymmetry of the piriform sinuses noted. The paraglottic fat appears unremarkable. There is asymmetry of the right laryngeal ventricle with slight medial positioning of the right vocal folds. Salivary glands: No inflammation, mass, or stone. Thyroid : Right thyroid  mass as above. Subcentimeter nodule in the inferior left thyroid  lobe. Lymph nodes: Right level 2A cervical node measuring 0.8 cm in short axis with possible central hypoattenuation concerning for central necrosis (series 2, image 42). Additional right level 2 B/3 cervical nodes measuring up to 1.0 cm in short axis. Vascular: Moderate atherosclerosis of the visualized aortic arch. Thyroid  mass abuts the right common carotid artery with lateral displacement of the vessel. There is edema surrounding the right common carotid artery with questionable wall thickening without evidence of vascular narrowing. Atherosclerosis at the carotid bifurcation likely with at least moderate stenosis. Limited intracranial: Limited visualization of intracranial structures without acute abnormality. Visualized orbits: Bilateral lens replacement. Orbits otherwise unremarkable. Mastoids and visualized paranasal sinuses: Mild mucosal thickening in the ethmoid sinuses. Mastoid air cells are clear. Skeleton: No acute or aggressive process. Upper chest: Negative. Other: None. IMPRESSION: 4.1 x 2.5 x 3.9 cm  heterogeneous mass in the right anterior neck at the level of the thyroid  concerning for residual/recurrent thyroid  malignancy. Associated mass effect and leftward shift of the airway without significant airway narrowing. Irregularity of the right aspect of the thyroid  cartilage adjacent to the mass concerning for possible invasion. 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 Recommend correlation with direct visualization. Retropharyngeal extension of the mass which abuts the lower hypopharynx/cervical esophagus. Associated retropharyngeal edema. Edema noted along the right carotid space without significant vascular narrowing. Enlarged right level 2 and level 3 cervical nodes several of which demonstrate hypoattenuation concerning for metastatic lymphadenopathy with central necrosis. Lateral displacement of the right common carotid artery. Edema along the vessel with possible wall thickening which may reflect reactive inflammatory changes. No significant irregularity or narrowing to suggest vascular invasion. Electronically Signed   By: Denny Flack M.D.   On: 02/25/2024 13:47     This document was completed utilizing speech recognition software. Grammatical errors, random word insertions, pronoun errors, and incomplete sentences are an occasional consequence of this system due to software limitations, ambient noise, and hardware issues. Any formal questions or concerns about the content, text or information contained within the body of this dictation should be directly addressed  to the provider for clarification.

## 2024-03-05 NOTE — Progress Notes (Signed)
 Physical Therapy Treatment Patient Details Name: Nancy Sutton MRN: [id number] DOB: 01/13/1946 Today's Date: 03/05/2024   History of Present Illness 78 yo female presents to [facility] 6/7 with SOB. 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 S/p trach 6/10. 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 PMH includes CVA with residual L hemianopsia, HTN, DMII, CAD s/p NSTEMI and stenting 2024, OA.    PT Comments  Pt seen for PT tx with pt's husband & daughter Carmelina Chinchilla) present for session. Pt presents with decreased attention to R but is able to scan & turn head to L with cuing. Pt eager to sit EOB, requires max assist for supine<>sit. Pt tolerates sitting EOB ~12 minutes with min assist with multimodal cuing for midline orientation & to correct L lateral lean & occasional posterior LOB. Pt engaged in reaching to R with RUE with focus on correcting L lateral lean. Once assisted back to bed, pt noted to have BM; pt rolled L<>R with cuing for use of bed rails to allow PT & NT to perform peri hygiene total assist. Educated Cindy on how to adjust bed to chair position to allow pt to sit upright as she would like. Continue to recommend acute PT to progress endurance, balance, & transfers.     If plan is discharge home, recommend the following: Two people to help with walking and/or transfers;Two people to help with bathing/dressing/bathroom;Assistance with feeding;Assistance with cooking/housework;Direct supervision/assist for medications management;Assist for transportation;Supervision due to cognitive status   Can travel by private vehicle     No  Equipment Recommendations  Wheelchair (measurements PT);Hospital bed;Hoyer lift;BSC/3in1;Wheelchair cushion (measurements PT);Rolling walker (2 wheels)    Recommendations for Other Services       Precautions /  Restrictions Precautions Precautions: Fall Precaution/Restrictions Comments: NG tube, trach with PMSV Restrictions Weight Bearing Restrictions Per Provider Order: No     Mobility  Bed Mobility Overal bed mobility: Needs Assistance Bed Mobility: Rolling Rolling: Max assist, Total assist (max assist roll L, total assist roll R)   Supine to sit: Max assist, HOB elevated, Used rails Sit to supine: Max assist, HOB elevated, Used rails   General bed mobility comments: PT provides multimodal cuing for sequencing/technique, pt requires total assist to move LLE but pt able to participate in moving RLE to EOB, requires max assist to upright trunk.    Transfers                        Ambulation/Gait                   Stairs             Wheelchair Mobility     Tilt Bed    Modified Rankin (Stroke Patients Only)       Balance Overall balance assessment: Needs assistance Sitting-balance support: Feet supported, Single extremity supported, Bilateral upper extremity supported Sitting balance-Leahy Scale: Poor                                      Communication Communication Communication: Impaired Factors Affecting Communication: Trach/intubated (utilized PMSV during session without issue)  Cognition Arousal: Alert Behavior During Therapy: WFL for tasks assessed/performed  PT - Cognition Comments: Follows questions/cuing throughout session with extra time, decreased attention to L, head resting in R cervical rotation. Following commands: Impaired Following commands impaired: Follows one step commands with increased time    Cueing Cueing Techniques: Verbal cues, Gestural cues, Tactile cues, Visual cues  Exercises      General Comments        Pertinent Vitals/Pain Pain Assessment Pain Assessment: No/denies pain    Home Living                          Prior Function             PT Goals (current goals can now be found in the care plan section) Acute Rehab PT Goals Patient Stated Goal: Patient unable to state goal. Husband reported that patient has been wanting to sit up on side of bed. PT Goal Formulation: With patient/family Time For Goal Achievement: 03/17/24 Potential to Achieve Goals: Fair Progress towards PT goals: Progressing toward goals    Frequency    Min 2X/week      PT Plan      Co-evaluation              AM-PAC PT 6 Clicks Mobility   Outcome Measure  Help needed turning from your back to your side while in a flat bed without using bedrails?: A Lot Help needed moving from lying on your back to sitting on the side of a flat bed without using bedrails?: Total Help needed moving to and from a bed to a chair (including a wheelchair)?: Total Help needed standing up from a chair using your arms (e.g., wheelchair or bedside chair)?: Total Help needed to walk in hospital room?: Total Help needed climbing 3-5 steps with a railing? : Total 6 Click Score: 7    End of Session   Activity Tolerance: Patient tolerated treatment well Patient left: in bed;with call bell/phone within reach;with bed alarm set;with family/visitor present Nurse Communication: Mobility status PT Visit Diagnosis: Muscle weakness (generalized) (M62.81);Hemiplegia and hemiparesis;Difficulty in walking, not elsewhere classified (R26.2);Other abnormalities of gait and mobility (R26.89) Hemiplegia - Right/Left: Left Hemiplegia - caused by: Cerebral infarction     Time: 9604-5409 PT Time Calculation (min) (ACUTE ONLY): 27 min  Charges:    $Therapeutic Activity: 23-37 mins PT General Charges $$ ACUTE PT VISIT: 1 Visit                     Emaline Handsome, PT, DPT 03/05/24, 12:47 PM  Venetta Gill 03/05/2024, 12:45 PM

## 2024-03-05 NOTE — Progress Notes (Signed)
 Speech Language Pathology Treatment: Dysphagia  Patient Details Name: JAELYNE DEEG MRN: [id number] DOB: [birth year] Today's Date: 03/05/2024 Time: 6578-4696 SLP Time Calculation (min) (ACUTE ONLY): 26 min  Assessment / Plan / Recommendation Clinical Impression  Pt seen for ongoing dysphagia and PMSV management.  Pt with valve off on arrival and cuff deflated.  SLP placed valve.  Noisy breathing observed. Slight wet vocal quality which improved with throat clear.  Pt brought secretions up for oral suctioning.  Husband stated valve removed earlier 2/2 increased work of breathing.  Pt tolerated very small amounts of puree and NTL with valve in place.  ENT arrived and trach changed to cuffless.  Pt expectorated secretions at tracheostomy with ENT providing suction during change.  Significant and immediate improvement in breath sounds and vocal quality with cuffless trach.  Increased vocal intensity, clear vocal quality.  Spoke with pt about diet preference given paucity of puree trials.  Pt states she would love to have some broth.  She might like vanilla pudding as well.  Will initiate modified liquid (NTL) diet. SLP will follow for readiness for solids if desired.  Pt is awaiting results of pathology at this time to determine goals of care.  Recommend full liquid diet, nectar/mildly thick.  Wear PMSV with PO intake.   Pt may have ice chips in moderation, in between meals (wait ~30 mintues), after good oral care, with PMSV in place, when fully awake/alert, with upright positioning and direct supervision.  Pt may wear PMSV as tolerated during waking hours with intermittent supervision.    HPI HPI: 78 yo female presenting 6/7 with rapidly enlarging thyroid  mass. CT Neck showed 4.1 x 2.5 x 3.9 cm heterogeneous mass in the right anterior neck at  the level of the thyroid  concerning for residual/recurrent thyroid  malignancy. Associated mass effect and leftward shift of the airway without significant airway  narrowing. 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 Associated retropharyngeal edema. Trach done by ENT 6/11. Code stroke was called 6/13 due to L sided weakness although time of onset likely the day prior. BS completed on 6/12 recommending Dys 2, nectar thick liquids but patient the next day, was having difficulty swallowing and had new onset stroke symptoms. MRI showed Patchy acute ischemic nonhemorrhagic right frontal, parietal, and occipital infarcts, watershed in distribution. Patient made NPO awaiting reevaluation of swallowing with repeat MBS recommended. Large bore NG changed to small bore. PMH includes: GERD, stroke, Barrett's esophagus with esophageal dysphagia s/p dilation, HLD, NSTEMI, PAF, CAD, HTN      SLP Plan  Continue with current plan of care          Recommendations  Diet recommendations: Nectar-thick liquid Liquids provided via: Straw;Cup Medication Administration: Via alternative means (crush with puree if necessary) Compensations: Slow rate;Small sips/bites Postural Changes and/or Swallow Maneuvers: Seated upright 90 degrees      Patient may use Passy-Muir Speech Valve: During all waking hours (remove during sleep)           Oral care prior to ice chip/H20;Oral care before and after PO;Oral care BID   Intermittent Supervision/Assistance Dysphagia, oropharyngeal phase (R13.12);Aphonia (R49.1)     Continue with current plan of care     Elester Grim, MA, CCC-SLP Acute Rehabilitation Services Office: [phone] 03/05/2024, 10:36 AM

## 2024-03-05 NOTE — Progress Notes (Signed)
 Subjective: No new issues overnight. 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  Objective: Vital signs in last 24 hours: Temp:  [98.9 F (37.2 C)-100.3 F (37.9 C)] 99.2 F (37.3 C) (06/16 0728) Pulse Rate:  [84-105] 93 (06/16 0742) Resp:  [16-20] 18 (06/16 0742) BP: (109-134)/(44-81) 128/47 (06/16 0728) SpO2:  [93 %-97 %] 96 % (06/16 0742) FiO2 (%):  [21 %] 21 % (06/16 0742)  Exam: General: Sleepy but arousable. Eyes: Pupils are equal, round, reactive to light. Extraocular motion is intact.  Ears: Examination of the ears shows normal auricles and external auditory canals bilaterally. Nose: Nasal examination shows normal mucosa, septum, turbinates.  A Dobhoff tube is in place. Face: Facial examination shows no asymmetry. Palpation of the face elicit no significant tenderness.  Mouth: Oral cavity examination shows no mucosal abnormalities.  Neck: Palpation of the neck reveals a large firm and fixed mass within the right thyroid  lobe.  The tracheostomy tube is in place and patent.  No bleeding is noted.  Recent Labs    03/04/24 0549 03/05/24 0435  WBC 14.1* 15.8*  HGB 10.3* 10.1*  HCT 31.8* 31.1*  PLT 389 397   Recent Labs    03/04/24 0549 03/05/24 0435  NA 132* 132*  K 4.5 4.4  CL 99 98  CO2 26 25  GLUCOSE 380* 377*  BUN 28* 36*  CREATININE 0.91 1.01*  CALCIUM  8.9 8.8*    Medications: I have reviewed the patient's current medications. Scheduled:  aspirin   81 mg Per Tube Daily   clopidogrel   75 mg Oral Daily   diltiazem   30 mg Per Tube BID   famotidine   40 mg Per Tube Daily   feeding supplement (PROSource TF20)  60 mL Per Tube Daily   free water   100 mL Per Tube Q4H   heparin   5,000 Units Subcutaneous Q8H   insulin  aspart  0-20 Units Subcutaneous Q4H   insulin  glargine-yfgn  20 Units Subcutaneous QHS   latanoprost   1 drop Both Eyes QHS   levofloxacin  750 mg Oral Q48H   metroNIDAZOLE   500 mg Oral Q12H   multivitamin with minerals  1 tablet Per  Tube Daily   mouth rinse  15 mL Mouth Rinse 4 times per day   polyethylene glycol  17 g Per Tube Daily   rosuvastatin   40 mg Per Tube Daily   timolol   1 drop Both Eyes BID   traZODone  50 mg Per Tube QHS   Continuous:  feeding supplement (OSMOLITE 1.5 CAL) 45 mL/hr at 03/03/24 1719    Assessment/Plan: Rapidly enlarging anaplastic thyroid  carcinoma, s/p trach. - Recent acute stroke. The family and neurology have decided to proceed with no intervention. Pt with left sided weakness. - Trach tube changed to an uncuffed Shiley tube today. - With her clinical condition, pt is not a surgical candidate. - Consider palliative consult and comfort care. - Medical and radiation oncology consultations.   LOS: 9 days   Nancy Sutton Nancy Sutton 03/05/2024, 10:23 AM

## 2024-03-05 NOTE — Progress Notes (Signed)
 Radiation Oncology         [phone] ________________________________  Initial inpatient Consultation  Name: Nancy Sutton MRN: [id number]  Date: 03/06/2024  DOB: November 07, 1945  CC:Bertell Satterfield, MD  Bertell Satterfield, MD   REFERRING PHYSICIAN: Bertell Satterfield, MD  DIAGNOSIS:    ICD-10-CM   1. Anaplastic thyroid  carcinoma (HCC)  C73      Anaplastic thyroid  carcinoma (HCC)  Staging incomplete  CHIEF COMPLAINT: Here to discuss management of thyroid  cancer  HISTORY OF PRESENT ILLNESS::Nancy Sutton is a 78 y.o. female with known arotid vascular disease and undergoes carotid duplex studies on a regular basis. Incidental finding was made of thyroid  nodules.   Subsequently, she underwent a thyroid  ultrasound on// which a normal-sized thyroid  gland with a predominant solid nodule in the mid right thyroid  lobe measuring 1.8 cm in size. She then underwent a fine-needle aspiration biopsy on // which demonstrated malignant cells consistent with thyroid  carcinoma, Bethesda categery VI.     She was then referred to Dr. Krystal Spinner. Upon discussion, patient opted to proceed with a total thyroidectomy with limited central compartment lymph node dissection.  Patient presents to ED on 02/25/24 as she has been having difficulty clearing her throat, and worsening neck pain. Patient was then admitted for inpatient care since then. She underwent a CT neck significant for thyroid  mass, with mass effect, and right vocal cord paralysis, and retropharyngeal spread abuts cervical esophagus.   In light of findings, she then underwent a tracheostomy on 6/11 under the care of Dr. Franky Bald. Surgical pathology revealed anaplastic thyroid  carcinoma, with p53 overexpression.  The tumor is PAX8 positive.  PREVIOUS RADIATION THERAPY: No  PAST MEDICAL HISTORY:  has a past medical history of Arthritis, CAD (coronary artery disease) (03/28/2023), Diabetes mellitus, Essential hypertension, benign (09/10/2015), GERD  (gastroesophageal reflux disease), Hypercholesteremia, Hyperlipidemia, and Stroke (HCC) (03/21/2011).    PAST SURGICAL HISTORY: Past Surgical History:  Procedure Laterality Date   BIOPSY  07/02/2022   Procedure: BIOPSY;  Surgeon: Eartha Angelia Sieving, MD;  [facility];  Service: Gastroenterology;;   CATARACT EXTRACTION W/PHACO  11/01/2011   Procedure: CATARACT EXTRACTION PHACO AND INTRAOCULAR LENS PLACEMENT (IOC);  Surgeon: Cherene Mania, MD;  Location: [facility];  Service: Ophthalmology;  Laterality: Right;  CDE 12.48   CORONARY STENT INTERVENTION N/A 12/10/2022   Procedure: CORONARY STENT INTERVENTION;  Surgeon: Wonda Sharper, MD;  Location: [facility] INVASIVE CV LAB;  Service: Cardiovascular;  Laterality: N/A;   ESOPHAGEAL DILATION N/A 04/29/2017   Procedure: ESOPHAGEAL DILATION;  Surgeon: Golda Claudis PENNER, MD;  [facility];  Service: Endoscopy;  Laterality: N/A;   ESOPHAGOGASTRODUODENOSCOPY N/A 04/29/2017   Procedure: ESOPHAGOGASTRODUODENOSCOPY (EGD);  Surgeon: Golda Claudis PENNER, MD;  [facility];  Service: Endoscopy;  Laterality: N/A;  1015   ESOPHAGOGASTRODUODENOSCOPY (EGD) WITH ESOPHAGEAL DILATION N/A 02/14/2013   Procedure: ESOPHAGOGASTRODUODENOSCOPY (EGD) WITH ESOPHAGEAL DILATION;  Surgeon: Claudis PENNER Golda, MD;  [facility];  Service: Endoscopy;  Laterality: N/A;  200-moved to 255 Ann to notify pt   ESOPHAGOGASTRODUODENOSCOPY (EGD) WITH PROPOFOL  N/A 05/28/2022   Procedure: ESOPHAGOGASTRODUODENOSCOPY (EGD) WITH PROPOFOL ;  Surgeon: Eartha Angelia Sieving, MD;  [facility];  Service: Gastroenterology;  Laterality: N/A;  1030 ASA 2   ESOPHAGOGASTRODUODENOSCOPY (EGD) WITH PROPOFOL  N/A 07/02/2022   Procedure: ESOPHAGOGASTRODUODENOSCOPY (EGD) WITH PROPOFOL ;  Surgeon: Eartha Angelia Sieving, MD;  [facility];  Service: Gastroenterology;  Laterality: N/A;  105 ASA 2, pt knows new time per Anette Caldron  EYE SURGERY  10-2010   left eye  removal of cataract   LEFT HEART CATH AND CORONARY ANGIOGRAPHY N/A 12/10/2022   Procedure: LEFT HEART CATH AND CORONARY ANGIOGRAPHY;  Surgeon: Wonda Sharper, MD;  Location: [facility] INVASIVE CV LAB;  Service: Cardiovascular;  Laterality: N/A;   MASS BIOPSY N/A 02/29/2024   Procedure: BIOPSY, THYROID ;  Surgeon: Karis Clunes, MD;  Location: [facility] OR;  Service: ENT;  Laterality: N/A;   TRACHEOSTOMY TUBE PLACEMENT N/A 02/29/2024   Procedure: CREATION, TRACHEOSTOMY;  Surgeon: Karis Clunes, MD;  Location: [facility] OR;  Service: ENT;  Laterality: N/A;  WITH THYROID  BIOPSY   TUBAL LIGATION  1984    FAMILY HISTORY: family history includes Cancer in her father and mother; Diabetes in her father and mother.  SOCIAL HISTORY:  reports that she has never smoked. She has never used smokeless tobacco. She reports that she does not drink alcohol and does not use drugs.  ALLERGIES: Ampicillin, Bromday [bromfenac sodium], Lisinopril, and Tetanus toxoids  MEDICATIONS:  No current facility-administered medications for this encounter.   No current outpatient medications on file.   Facility-Administered Medications Ordered in Other Encounters  Medication Dose Route Frequency Provider Last Rate Last Admin   acetaminophen  (TYLENOL ) tablet 650 mg  650 mg Oral Q6H PRN Perri DELENA Meliton Mickey., MD       Or   acetaminophen  (TYLENOL ) suppository 650 mg  650 mg Rectal Q6H PRN Perri DELENA Meliton Mickey., MD       aspirin  chewable tablet 81 mg  81 mg Oral Daily Perri DELENA Meliton Mickey., MD   81 mg at 03/06/24 1326   clonazePAM  (KLONOPIN ) tablet 0.5 mg  0.5 mg Oral BID PRN Perri DELENA Meliton Mickey., MD   0.5 mg at 03/06/24 1444   clopidogrel  (PLAVIX ) tablet 75 mg  75 mg Oral Daily Perri DELENA Meliton Mickey., MD   75 mg at 03/06/24 1326   diltiazem  (CARDIZEM ) tablet 30 mg  30 mg Oral BID Perri DELENA Meliton Mickey., MD   30 mg at 03/06/24 1326   famotidine  (PEPCID ) tablet 40 mg  40 mg Oral Daily Perri DELENA Meliton Mickey., MD   40 mg at 03/06/24 1326   feeding  supplement (OSMOLITE 1.5 CAL) liquid 1,000 mL  1,000 mL Per Tube Continuous Elgergawy, Dawood S, MD 45 mL/hr at 03/05/24 2251 1,000 mL at 03/05/24 2251   feeding supplement (PROSource TF20) liquid 60 mL  60 mL Per Tube Daily Elgergawy, Brayton RAMAN, MD   60 mL at 03/06/24 1327   free water  100 mL  100 mL Per Tube Q4H Elgergawy, Dawood S, MD   100 mL at 03/06/24 1333   heparin  injection 5,000 Units  5,000 Units Subcutaneous Q8H Elgergawy, Dawood S, MD   5,000 Units at 03/06/24 1538   insulin  aspart (novoLOG ) injection 0-20 Units  0-20 Units Subcutaneous Q4H Perri DELENA Meliton Mickey., MD   11 Units at 03/06/24 1323   insulin  aspart (novoLOG ) injection 6 Units  6 Units Subcutaneous Q4H Perri DELENA Meliton Mickey., MD   6 Units at 03/06/24 1323   insulin  glargine-yfgn (SEMGLEE ) injection 30 Units  30 Units Subcutaneous QHS Perri DELENA Meliton Mickey., MD   30 Units at 03/05/24 2340   latanoprost  (XALATAN ) 0.005 % ophthalmic solution 1 drop  1 drop Both Eyes QHS Elgergawy, Brayton RAMAN, MD   1 drop at 03/05/24 2342   levofloxacin  (LEVAQUIN ) tablet 750 mg  750 mg Oral Q48H Weingarten, Rachael I, RPH   750 mg at 03/06/24  1538   metroNIDAZOLE  (FLAGYL ) tablet 500 mg  500 mg Oral Q12H Perri DELENA Meliton Mickey., MD   500 mg at 03/06/24 1326   multivitamin with minerals tablet 1 tablet  1 tablet Oral Daily Perri DELENA Meliton Mickey., MD   1 tablet at 03/06/24 1326   ondansetron  (ZOFRAN ) tablet 4 mg  4 mg Oral Q6H PRN Perri DELENA Meliton Mickey., MD       Or   ondansetron  (ZOFRAN ) injection 4 mg  4 mg Intravenous Q6H PRN Perri DELENA Meliton Mickey., MD       Oral care mouth rinse  15 mL Mouth Rinse 4 times per day Perri DELENA Meliton Mickey., MD   15 mL at 03/06/24 1333   Oral care mouth rinse  15 mL Mouth Rinse PRN Perri DELENA Meliton Mickey., MD       phenol [facility]) mouth spray 1 spray  1 spray Mouth/Throat PRN Perri DELENA Meliton Mickey., MD   1 spray at 03/03/24 2140   polyethylene glycol (MIRALAX  / GLYCOLAX ) packet 17 g  17 g Oral Daily Perri DELENA Meliton Mickey., MD   17 g at 03/06/24 1327   rosuvastatin  (CRESTOR ) tablet 40 mg  40 mg Oral Daily Perri DELENA Meliton Mickey., MD   40 mg at 03/06/24 1326   sodium chloride  (OCEAN) 0.65 % nasal spray 1 spray  1 spray Each Nare PRN Perri DELENA Meliton Mickey., MD   1 spray at 03/02/24 1802   timolol  (TIMOPTIC ) 0.5 % ophthalmic solution 1 drop  1 drop Both Eyes BID Dobbs, Andrea K, RPH   1 drop at 03/06/24 1000   traZODone  (DESYREL ) tablet 50 mg  50 mg Oral QHS Perri DELENA Meliton Mickey., MD   50 mg at 03/05/24 2236    REVIEW OF SYSTEMS:  Notable for that above.   PHYSICAL EXAM:  vitals were not taken for this visit.   General: Alert and oriented, in no acute distress / lying on gurney / noisy breathing through trach HEENT: Head is normocephalic. Extraocular movements are intact.  ? Slight proptosis of right eye.  Trach in place, NG tube in place Neck: palpable right thyroid  mass, no skin invasion Extremities: cannot move left leg or arm, but has some movement of left hand/fingers Skin: No concerning lesions. Musculoskeletal: cannot move left leg or arm, but has some movement of left hand/fingers Neurologic: Speech is hoarse but fluent; cannot move left leg or arm, but has some movement of left hand/fingers; EOMI Psychiatric: Judgment and insight are intact. Affect is appropriate.  ECOG = 4  0 - Asymptomatic (Fully active, able to carry on all predisease activities without restriction)  1 - Symptomatic but completely ambulatory (Restricted in physically strenuous activity but ambulatory and able to carry out work of a light or sedentary nature. For example, light housework, office work)  2 - Symptomatic, <50% in bed during the day (Ambulatory and capable of all self care but unable to carry out any work activities. Up and about more than 50% of waking hours)  3 - Symptomatic, >50% in bed, but not bedbound (Capable of only limited self-care, confined to bed or chair 50% or more of waking hours)  4 -  Bedbound (Completely disabled. Cannot carry on any self-care. Totally confined to bed or chair)  5 - Death   Raylene MM, Creech RH, Tormey DC, et al. [phone]). Toxicity and response criteria of the [facility]. Am. DOROTHA Bridges. Oncol. 5 (6): 649-55   LABORATORY  DATA:  Lab Results  Component Value Date   WBC 16.1 (H) 03/06/2024   HGB 9.6 (L) 03/06/2024   HCT 30.7 (L) 03/06/2024   MCV 88.5 03/06/2024   PLT 394 03/06/2024   CMP     Component Value Date/Time   NA 135 03/06/2024 0614   NA 143 03/29/2023 1140   K 4.2 03/06/2024 0614   CL 99 03/06/2024 0614   CO2 25 03/06/2024 0614   GLUCOSE 250 (H) 03/06/2024 0614   BUN 45 (H) 03/06/2024 0614   BUN 19 03/29/2023 1140   CREATININE 0.94 03/06/2024 0614   CREATININE 0.82 07/20/2019 1025   CALCIUM  9.3 03/06/2024 0614   PROT 6.0 (L) 03/06/2024 0614   PROT 6.9 03/29/2023 1140   ALBUMIN 2.5 (L) 03/06/2024 0614   ALBUMIN 4.1 03/29/2023 1140   AST 46 (H) 03/06/2024 0614   ALT 55 (H) 03/06/2024 0614   ALKPHOS 79 03/06/2024 0614   BILITOT 0.2 03/06/2024 0614   BILITOT 0.5 03/29/2023 1140   GFR 66.28 10/20/2020 1607   EGFR 83.0 04/27/2023 0801   EGFR 69 03/29/2023 1140   GFRNONAA >60 03/06/2024 0614   GFRNONAA 71 07/20/2019 1025         RADIOGRAPHY: DG CHEST PORT 1 VIEW Result Date: 03/04/2024 CLINICAL DATA:  [id number] Fever [id number] EXAM: PORTABLE CHEST 1 VIEW COMPARISON:  February 28, 2024 FINDINGS: The cardiomediastinal silhouette is unchanged in contour.Enteric tube tip projects over the expected area of the proximal duodenum. Tracheostomy. Atherosclerotic calcifications. Enteric contrast delineates a loop of bowel in the LEFT upper quadrant, likely colon. No pleural effusion. No pneumothorax. No acute pleuroparenchymal abnormality. IMPRESSION: No acute cardiopulmonary abnormality. Electronically Signed   By: Corean Salter M.D.   On: 03/04/2024 09:08   DG Swallowing Func-Speech Pathology Result Date: 03/03/2024 Table  formatting from the original result was not included. Modified Barium Swallow Study Patient Details Name: Nancy Sutton MRN: [id number] Date of Birth: 04/01/1946 Today's Date: 03/03/2024 HPI/PMH: HPI: 78 yo female presenting 6/7 with rapidly enlarging thyroid  mass. CT Neck showed 4.1 x 2.5 x 3.9 cm heterogeneous mass in the right anterior neck at  the level of the thyroid  concerning for residual/recurrent thyroid  malignancy. Associated mass effect and leftward shift of the airway without significant airway narrowing. 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 Associated retropharyngeal edema. Trach done by ENT 6/11. Code stroke was called 6/13 due to L sided weakness although time of onset likely the day prior. BS completed on 6/12 recommending Dys 2, nectar thick liquids but patient the next day, was having difficulty swallowing and had new onset stroke symptoms. MRI showed Patchy acute ischemic nonhemorrhagic right frontal, parietal, and occipital infarcts, watershed in distribution. Patient made NPO awaiting reevaluation of swallowing with repeat MBS recommended. Large bore NG changed to small bore. PMH includes: GERD, stroke, Barrett's esophagus with esophageal dysphagia s/p dilation, HLD, NSTEMI, PAF, CAD, HTN Clinical Impression: Clinical Impression: As compared to MBS completed on 03/01/24, MBS completed today appeared only mildly different. She continues with limited anterior hyoid movement, reduced laryngeal elevation and incomplete laryngeal vestibule closure. This leads to penetration of thin liquids and nectar thick liquids to level of vocal cords before spontaneously clearing from laryngeal vestibule (PAS 4) as well as an instance each of thin and nectar thick liquid material passing below the vocal cords before spontaneously clearing from laryngeal vestibule (PAS 6). Silent aspiration was observed with  thin liquids. (  PAS 8) The main difference in today's MBS as compared to MBS on 6/12 was increased delay in transit of puree solid and honey and nectar thick liquid boluses as they transited through pharynx and through PES. Patient was noticeably tired at beginning of study and did appear to become more fatigued as study progressed, at one point leaning her head against fluoroscopy machine. SLP is recommending continue NPO but allow to continue PRN ice chips as well as floor stock purees/puddings and floor stock nectar thick liquids. SLP will follow for full PO readiness. Factors that may increase risk of adverse event in presence of aspiration Noe & Lianne 2021): Factors that may increase risk of adverse event in presence of aspiration Noe & Lianne 2021): Weak cough; Presence of tubes (ETT, trach, NG, etc.); Frail or deconditioned Recommendations/Plan: Swallowing Evaluation Recommendations Swallowing Evaluation Recommendations Recommendations: NPO; Ice chips PRN after oral care Medication Administration: Crushed with puree Supervision: Patient able to self-feed; Staff to assist with self-feeding; Full supervision/cueing for swallowing strategies Swallowing strategies  : Place PMSV during PO intake; Slow rate; Small bites/sips Postural changes: Position pt fully upright for meals Oral care recommendations: Oral care BID (2x/day) Treatment Plan Treatment Plan Treatment recommendations: Therapy as outlined in treatment plan below Follow-up recommendations: Outpatient SLP Functional status assessment: Patient has had a recent decline in their functional status and demonstrates the ability to make significant improvements in function in a reasonable and predictable amount of time. Treatment frequency: Min 2x/week Treatment duration: 2 weeks Interventions: Aspiration precaution training; Compensatory techniques; Patient/family education; Trials of upgraded texture/liquids; Diet toleration management by SLP;  Respiratory muscle strength training Recommendations Recommendations for follow up therapy are one component of a multi-disciplinary discharge planning process, led by the attending physician.  Recommendations may be updated based on patient status, additional functional criteria and insurance authorization. Assessment: Orofacial Exam: Orofacial Exam Oral Cavity: Oral Hygiene: WFL Oral Cavity - Dentition: Adequate natural dentition Orofacial Anatomy: WFL Oral Motor/Sensory Function: WFL Anatomy: Anatomy: Suspected cervical osteophytes Boluses Administered: Boluses Administered Boluses Administered: Thin liquids (Level 0); Mildly thick liquids (Level 2, nectar thick); Moderately thick liquids (Level 3, honey thick); Puree  Oral Impairment Domain: Oral Impairment Domain Lip Closure: No labial escape Tongue control during bolus hold: Cohesive bolus between tongue to palatal seal Bolus preparation/mastication: Timely and efficient chewing and mashing Bolus transport/lingual motion: Brisk tongue motion Oral residue: Complete oral clearance Location of oral residue : N/A Initiation of pharyngeal swallow : Valleculae  Pharyngeal Impairment Domain: Pharyngeal Impairment Domain Soft palate elevation: No bolus between soft palate (SP)/pharyngeal wall (PW) Laryngeal elevation: Partial superior movement of thyroid  cartilage/partial approximation of arytenoids to epiglottic petiole Anterior hyoid excursion: Partial anterior movement Epiglottic movement: No inversion Laryngeal vestibule closure: Incomplete, narrow column air/contrast in laryngeal vestibule Pharyngeal stripping wave : Present - complete Pharyngeal contraction (A/P view only): N/A Pharyngoesophageal segment opening: Complete distension and complete duration, no obstruction of flow Tongue base retraction: Trace column of contrast or air between tongue base and PPW Pharyngeal residue: Collection of residue within or on pharyngeal structures Location of pharyngeal  residue: Pyriform sinuses; Valleculae  Esophageal Impairment Domain: Esophageal Impairment Domain Esophageal clearance upright position: Complete clearance, esophageal coating Pill: No data recorded Penetration/Aspiration Scale Score: Penetration/Aspiration Scale Score 1.  Material does not enter airway: Puree 2.  Material enters airway, remains ABOVE vocal cords then ejected out: Moderately thick liquids (Level 3, honey thick) 3.  Material enters airway, remains ABOVE vocal cords and not ejected out: Mildly thick  liquids (Level 2, nectar thick) 4.  Material enters airway, CONTACTS cords then ejected out: Thin liquids (Level 0); Mildly thick liquids (Level 2, nectar thick) 6.  Material enters airway, passes BELOW cords then ejected out: Thin liquids (Level 0); Mildly thick liquids (Level 2, nectar thick) 8.  Material enters airway, passes BELOW cords without attempt by patient to eject out (silent aspiration) : Thin liquids (Level 0) Compensatory Strategies: Compensatory Strategies Compensatory strategies: Yes Right head turn: Ineffective Ineffective Right Head Turn: Thin liquid (Level 0); Mildly thick liquid (Level 2, nectar thick)   General Information: Caregiver present: Yes  Diet Prior to this Study: NPO; Cortrak/Small bore NG tube   No data recorded  Respiratory Status: WFL   No data recorded  History of Recent Intubation: Yes  Behavior/Cognition: Alert; Cooperative; Pleasant mood; Lethargic/Drowsy Self-Feeding Abilities: Able to self-feed Baseline vocal quality/speech: Dysphonic Volitional Cough: Able to elicit Volitional Swallow: Able to elicit Exam Limitations: No limitations Goal Planning: Prognosis for improved oropharyngeal function: Good Barriers to Reach Goals: Overall medical prognosis No data recorded Patient/Family Stated Goal: patient requesting ice chips Consulted and agree with results and recommendations: Patient; Family member/caregiver; Nurse Pain: Pain Assessment Pain Assessment: Faces Faces  Pain Scale: 2 Pain Location: burning in throat from coughing Pain Descriptors / Indicators: Burning Pain Intervention(s): Monitored during session End of Session: Start Time:SLP Start Time (ACUTE ONLY): 1255 Stop Time: SLP Stop Time (ACUTE ONLY): 1315 Time Calculation:SLP Time Calculation (min) (ACUTE ONLY): 20 min Charges: SLP Evaluations $ SLP Speech Visit: 1 Visit SLP Evaluations $MBS Swallow: 1 Procedure $ SLP EVAL LANGUAGE/SOUND PRODUCTION: 1 Procedure $Swallowing Treatment: 1 Procedure SLP visit diagnosis: SLP Visit Diagnosis: Dysphagia, oropharyngeal phase (R13.12) Past Medical History: Past Medical History: Diagnosis Date  Arthritis   CAD (coronary artery disease) 03/28/2023  NSTEMI 11/2022 s/p 3 x 16 mm DES to the [facility], 3 x 20 mm DES to the mRCA   - LHC 12/13/2022: RCA ostial 95, mid 90; LCx mid 70; LAD mid 40, distal 70>> Med Rx for LAD, LCx   - TTE 12/09/2022: EF 50-55, no RWMA, mild concentric LVH, GR 2 DD, normal RVSF, mild MR, RAP 15  Diabetes mellitus   since 2008  Essential hypertension, benign 09/10/2015  GERD (gastroesophageal reflux disease)   Hypercholesteremia   Hyperlipidemia   Stroke (HCC) 03/21/2011  only deficits-affected eyesight Past Surgical History: Past Surgical History: Procedure Laterality Date  BIOPSY  07/02/2022  Procedure: BIOPSY;  Surgeon: Eartha Angelia Sieving, MD;  [facility];  Service: Gastroenterology;;  CATARACT EXTRACTION W/PHACO  11/01/2011  Procedure: CATARACT EXTRACTION PHACO AND INTRAOCULAR LENS PLACEMENT (IOC);  Surgeon: Cherene Mania, MD;  Location: [facility];  Service: Ophthalmology;  Laterality: Right;  CDE 12.48  CORONARY STENT INTERVENTION N/A 12/10/2022  Procedure: CORONARY STENT INTERVENTION;  Surgeon: Wonda Sharper, MD;  Location: [facility] INVASIVE CV LAB;  Service: Cardiovascular;  Laterality: N/A;  ESOPHAGEAL DILATION N/A 04/29/2017  Procedure: ESOPHAGEAL DILATION;  Surgeon: Golda Claudis PENNER, MD;  [facility];  Service: Endoscopy;  Laterality:  N/A;  ESOPHAGOGASTRODUODENOSCOPY N/A 04/29/2017  Procedure: ESOPHAGOGASTRODUODENOSCOPY (EGD);  Surgeon: Golda Claudis PENNER, MD;  [facility];  Service: Endoscopy;  Laterality: N/A;  1015  ESOPHAGOGASTRODUODENOSCOPY (EGD) WITH ESOPHAGEAL DILATION N/A 02/14/2013  Procedure: ESOPHAGOGASTRODUODENOSCOPY (EGD) WITH ESOPHAGEAL DILATION;  Surgeon: Claudis PENNER Golda, MD;  [facility];  Service: Endoscopy;  Laterality: N/A;  200-moved to 255 Ann to notify pt  ESOPHAGOGASTRODUODENOSCOPY (EGD) WITH PROPOFOL  N/A 05/28/2022  Procedure: ESOPHAGOGASTRODUODENOSCOPY (  EGD) WITH PROPOFOL ;  Surgeon: Eartha Angelia Sieving, MD;  [facility];  Service: Gastroenterology;  Laterality: N/A;  1030 ASA 2  ESOPHAGOGASTRODUODENOSCOPY (EGD) WITH PROPOFOL  N/A 07/02/2022  Procedure: ESOPHAGOGASTRODUODENOSCOPY (EGD) WITH PROPOFOL ;  Surgeon: Eartha Angelia Sieving, MD;  [facility];  Service: Gastroenterology;  Laterality: N/A;  105 ASA 2, pt knows new time per Anette Caldron  EYE SURGERY  10-2010  left eye removal of cataract  LEFT HEART CATH AND CORONARY ANGIOGRAPHY N/A 12/10/2022  Procedure: LEFT HEART CATH AND CORONARY ANGIOGRAPHY;  Surgeon: Wonda Sharper, MD;  Location: [facility] INVASIVE CV LAB;  Service: Cardiovascular;  Laterality: N/A;  MASS BIOPSY N/A 02/29/2024  Procedure: BIOPSY, THYROID ;  Surgeon: Karis Clunes, MD;  Location: [facility] OR;  Service: ENT;  Laterality: N/A;  TRACHEOSTOMY TUBE PLACEMENT N/A 02/29/2024  Procedure: CREATION, TRACHEOSTOMY;  Surgeon: Karis Clunes, MD;  Location: [facility] OR;  Service: ENT;  Laterality: N/A;  WITH THYROID  BIOPSY  TUBAL LIGATION  1984 Norleen IVAR Blase, MA, CCC-SLP Speech Therapy   MR BRAIN WO CONTRAST Result Date: 03/02/2024 CLINICAL DATA:  Follow-up examination for acute or deficit, stroke. EXAM: MRI HEAD WITHOUT CONTRAST TECHNIQUE: Multiplanar, multiecho pulse sequences of the brain and surrounding structures were obtained without intravenous contrast. COMPARISON:  Comparison made  with CTs from earlier the same day. FINDINGS: Brain: Cerebral volume within normal limits. Encephalomalacia and gliosis involving the right occipital lobe, consistent with a chronic right PCA distribution infarct. Patchy restricted diffusion involving the cortical to subcortical aspect of the right frontal, parietal, and occipital lobes, consistent with acute ischemic nonhemorrhagic infarcts. These are watershed in distribution. No associated hemorrhage or significant mass effect. No other evidence for acute or subacute ischemia. No acute or chronic intracranial blood products. No mass lesion, midline shift or mass effect. Ex vacuo dilatation of the right lateral ventricle related to the chronic right PCA territory infarct without hydrocephalus. No extra-axial fluid collection. Pituitary gland within normal limits. Vascular: Loss of normal flow void within the cavernous right ICA related to the previously identified focal occlusion versus high-grade stenosis (series 5, image 8). Major intracranial vascular flow voids are otherwise maintained. Skull and upper cervical spine: Cranial junction within normal limits. Bone marrow signal intensity normal. No scalp soft tissue abnormality. Sinuses/Orbits: Prior bilateral ocular lens replacement. Paranasal sinuses are largely clear. No mastoid effusion. Other: None. IMPRESSION: 1. Patchy acute ischemic nonhemorrhagic right frontal, parietal, and occipital infarcts, watershed in distribution. 2. Chronic right PCA distribution infarct. 3. Loss of normal flow void within the cavernous right ICA related to the previously identified focal occlusion versus high-grade stenosis. Electronically Signed   By: Morene Hoard M.D.   On: 03/02/2024 19:08   ECHOCARDIOGRAM COMPLETE Result Date: 03/02/2024    ECHOCARDIOGRAM REPORT   Patient Name:   Nancy Sutton Date of Exam: 03/02/2024 Medical Rec #:  [id number]      Height:       60.0 in Accession #:    [id number]     Weight:        122.6 lb Date of Birth:  [birth year]      BSA:          1.516 m Patient Age:    78 years       BP:           175/60 mmHg Patient Gender: F              HR:           81  bpm. Exam Location:  Inpatient Procedure: 2D Echo, Color Doppler and Cardiac Doppler (Both Spectral and Color            Flow Doppler were utilized during procedure). Indications:    Stroke  History:        Patient has prior history of Echocardiogram examinations, most                 recent 12/09/2022. NSTEMI.  Sonographer:    Benard Stallion Referring Phys: [id number] ARSHAD N KAKRAKANDY IMPRESSIONS  1. Left ventricular ejection fraction, by estimation, is 55 to 60%. The left ventricle has normal function. The left ventricle has no regional wall motion abnormalities. Left ventricular diastolic parameters were normal.  2. Right ventricular systolic function is normal. The right ventricular size is normal. Tricuspid regurgitation signal is inadequate for assessing PA pressure.  3. The mitral valve is normal in structure. Trivial mitral valve regurgitation.  4. The aortic valve was not well visualized. Aortic valve regurgitation is not visualized. No aortic stenosis is present.  5. The inferior vena cava is dilated in size with >50% respiratory variability, suggesting right atrial pressure of 8 mmHg. FINDINGS  Left Ventricle: Left ventricular ejection fraction, by estimation, is 55 to 60%. The left ventricle has normal function. The left ventricle has no regional wall motion abnormalities. The left ventricular internal cavity size was normal in size. There is  no left ventricular hypertrophy. Left ventricular diastolic parameters were normal. Right Ventricle: The right ventricular size is normal. No increase in right ventricular wall thickness. Right ventricular systolic function is normal. Tricuspid regurgitation signal is inadequate for assessing PA pressure. Left Atrium: Left atrial size was normal in size. Right Atrium: Right atrial size was normal in  size. Pericardium: There is no evidence of pericardial effusion. Mitral Valve: The mitral valve is normal in structure. Trivial mitral valve regurgitation. Tricuspid Valve: The tricuspid valve is normal in structure. Tricuspid valve regurgitation is trivial. Aortic Valve: The aortic valve was not well visualized. Aortic valve regurgitation is not visualized. No aortic stenosis is present. Aortic valve mean gradient measures 4.0 mmHg. Aortic valve peak gradient measures 7.3 mmHg. Aortic valve area, by VTI measures 2.00 cm. Pulmonic Valve: The pulmonic valve was not well visualized. Pulmonic valve regurgitation is not visualized. Aorta: The aortic root is normal in size and structure. Venous: The inferior vena cava is dilated in size with greater than 50% respiratory variability, suggesting right atrial pressure of 8 mmHg. IAS/Shunts: The interatrial septum was not well visualized.  LEFT VENTRICLE PLAX 2D LVIDd:         3.97 cm   Diastology LVIDs:         3.15 cm   LV e' medial:    9.57 cm/s LV PW:         0.83 cm   LV E/e' medial:  10.6 LV IVS:        0.83 cm   LV e' lateral:   13.30 cm/s LVOT diam:     1.70 cm   LV E/e' lateral: 7.6 LV SV:         54 LV SV Index:   36 LVOT Area:     2.27 cm  RIGHT VENTRICLE RV Basal diam:  3.10 cm RV Mid diam:    2.80 cm RV S prime:     11.70 cm/s TAPSE (M-mode): 2.1 cm LEFT ATRIUM             Index  RIGHT ATRIUM           Index LA diam:        3.20 cm 2.11 cm/m   RA Area:     12.00 cm LA Vol (A2C):   43.5 ml 28.69 ml/m  RA Volume:   26.10 ml  17.22 ml/m LA Vol (A4C):   30.6 ml 20.19 ml/m LA Biplane Vol: 36.8 ml 24.28 ml/m  AORTIC VALVE AV Area (Vmax):    1.80 cm AV Area (Vmean):   1.87 cm AV Area (VTI):     2.00 cm AV Vmax:           135.00 cm/s AV Vmean:          89.800 cm/s AV VTI:            0.270 m AV Peak Grad:      7.3 mmHg AV Mean Grad:      4.0 mmHg LVOT Vmax:         107.00 cm/s LVOT Vmean:        73.900 cm/s LVOT VTI:          0.238 m LVOT/AV VTI ratio:  0.88  AORTA Ao Root diam: 2.40 cm MITRAL VALVE MV Area (PHT): 4.31 cm     SHUNTS MV Decel Time: 176 msec     Systemic VTI:  0.24 m MV E velocity: 101.00 cm/s  Systemic Diam: 1.70 cm MV A velocity: 128.00 cm/s MV E/A ratio:  0.79 Lonni Nanas MD Electronically signed by Lonni Nanas MD Signature Date/Time: 03/02/2024/11:11:31 AM    Final    CT ANGIO HEAD NECK W WO CM W PERF (CODE STROKE) Result Date: 03/02/2024 CLINICAL DATA:  Initial evaluation for acute neuro deficit. History of thyroid  cancer. EXAM: CT ANGIOGRAPHY HEAD AND NECK CT PERFUSION BRAIN TECHNIQUE: Multidetector CT imaging of the head and neck was performed using the standard protocol during bolus administration of intravenous contrast. Multiplanar CT image reconstructions and MIPs were obtained to evaluate the vascular anatomy. Carotid stenosis measurements (when applicable) are obtained utilizing NASCET criteria, using the distal internal carotid diameter as the denominator. Multiphase CT imaging of the brain was performed following IV bolus contrast injection. Subsequent parametric perfusion maps were calculated using RAPID software. RADIATION DOSE REDUCTION: This exam was performed according to the departmental dose-optimization program which includes automated exposure control, adjustment of the mA and/or kV according to patient size and/or use of iterative reconstruction technique. CONTRAST:  100mL OMNIPAQUE  IOHEXOL  350 MG/ML SOLN COMPARISON:  Comparison made with CT from earlier the same day as well as prior neck CT from 02/28/2024. FINDINGS: CTA NECK FINDINGS Aortic arch: Visualized aortic arch within normal limits for caliber with standard 3 vessel morphology. Moderate aortic atherosclerosis. No significant stenosis about the origin the great vessels. Right carotid system: Right common and internal carotid arteries are patent without dissection. Atheromatous change about the right carotid bulb without hemodynamically  significant greater than 50% stenosis. Left carotid system: Left common and internal carotid arteries are patent without dissection. No hemodynamically significant stenosis about the left carotid artery system. Vertebral arteries: Both vertebral arteries arise from subclavian arteries. Vertebral arteries patent without stenosis or dissection. Skeleton: No worrisome osseous lesions. Mild-to-moderate multilevel spondylosis noted within the visualized cervicothoracic spine. Other neck: Nasogastric tube in place. Heterogeneous enhancing ill-defined mass positioned at the right thyroid  fossa again seen, measuring approximately 3.9 x 2.8 x 3.2 cm. Multiple abnormal and partially necrotic nodes within the adjacent left neck, concerning for nodal metastatic disease.  Changes are relatively similar as compared to recent neck CT. Interval placement of a tracheostomy with tip well positioned in the upper airway. Small volume secretions noted within subglottic trachea distal to the tracheostomy tube. Upper chest: No other acute finding. Review of the MIP images confirms the above findings CTA HEAD FINDINGS Anterior circulation: Atheromatous change seen about the carotid siphons bilaterally. On the right, there is abrupt cut off of the cavernous right ICA at the level of the anterior genu (series 10, images 103-95). The right ICA is functionally occluded at this level. Distal reconstitution via collateral flow either via the ophthalmic artery or cross the circle-of-Willis. Upon review of prior MRA from 2012, there appears to have been a moderate to severe stenosis at this location. Therefore, finding is favored to reflect a severe high-grade progressive stenosis. Acute superimposed intraluminal thrombus is difficult to exclude. On the left, atheromatous change about the left siphon with resultant moderate to severe stenosis at the para clinoid region (series 10, image 92). A1 segments are diminutive but patent. Anterior  communicating artery complex within normal limits. Both ACAs patent to their distal aspects without stenosis. Right ACA dominant. M1 segments patent without stenosis. No proximal SCA branch occlusion. Distal MCA branches perfused and fairly symmetric. Posterior circulation: Both V4 segments patent without stenosis. Left vertebral artery dominant. Both PICA patent. Basilar patent without stenosis. Superior cerebral arteries patent bilaterally. Left PCA primarily supplied via the basilar. Atheromatous change about the left PCA with moderate proximal left P2 stenosis (series 18, image 21). The right PCA is diffusely irregular and attenuated, in keeping with the chronic right PCA territory infarct. Right PCA remains patent to its distal aspect. Venous sinuses: Patent allowing for timing the contrast bolus. Anatomic variants: As above. No visible aneurysm. Review of the MIP images confirms the above findings CT Brain Perfusion Findings: ASPECTS: 10 CBF (<30%) Volume: 0mL Perfusion (Tmax>6.0s) volume: 43mL Mismatch Volume: 43 mL Infarction Location:43 mL areas of delayed perfusion involving the right cerebral hemisphere, somewhat watershed distribution. Finding presumably related to functional occlusion/severe stenosis at the cavernous right ICA as detailed above. No acute core infarct. No other perfusion abnormality IMPRESSION: 1. 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 Distal reconstitution via collateral flow either via the ophthalmic artery or cross the circle-of-Willis. Upon review of prior MRA from 2012, there appears to have been a moderate to severe stenosis at this location. Therefore, this finding is favored to reflect a severe high-grade progressive stenosis although acutely superimposed intraluminal thrombus is difficult to exclude. 2. 43 mL areas of delayed perfusion involving the right cerebral hemisphere, somewhat watershed and distribution.  Finding is in keeping with the above described high-grade stenosis/occlusion. No acute core infarct by CT perfusion. 3. 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 4. Diffusely irregular and attenuated right PCA, in keeping with the chronic right PCA territory infarct. 5. Heterogeneous enhancing mass at the right thyroid  fossa, presumably reflecting patient's known thyroid  carcinoma. Scattered partially necrotic lymph nodes within the adjacent right neck, concerning for nodal metastatic disease. Changes are relatively similar as compared to recent neck CT from 02/28/2024. 6. Interval placement of a tracheostomy tube with tip well positioned in the upper airway. 7.  Aortic Atherosclerosis (ICD10-I70.0). Results discussed by telephone at the time of interpretation on 03/02/2024 at 1:35 a.m. to provider ASHISH ARORA. Electronically Signed   By: Morene  Nicholes M.D.   On: 03/02/2024 02:05   CT HEAD CODE STROKE WO CONTRAST Result Date: 03/02/2024 CLINICAL DATA:  Code stroke. Initial evaluation for acute neuro deficit, stroke suspected. EXAM: CT HEAD WITHOUT CONTRAST TECHNIQUE: Contiguous axial images were obtained from the base of the skull through the vertex without intravenous contrast. RADIATION DOSE REDUCTION: This exam was performed according to the departmental dose-optimization program which includes automated exposure control, adjustment of the mA and/or kV according to patient size and/or use of iterative reconstruction technique. COMPARISON:  None Available. FINDINGS: Brain: Cerebral volume within normal limits. Encephalomalacia involving the right occipital lobe, consistent with a chronic right PCA distribution infarct. No acute intracranial hemorrhage. No acute large vessel territory infarct. No mass lesion or midline shift. No hydrocephalus or extra-axial fluid collection. Vascular: No abnormal hyperdense vessel. Calcified  atherosclerosis present at the skull base. Skull: Scalp soft tissues within normal limits.  Calvarium intact. Sinuses/Orbits: Globes orbital soft tissues demonstrate no acute finding. Paranasal sinuses are largely clear. No significant mastoid effusion. Nasogastric tube in place. Other: None. ASPECTS [facility] Stroke Program Early CT Score) - Ganglionic level infarction (caudate, lentiform nuclei, internal capsule, insula, M1-M3 cortex): 7 - Supraganglionic infarction (M4-M6 cortex): 3 Total score (0-10 with 10 being normal): 10 IMPRESSION: 1. No acute intracranial abnormality. 2. ASPECTS is 10. 3. Chronic right PCA territory infarct. These results were communicated to Dr. Arora at 1:00 am on 03/02/2024 by text page via the [facility] messaging system. Electronically Signed   By: Morene Nicholes M.D.   On: 03/02/2024 01:01   DG Swallowing Func-Speech Pathology Result Date: 03/01/2024 Table formatting from the original result was not included. Modified Barium Swallow Study Patient Details Name: Nancy Sutton MRN: [id number] Date of Birth: [birth year] Today's Date: 03/01/2024 HPI/PMH: HPI: 78 yo female presenting 6/7 with rapidly enlarging thyroid  mass. CT Neck showed 4.1 x 2.5 x 3.9 cm heterogeneous mass in the right anterior neck at  the level of the thyroid  concerning for residual/recurrent thyroid  malignancy. Associated mass effect and leftward shift of the airway without significant airway narrowing. 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 Associated retropharyngeal edema. Trach done by ENT 6/11. PMH includes: GERD, stroke, Barrett's esophagus with esophageal dysphagia s/p dilation, HLD, NSTEMI, PAF, CAD, HTN Clinical Impression: Clinical Impression: Pt's oral phase is functional but she has pharyngeal dysphagia, largely characterized by inability to fully close off her airway. She has limited anterior hyoid  movement, reduced laryngeal elevation, and with lighter boluses there is no epiglottic inversion (the weight of solid boluses help invert it a little bit, but it is still incomplete). Her laryngeal vestibule closure is incomplete, and liquids enter it during the swallow (or after from small amounts of residue, mostly caught in the valleculae). Aspiration occurs with thin liquids and is inconsistently sensed (PAS 7-8, usually a throat clear as opposed to a reflexive cough). A cued cough does not eject aspirates. A few postures and strategies were attempted but aspiration still occurred with a chin tuck. She did not aspirate when turning her head to the R or when using a supraglottic swallow, but she does still penetrate and she cannot clear penetrates. Pt has penetration that is more shallow with nectar (PAS 3) and honey thick liquids (PAS 2). Even though there is a little frank penetration with nectar thick liquids, almost all of it completely clears. Considering efficiency and safety with swallowing, recommend starting with Dys 2 diet  and nectar thick liquids with PMV in place for all PO intake. SLP will f/u for potential implementation of water  protocol with use of strategies. Factors that may increase risk of adverse event in presence of aspiration Noe & Lianne 2021): Factors that may increase risk of adverse event in presence of aspiration Noe & Lianne 2021): Weak cough; Presence of tubes (ETT, trach, NG, etc.) Recommendations/Plan: Swallowing Evaluation Recommendations Swallowing Evaluation Recommendations Recommendations: PO diet PO Diet Recommendation: Dysphagia 2 (Finely chopped); Mildly thick liquids (Level 2, nectar thick) Liquid Administration via: Cup; Straw Medication Administration: Crushed with puree Supervision: Patient able to self-feed; Intermittent supervision/cueing for swallowing strategies Swallowing strategies  : Place PMSV during PO intake; Slow rate; Small bites/sips Postural  changes: Position pt fully upright for meals Oral care recommendations: Oral care BID (2x/day) Caregiver Recommendations: Avoid jello, ice cream, thin soups, popsicles; Remove water  pitcher; Have oral suction available Treatment Plan Treatment Plan Follow-up recommendations: Outpatient SLP Functional status assessment: Patient has had a recent decline in their functional status and demonstrates the ability to make significant improvements in function in a reasonable and predictable amount of time. Treatment frequency: Min 2x/week Treatment duration: 2 weeks Interventions: Aspiration precaution training; Compensatory techniques; Patient/family education; Trials of upgraded texture/liquids; Diet toleration management by SLP; Respiratory muscle strength training Recommendations Recommendations for follow up therapy are one component of a multi-disciplinary discharge planning process, led by the attending physician.  Recommendations may be updated based on patient status, additional functional criteria and insurance authorization. Assessment: Orofacial Exam: Orofacial Exam Oral Cavity: Oral Hygiene: WFL Oral Cavity - Dentition: Adequate natural dentition Orofacial Anatomy: WFL Oral Motor/Sensory Function: WFL Anatomy: Anatomy: Suspected cervical osteophytes Boluses Administered: Boluses Administered Boluses Administered: Thin liquids (Level 0); Mildly thick liquids (Level 2, nectar thick); Moderately thick liquids (Level 3, honey thick); Puree; Solid  Oral Impairment Domain: Oral Impairment Domain Lip Closure: No labial escape Tongue control during bolus hold: Cohesive bolus between tongue to palatal seal Bolus preparation/mastication: Timely and efficient chewing and mashing Bolus transport/lingual motion: Brisk tongue motion Oral residue: Complete oral clearance Location of oral residue : N/A Initiation of pharyngeal swallow : Valleculae  Pharyngeal Impairment Domain: Pharyngeal Impairment Domain Soft palate  elevation: No bolus between soft palate (SP)/pharyngeal wall (PW) Laryngeal elevation: Partial superior movement of thyroid  cartilage/partial approximation of arytenoids to epiglottic petiole Anterior hyoid excursion: Partial anterior movement Epiglottic movement: No inversion Laryngeal vestibule closure: Incomplete, narrow column air/contrast in laryngeal vestibule Pharyngeal stripping wave : Present - complete Pharyngeal contraction (A/P view only): N/A Pharyngoesophageal segment opening: Complete distension and complete duration, no obstruction of flow Tongue base retraction: Trace column of contrast or air between tongue base and PPW Pharyngeal residue: Collection of residue within or on pharyngeal structures Location of pharyngeal residue: Pyriform sinuses; Valleculae  Esophageal Impairment Domain: Esophageal Impairment Domain Esophageal clearance upright position: Esophageal retention with retrograde flow through the PES (x1 with thin liquisd during coughing) Pill: No data recorded Penetration/Aspiration Scale Score: Penetration/Aspiration Scale Score 1.  Material does not enter airway: Puree; Solid 2.  Material enters airway, remains ABOVE vocal cords then ejected out: Moderately thick liquids (Level 3, honey thick) 3.  Material enters airway, remains ABOVE vocal cords and not ejected out: Mildly thick liquids (Level 2, nectar thick) 8.  Material enters airway, passes BELOW cords without attempt by patient to eject out (silent aspiration) : Thin liquids (Level 0) Compensatory Strategies: Compensatory Strategies Compensatory strategies: Yes Chin tuck: Ineffective Ineffective Chin Tuck: Thin liquid (Level 0) (trace  aspiration still occurred) Right head turn: Ineffective Ineffective Right Head Turn: Thin liquid (Level 0) (did not aspirate, but still penetrated) Supraglottic swallow: Ineffective Ineffective Supraglottic Swallow: Thin liquid (Level 0) (did not aspirate, but still penetrated)   General Information:  Caregiver present: Yes (son)  Diet Prior to this Study: NPO; Large bore NG tube   Temperature : Normal   Respiratory Status: WFL   Supplemental O2: Trach   History of Recent Intubation: Yes  Behavior/Cognition: Alert; Cooperative; Pleasant mood Self-Feeding Abilities: Able to self-feed Baseline vocal quality/speech: Normal Volitional Cough: Able to elicit Volitional Swallow: Able to elicit Exam Limitations: No limitations Goal Planning: Prognosis for improved oropharyngeal function: Good Barriers to Reach Goals: Overall medical prognosis (will need to see what her POC may be related to thyroid  mass) No data recorded Patient/Family Stated Goal: none stated Consulted and agree with results and recommendations: Patient; Family member/caregiver; Nurse Pain: Pain Assessment Pain Assessment: Faces Faces Pain Scale: 0 End of Session: Start Time:SLP Start Time (ACUTE ONLY): 1346 Stop Time: SLP Stop Time (ACUTE ONLY): 1406 Time Calculation:SLP Time Calculation (min) (ACUTE ONLY): 20 min Charges: SLP Evaluations $ SLP Speech Visit: 1 Visit SLP Evaluations $BSS Swallow: 1 Procedure $MBS Swallow: 1 Procedure $ SLP Eval Voice Prosthetic Device: Procedure $$ Passy Muir Speaking Valve: yes SLP visit diagnosis: SLP Visit Diagnosis: Dysphagia, pharyngeal phase (R13.13) Past Medical History: Past Medical History: Diagnosis Date  Arthritis   CAD (coronary artery disease) 03/28/2023  NSTEMI 11/2022 s/p 3 x 16 mm DES to the [facility], 3 x 20 mm DES to the mRCA   - LHC 12/13/2022: RCA ostial 95, mid 90; LCx mid 70; LAD mid 40, distal 70>> Med Rx for LAD, LCx   - TTE 12/09/2022: EF 50-55, no RWMA, mild concentric LVH, GR 2 DD, normal RVSF, mild MR, RAP 15  Diabetes mellitus   since 2008  Essential hypertension, benign 09/10/2015  GERD (gastroesophageal reflux disease)   Hypercholesteremia   Hyperlipidemia   Stroke (HCC) 03/21/2011  only deficits-affected eyesight Past Surgical History: Past Surgical History: Procedure Laterality Date  BIOPSY   07/02/2022  Procedure: BIOPSY;  Surgeon: Eartha Angelia Sieving, MD;  Location: [facility];  Service: Gastroenterology;;  CATARACT EXTRACTION W/PHACO  11/01/2011  Procedure: CATARACT EXTRACTION PHACO AND INTRAOCULAR LENS PLACEMENT (IOC);  Surgeon: Cherene Mania, MD;  Location: [facility] ORS;  Service: Ophthalmology;  Laterality: Right;  CDE 12.48  CORONARY STENT INTERVENTION N/A 12/10/2022  Procedure: CORONARY STENT INTERVENTION;  Surgeon: Wonda Sharper, MD;  Location: [facility] INVASIVE CV LAB;  Service: Cardiovascular;  Laterality: N/A;  ESOPHAGEAL DILATION N/A 04/29/2017  Procedure: ESOPHAGEAL DILATION;  Surgeon: Golda Claudis PENNER, MD;  Location: [facility];  Service: Endoscopy;  Laterality: N/A;  ESOPHAGOGASTRODUODENOSCOPY N/A 04/29/2017  Procedure: ESOPHAGOGASTRODUODENOSCOPY (EGD);  Surgeon: Golda Claudis PENNER, MD;  [facility]: [facility];  Service: Endoscopy;  Laterality: N/A;  1015  ESOPHAGOGASTRODUODENOSCOPY (EGD) WITH ESOPHAGEAL DILATION N/A 02/14/2013  Procedure: ESOPHAGOGASTRODUODENOSCOPY (EGD) WITH ESOPHAGEAL DILATION;  Surgeon: Claudis PENNER Golda, MD;  [facility]: [facility];  Service: Endoscopy;  Laterality: N/A;  200-moved to 255 Ann to notify pt  ESOPHAGOGASTRODUODENOSCOPY (EGD) WITH PROPOFOL  N/A 05/28/2022  Procedure: ESOPHAGOGASTRODUODENOSCOPY (EGD) WITH PROPOFOL ;  Surgeon: Eartha Angelia Sieving, MD;  [facility]: [facility];  Service: Gastroenterology;  Laterality: N/A;  1030 ASA 2  ESOPHAGOGASTRODUODENOSCOPY (EGD) WITH PROPOFOL  N/A 07/02/2022  Procedure: ESOPHAGOGASTRODUODENOSCOPY (EGD) WITH PROPOFOL ;  Surgeon: Eartha Angelia Sieving, MD;  [facility]: [facility];  Service: Gastroenterology;  Laterality: N/A;  105 ASA  2, pt knows new time per Anette Caldron  EYE SURGERY  10-2010  left eye removal of cataract  LEFT HEART CATH AND CORONARY ANGIOGRAPHY N/A 12/10/2022  Procedure: LEFT HEART CATH AND CORONARY ANGIOGRAPHY;  Surgeon: Wonda Sharper, MD;  Location: [facility] INVASIVE CV LAB;  Service: Cardiovascular;   Laterality: N/A;  MASS BIOPSY N/A 02/29/2024  Procedure: BIOPSY, THYROID ;  Surgeon: Karis Clunes, MD;  Location: [facility] OR;  Service: ENT;  Laterality: N/A;  TRACHEOSTOMY TUBE PLACEMENT N/A 02/29/2024  Procedure: CREATION, TRACHEOSTOMY;  Surgeon: Karis Clunes, MD;  Location: [facility] OR;  Service: ENT;  Laterality: N/A;  WITH THYROID  BIOPSY  TUBAL LIGATION  1984 Leita SAILOR., M.A. CCC-SLP Acute Rehabilitation Services Office: [phone] Secure chat preferred 03/01/2024, 3:23 PM  DG Abd Portable 1V Result Date: 02/29/2024 CLINICAL DATA:  NG placement. EXAM: PORTABLE ABDOMEN - 1 VIEW COMPARISON:  None Available. FINDINGS: Partially visualized enteric tube with tip likely in the distal stomach. No bowel dilatation. Air is noted in the colon. Degenerative changes of the spine. No acute osseous pathology. IMPRESSION: Enteric tube with tip likely in the distal stomach. Electronically Signed   By: Vanetta Chou M.D.   On: 02/29/2024 14:30   CT SOFT TISSUE NECK W CONTRAST Result Date: 02/28/2024 CLINICAL DATA:  Thyroid  cancer staging. Soft tissue infection of the neck is suspected. EXAM: CT NECK WITH CONTRAST TECHNIQUE: Multidetector CT imaging of the neck was performed using the standard protocol following the bolus administration of intravenous contrast. RADIATION DOSE REDUCTION: This exam was performed according to the departmental dose-optimization program which includes automated exposure control, adjustment of the mA and/or kV according to patient size and/or use of iterative reconstruction technique. CONTRAST:  75mL OMNIPAQUE  IOHEXOL  350 MG/ML SOLN COMPARISON:  CT of the neck dated February 25, 2024. FINDINGS: A peripherally enhancing, centrally hypodense lesion is again demonstrated within the right thyroid  fossa measuring approximately 4.3 x 2.6 x 3.7 cm. There is an ovoid calculus present within the lesion, which measures approximately 13 x 9 x 14 mm. The collection abuts the right lateral surface of the trachea and causes left  lateral displacement, as before. Pharynx and larynx: There has been interval improvement of retropharyngeal edema. There is extrinsic compression of the left piriform sinus. The laryngeal soft tissues are unremarkable. Salivary glands: No inflammation, mass, or stone. Thyroid : Patent small nodules again demonstrated within the left lobe of the thyroid . The right thyroid  fossa is occupied by the peripherally enhancing lesion described above. Lymph nodes: There are mildly enlarged centrally hypodense right level 2 and 3 lymph nodes again demonstrated, which measure up to approximately 11 mm in short axis. There are no abnormally enlarged or morphologically suspicious lymph nodes present on the left. Vascular: Patent and grossly unremarkable. Limited intracranial: Negative. Visualized orbits: Status post bilateral lens replacement. Mastoids and visualized paranasal sinuses: Clear. 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 No osseous lesions are present. Upper chest: The lung apices are clear. Other: None. IMPRESSION: 1. Persistent irregular, peripherally enhancing, centrally hypodense lesion within the right thyroid  fossa, with associated mild right cervical lymphadenopathy. The lymph nodes are centrally hypodense and may represent suppurative or necrotic nodes. The findings are concerning for metastatic thyroid  cancer, right thyroid  abscess with suppurative lymphadenopathy or a combination of the two. Electronically Signed   By: Evalene Coho M.D.   On: 02/28/2024 13:44   CT CHEST ABDOMEN PELVIS W CONTRAST Result Date: 02/28/2024 CLINICAL DATA:  Thyroid  cancer.  Metastatic disease evaluation. EXAM: CT CHEST,  ABDOMEN, AND PELVIS WITH CONTRAST TECHNIQUE: Multidetector CT imaging of the chest, abdomen and pelvis was performed following the standard protocol during bolus administration of intravenous contrast. RADIATION DOSE REDUCTION: This exam was performed according to  the departmental dose-optimization program which includes automated exposure control, adjustment of the mA and/or kV according to patient size and/or use of iterative reconstruction technique. CONTRAST:  75mL OMNIPAQUE  IOHEXOL  350 MG/ML SOLN COMPARISON:  Chest radiograph dated 1625.  Neck CT dated 02/25/2024. FINDINGS: CT CHEST FINDINGS Cardiovascular: No cardiomegaly or pericardial effusion. There is 2 vessel coronary vascular calcification. Advanced atherosclerotic calcification of the thoracic aorta. No aneurysmal dilatation or dissection. The origins of the great vessels of the aortic arch and the central pulmonary arteries are patent. Mediastinum/Nodes: No hilar or mediastinal adenopathy. Small hiatal hernia. The esophagus is grossly unremarkable. Partially visualized hypodense mass or collection in the right lateral neck. See neck CT report. Lungs/Pleura: No focal consolidation, pleural effusion, pneumothorax. The central airways are patent. Musculoskeletal: Osteopenia with degenerative changes of the spine. No acute osseous pathology. CT ABDOMEN PELVIS FINDINGS No intra-abdominal free air or free fluid. Hepatobiliary: Small liver cysts. No biliary dilatation. Multiple gallstones. No pericholecystic fluid or evidence of acute cholecystitis by CT. Pancreas: Unremarkable. No pancreatic ductal dilatation or surrounding inflammatory changes. Spleen: Normal in size without focal abnormality. Adrenals/Urinary Tract: The adrenal glands unremarkable. There is no hydronephrosis on either side. There is symmetric enhancement and excretion of contrast by both kidneys. The visualized ureters and urinary bladder appear unremarkable. Stomach/Bowel: Small hiatal hernia. There is no bowel obstruction or active inflammation. The appendix is normal. Vascular/Lymphatic: Advanced aortoiliac atherosclerotic disease. The IVC is unremarkable. No portal venous gas. There is no adenopathy. Reproductive: The uterus is grossly  unremarkable. No suspicious adnexal masses. Other: None Musculoskeletal: Osteopenia with degenerative changes of spine. No acute osseous pathology. IMPRESSION: 1. No acute intrathoracic, abdominal, or pelvic pathology. No metastatic disease in the chest, abdomen, or pelvis. 2. Partially visualized right neck mass/collection. See neck CT report. 3. Cholelithiasis. 4.  Aortic Atherosclerosis (ICD10-I70.0). Electronically Signed   By: Vanetta Chou M.D.   On: 02/28/2024 13:22   DG Chest Port 1 View Result Date: 02/28/2024 CLINICAL DATA:  Shortness of breath. EXAM: PORTABLE CHEST 1 VIEW COMPARISON:  02/26/2024 FINDINGS: The lungs are clear without focal pneumonia, edema, pneumothorax or pleural effusion. Cardiopericardial silhouette is at upper limits of normal for size. No acute bony abnormality. Telemetry leads overlie the chest. IMPRESSION: No active disease. Electronically Signed   By: Camellia Candle M.D.   On: 02/28/2024 06:15   DG Chest Port 1 View Result Date: 02/26/2024 CLINICAL DATA:  [id number] SOB (shortness of breath) [id number] EXAM: PORTABLE CHEST - 1 VIEW COMPARISON:  07/04/2007. FINDINGS: Cardiac silhouette is unremarkable. No pneumothorax or pleural effusion. The lungs are clear. Aorta is calcified. The visualized skeletal structures are unremarkable. IMPRESSION: No acute cardiopulmonary process. Electronically Signed   By: Fonda Field M.D.   On: 02/26/2024 06:37   CT Soft Tissue Neck W Contrast Result Date: 02/25/2024 CLINICAL DATA:  Thyroid  nodule, recent diagnosis of thyroid  cancer. Neck pain, throat clearing, concern for new mass or swelling. EXAM: CT NECK WITH CONTRAST TECHNIQUE: Multidetector CT imaging of the neck was performed using the standard protocol following the bolus administration of intravenous contrast. RADIATION DOSE REDUCTION: This exam was performed according to the departmental dose-optimization program which includes automated exposure control, adjustment of the mA and/or kV  according to patient size and/or use of iterative reconstruction technique.  CONTRAST:  75mL OMNIPAQUE  IOHEXOL  300 MG/ML  SOLN COMPARISON:  None Available. FINDINGS: There is a 4.1 x 2.5 x 3.9 cm heterogeneous centrally hypoattenuating mass centered in the right lower neck at the level of the thyroid  which demonstrates mild areas of peripherally enhancing soft tissue. There is a prominent calcification within the central/posterior aspect of the mass measuring up to 1.2 cm in diameter. There is associated mass effect with leftward deviation of the airway from the supraglottic airway to the extra thoracic trachea. The medial aspect of the mass abuts and possibly involves the right aspect of the thyroid  cartilage. There is retropharyngeal extension of the mass at the level of the cricoid cartilage. The mass abuts the right lateral aspect of the hypopharynx and cervical esophagus. There is retropharyngeal edema noted extending from C2 to the level of C5 measuring up to 6 mm in thickness. There is additional edema abutting the right carotid space without evidence of significant vascular narrowing. Additional masslike peripherally enhancing soft tissue involving the right sternocleidomastoid muscle observed on series 2 images 52-60. No retrosternal extension of the mass. Pharynx and larynx: Large thyroid  mass as above with mass effect on the airway. The nasopharynx is symmetric. Symmetric appearance of the palatine tonsils. The oral cavity, floor of mouth, and base of tongue are unremarkable. Normal appearance of the epiglottis. Asymmetry of the piriform sinuses noted. The paraglottic fat appears unremarkable. There is asymmetry of the right laryngeal ventricle with slight medial positioning of the right vocal folds. Salivary glands: No inflammation, mass, or stone. Thyroid : Right thyroid  mass as above. Subcentimeter nodule in the inferior left thyroid  lobe. Lymph nodes: Right level 2A cervical node measuring 0.8 cm in  short axis with possible central hypoattenuation concerning for central necrosis (series 2, image 42). Additional right level 2 B/3 cervical nodes measuring up to 1.0 cm in short axis. Vascular: Moderate atherosclerosis of the visualized aortic arch. Thyroid  mass abuts the right common carotid artery with lateral displacement of the vessel. There is edema surrounding the right common carotid artery with questionable wall thickening without evidence of vascular narrowing. Atherosclerosis at the carotid bifurcation likely with at least moderate stenosis. Limited intracranial: Limited visualization of intracranial structures without acute abnormality. Visualized orbits: Bilateral lens replacement. Orbits otherwise unremarkable. Mastoids and visualized paranasal sinuses: Mild mucosal thickening in the ethmoid sinuses. Mastoid air cells are clear. Skeleton: No acute or aggressive process. Upper chest: Negative. Other: None. IMPRESSION: 4.1 x 2.5 x 3.9 cm heterogeneous mass in the right anterior neck at the level of the thyroid  concerning for residual/recurrent thyroid  malignancy. Associated mass effect and leftward shift of the airway without significant airway narrowing. Irregularity of the right aspect of the thyroid  cartilage adjacent to the mass concerning for possible invasion. 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 Recommend correlation with direct visualization. Retropharyngeal extension of the mass which abuts the lower hypopharynx/cervical esophagus. Associated retropharyngeal edema. Edema noted along the right carotid space without significant vascular narrowing. Enlarged right level 2 and level 3 cervical nodes several of which demonstrate hypoattenuation concerning for metastatic lymphadenopathy with central necrosis. Lateral displacement of the right common carotid artery. Edema along the vessel with possible wall thickening which may reflect reactive inflammatory changes. No significant  irregularity or narrowing to suggest vascular invasion. Electronically Signed   By: Donnice Mania M.D.   On: 02/25/2024 13:47      IMPRESSION/PLAN:   ICD-10-CM   1. Anaplastic thyroid  carcinoma (HCC)  C73  Today, I talked to the patient and her son, daughter, and husband about the findings and work-up thus far.  We discussed the patient's diagnosis of anaplastic thyroid  carcinoma and general treatment for this, highlighting the role of radiotherapy in the management.  We discussed the available radiation techniques, and focused on the details of logistics and delivery as well as her poor prognosis.   Her family is extremely supportive and she is a lovely person with a strong faith, which gives her peace.  I have discussed her with otolaryngology and medical oncology.  She is not a surgical candidate for a candidate for systemic therapy at this time given her recent stroke and guarded prognosis. Today we talked in detail about her options moving forward.  We talked about enrolling on hospice and taking comfort measures only versus proceeding with palliative radiation therapy.  Palliative radiation therapy could be given over various treatment fractionations -in some circumstances patients pursue several weeks of radiation for local control of their disease.  However, given her guarded prognosis and comorbidities, I would like to minimize acute side effects while still giving her some palliative benefit.  I think the most balanced approach to treating her tumor would be for her to undergo 5 fractions of palliative radiation therapy.  The side effects would potentially include but not necessarily be limited to fatigue, skin irritation, esophagitis, mucositis,odynophagia, dysphagia (likely to be temporary in nature).  The goal of treatment would be to shrink down the tumor or at least slow down the growth of her disease so that the aggressive thyroid  tumor does not invade more local tissues or cause  worsening symptoms including hoarseness, dysphagia, throat pain, neck pain, skin invasion, compression or invasion of local vessels.  After lengthy discussion the patient and her family would like to pursue palliative radiation.  She would like to pursue a 5 fraction course.  We will simulate her today to plan her radiation therapy and start her treatment ASAP, today.  Consent form was signed and placed in her chart.  I answered their questions to the best of my ability.  The patient and her family are pleased with this plan.   If she chooses to do so, I think it is entirely reasonable for her to enroll in hospice at the completion of radiation therapy.   On date of service, in total, I spent 80 minutes on this encounter. Patient was seen in person.  __________________________________________   Lauraine Golden, MD  This document serves as a record of services personally performed by Lauraine Golden, MD. It was created on her behalf by Reymundo Cartwright, a trained medical scribe. The creation of this record is based on the scribe's personal observations and the provider's statements to them. This document has been checked and approved by the attending provider.

## 2024-03-05 NOTE — Progress Notes (Signed)
 Palliative-   Consult received, chart reviewed.  Discussed with attending MD Dr. Ada Acres.  Patient pending oncology and radiation consults- likely to transfer to [facility].  Per discussion Palliative consult deferred until after oncology/radiation consults completed.   Micki Alas, AGNP-C Palliative Medicine  No charge

## 2024-03-05 NOTE — Plan of Care (Signed)
 Problem: Education: Goal: Ability to describe self-care measures that may prevent or decrease complications (Diabetes Survival Skills Education) will improve 03/05/2024 0536 by Les Rao, RN Outcome: Progressing 03/05/2024 0527 by Les Rao, RN Outcome: Progressing Goal: Individualized Educational Video(s) 03/05/2024 0536 by Les Rao, RN Outcome: Progressing 03/05/2024 0527 by Les Rao, RN Outcome: Progressing   Problem: Coping: Goal: Ability to adjust to condition or change in health will improve 03/05/2024 0536 by Les Rao, RN Outcome: Progressing 03/05/2024 0527 by Les Rao, RN Outcome: Progressing   Problem: Fluid Volume: Goal: Ability to maintain a balanced intake and output will improve 03/05/2024 0536 by Les Rao, RN Outcome: Progressing 03/05/2024 0527 by Les Rao, RN Outcome: Progressing   Problem: Health Behavior/Discharge Planning: Goal: Ability to identify and utilize available resources and services will improve 03/05/2024 0536 by Les Rao, RN Outcome: Progressing 03/05/2024 0527 by Les Rao, RN Outcome: Progressing Goal: Ability to manage health-related needs will improve 03/05/2024 0536 by Les Rao, RN Outcome: Progressing 03/05/2024 0527 by Les Rao, RN Outcome: Progressing   Problem: Metabolic: Goal: Ability to maintain appropriate glucose levels will improve 03/05/2024 0536 by Les Rao, RN Outcome: Progressing 03/05/2024 0527 by Les Rao, RN Outcome: Progressing   Problem: Nutritional: Goal: Maintenance of adequate nutrition will improve 03/05/2024 0536 by Les Rao, RN Outcome: Progressing 03/05/2024 0527 by Les Rao, RN Outcome: Progressing Goal: Progress toward achieving an optimal weight will improve 03/05/2024 0536 by Les Rao, RN Outcome: Progressing 03/05/2024 0527 by Les Rao, RN Outcome:  Progressing   Problem: Skin Integrity: Goal: Risk for impaired skin integrity will decrease 03/05/2024 0536 by Les Rao, RN Outcome: Progressing 03/05/2024 0527 by Les Rao, RN Outcome: Progressing   Problem: Tissue Perfusion: Goal: Adequacy of tissue perfusion will improve 03/05/2024 0536 by Les Rao, RN Outcome: Progressing 03/05/2024 0527 by Les Rao, RN Outcome: Progressing   Problem: Education: Goal: Knowledge of General Education information will improve Description: Including pain rating scale, medication(s)/side effects and non-pharmacologic comfort measures 03/05/2024 0536 by Les Rao, RN Outcome: Progressing 03/05/2024 0527 by Les Rao, RN Outcome: Progressing   Problem: Health Behavior/Discharge Planning: Goal: Ability to manage health-related needs will improve 03/05/2024 0536 by Les Rao, RN Outcome: Progressing 03/05/2024 0527 by Les Rao, RN Outcome: Progressing   Problem: Clinical Measurements: Goal: Ability to maintain clinical measurements within normal limits will improve 03/05/2024 0536 by Les Rao, RN Outcome: Progressing 03/05/2024 0527 by Les Rao, RN Outcome: Progressing Goal: Will remain free from infection 03/05/2024 0536 by Les Rao, RN Outcome: Progressing 03/05/2024 0527 by Les Rao, RN Outcome: Progressing Goal: Diagnostic test results will improve 03/05/2024 0536 by Les Rao, RN Outcome: Progressing 03/05/2024 0527 by Les Rao, RN Outcome: Progressing Goal: Respiratory complications will improve 03/05/2024 0536 by Les Rao, RN Outcome: Progressing 03/05/2024 0527 by Les Rao, RN Outcome: Progressing Goal: Cardiovascular complication will be avoided 03/05/2024 0536 by Les Rao, RN Outcome: Progressing 03/05/2024 0527 by Les Rao, RN Outcome: Progressing   Problem: Activity: Goal: Risk  for activity intolerance will decrease 03/05/2024 0536 by Les Rao, RN Outcome: Progressing 03/05/2024 0527 by Les Rao, RN Outcome: Progressing   Problem: Nutrition: Goal: Adequate nutrition will be maintained 03/05/2024 0536 by Les Rao, RN Outcome: Progressing 03/05/2024 0527 by Les Rao, RN Outcome: Progressing  Problem: Coping: Goal: Level of anxiety will decrease 03/05/2024 0536 by Les Rao, RN Outcome: Progressing 03/05/2024 0527 by Les Rao, RN Outcome: Progressing   Problem: Elimination: Goal: Will not experience complications related to bowel motility 03/05/2024 0536 by Les Rao, RN Outcome: Progressing 03/05/2024 0527 by Les Rao, RN Outcome: Progressing Goal: Will not experience complications related to urinary retention 03/05/2024 0536 by Les Rao, RN Outcome: Progressing 03/05/2024 0527 by Les Rao, RN Outcome: Progressing   Problem: Pain Managment: Goal: General experience of comfort will improve and/or be controlled 03/05/2024 0536 by Les Rao, RN Outcome: Progressing 03/05/2024 0527 by Les Rao, RN Outcome: Progressing   Problem: Safety: Goal: Ability to remain free from injury will improve 03/05/2024 0536 by Les Rao, RN Outcome: Progressing 03/05/2024 0527 by Les Rao, RN Outcome: Progressing   Problem: Skin Integrity: Goal: Risk for impaired skin integrity will decrease 03/05/2024 0536 by Les Rao, RN Outcome: Progressing 03/05/2024 0527 by Les Rao, RN Outcome: Progressing   Problem: Education: Goal: Knowledge of disease or condition will improve 03/05/2024 0536 by Les Rao, RN Outcome: Progressing 03/05/2024 0527 by Les Rao, RN Outcome: Progressing Goal: Knowledge of secondary prevention will improve (MUST DOCUMENT ALL) 03/05/2024 0536 by Les Rao, RN Outcome: Progressing 03/05/2024  0527 by Les Rao, RN Outcome: Progressing Goal: Knowledge of patient specific risk factors will improve (DELETE if not current risk factor) 03/05/2024 0536 by Les Rao, RN Outcome: Progressing 03/05/2024 0527 by Les Rao, RN Outcome: Progressing   Problem: Ischemic Stroke/TIA Tissue Perfusion: Goal: Complications of ischemic stroke/TIA will be minimized 03/05/2024 0536 by Les Rao, RN Outcome: Progressing 03/05/2024 0527 by Les Rao, RN Outcome: Progressing   Problem: Coping: Goal: Will verbalize positive feelings about self 03/05/2024 0536 by Les Rao, RN Outcome: Progressing 03/05/2024 0527 by Les Rao, RN Outcome: Progressing Goal: Will identify appropriate support needs 03/05/2024 0536 by Les Rao, RN Outcome: Progressing 03/05/2024 0527 by Les Rao, RN Outcome: Progressing   Problem: Health Behavior/Discharge Planning: Goal: Ability to manage health-related needs will improve 03/05/2024 0536 by Les Rao, RN Outcome: Progressing 03/05/2024 0527 by Les Rao, RN Outcome: Progressing Goal: Goals will be collaboratively established with patient/family 03/05/2024 0536 by Les Rao, RN Outcome: Progressing 03/05/2024 0527 by Les Rao, RN Outcome: Progressing   Problem: Self-Care: Goal: Ability to participate in self-care as condition permits will improve 03/05/2024 0536 by Les Rao, RN Outcome: Progressing 03/05/2024 0527 by Les Rao, RN Outcome: Progressing Goal: Verbalization of feelings and concerns over difficulty with self-care will improve 03/05/2024 0536 by Les Rao, RN Outcome: Progressing 03/05/2024 0527 by Les Rao, RN Outcome: Progressing Goal: Ability to communicate needs accurately will improve 03/05/2024 0536 by Les Rao, RN Outcome: Progressing 03/05/2024 0527 by Les Rao, RN Outcome: Progressing    Problem: Nutrition: Goal: Risk of aspiration will decrease 03/05/2024 0536 by Les Rao, RN Outcome: Progressing 03/05/2024 0527 by Les Rao, RN Outcome: Progressing Goal: Dietary intake will improve 03/05/2024 0536 by Les Rao, RN Outcome: Progressing 03/05/2024 0527 by Les Rao, RN Outcome: Progressing

## 2024-03-05 NOTE — Inpatient Diabetes Management (Signed)
 Inpatient Diabetes Program Recommendations  AACE/ADA: New Consensus Statement on Inpatient Glycemic Control (2015)  Target Ranges:  Prepandial:   less than 140 mg/dL      Peak postprandial:   less than 180 mg/dL (1-2 hours)      Critically ill patients:  140 - 180 mg/dL   Lab Results  Component Value Date   GLUCAP 354 (H) 03/05/2024   HGBA1C 6.9 (A) 01/03/2024    Review of Glycemic Control  Diabetes history: DM 2 Outpatient Diabetes medications: Trulicity  1.5 mg Weekly, Tresiba  30 units qhs, metformin  500 mg Daily Current orders for Inpatient glycemic control:  Semglee  20 units Novolog  0-20 units Q4 hours Osmolite 45 ml/hour  Inpatient Diabetes Program Recommendations:    -   Increase Semglee  to 30 units -   Add Novolog  6 units Q4 Tube Feed Coverage  Thanks,  Eloise Hake RN, MSN, BC-ADM Inpatient Diabetes Coordinator Team Pager [phone] (8a-5p)

## 2024-03-06 ENCOUNTER — Ambulatory Visit
Admit: 2024-03-06 | Discharge: 2024-03-06 | Disposition: A | Discharge status: Home / Self Care | Attending: Radiation Oncology | Admitting: Radiation Oncology

## 2024-03-06 ENCOUNTER — Other Ambulatory Visit: Payer: Self-pay

## 2024-03-06 DIAGNOSIS — Z930 Tracheostomy status: Secondary | ICD-10-CM | POA: Diagnosis not present

## 2024-03-06 DIAGNOSIS — C73 Malignant neoplasm of thyroid gland: Secondary | ICD-10-CM | POA: Diagnosis not present

## 2024-03-06 DIAGNOSIS — R221 Localized swelling, mass and lump, neck: Secondary | ICD-10-CM | POA: Diagnosis not present

## 2024-03-06 LAB — CBC WITH DIFFERENTIAL/PLATELET
Abs Immature Granulocytes: 0.13 10*3/uL — ABNORMAL HIGH (ref 0.00–0.07)
Basophils Absolute: 0.1 10*3/uL (ref 0.0–0.1)
Basophils Relative: 0 %
Eosinophils Absolute: 0.8 10*3/uL — ABNORMAL HIGH (ref 0.0–0.5)
Eosinophils Relative: 5 %
HCT: 30.7 % — ABNORMAL LOW (ref 36.0–46.0)
Hemoglobin: 9.6 g/dL — ABNORMAL LOW (ref 12.0–15.0)
Immature Granulocytes: 1 %
Lymphocytes Relative: 14 %
Lymphs Abs: 2.3 10*3/uL (ref 0.7–4.0)
MCH: 27.7 pg (ref 26.0–34.0)
MCHC: 31.3 g/dL (ref 30.0–36.0)
MCV: 88.5 fL (ref 80.0–100.0)
Monocytes Absolute: 1.5 10*3/uL — ABNORMAL HIGH (ref 0.1–1.0)
Monocytes Relative: 9 %
Neutro Abs: 11.4 10*3/uL — ABNORMAL HIGH (ref 1.7–7.7)
Neutrophils Relative %: 71 %
Platelets: 394 10*3/uL (ref 150–400)
RBC: 3.47 MIL/uL — ABNORMAL LOW (ref 3.87–5.11)
RDW: 14.3 % (ref 11.5–15.5)
WBC: 16.1 10*3/uL — ABNORMAL HIGH (ref 4.0–10.5)
nRBC: 0 % (ref 0.0–0.2)

## 2024-03-06 LAB — GLUCOSE, CAPILLARY
Glucose-Capillary: 258 mg/dL — ABNORMAL HIGH (ref 70–99)
Glucose-Capillary: 259 mg/dL — ABNORMAL HIGH (ref 70–99)
Glucose-Capillary: 265 mg/dL — ABNORMAL HIGH (ref 70–99)
Glucose-Capillary: 266 mg/dL — ABNORMAL HIGH (ref 70–99)
Glucose-Capillary: 271 mg/dL — ABNORMAL HIGH (ref 70–99)
Glucose-Capillary: 290 mg/dL — ABNORMAL HIGH (ref 70–99)
Glucose-Capillary: 303 mg/dL — ABNORMAL HIGH (ref 70–99)

## 2024-03-06 LAB — RAD ONC ARIA SESSION SUMMARY
Course Elapsed Days: 0
Plan Fractions Treated to Date: 1
Plan Prescribed Dose Per Fraction: 4.4 Gy
Plan Total Fractions Prescribed: 5
Plan Total Prescribed Dose: 22 Gy
Reference Point Dosage Given to Date: 4.4 Gy
Reference Point Session Dosage Given: 4.4 Gy
Session Number: 1

## 2024-03-06 LAB — COMPREHENSIVE METABOLIC PANEL WITH GFR
ALT: 55 U/L — ABNORMAL HIGH (ref 0–44)
AST: 46 U/L — ABNORMAL HIGH (ref 15–41)
Albumin: 2.5 g/dL — ABNORMAL LOW (ref 3.5–5.0)
Alkaline Phosphatase: 79 U/L (ref 38–126)
Anion gap: 11 (ref 5–15)
BUN: 45 mg/dL — ABNORMAL HIGH (ref 8–23)
CO2: 25 mmol/L (ref 22–32)
Calcium: 9.3 mg/dL (ref 8.9–10.3)
Chloride: 99 mmol/L (ref 98–111)
Creatinine, Ser: 0.94 mg/dL (ref 0.44–1.00)
GFR, Estimated: >60 mL/min (ref >60)
Glucose, Bld: 250 mg/dL — ABNORMAL HIGH (ref 70–99)
Potassium: 4.2 mmol/L (ref 3.5–5.1)
Sodium: 135 mmol/L (ref 135–145)
Total Bilirubin: 0.2 mg/dL (ref 0.0–1.2)
Total Protein: 6 g/dL — ABNORMAL LOW (ref 6.5–8.1)

## 2024-03-06 LAB — MAGNESIUM: Magnesium: 2.2 mg/dL (ref 1.7–2.4)

## 2024-03-06 LAB — PHOSPHORUS: Phosphorus: 3.6 mg/dL (ref 2.5–4.6)

## 2024-03-06 MED ORDER — ADULT MULTIVITAMIN W/MINERALS CH
1.0000 | ORAL_TABLET | Freq: Every day | ORAL | Status: DC
  Administered 2024-03-06 – 2024-03-13 (×8): 1 via ORAL
  Filled 2024-03-06 (×8): qty 1

## 2024-03-06 MED ORDER — FAMOTIDINE 20 MG PO TABS
40.0000 mg | ORAL_TABLET | Freq: Every day | ORAL | Status: DC
  Administered 2024-03-06 – 2024-03-13 (×8): 40 mg via ORAL
  Filled 2024-03-06 (×8): qty 2

## 2024-03-06 MED ORDER — ACETAMINOPHEN 325 MG PO TABS
650.0000 mg | ORAL_TABLET | Freq: Four times a day (QID) | ORAL | Status: DC | PRN
  Administered 2024-03-13: 650 mg via ORAL
  Filled 2024-03-06 (×2): qty 2

## 2024-03-06 MED ORDER — ACETAMINOPHEN 650 MG RE SUPP: 650.0000 mg | Freq: Four times a day (QID) | RECTAL | Status: DC | PRN

## 2024-03-06 MED ORDER — ASPIRIN 81 MG PO CHEW
81.0000 mg | CHEWABLE_TABLET | Freq: Every day | ORAL | Status: DC
  Administered 2024-03-06 – 2024-03-13 (×8): 81 mg via ORAL
  Filled 2024-03-06 (×8): qty 1

## 2024-03-06 MED ORDER — ONDANSETRON HCL 4 MG PO TABS: 4.0000 mg | ORAL_TABLET | Freq: Four times a day (QID) | ORAL | Status: DC | PRN

## 2024-03-06 MED ORDER — ONDANSETRON HCL 4 MG/2ML IJ SOLN
4.0000 mg | Freq: Four times a day (QID) | INTRAMUSCULAR | Status: DC | PRN
  Filled 2024-03-06 (×2): qty 2

## 2024-03-06 MED ORDER — DILTIAZEM HCL 30 MG PO TABS
30.0000 mg | ORAL_TABLET | Freq: Two times a day (BID) | ORAL | Status: DC
  Administered 2024-03-06 – 2024-03-13 (×16): 30 mg via ORAL
  Filled 2024-03-06 (×16): qty 1

## 2024-03-06 MED ORDER — POLYETHYLENE GLYCOL 3350 17 G PO PACK
17.0000 g | PACK | Freq: Every day | ORAL | Status: DC
  Administered 2024-03-06 – 2024-03-11 (×4): 17 g via ORAL
  Filled 2024-03-06 (×4): qty 1

## 2024-03-06 NOTE — Progress Notes (Signed)
 Subjective: No new issues overnight. Now at [facility] for XRT treatment. Daughter and husband at bedside.  Objective: Vital signs in last 24 hours: Temp:  [98.3 F (36.8 C)-98.8 F (37.1 C)] 98.5 F (36.9 C) (06/17 0355) Pulse Rate:  [80-97] 80 (06/17 0423) Resp:  [18-22] 18 (06/17 0423) BP: (124-150)/(44-64) 124/64 (06/17 0355) SpO2:  [97 %-100 %] 100 % (06/17 0355) FiO2 (%):  [21 %-28 %] 28 % (06/17 0423)  Exam: General: Sleepy but arousable. Eyes: Pupils are equal, round, reactive to light. Extraocular motion is intact.  Ears: Examination of the ears shows normal auricles and external auditory canals bilaterally. Nose: Nasal examination shows normal mucosa, septum, turbinates.  A Dobhoff tube is in place. Face: Facial examination shows no asymmetry. Palpation of the face elicit no significant tenderness.  Mouth: Oral cavity examination shows no mucosal abnormalities.  Neck: Palpation of the neck reveals a large firm and fixed mass within the right thyroid  lobe.  The tracheostomy tube is in place and patent.  No bleeding is noted.  Recent Labs    03/05/24 0435 03/06/24 0614  WBC 15.8* 16.1*  HGB 10.1* 9.6*  HCT 31.1* 30.7*  PLT 397 394   Recent Labs    03/05/24 0435 03/06/24 0614  NA 132* 135  K 4.4 4.2  CL 98 99  CO2 25 25  GLUCOSE 377* 250*  BUN 36* 45*  CREATININE 1.01* 0.94  CALCIUM  8.8* 9.3    Medications: I have reviewed the patient's current medications. Scheduled:  aspirin   81 mg Oral Daily   clopidogrel   75 mg Oral Daily   diltiazem   30 mg Oral BID   famotidine   40 mg Oral Daily   feeding supplement (PROSource TF20)  60 mL Per Tube Daily   free water   100 mL Per Tube Q4H   heparin   5,000 Units Subcutaneous Q8H   insulin  aspart  0-20 Units Subcutaneous Q4H   insulin  aspart  6 Units Subcutaneous Q4H   insulin  glargine-yfgn  30 Units Subcutaneous QHS   latanoprost   1 drop Both Eyes QHS   levofloxacin  750 mg Oral Q48H   metroNIDAZOLE   500 mg Oral  Q12H   multivitamin with minerals  1 tablet Oral Daily   mouth rinse  15 mL Mouth Rinse 4 times per day   polyethylene glycol  17 g Oral Daily   rosuvastatin   40 mg Oral Daily   timolol   1 drop Both Eyes BID   traZODone  50 mg Oral QHS   Continuous:  feeding supplement (OSMOLITE 1.5 CAL) 1,000 mL (03/05/24 2251)    Assessment/Plan: Right anaplastic thyroid  carcinoma, rapidly enlarging, with thyroid  cartilage invasion.  The diagnosis is discussed with the daughter and her husband. 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  The family and neurology have decided to proceed with no intervention. - Uncuffed #6 Shiley tracheostomy tube is in place and patent. - Not a surgical candidate. - Palliative XRT per Dr. [name]. - Comfort care.   LOS: 10 days   Nancy Sutton W Nancy Sutton 03/06/2024, 11:39 AM

## 2024-03-06 NOTE — Progress Notes (Signed)
 Oncology Nurse Navigator Documentation   Met with patient during initial consult with Dr. [name]. She was accompanied by her daughter, husband, and son on speaker phone.  Further introduced myself as his/their Navigator, explained my role as a member of the Care Team. She will receive 5 fractions of palliative radiation while she remains an inpatient at [facility] hospital.  They verbalized understanding of information provided. I encouraged them to call with questions/concerns moving forward.  Lynetta Saran, RN, BSN, OCN Head & Neck Oncology Nurse Navigator [facility] [place] [phone]

## 2024-03-06 NOTE — Hospital Course (Signed)
 78 y.o female with past medical history of stroke with residual left hemianopia, hypertension, diabetes, CAD status post stent with history of thyroid  mass being followed by general surgery with plans for surgical intervention presented to hospital with difficulty clearing her throat, and worsening neck pain.  Patient was transferred from [facility] to [facility] for evaluation by general surgery.  During hospitalization patient was seen by ENT. Patient was seen by Dr. Bobetta Burrows with plan for tracheostomy 6/11, and open thyroid  biopsy for definitive histologic diagnosis, medical oncology, radiation oncology has been involved as well, as well Dr. Teo in communication with ENT at [facility].  Pathology showed anaplastic thyroid  carcinoma.  Patient was then transferred to transferring to [facility] for radiation.  Oncology and radiation oncology have been consulted.  Palliative care on board as well.    Goals of Care Will need continued goals of care discussion.  Looks like previous provider had spoken about goals of care and were not clear completely.   Anaplastic Thyroid  Carcinoma  s/p open thyroid  biopsy and tracheostomy -> changed to uncuffed tube per Dr. [name]. 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  CT scan of the abdomen chest and pelvis without any metastatic disease.  CT neck with irregular peripherally enhancing centrally hypodense lesion within the right thyroid  fossa with associated mild right cervical lymphadenopathy.  LN's centrally hypodense (suppurative or necrotic) (concerning for metastatic thyroid  cancer - see report).  Patient has been transferred to [facility] long for possible radiation.  oncology (Pasam) and radiation oncology [name]) have been consulted, appreciate assistance.  Has been put on cortrak tube for tube feeding.  Speech therapy has evaluated the patient.   Systemic Inflammatory Response Syndrome Fever has improved.  Still has leukocytosis.  Chest  x-ray without any obvious infiltrate.  UA with positive white cells and leukocyte esterase.  Follow-up urine culture blood culture.  Currently on Levaquin Flagyl  given ampicillin allergy   Acute recurrent stroke History of stroke with left-sided hemianopsia.  MRI with patchy acute ischemic nonhemorrhagic right frontal, parietal, and occipital infarcts (watershed in distribution), chronic R PCA distribution infarct.  Loss of normal flow void within cavernous R ICA related to previously identified focal occlusion vs high grade stenosis.   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  Distal reconstitution via colateral flow eith via the ophthalmic artery or cross the circle of willis.  Favored to reflect severe high grade progressive stenosis. Echo with normal EF.  Hemoglobin A1c of 6.9.  Seen by neurology during hospitalization.  Okay to start DAPT as per ENT.  Continue statin. Speech therapy has recommended nectar thick liquids.  Continue PT OT.     CAD s/p PCI in March 2024.  - Has seen her cardiologist and was cleared to undergo surgical procedure on 02/22/2024, continue Plavix , aspirin , statin.     Paroxysmal A-fib in 2024.  1 episode in the past.  This was in conjunction of her NSTEMI.  Not on anticoagulation, follows with her cardiologist, continue low-dose Cardizem .   Dyslipidemia.  Continue combination of statin and Zetia.   Hypomagnesemia.  Replaced.  Latest magnesium  of 2.2   Hypokalemia.  Replenished.  Latest potassium of 4.2   Essential hypertension.  On Cardizem .  Blood pressure seems to be stable   DM type II  Latest hemoglobin A1c 12/2023 was 6.9 continue insulin  regimen.   Glaucoma Continue timolol 

## 2024-03-06 NOTE — Progress Notes (Signed)
 SLP Cancellation Note  Patient Details Name: Nancy Sutton MRN: [id number] DOB: 09/08/1946   Cancelled treatment:       Reason Eval/Treat Not Completed: Other (comment) (Per ENT note, pt has not planned intervention - ? for ca and/or CVA?Aaron Aas  Palliative consult pending and pt undergoing palliative XRT at this time. Note she was not feeling well and having anxiety per OT note. Will follow up after palliative meeting.)  Maudie Sorrow, MS [facility] SLP Acute Rehab Services Office [phone]  Chantal Comment 03/06/2024, 5:32 PM

## 2024-03-06 NOTE — Progress Notes (Signed)
 Overnight Event  Patient's son concerned and calling nurse desk d/t patient's complaint of numbness and tingling in LUE and LLE, which has been resolved after repositioning.   VS have been done, NIHSS for 0000 done at 2300.  Night HCP, Rapid Response RN, Respiratory Therapist and 6E Charge RN were all still notified about patient's status.  NIHSS has been a total of 7-9 consistently for past 48h+. No Code stroke called.   Please read through for understanding of overnight event.    03/06/24 2300  Charting Type  Charting Type Reassessment  Focused Reassessment No Changes Neurological;Respiratory  Crockett Work Intensity Score (Update with each assessment and as needed)  Work Intensity Score (Level) 3  Level 3 Intensity B.Frequent assistance requests (patient or family)  Neurological  Neuro (WDL) X  Level of Consciousness Alert  Orientation Level Oriented to person;Oriented to place;Oriented to situation;Disoriented to time  Cognition Follows commands  R Pupil Size (mm) 3  R Pupil Shape Round  R Pupil Reaction Brisk  L Pupil Size (mm) 3  L Pupil Shape Round  L Pupil Reaction Brisk  Motor Function/Sensation Assessment Grip;Motor response;Sensation  R Hand Grip Moderate  L Hand Grip Weak   RUE Motor Response Purposeful movement  RUE Sensation Full sensation  RUE Motor Strength 5  LUE Motor Response Purposeful movement  LUE Sensation Full sensation  LUE Motor Strength 3  RLE Motor Response Purposeful movement  RLE Sensation Full sensation  RLE Motor Strength 5  LLE Motor Response No movement to painful stimulus  LLE Sensation Full sensation;Numbness (Numbness intermittently per pt)  LLE Motor Strength 1  Neuro Symptoms Fatigue  Glasgow Coma Scale  Eye Opening 4  Best Verbal Response (NON-intubated) 5 (AAOx3)  Best Motor Response 6  Glasgow Coma Scale Score 15  NIH Stroke Scale   Dizziness Present No  Headache Present No  Interval Other (Comment) (Q4H, also at  patient's son's request)  Level of Consciousness (1a.)    1  LOC Questions (1b. )    0  LOC Commands (1c. )    0  Best Gaze (2. )   0  Visual (3. )   0  Facial Palsy (4. )     0  Motor Arm, Left (5a. )    2  Motor Arm, Right (5b. )  0  Motor Leg, Left (6a. )   4  Motor Leg, Right (6b. )  1  Limb Ataxia (7. ) 1  Sensory (8. )   0  Best Language (9. )   0  Dysarthria (10. ) 0  Extinction/Inattention (11.)    0  Complete NIHSS TOTAL 9  Respiratory  Respiratory Pattern Tachypnea (RR of 26)     03/06/24 2320  Vitals  Temp 98.8 F (37.1 C)  Temp Source Oral  BP (!) 127/47  MAP (mmHg) 70  BP Location Right Arm  BP Method Automatic  Patient Position (if appropriate) Lying  Pulse Rate 86  Pulse Rate Source Dinamap  ECG Heart Rate 86  Resp (!) 26  Level of Consciousness  Level of Consciousness Alert  MEWS COLOR  MEWS Score Color Yellow  Oxygen Therapy  SpO2 100 %  O2 Device Tracheostomy Collar  O2 Flow Rate (L/min) 6 L/min  Patient Activity (if Appropriate) In bed  Pulse Oximetry Type Continuous  Pain Assessment  Pain Scale 0-10  Pain Score 0  PCA/Epidural/Spinal Assessment  Respiratory Pattern Tachypnea (RR of 26)  Glasgow Coma Scale  Eye  Opening 4  Best Verbal Response (NON-intubated) 5 (AAOx3)  Best Motor Response 6  Glasgow Coma Scale Score 15  MEWS Score  MEWS Temp 0  MEWS Systolic 0  MEWS Pulse 0  MEWS RR 2  MEWS LOC 0  MEWS Score 2  Provider Notification  Provider Name/Title J. Sharion Davidson, NP  Date Provider Notified 03/06/24  Time Provider Notified 2300  Method of Notification Rounds;Face-to-face  Notification Reason Requested by patient/family (Patient's son called front desk to explain that patient c/o of numbness in her LUE and LLE.)  Provider response In department;No new orders (Continue NIHSS, Charge RN (Tiffany) called rapid response RN to assess as well)  Date of Provider Response 03/06/24  Time of Provider Response 2301  Rapid Response  Notification  Name of Rapid Response RN Notified Genelle, RN  Date Rapid Response Notified 03/06/24  Time Rapid Response Notified 2306  Note  Patient Observations patient states numbness in LUE improved upon repositioning, patient's son also clarified LLE has intermittent numbness and tingling but improves upon repositioning also

## 2024-03-06 NOTE — Progress Notes (Signed)
 OT Cancellation Note  Patient Details Name: Nancy Sutton MRN: [id number] DOB: 08-21-1946   Cancelled Treatment:    Reason Eval/Treat Not Completed: Other (comment) Patient reported waiting on medications for nerves and pending radiation at this time. OT to continue to follow and check back 6/18.  Wynette Heckler, MS Acute Rehabilitation Department Office# [phone]  03/06/2024, 2:59 PM

## 2024-03-06 NOTE — Progress Notes (Incomplete)
 Has armband been applied?  Yes.    Does patient have an allergy to IV contrast dye?: No.   Has patient ever received premedication for IV contrast dye?: No.   Date of lab work: 03/06/2024 BUN: 45 CR: 0.94 eGFR: >60  Does patient take metformin ?: Yes.    Is eGFR >60?: Yes If no, when can patient resume? (Must be 48 hrs AFTER they receive IV contrast):    IV site: Right AC   There were no vitals taken for this visit.

## 2024-03-06 NOTE — Progress Notes (Signed)
 PROGRESS NOTE  Nancy Sutton:[id number] DOB: 03/21/1946 DOA: 02/25/2024 PCP: [name], MD   LOS: 10 days   Brief narrative:  78 y.o female with past medical history of stroke with residual left hemianopia, hypertension, diabetes, CAD status post stent with history of thyroid  mass being followed by general surgery with plans for surgical intervention presented to hospital with difficulty clearing her throat, and worsening neck pain.  Patient was transferred from [facility] to [facility] for evaluation by general surgery.  During hospitalization patient was seen by ENT. Patient was seen by Dr. Bobetta Burrows with plan for tracheostomy 6/11, and open thyroid  biopsy for definitive histologic diagnosis, medical oncology, radiation oncology has been involved as well, as well Dr. Teo in communication with ENT at [facility].  Pathology showed anaplastic thyroid  carcinoma.  Patient was then transferred to transferring to [facility] for radiation.  Oncology and radiation oncology have been consulted.  Palliative care on board as well.     Assessment/Plan: Principal Problem:   Neck mass Active Problems:   Hyperlipidemia   GERD (gastroesophageal reflux disease)   Cerebral infarction (HCC)   Uncontrolled type 1 diabetes mellitus with hyperglycemia (HCC)   NSTEMI (non-ST elevated myocardial infarction) (HCC)   Paroxysmal atrial fibrillation (HCC)   CAD (coronary artery disease)   Anaplastic thyroid  carcinoma (HCC)   Hoarseness   Thyroid  cancer (HCC)   Tracheostomy status (HCC)   Anaplastic Thyroid  Carcinoma  s/p open thyroid  biopsy and tracheostomy -> changed to uncuffed tube per Dr. [name]. 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  CT scan of the abdomen chest and pelvis without any metastatic disease.  CT neck with irregular peripherally enhancing centrally hypodense lesion within the right thyroid  fossa with associated mild right cervical lymphadenopathy.  LN's  centrally hypodense (suppurative or necrotic) (concerning for metastatic thyroid  cancer - see report).  Patient has been transferred to [facility] long for possible radiation.  oncology (Pasam) and radiation oncology [name]) have been consulted, appreciate assistance.  Has been put on cortrak tube for tube feeding.  Speech therapy has evaluated the patient.  Poor prognosis with this cancer which is not chemosensitive.   Systemic Inflammatory Response Syndrome Fever has improved.  Still has leukocytosis.  Chest x-ray without any obvious infiltrate.  UA with positive white cells and leukocyte esterase.  Follow-up urine culture blood culture.  Currently on Levaquin Flagyl  given ampicillin allergy   Acute recurrent stroke History of stroke with left-sided hemianopsia.  MRI with patchy acute ischemic nonhemorrhagic right frontal, parietal, and occipital infarcts (watershed in distribution), chronic R PCA distribution infarct.  Loss of normal flow void within cavernous R ICA related to previously identified focal occlusion vs high grade stenosis.   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  Distal reconstitution via colateral flow eith via the ophthalmic artery or cross the circle of willis.  Favored to reflect severe high grade progressive stenosis. Echo with normal EF.  Hemoglobin A1c of 6.9.  Seen by neurology during hospitalization.  Okay to start DAPT as per ENT.  Continue statin. Speech therapy has recommended nectar thick liquids.  Continue PT OT.     CAD s/p PCI in March 2024.  - Has seen her cardiologist and was cleared to undergo surgical procedure on 02/22/2024, continue Plavix , aspirin , statin.     Paroxysmal A-fib in 2024.  1 episode in the past.  This was in conjunction  of her NSTEMI.  Not on anticoagulation, follows with her cardiologist, continue low-dose Cardizem .   Dyslipidemia.  Continue combination of  statin and Zetia.   Hypomagnesemia.  Replaced.  Latest magnesium  of 2.2   Hypokalemia.  Replenished.  Latest potassium of 4.2   Essential hypertension.  On Cardizem .  Blood pressure seems to be stable   DM type II  Latest hemoglobin A1c 12/2023 was 6.9 continue insulin  regimen.   Glaucoma Continue timolol      Goals of Care Will need continued goals of care discussion.  Looks like previous provider had spoken about goals of care and were not clear completely.   DVT prophylaxis: heparin  injection 5,000 Units Start: 02/26/24 1400   Disposition: Uncertain at this time  Status is: Inpatient Remains inpatient appropriate because: Need for radiation treatment, pending clinical improvement.    Code Status:     Code Status: Full Code  Family Communication: Spoke with the patient's spouse at bedside  Consultants: Medical oncology Radiation oncology Palliative care ENT  Procedures: Status post tracheostomy and open biopsy of right thyroid  cancer on 02/29/2024 by ENT.  Anti-infectives:  Levaquin and metronidazole   Anti-infectives (From admission, onward)    Start     Dose/Rate Route Frequency Ordered Stop   03/04/24 1415  levofloxacin (LEVAQUIN) tablet 750 mg        750 mg Oral Every 48 hours 03/04/24 1315     03/04/24 1400  metroNIDAZOLE  (FLAGYL ) tablet 500 mg        500 mg Oral Every 12 hours 03/04/24 1309     02/29/24 1000  clindamycin  (CLEOCIN ) IVPB 900 mg        900 mg 100 mL/hr over 30 Minutes Intravenous  Once 02/29/24 0955 02/29/24 1106   02/29/24 0957  clindamycin  (CLEOCIN ) 900 MG/50ML IVPB       Note to Pharmacy: Clydell Darnel E: cabinet override      02/29/24 0957 02/29/24 1111        Subjective: Today, patient was seen and examined at bedside.  Patient states that she has some difficulty breathing but no pain denies any nausea or vomiting today.  Patient's spouse at bedside.  Objective: Vitals:   03/06/24 0355 03/06/24 0423  BP: 124/64   Pulse: 85 80   Resp: 18 18  Temp: 98.5 F (36.9 C)   SpO2: 100%    No intake or output data in the 24 hours ending 03/06/24 0907 Filed Weights   03/02/24 1410 03/03/24 0442  Weight: 53.7 kg 58.2 kg   Body mass index is 25.06 kg/m.   Physical Exam: GENERAL: Patient is alert awake and oriented. Not in obvious distress.  Elderly female, Communicative, able to voice.  Cortrak tube tube in place. HENT: No scleral pallor or icterus. Pupils equally reactive to light. Oral mucosa is moistUpper respiratory noises noted. NECK: Thyroid  tumor status post tracheostomy, CHEST coarse breath sounds noted bilaterally.  Diminished breath sounds. CVS: S1 and S2 heard, no murmur. Regular rate and rhythm.  ABDOMEN: Soft, non-tender, bowel sounds are present. EXTREMITIES: No edema. CNS: Cranial nerves are intact.  Left-sided weakness from previous stroke. SKIN: warm and dry without rashes.  Data Review: I have personally reviewed the following laboratory data and studies,  CBC: Recent Labs  Lab 03/01/24 0400 03/02/24 0836 03/03/24 1019 03/04/24 0549 03/05/24 0435 03/06/24 0614  WBC 16.0* 17.3* 13.3* 14.1* 15.8* 16.1*  NEUTROABS 14.0* 13.5*  --  11.0* 11.5* 11.4*  HGB 9.5* 10.0* 9.9* 10.3* 10.1* 9.6*  HCT  29.3* 30.8* 31.7* 31.8* 31.1* 30.7*  MCV 86.2 85.1 86.6 85.3 85.7 88.5  PLT 350 423* 376 389 397 394   Basic Metabolic Panel: Recent Labs  Lab 02/29/24 0534 03/01/24 0400 03/02/24 0836 03/03/24 1019 03/04/24 0549 03/05/24 0435 03/06/24 0614  NA 139 136 136 133* 132* 132* 135  K 3.3* 4.1 4.0 4.0 4.5 4.4 4.2  CL 107 104 101 101 99 98 99  CO2 25 22 25 23 26 25 25   GLUCOSE 102* 227* 137* 326* 380* 377* 250*  BUN 7* 21 18 27* 28* 36* 45*  CREATININE 0.77 0.78 0.73 0.90 0.91 1.01* 0.94  CALCIUM  8.9 8.9 8.9 8.9 8.9 8.8* 9.3  MG 1.8 2.1  --   --  2.0 2.2 2.2  PHOS 3.3 3.7  --   --  2.2* 2.7 3.6   Liver Function Tests: Recent Labs  Lab 03/04/24 0549 03/05/24 0435 03/06/24 0614  AST 28 45*  46*  ALT 31 40 55*  ALKPHOS 74 87 79  BILITOT 0.4 0.2 0.2  PROT 5.9* 6.5 6.0*  ALBUMIN 2.3* 2.3* 2.5*   No results for input(s): LIPASE, AMYLASE in the last 168 hours. No results for input(s): AMMONIA in the last 168 hours. Cardiac Enzymes: No results for input(s): CKTOTAL, CKMB, CKMBINDEX, TROPONINI in the last 168 hours. BNP (last 3 results) Recent Labs    02/28/24 0513 02/29/24 0534 03/01/24 0400  BNP 77.9 136.2* 192.2*    ProBNP (last 3 results) No results for input(s): PROBNP in the last 8760 hours.  CBG: Recent Labs  Lab 03/05/24 1646 03/05/24 2028 03/06/24 0006 03/06/24 0357 03/06/24 0754  GLUCAP 298* 185* 265* 258* 266*   Recent Results (from the past 240 hours)  Culture, blood (Routine X 2) w Reflex to ID Panel     Status: None (Preliminary result)   Collection Time: 03/04/24  3:06 PM   Specimen: BLOOD RIGHT HAND  Result Value Ref Range Status   Specimen Description BLOOD RIGHT HAND  Final   Special Requests   Final    BOTTLES DRAWN AEROBIC ONLY Blood Culture results may not be optimal due to an inadequate volume of blood received in culture bottles   Culture   Final    NO GROWTH 2 DAYS Performed at [facility], [address] [place], [place] [address]    Report Status PENDING  Incomplete  Culture, blood (Routine X 2) w Reflex to ID Panel     Status: None (Preliminary result)   Collection Time: 03/04/24  3:13 PM   Specimen: BLOOD LEFT ARM  Result Value Ref Range Status   Specimen Description BLOOD LEFT ARM  Final   Special Requests   Final    BOTTLES DRAWN AEROBIC AND ANAEROBIC Blood Culture adequate volume   Culture   Final    NO GROWTH 2 DAYS Performed at [facility], [address] [place], [place] [address]    Report Status PENDING  Incomplete     Studies: No results found.    Rosena Conradi, MD  [facility] 03/06/2024  If 7PM-7AM, please contact night-coverage

## 2024-03-06 NOTE — Progress Notes (Signed)
 Palliative-   Attempted to see patient- she was off the unit to Radiation consult, undergoing sim.   Chart reviewed- called patient's daughter Carmelina Chinchilla.   Plan to meet with patient and family tomorrow at 10am.   Reed Eifert, AGNP-C Palliative Medicine  No charge

## 2024-03-06 NOTE — Plan of Care (Signed)
  Problem: Education: Goal: Ability to describe self-care measures that may prevent or decrease complications (Diabetes Survival Skills Education) will improve Outcome: Progressing   Problem: Coping: Goal: Ability to adjust to condition or change in health will improve Outcome: Progressing   Problem: Coping: Goal: Level of anxiety will decrease Outcome: Progressing

## 2024-03-07 ENCOUNTER — Other Ambulatory Visit: Payer: Self-pay

## 2024-03-07 ENCOUNTER — Ambulatory Visit
Admit: 2024-03-07 | Discharge: 2024-03-07 | Disposition: A | Discharge status: Home / Self Care | Attending: Radiation Oncology

## 2024-03-07 DIAGNOSIS — Z515 Encounter for palliative care: Secondary | ICD-10-CM

## 2024-03-07 DIAGNOSIS — R221 Localized swelling, mass and lump, neck: Secondary | ICD-10-CM | POA: Diagnosis not present

## 2024-03-07 DIAGNOSIS — R490 Dysphonia: Secondary | ICD-10-CM

## 2024-03-07 DIAGNOSIS — C73 Malignant neoplasm of thyroid gland: Secondary | ICD-10-CM | POA: Diagnosis not present

## 2024-03-07 DIAGNOSIS — Z510 Encounter for antineoplastic radiation therapy: Secondary | ICD-10-CM | POA: Diagnosis not present

## 2024-03-07 DIAGNOSIS — I639 Cerebral infarction, unspecified: Secondary | ICD-10-CM | POA: Diagnosis not present

## 2024-03-07 DIAGNOSIS — Z930 Tracheostomy status: Secondary | ICD-10-CM | POA: Diagnosis not present

## 2024-03-07 LAB — CBC
HCT: 29.7 % — ABNORMAL LOW (ref 36.0–46.0)
Hemoglobin: 9.4 g/dL — ABNORMAL LOW (ref 12.0–15.0)
MCH: 28 pg (ref 26.0–34.0)
MCHC: 31.6 g/dL (ref 30.0–36.0)
MCV: 88.4 fL (ref 80.0–100.0)
Platelets: 399 10*3/uL (ref 150–400)
RBC: 3.36 MIL/uL — ABNORMAL LOW (ref 3.87–5.11)
RDW: 14.4 % (ref 11.5–15.5)
WBC: 14.4 10*3/uL — ABNORMAL HIGH (ref 4.0–10.5)
nRBC: 0 % (ref 0.0–0.2)

## 2024-03-07 LAB — RAD ONC ARIA SESSION SUMMARY
Course Elapsed Days: 1
Plan Fractions Treated to Date: 2
Plan Prescribed Dose Per Fraction: 4.4 Gy
Plan Total Fractions Prescribed: 5
Plan Total Prescribed Dose: 22 Gy
Reference Point Dosage Given to Date: 8.8 Gy
Reference Point Session Dosage Given: 4.4 Gy
Session Number: 2

## 2024-03-07 LAB — BASIC METABOLIC PANEL WITH GFR
Anion gap: 8 (ref 5–15)
BUN: 45 mg/dL — ABNORMAL HIGH (ref 8–23)
CO2: 27 mmol/L (ref 22–32)
Calcium: 9.2 mg/dL (ref 8.9–10.3)
Chloride: 95 mmol/L — ABNORMAL LOW (ref 98–111)
Creatinine, Ser: 0.88 mg/dL (ref 0.44–1.00)
GFR, Estimated: >60 mL/min (ref >60)
Glucose, Bld: 297 mg/dL — ABNORMAL HIGH (ref 70–99)
Potassium: 4.9 mmol/L (ref 3.5–5.1)
Sodium: 130 mmol/L — ABNORMAL LOW (ref 135–145)

## 2024-03-07 LAB — GLUCOSE, CAPILLARY
Glucose-Capillary: 254 mg/dL — ABNORMAL HIGH (ref 70–99)
Glucose-Capillary: 279 mg/dL — ABNORMAL HIGH (ref 70–99)
Glucose-Capillary: 292 mg/dL — ABNORMAL HIGH (ref 70–99)
Glucose-Capillary: 336 mg/dL — ABNORMAL HIGH (ref 70–99)
Glucose-Capillary: 347 mg/dL — ABNORMAL HIGH (ref 70–99)
Glucose-Capillary: 359 mg/dL — ABNORMAL HIGH (ref 70–99)

## 2024-03-07 LAB — URINE CULTURE: Culture: NO GROWTH

## 2024-03-07 LAB — MAGNESIUM: Magnesium: 2.2 mg/dL (ref 1.7–2.4)

## 2024-03-07 MED ORDER — INSULIN GLARGINE-YFGN 100 UNIT/ML ~~LOC~~ SOLN
34.0000 [IU] | Freq: Every day | SUBCUTANEOUS | Status: DC
  Administered 2024-03-07: 34 [IU] via SUBCUTANEOUS
  Filled 2024-03-07 (×2): qty 0.34

## 2024-03-07 MED ORDER — INSULIN ASPART 100 UNIT/ML IJ SOLN
8.0000 [IU] | INTRAMUSCULAR | Status: DC
  Administered 2024-03-07 – 2024-03-08 (×7): 8 [IU] via SUBCUTANEOUS

## 2024-03-07 NOTE — Consult Note (Signed)
 Consultation Note Date: 03/07/2024   Patient Name: Nancy Sutton  DOB: 12-20-1945  MRN: [id number]  Age / Sex: 78 y.o., female  PCP: [name], MD Referring Physician: Rosena Conradi, MD  Reason for Consultation:  goals of care  HPI/Patient Profile: 78 y.o. female  with past medical history of DM1- insulin  dependent, HTN,CAD, thyroid  mass admitted on 02/25/2024 with worsening neck pain and SOB. She underwent biopsy and had tracheostomy during this hospitalization. 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 She has started palliative radiation therapy to her neck. Palliative medicine consulted for GOC.    Primary Decision Maker PATIENT  Discussion: I have reviewed medical records including Care Everywhere, progress notes from this and prior admissions, labs and imaging, discussed with RN.  On evaluation patient is awake, alert, and oriented, but she tires easily.   Met with patient, spouse, daughter and son.   I introduced Palliative Medicine as specialized medical care for people living with serious illness. It focuses on providing relief from the symptoms and stress of a serious illness. The goal is to improve quality of life for both the patient and the family.  We discussed a brief life review of the patient. She finds joy in her children and her cats and her daughter's Chiweenies.   As far as functional and nutritional status prior to admission she was independent. She greatly valued her independent life and expresses frustration with her newly baseline dependent state.    We discussed patient's current illness and what it means in the larger context of patient's on-going co-morbidities.  Natural disease trajectory and expectations at EOL were discussed.  Advanced Care Planning- 30 mins I attempted to elicit  values and goals of care important to the patient. Her primary goal is to go home and remain at home. She wishes to die at home with comfort and dignity.    Erik is very clear that she is ready to die. She shares that she was ready to go to heaven yesterday.   The difference between aggressive medical intervention and comfort care was considered in light of the patient's goals of care.   Advance directives, concepts specific to code status, artificial feeding and hydration, and rehospitalization were considered and discussed. She does not want CPR, life support - or to be sustained with artificial feeding.   Discussed with patient/family the importance of continued conversation with family and the medical providers regarding overall plan of care and treatment options, ensuring decisions are within the context of the patient's values and GOCs.    Hospice and Palliative Care services outpatient were explained and offered.   Questions and concerns were addressed. The family was encouraged to call with questions or concerns.      SUMMARY OF RECOMMENDATIONS -New thyroid  anaplastic cancer- plan to complete radiation tx -Discharge home with hospice- TOC order placed -Code status changed to DNR    Code Status/Advance Care Planning:   Code Status: Limited:  Do not attempt resuscitation (DNR) -DNR-LIMITED -Do Not Intubate/DNI     Prognosis:   < 6 months  Discharge Planning: Home with Hospice  Primary Diagnoses: Present on Admission:  Neck mass  Uncontrolled type 1 diabetes mellitus with hyperglycemia (HCC)  Paroxysmal atrial fibrillation (HCC)  NSTEMI (non-ST elevated myocardial infarction) (HCC)  Hyperlipidemia  GERD (gastroesophageal reflux disease)  CAD (coronary artery disease)  Cerebral infarction (HCC)  Anaplastic thyroid  carcinoma (HCC)  Hoarseness  Thyroid  cancer (HCC)   Review of Systems  Constitutional:  Positive for activity change, appetite change and fatigue.     Physical Exam Vitals and nursing note reviewed.  Constitutional:      General: She is not in acute distress.    Appearance: She is ill-appearing.   Cardiovascular:     Rate and Rhythm: Normal rate.  Pulmonary:     Effort: Pulmonary effort is normal.   Neurological:     Mental Status: She is alert and oriented to person, place, and time.     Vital Signs: BP (!) 133/55 (BP Location: Right Arm)   Pulse 82   Temp 98.9 F (37.2 C) (Oral)   Resp 18   Ht 5' (1.524 m)   Wt 58.2 kg   SpO2 98%   BMI 25.06 kg/m  Pain Scale: 0-10 POSS *See Group Information*: 1-Acceptable,Awake and alert Pain Score: 0-No pain   SpO2: SpO2: 98 % O2 Device:SpO2: 98 % O2 Flow Rate: .O2 Flow Rate (L/min): 6 L/min  IO: Intake/output summary:  Intake/Output Summary (Last 24 hours) at 03/07/2024 1121 Last data filed at 03/06/2024 2325 Gross per 24 hour  Intake 4082.75 ml  Output --  Net 4082.75 ml    LBM: Last BM Date : 03/07/24 Baseline Weight: Weight: 54.4 kg Most recent weight: Weight:  (bed not zeroed out)       Thank you for this consult. Palliative medicine will continue to follow and assist as needed.   Signed by: Micki Alas, AGNP-C Palliative Medicine  Time includes:   Preparing to see the patient (e.g., review of tests) Obtaining and/or reviewing separately obtained history Performing a medically necessary appropriate examination and/or evaluation Counseling and educating the patient/family/caregiver Ordering medications, tests, or procedures Referring and communicating with other health care professionals (when not reported separately) Documenting clinical information in the electronic or other health record Independently interpreting results (not reported separately) and communicating results to the patient/family/caregiver Care coordination (not reported separately) Clinical documentation   Please contact Palliative Medicine Team phone at [phone] for questions and  concerns.  For individual provider: See Tilford Foley

## 2024-03-07 NOTE — Progress Notes (Signed)
 PT refused tracheal suctioning- had good cough. During visit while inner cannula was being changed PT coughed out a medium sized blood plug.

## 2024-03-07 NOTE — Progress Notes (Signed)
 PROGRESS NOTE  Nancy Sutton NFA:[id number] DOB: Jan 02, 1946 DOA: 02/25/2024 PCP: [name], MD   LOS: 11 days   Brief narrative:  78 y.o female with past medical history of stroke with residual left hemianopia, hypertension, diabetes, CAD status post stent with history of thyroid  mass being followed by general surgery with plans for surgical intervention presented to hospital with difficulty clearing her throat, and worsening neck pain.  Patient was transferred from [facility] to [facility] for evaluation by general surgery.  During hospitalization patient was seen by ENT. Patient was seen by Dr. Bobetta Burrows with plan for tracheostomy 6/11, and open thyroid  biopsy for definitive histologic diagnosis, medical oncology, radiation oncology has been involved as well, as well Dr. Teo in communication with ENT at [facility].  Pathology showed anaplastic thyroid  carcinoma.  Patient was then transferred to  [facility] for radiation.  Oncology and radiation oncology ENT and palliative care following    Assessment/Plan: Principal Problem:   Neck mass Active Problems:   Hyperlipidemia   GERD (gastroesophageal reflux disease)   Cerebral infarction (HCC)   Uncontrolled type 1 diabetes mellitus with hyperglycemia (HCC)   NSTEMI (non-ST elevated myocardial infarction) (HCC)   Paroxysmal atrial fibrillation (HCC)   CAD (coronary artery disease)   Anaplastic thyroid  carcinoma (HCC)   Hoarseness   Thyroid  cancer (HCC)   Tracheostomy status (HCC)   Anaplastic Thyroid  Carcinoma  s/p open thyroid  biopsy and tracheostomy -> changed to uncuffed tube per Dr. [name]. 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  CT scan of the abdomen chest and pelvis without any metastatic disease.  CT neck with irregular peripherally enhancing centrally hypodense lesion within the right thyroid  fossa with associated mild right cervical lymphadenopathy.  LN's centrally hypodense (suppurative or necrotic)  (concerning for metastatic thyroid  cancer - see report).  Patient was then transferred to [facility] long for possible radiation.   on cortrak tube for tube feeding.  Speech therapy has evaluated the patient and is on full liquids..  Poor prognosis with this cancer which is not chemosensitive and patient is not a surgical candidate.  Seen by ENT during hospitalization..  Further recommendations as per oncology and radiation oncology   Systemic Inflammatory Response Syndrome Leukocytosis present.  No fevers chest x-ray without any obvious infiltrate.  UA with positive white cells and leukocyte esterase.  Follow-up urine culture blood culture.  Currently on Levaquin Flagyl  given ampicillin allergy   Acute recurrent stroke History of stroke with left-sided hemianopsia.  MRI with patchy acute ischemic nonhemorrhagic right frontal, parietal, and occipital infarcts (watershed in distribution), chronic R PCA distribution infarct.  2D echo with normal EF.  Hemoglobin A1c of .9.  Seen by neurology during hospitalization.  Okay to start DAPT as per ENT.  Continue statin and dual antiplatelets.  Speech therapy has recommended nectar thick liquids.  Continue PT OT.  On tube feeding as well.   CAD s/p PCI in March 2024.  - Has seen her cardiologist and was cleared to undergo surgical procedure on 02/22/2024, continue Plavix , aspirin , statin.     Paroxysmal A-fib in 2024.  1 episode in the past.  This was in conjunction of her NSTEMI.  Not on anticoagulation, follows with her cardiologist, continue low-dose Cardizem .  Rate controlled at this time.   Dyslipidemia.  Continue statin    Hypomagnesemia.  Replaced.  Latest magnesium  of 2.2   Hypokalemia.  Replenished.  Latest potassium of 4.9   Essential hypertension.  On Cardizem .  Blood pressure  seems to be stable   DM type II with hyperglycemia. Latest hemoglobin A1c 12/2023 was 6.9, continue insulin  regimen.  Will increase the doses of sliding scale and fixed  insulin .  Currently every 4 hours.  Will get diabetic coordinator consulted   Glaucoma Continue timolol     Goals of Care   Palliative care on board.  Deconditioning, debility.  PT OT has recommended skilled nursing facility placement at this time.   DVT prophylaxis: heparin  injection 5,000 Units Start: 02/26/24 1400   Disposition: Likely to skilled nursing facility, uncertain time  Status is: Inpatient Remains inpatient appropriate because: Need for radiation treatment, pending clinical improvement, need for skilled nursing facility placement    Code Status:     Code Status: Full Code  Family Communication: Spoke with the patient's spouse and multiple family members at bedside.  Consultants: Medical oncology Radiation oncology Palliative care ENT  Procedures: Status post tracheostomy and open biopsy of right thyroid  cancer on 02/29/2024 by ENT.  Anti-infectives:  Levaquin and metronidazole   Anti-infectives (From admission, onward)    Start     Dose/Rate Route Frequency Ordered Stop   03/04/24 1415  levofloxacin (LEVAQUIN) tablet 750 mg        750 mg Oral Every 48 hours 03/04/24 1315     03/04/24 1400  metroNIDAZOLE  (FLAGYL ) tablet 500 mg        500 mg Oral Every 12 hours 03/04/24 1309     02/29/24 1000  clindamycin  (CLEOCIN ) IVPB 900 mg        900 mg 100 mL/hr over 30 Minutes Intravenous  Once 02/29/24 0955 02/29/24 1106   02/29/24 0957  clindamycin  (CLEOCIN ) 900 MG/50ML IVPB       Note to Pharmacy: Clydell Darnel E: cabinet override      02/29/24 0957 02/29/24 1111        Subjective: Today, patient was seen and examined at bedside.  Patient's family at bedside.  Patient was able to eat some today without much coughing.  Denies any nausea vomiting or abdominal pain.  Has had bowel movements.  Denies overt respiratory difficulty   Objective: Vitals:   03/07/24 0610 03/07/24 0805  BP: (!) 133/55   Pulse: 82   Resp: 18   Temp: 98.9 F (37.2 C)   SpO2: 100%  98%    Intake/Output Summary (Last 24 hours) at 03/07/2024 1116 Last data filed at 03/06/2024 2325 Gross per 24 hour  Intake 4082.75 ml  Output --  Net 4082.75 ml   Filed Weights   03/02/24 1410 03/03/24 0442  Weight: 53.7 kg 58.2 kg   Body mass index is 25.06 kg/m.   Physical Exam:  GENERAL: Patient is alert awake and oriented. Not in obvious distress.  Elderly female, Communicative, able to voice.  Cortrak tube tube in place. HENT: No scleral pallor or icterus. Pupils equally reactive to light. Oral mucosa is moistUpper respiratory noises noted. NECK: Thyroid  tumor status post tracheostomy, CHEST mild coarse breath sounds noted bilaterally.  Diminished breath sounds. CVS: S1 and S2 heard, no murmur. Regular rate and rhythm.  ABDOMEN: Soft, non-tender, bowel sounds are present. EXTREMITIES: No edema. CNS: Cranial nerves are intact.  Left-sided weakness from previous stroke. SKIN: warm and dry without rashes.  Data Review: I have personally reviewed the following laboratory data and studies,  CBC: Recent Labs  Lab 03/01/24 0400 03/02/24 0836 03/03/24 1019 03/04/24 0549 03/05/24 0435 03/06/24 0614 03/07/24 0543  WBC 16.0* 17.3* 13.3* 14.1* 15.8* 16.1* 14.4*  NEUTROABS 14.0*  13.5*  --  11.0* 11.5* 11.4*  --   HGB 9.5* 10.0* 9.9* 10.3* 10.1* 9.6* 9.4*  HCT 29.3* 30.8* 31.7* 31.8* 31.1* 30.7* 29.7*  MCV 86.2 85.1 86.6 85.3 85.7 88.5 88.4  PLT 350 423* 376 389 397 394 399   Basic Metabolic Panel: Recent Labs  Lab 03/01/24 0400 03/02/24 0836 03/03/24 1019 03/04/24 0549 03/05/24 0435 03/06/24 0614 03/07/24 0543  NA 136   < > 133* 132* 132* 135 130*  K 4.1   < > 4.0 4.5 4.4 4.2 4.9  CL 104   < > 101 99 98 99 95*  CO2 22   < > 23 26 25 25 27   GLUCOSE 227*   < > 326* 380* 377* 250* 297*  BUN 21   < > 27* 28* 36* 45* 45*  CREATININE 0.78   < > 0.90 0.91 1.01* 0.94 0.88  CALCIUM  8.9   < > 8.9 8.9 8.8* 9.3 9.2  MG 2.1  --   --  2.0 2.2 2.2 2.2  PHOS 3.7  --   --   2.2* 2.7 3.6  --    < > = values in this interval not displayed.   Liver Function Tests: Recent Labs  Lab 03/04/24 0549 03/05/24 0435 03/06/24 0614  AST 28 45* 46*  ALT 31 40 55*  ALKPHOS 74 87 79  BILITOT 0.4 0.2 0.2  PROT 5.9* 6.5 6.0*  ALBUMIN 2.3* 2.3* 2.5*   No results for input(s): LIPASE, AMYLASE in the last 168 hours. No results for input(s): AMMONIA in the last 168 hours. Cardiac Enzymes: No results for input(s): CKTOTAL, CKMB, CKMBINDEX, TROPONINI in the last 168 hours. BNP (last 3 results) Recent Labs    02/28/24 0513 02/29/24 0534 03/01/24 0400  BNP 77.9 136.2* 192.2*    ProBNP (last 3 results) No results for input(s): PROBNP in the last 8760 hours.  CBG: Recent Labs  Lab 03/06/24 1645 03/06/24 2024 03/06/24 2312 03/07/24 0613 03/07/24 0719  GLUCAP 259* 303* 290* 336* 347*   Recent Results (from the past 240 hours)  Culture, blood (Routine X 2) w Reflex to ID Panel     Status: None (Preliminary result)   Collection Time: 03/04/24  3:06 PM   Specimen: BLOOD RIGHT HAND  Result Value Ref Range Status   Specimen Description BLOOD RIGHT HAND  Final   Special Requests   Final    BOTTLES DRAWN AEROBIC ONLY Blood Culture results may not be optimal due to an inadequate volume of blood received in culture bottles   Culture   Final    NO GROWTH 3 DAYS Performed at [facility], [address] [place], [place] [address]    Report Status PENDING  Incomplete  Culture, blood (Routine X 2) w Reflex to ID Panel     Status: None (Preliminary result)   Collection Time: 03/04/24  3:13 PM   Specimen: BLOOD LEFT ARM  Result Value Ref Range Status   Specimen Description BLOOD LEFT ARM  Final   Special Requests   Final    BOTTLES DRAWN AEROBIC AND ANAEROBIC Blood Culture adequate volume   Culture   Final    NO GROWTH 3 DAYS Performed at [facility], [address] [place], [place] [address]    Report Status PENDING  Incomplete      Studies: No results found.    Ignacio Lowder, MD  [facility] 03/07/2024  If 7PM-7AM, please contact night-coverage

## 2024-03-07 NOTE — Progress Notes (Signed)
 Called to PT room to suction trach. PT BS diminished,clear- Sp02 98%. PT stated (PMV in place) that she was having some trouble breathing. When RT removed PMV it sounded as though PT had trapped air. Once PMV removed PT confirmed that she was breathing better (no suctioning needed). RT placed PMV in assigned purple container and spoke with RN (at bedside) to notify speech of possible air trapping- she agreed to inform them. Encouraged staff and husband at bedside not to utilize PMV until Speech further evaluates. PT does have a neck mass with # 6 uncuffed trach.

## 2024-03-07 NOTE — Plan of Care (Signed)
  Problem: Education: Goal: Ability to describe self-care measures that may prevent or decrease complications (Diabetes Survival Skills Education) will improve Outcome: Progressing Goal: Individualized Educational Video(s) Outcome: Progressing   Problem: Coping: Goal: Ability to adjust to condition or change in health will improve Outcome: Progressing   Problem: Health Behavior/Discharge Planning: Goal: Ability to identify and utilize available resources and services will improve Outcome: Progressing Goal: Ability to manage health-related needs will improve Outcome: Progressing   Problem: Metabolic: Goal: Ability to maintain appropriate glucose levels will improve Outcome: Progressing   Problem: Nutritional: Goal: Maintenance of adequate nutrition will improve Outcome: Progressing Goal: Progress toward achieving an optimal weight will improve Outcome: Progressing

## 2024-03-07 NOTE — Progress Notes (Signed)
 Occupational Therapy Treatment Patient Details Name: Nancy Sutton MRN: [id number] DOB: 11-17-1945 Today's Date: 03/07/2024   History of present illness 78 yo female presents to [facility] 6/7 with SOB. 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 S/p trach 6/10. 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 PMH includes CVA with residual L hemianopsia, HTN, DMII, CAD s/p NSTEMI and stenting 2024, OA.   OT comments  Nurse reported family requested therapy to check on patient to assist with positioning in bed to reduce pain. Patient and family were educated on positioning to increase comfort and reduce risks of skin breakdown. Patient will need 24/7 caregiver support in next level of care. GOC meeting still pending at this time. OT to follow from distance to see if there are any further OT needs.       If plan is discharge home, recommend the following:  Two people to help with walking and/or transfers;Two people to help with bathing/dressing/bathroom;Assistance with cooking/housework;Assistance with feeding;Direct supervision/assist for medications management;Direct supervision/assist for financial management;Assist for transportation;Help with stairs or ramp for entrance;Supervision due to cognitive status   Equipment Recommendations  None recommended by OT       Precautions / Restrictions Precautions Precautions: Fall Recall of Precautions/Restrictions: Impaired Precaution/Restrictions Comments: NG tube, trach with PMSV Restrictions Weight Bearing Restrictions Per Provider Order: No       Mobility Bed Mobility Overal bed mobility: Needs Assistance             General bed mobility comments: max A for positioning in bed with education provided to family.           ADL either performed or assessed with clinical judgement   ADL  Overall ADL's : Needs assistance/impaired                                       General ADL Comments: patient and family in room with patient reporting having discomfort in neck with patient noted to have lateral tilt at cervical level to R side and kyphotic posture with no support for lower cervical area. patient was porivded with bone shape pillow to support lower cervical area in addition to normal head pillow above bone pillow with towl half rolled under R side to head to promote upright posture. patients family was educated on positioning for head, BUE and BLE to increase comfort and reduce risks of skin breakdown. patients family members in room verbalized understanding.    Extremity/Trunk Assessment              Vision       Perception     Praxis     Communication     Cognition Arousal: Alert Behavior During Therapy: WFL for tasks assessed/performed               OT - Cognition Comments: patient was plesnat and cooperative, various visitors in room with patient.                          Cueing      Exercises      Shoulder Instructions       General Comments      Pertinent Vitals/ Pain       Pain Assessment  Pain Assessment: Faces Faces Pain Scale: Hurts a little bit Pain Location: discomfort on R side of neck from positioning in bed. Pain Descriptors / Indicators: Constant, Grimacing Pain Intervention(s): Limited activity within patient's tolerance, Repositioned, Monitored during session  Home Living                                          Prior Functioning/Environment              Frequency  Min 1X/week        Progress Toward Goals  OT Goals(current goals can now be found in the care plan section)  Progress towards OT goals: OT to reassess next treatment (patient notes from pallative indicate hospice care transition in the works)     Plan      Co-evaluation                  AM-PAC OT 6 Clicks Daily Activity     Outcome Measure   Help from another person eating meals?: Total Help from another person taking care of personal grooming?: A Lot Help from another person toileting, which includes using toliet, bedpan, or urinal?: Total Help from another person bathing (including washing, rinsing, drying)?: Total Help from another person to put on and taking off regular upper body clothing?: A Lot Help from another person to put on and taking off regular lower body clothing?: Total 6 Click Score: 8    End of Session    OT Visit Diagnosis: Unsteadiness on feet (R26.81);Repeated falls (R29.6);Muscle weakness (generalized) (M62.81);Feeding difficulties (R63.3);Low vision, both eyes (H54.2)   Activity Tolerance Patient tolerated treatment well;Patient limited by pain   Patient Left in bed;with call bell/phone within reach;with bed alarm set   Nurse Communication Other (comment) (positioning in bed and familys desire fo flexiseal to be placed.)        Time: 6387-5643 OT Time Calculation (min): 12 min  Charges: OT General Charges $OT Visit: 1 Visit OT Treatments $Therapeutic Activity: 8-22 mins  Wynette Heckler, MS Acute Rehabilitation Department Office# [phone]   Jame Maze 03/07/2024, 4:09 PM

## 2024-03-08 ENCOUNTER — Ambulatory Visit
Admit: 2024-03-08 | Discharge: 2024-03-08 | Disposition: A | Discharge status: Home / Self Care | Attending: Radiation Oncology | Admitting: Radiation Oncology

## 2024-03-08 ENCOUNTER — Other Ambulatory Visit: Payer: Self-pay

## 2024-03-08 DIAGNOSIS — Z7189 Other specified counseling: Secondary | ICD-10-CM | POA: Diagnosis not present

## 2024-03-08 DIAGNOSIS — E079 Disorder of thyroid, unspecified: Secondary | ICD-10-CM | POA: Diagnosis not present

## 2024-03-08 DIAGNOSIS — Z930 Tracheostomy status: Secondary | ICD-10-CM | POA: Diagnosis not present

## 2024-03-08 DIAGNOSIS — C73 Malignant neoplasm of thyroid gland: Secondary | ICD-10-CM | POA: Diagnosis not present

## 2024-03-08 DIAGNOSIS — Z515 Encounter for palliative care: Secondary | ICD-10-CM

## 2024-03-08 DIAGNOSIS — R490 Dysphonia: Secondary | ICD-10-CM | POA: Diagnosis not present

## 2024-03-08 DIAGNOSIS — Z510 Encounter for antineoplastic radiation therapy: Secondary | ICD-10-CM | POA: Diagnosis not present

## 2024-03-08 LAB — RAD ONC ARIA SESSION SUMMARY
Course Elapsed Days: 2
Plan Fractions Treated to Date: 3
Plan Prescribed Dose Per Fraction: 4.4 Gy
Plan Total Fractions Prescribed: 5
Plan Total Prescribed Dose: 22 Gy
Reference Point Dosage Given to Date: 13.2 Gy
Reference Point Session Dosage Given: 4.4 Gy
Session Number: 3

## 2024-03-08 LAB — GLUCOSE, CAPILLARY
Glucose-Capillary: 259 mg/dL — ABNORMAL HIGH (ref 70–99)
Glucose-Capillary: 208 mg/dL — ABNORMAL HIGH (ref 70–99)
Glucose-Capillary: 262 mg/dL — ABNORMAL HIGH (ref 70–99)
Glucose-Capillary: 125 mg/dL — ABNORMAL HIGH (ref 70–99)
Glucose-Capillary: 322 mg/dL — ABNORMAL HIGH (ref 70–99)

## 2024-03-08 MED ORDER — GLUCERNA 1.5 CAL PO LIQD
1000.0000 mL | ORAL | Status: DC
  Administered 2024-03-08 – 2024-03-11 (×3): 1000 mL
  Filled 2024-03-08 (×8): qty 1000

## 2024-03-08 MED ORDER — INSULIN GLARGINE-YFGN 100 UNIT/ML ~~LOC~~ SOLN
36.0000 [IU] | Freq: Every day | SUBCUTANEOUS | Status: DC
  Administered 2024-03-08 – 2024-03-13 (×6): 36 [IU] via SUBCUTANEOUS
  Filled 2024-03-08 (×6): qty 0.36

## 2024-03-08 MED ORDER — INSULIN ASPART 100 UNIT/ML IJ SOLN
10.0000 [IU] | INTRAMUSCULAR | Status: DC
  Administered 2024-03-08 – 2024-03-09 (×3): 10 [IU] via SUBCUTANEOUS

## 2024-03-08 NOTE — Progress Notes (Addendum)
 PROGRESS NOTE  Nancy Sutton:[id number] DOB: 02/22/1946 DOA: 02/25/2024 PCP: [name], MD   LOS: 12 days   Brief narrative:  78 y.o female with past medical history of stroke with residual left hemianopia, hypertension, diabetes, CAD status post stent with history of thyroid  mass being followed by general surgery with plans for surgical intervention presented to hospital with difficulty clearing her throat, and worsening neck pain.  Patient was transferred from [facility] to [facility] for evaluation by general surgery.  During hospitalization, patient was seen by ENT. Patient was seen by Dr. Bobetta Burrows with plan for tracheostomy 6/11, and open thyroid  biopsy for definitive histologic diagnosis, medical oncology, radiation oncology has been involved as well, as well Dr. Teo in communication with ENT at [facility].  Pathology showed anaplastic thyroid  carcinoma.  Patient was then transferred to  [facility] for radiation.  At this time patient is undergoing radiation treatment and palliative care is following for goals of care discussion.    Assessment/Plan: Principal Problem:   Neck mass Active Problems:   Hyperlipidemia   GERD (gastroesophageal reflux disease)   Cerebral infarction (HCC)   Uncontrolled type 1 diabetes mellitus with hyperglycemia (HCC)   NSTEMI (non-ST elevated myocardial infarction) (HCC)   Paroxysmal atrial fibrillation (HCC)   CAD (coronary artery disease)   Anaplastic thyroid  carcinoma (HCC)   Hoarseness   Thyroid  cancer (HCC)   Tracheostomy status (HCC)   Anaplastic Thyroid  Carcinoma  s/p open thyroid  biopsy and tracheostomy -> changed to uncuffed tube.  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   Seen by ENT during hospitalization.. CT scan of the abdomen chest and pelvis without any metastatic disease.  CT neck with irregular peripherally enhancing centrally hypodense lesion within the right thyroid  fossa with associated mild right  cervical lymphadenopathy,  Patient was then transferred to [facility] for radiation treatment, on cortrak tube for tube feeding.  Speech therapy has evaluated the patient and is on full liquids as well...  Poor prognosis with this cancer which is not chemosensitive and patient is not a surgical candidate.  Currently undergoing radiation treatment.  Possible transition to hospice level of care after radiation treatment is done.    Systemic Inflammatory Response Syndrome Leukocytosis without fever. chest x-ray without any obvious infiltrate.  UA with positive white cells and leukocyte esterase.  Cultures negative in 4 days.  Urine culture with no growth.  Currently on Levaquin Flagyl  given ampicillin allergy.  No obvious source of infection so we will discontinue antibiotics.   Acute recurrent stroke History of stroke with left-sided hemianopsia.  MRI of the brain with patchy acute ischemic nonhemorrhagic right frontal, parietal, and occipital infarcts (watershed in distribution), chronic R PCA distribution infarct.  2D echo with normal EF.  Hemoglobin A1c of 6.9.  Seen by neurology during hospitalization.  Currently on statin and dual antiplatelets.  Speech therapy has recommended full liquids.  Seen by OT.   CAD s/p PCI in March 2024.  - Has seen her cardiologist and was cleared to undergo surgical procedure on 02/22/2024, continue Plavix , aspirin , statin.     Paroxysmal A-fib in 2024.  Had 1 episode in the past.  Rate controlled at this time.  Not on anticoagulation, not recommended by cardiology.  Continue Cardizem    Dyslipidemia.  Continue statin    Hypomagnesemia.  Replaced.  Latest magnesium  of 2.2   Hypokalemia.  Replenished.  Latest potassium of 4.9   Essential hypertension.  On Cardizem .  Blood pressure seems  to be stable   DM type II with hyperglycemia.  Blood glucose levels more than 200 several times.  On tube feeding.  Latest hemoglobin A1c 12/2023 was 6.9, continue insulin  regimen.   Currently on every 4 hour regimen.  Consult diabetic coordinator.  Will increase the dose of Semglee  in NovoLog  today.   Glaucoma Continue timolol     Goals of Care   Palliative care on board.  Deconditioning, debility.  PT OT has recommended skilled nursing facility but likely go home with hospice at this time after completion of radiation treatment   DVT prophylaxis: heparin  injection 5,000 Units Start: 02/26/24 1400   Disposition: Likely home with hospice  Status is: Inpatient Remains inpatient appropriate because: Need for radiation treatment likely until Monday., pending clinical improvement, plan for hospice after radiation treatment.    Code Status:     Code Status: Limited: Do not attempt resuscitation (DNR) -DNR-LIMITED -Do Not Intubate/DNI   Family Communication: Spoke with the patient's spouse at the bedside on 03/09/2019  Consultants: Medical oncology Radiation oncology Palliative care ENT  Procedures: Status post tracheostomy and open biopsy of right thyroid  cancer on 02/29/2024 by ENT.  Anti-infectives:  Levaquin and metronidazole -will discontinue  Anti-infectives (From admission, onward)    Start     Dose/Rate Route Frequency Ordered Stop   03/04/24 1415  levofloxacin (LEVAQUIN) tablet 750 mg  Status:  Discontinued        750 mg Oral Every 48 hours 03/04/24 1315 03/08/24 1316   03/04/24 1400  metroNIDAZOLE  (FLAGYL ) tablet 500 mg  Status:  Discontinued        500 mg Oral Every 12 hours 03/04/24 1309 03/08/24 1316   02/29/24 1000  clindamycin  (CLEOCIN ) IVPB 900 mg        900 mg 100 mL/hr over 30 Minutes Intravenous  Once 02/29/24 0955 02/29/24 1106   02/29/24 0957  clindamycin  (CLEOCIN ) 900 MG/50ML IVPB       Note to Pharmacy: Clydell Darnel E: cabinet override      02/29/24 0957 02/29/24 1111        Subjective: Today, patient was seen and examined at bedside.  Patient husband at bedside.  Stated that she could not sleep well in the nighttime sleeping  some.  Denies any dyspnea, pain, nausea, vomiting.    Objective: Vitals:   03/08/24 1030 03/08/24 1235  BP: 100/82   Pulse: 99   Resp:    Temp:    SpO2:  96%    Intake/Output Summary (Last 24 hours) at 03/08/2024 1321 Last data filed at 03/08/2024 0800 Gross per 24 hour  Intake 45 ml  Output 750 ml  Net -705 ml   Filed Weights   03/02/24 1410 03/03/24 0442  Weight: 53.7 kg 58.2 kg   Body mass index is 25.06 kg/m.   Physical Exam:  GENERAL: Patient is alert awake on verbal command but is slightly somnolent.. Not in obvious distress.  Elderly female, Communicative, able to vocalize.  Cortrak tube in place. HENT: No scleral pallor or icterus. Pupils equally reactive to light. Tracheostomy in place. NECK: Tracheostomy in place. CHEST coarse breath sounds noted bilaterally.  Diminished breath sounds. CVS: S1 and S2 heard, no murmur. Regular rate and rhythm.  ABDOMEN: Soft, non-tender, bowel sounds are present. EXTREMITIES: No edema. CNS: Cranial nerves are intact.  Left-sided weakness from previous stroke. SKIN: warm and dry without rashes.  Data Review: I have personally reviewed the following laboratory data and studies,  CBC: Recent Labs  Lab 03/02/24  0981 03/03/24 1019 03/04/24 0549 03/05/24 0435 03/06/24 0614 03/07/24 0543  WBC 17.3* 13.3* 14.1* 15.8* 16.1* 14.4*  NEUTROABS 13.5*  --  11.0* 11.5* 11.4*  --   HGB 10.0* 9.9* 10.3* 10.1* 9.6* 9.4*  HCT 30.8* 31.7* 31.8* 31.1* 30.7* 29.7*  MCV 85.1 86.6 85.3 85.7 88.5 88.4  PLT 423* 376 389 397 394 399   Basic Metabolic Panel: Recent Labs  Lab 03/03/24 1019 03/04/24 0549 03/05/24 0435 03/06/24 0614 03/07/24 0543  NA 133* 132* 132* 135 130*  K 4.0 4.5 4.4 4.2 4.9  CL 101 99 98 99 95*  CO2 23 26 25 25 27   GLUCOSE 326* 380* 377* 250* 297*  BUN 27* 28* 36* 45* 45*  CREATININE 0.90 0.91 1.01* 0.94 0.88  CALCIUM  8.9 8.9 8.8* 9.3 9.2  MG  --  2.0 2.2 2.2 2.2  PHOS  --  2.2* 2.7 3.6  --    Liver Function  Tests: Recent Labs  Lab 03/04/24 0549 03/05/24 0435 03/06/24 0614  AST 28 45* 46*  ALT 31 40 55*  ALKPHOS 74 87 79  BILITOT 0.4 0.2 0.2  PROT 5.9* 6.5 6.0*  ALBUMIN 2.3* 2.3* 2.5*   No results for input(s): LIPASE, AMYLASE in the last 168 hours. No results for input(s): AMMONIA in the last 168 hours. Cardiac Enzymes: No results for input(s): CKTOTAL, CKMB, CKMBINDEX, TROPONINI in the last 168 hours. BNP (last 3 results) Recent Labs    02/28/24 0513 02/29/24 0534 03/01/24 0400  BNP 77.9 136.2* 192.2*    ProBNP (last 3 results) No results for input(s): PROBNP in the last 8760 hours.  CBG: Recent Labs  Lab 03/07/24 2034 03/07/24 2351 03/08/24 0507 03/08/24 0741 03/08/24 1124  GLUCAP 279* 359* 259* 208* 262*   Recent Results (from the past 240 hours)  Culture, blood (Routine X 2) w Reflex to ID Panel     Status: None (Preliminary result)   Collection Time: 03/04/24  3:06 PM   Specimen: BLOOD RIGHT HAND  Result Value Ref Range Status   Specimen Description BLOOD RIGHT HAND  Final   Special Requests   Final    BOTTLES DRAWN AEROBIC ONLY Blood Culture results may not be optimal due to an inadequate volume of blood received in culture bottles   Culture   Final    NO GROWTH 4 DAYS Performed at [facility], [address], [place], [place] [address]    Report Status PENDING  Incomplete  Culture, blood (Routine X 2) w Reflex to ID Panel     Status: None (Preliminary result)   Collection Time: 03/04/24  3:13 PM   Specimen: BLOOD LEFT ARM  Result Value Ref Range Status   Specimen Description BLOOD LEFT ARM  Final   Special Requests   Final    BOTTLES DRAWN AEROBIC AND ANAEROBIC Blood Culture adequate volume   Culture   Final    NO GROWTH 4 DAYS Performed at [facility], [address] [place], [place] [address]    Report Status PENDING  Incomplete  Urine Culture (for pregnant, neutropenic or urologic patients or patients with an  indwelling urinary catheter)     Status: None   Collection Time: 03/05/24  8:59 PM   Specimen: Urine, Clean Catch  Result Value Ref Range Status   Specimen Description   Final    URINE, CLEAN CATCH Performed at [facility], [address] [place], [place] [address]    Special Requests  Final    NONE Performed at [facility], [address], [place], [place] [address]    Culture   Final    NO GROWTH Performed at [facility], [address], [place], [place] [address]    Report Status 03/07/2024 FINAL  Final     Studies: No results found.    Imaad Reuss, MD  [facility] 03/08/2024  If 7PM-7AM, please contact night-coverage

## 2024-03-08 NOTE — Progress Notes (Signed)
 SLP Cancellation Note  Patient Details Name: Nancy Sutton MRN: [id number] DOB: Jan 06, 1946   Cancelled treatment:       Reason Eval/Treat Not Completed: Other (comment);Fatigue/lethargy limiting ability to participate (Pt lethargic at this time; RN in room providing meds via Tube, spouse, [name], present and reports pt had a rough night. Did not sleep well - only sleeping for approx one hour.  Requested RN informed SLP when/if pt awakens adequately for dysphagia management and PMSV.  RN, Leward Record stated I'll try.  Advised to time restraints today with meeting 1130-1 at [facility] and OP MBS @1  pm and pt having XRT at approximately 1430.  Will make every effort to return ASAP.     Touched base with [name], NP, from palliative team via secure chat to receive clarification re: diet - [name] requested SLP follow up with family/pt regarding their desires for diet. In addition, [name], RT, requested SLP follow up today with pt re: PMSV use as pt with significant breathstacking and mucus plug yesterday.  )  Maudie Sorrow, MS [facility] SLP Acute Rehab Services Office [phone]  Chantal Comment 03/08/2024, 9:05 AM

## 2024-03-08 NOTE — Progress Notes (Signed)
 Physical Therapy Treatment Patient Details Name: Nancy Sutton MRN: [id number] DOB: 12-23-1945 Today's Date: 03/08/2024   History of Present Illness 78 yo female presents to [facility] 6/7 with SOB. 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 S/p trach 6/10. 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 PMH includes CVA with residual L hemianopsia, HTN, DMII, CAD s/p NSTEMI and stenting 2024, OA.    PT Comments  Max assist for bed mobility. Pt sat edge of bed ~5 minutes with very flexed kyphotic posture and min to mod assist for balance 2* multi-directional loss of balance. Pt performed seated weight shifting to L and R and RLE long arc quads.  PT session brief today as transporter arrived to take her to radiation.    If plan is discharge home, recommend the following: Two people to help with walking and/or transfers;Two people to help with bathing/dressing/bathroom;Assistance with feeding;Assistance with cooking/housework;Direct supervision/assist for medications management;Assist for transportation;Supervision due to cognitive status   Can travel by private vehicle     No  Equipment Recommendations  Wheelchair (measurements PT);Hospital bed;Hoyer lift;BSC/3in1;Wheelchair cushion (measurements PT);Rolling walker (2 wheels)    Recommendations for Other Services       Precautions / Restrictions Precautions Precautions: Fall Recall of Precautions/Restrictions: Impaired Precaution/Restrictions Comments: NG tube, trach with PMSV Restrictions Weight Bearing Restrictions Per Provider Order: No     Mobility  Bed Mobility Overal bed mobility: Needs Assistance Bed Mobility: Rolling, Supine to Sit Rolling: Max assist, Total assist   Supine to sit: Max assist, HOB elevated, Used rails     General bed mobility comments: max A for pivoting hips  and raiseing trunk. Pt able to advance RLE and reach for rail with RUE.    Transfers                        Ambulation/Gait                   Stairs             Wheelchair Mobility     Tilt Bed    Modified Rankin (Stroke Patients Only)       Balance Overall balance assessment: Needs assistance Sitting-balance support: Feet supported, Bilateral upper extremity supported Sitting balance-Leahy Scale: Poor Sitting balance - Comments: very flexed kyphotic posture, pt unable to extend trunk to neutral. Multidirectional  Loss of balance when pt didn't have BUE support. VCs for posture. Pt performed L/R weight shifts. Min to Mod assist for sitting balance with single UE support. Postural control: Posterior lean, Right lateral lean, Left lateral lean                                  Communication Communication Communication: Impaired Factors Affecting Communication: Trach/intubated (shakes head for yes/no; did not verbalize)  Cognition Arousal: Alert Behavior During Therapy: WFL for tasks assessed/performed                           PT - Cognition Comments: Follows questions/cuing throughout session with extra time, decreased attention to L, head resting in R cervical rotation. Following commands: Impaired Following commands impaired: Follows one step commands with increased time    Cueing Cueing Techniques: Verbal cues, Gestural  cues, Tactile cues, Visual cues  Exercises General Exercises - Lower Extremity Long Arc Quad: AROM, Right, 10 reps, Seated    General Comments        Pertinent Vitals/Pain Pain Assessment Pain Assessment: No/denies pain Faces Pain Scale: No hurt    Home Living                          Prior Function            PT Goals (current goals can now be found in the care plan section) Acute Rehab PT Goals Patient Stated Goal: Patient unable to state goal. Son reported that patient has  been wanting to sit up on side of bed. PT Goal Formulation: With patient/family Time For Goal Achievement: 03/17/24 Potential to Achieve Goals: Fair Progress towards PT goals: Progressing toward goals    Frequency    Min 2X/week      PT Plan      Co-evaluation              AM-PAC PT 6 Clicks Mobility   Outcome Measure  Help needed turning from your back to your side while in a flat bed without using bedrails?: A Lot Help needed moving from lying on your back to sitting on the side of a flat bed without using bedrails?: Total Help needed moving to and from a bed to a chair (including a wheelchair)?: Total Help needed standing up from a chair using your arms (e.g., wheelchair or bedside chair)?: Total Help needed to walk in hospital room?: Total Help needed climbing 3-5 steps with a railing? : Total 6 Click Score: 7    End of Session   Activity Tolerance: Patient tolerated treatment well;Patient limited by fatigue Patient left: in bed;with call bell/phone within reach;with bed alarm set;with family/visitor present;with nursing/sitter in room Nurse Communication: Mobility status PT Visit Diagnosis: Muscle weakness (generalized) (M62.81);Hemiplegia and hemiparesis;Difficulty in walking, not elsewhere classified (R26.2);Other abnormalities of gait and mobility (R26.89) Hemiplegia - Right/Left: Left Hemiplegia - caused by: Cerebral infarction     Time: 7564-3329 PT Time Calculation (min) (ACUTE ONLY): 17 min  Charges:    $Therapeutic Activity: 8-22 mins PT General Charges $$ ACUTE PT VISIT: 1 Visit                     Lynann Sandman Kistler PT 03/08/2024  Acute Rehabilitation Services  Office [phone]

## 2024-03-08 NOTE — Plan of Care (Signed)
   Problem: Coping: Goal: Ability to adjust to condition or change in health will improve Outcome: Progressing

## 2024-03-08 NOTE — Progress Notes (Signed)
 Nutrition Follow-up  INTERVENTION:   -Switch to Glucerna 1.5 @ 45 ml/hr via Cortrak tube -Provides 1620 kcals, 89g protein and 819 ml H2O -Continue free water  of 100 ml every 4 hours (600 ml) -Multivitamin with minerals daily per tube  -Monitor for GOC decisions  NUTRITION DIAGNOSIS:   Inadequate oral intake related to dysphagia as evidenced by NPO status.  Now on full liquids  GOAL:   Patient will meet greater than or equal to 90% of their needs  Meeting with TF  MONITOR:   Labs, Weight trends, TF tolerance, I & O's, Skin  REASON FOR ASSESSMENT:   Consult Assessment of nutrition requirement/status  ASSESSMENT:   78 y/o female with h/o HLD, GERD, stroke, NSTEMI, PAF, CAD, HTN, Barrett's esophagus with esophageal dysphagia s/p dilation who is admitted with thyroid  carcinoma s/p tracheostomy and NGT placement 6/11.  6/11: NGT placed, tube feeds started  6/12: Dysphagia 2, nectar thick liquids  6/13: NGT replaced for Cortrak, NPO 6/16: Starting on nectar thick full liquid diet  Spoke with pt's husband at bedside, pt in bed sleeping, very lethargic. Per husband, pt did not sleep much last night and now is making up for that. They have some liquid options for her if she wakes up to eat.  Continues to receive Osmolite 1.5 @ 45 ml/hr.  MD consulted RD regarding pt's continuously elevated CBGs. Will switch to more carb controlled formula to see if this helps. Husband agreeable.  Per husband,  main goal is to get pt home before she passes. Aware of poor prognosis.   Admission weight: 120 lbs Current weight: 128 lbs  Medications: Pepcid , Multivitamin with minerals daily, Miralax   Labs reviewed: CBGs: 208-359   Diet Order:   Diet Order             Diet full liquid Room service appropriate? Yes; Fluid consistency: Nectar Thick  Diet effective now                   EDUCATION NEEDS:   Education needs have been addressed  Skin:  Skin Assessment: Reviewed RN  Assessment (Incision neck)  Last BM:  6/18 -type 6 -rectal tube  Height:   Ht Readings from Last 1 Encounters:  03/02/24 5' (1.524 m)    Weight:   Wt Readings from Last 1 Encounters:  01/03/24 56.3 kg    BMI:  Body mass index is 25.06 kg/m.  Estimated Nutritional Needs:   Kcal:  1600-1800kcal/day  Protein:  80-90g/day  Fluid:  1.4-1.6L/day   Arna Better, MS, RD, LDN Inpatient Clinical Dietitian Contact via Secure chat

## 2024-03-08 NOTE — Inpatient Diabetes Management (Signed)
 Inpatient Diabetes Program Recommendations  AACE/ADA: New Consensus Statement on Inpatient Glycemic Control (2015)  Target Ranges:  Prepandial:   less than 140 mg/dL      Peak postprandial:   less than 180 mg/dL (1-2 hours)      Critically ill patients:  140 - 180 mg/dL   Lab Results  Component Value Date   GLUCAP 208 (H) 03/08/2024   HGBA1C 6.9 (A) 01/03/2024    Review of Glycemic Control  Diabetes history: DM2 Outpatient Diabetes medications: Trulicity  1.5 mg weekly, Tresiba  30 at bedtime, metformin  500 daily Current orders for Inpatient glycemic control: Semglee  34 units at bedtime, Novolog  0-20 Q4H + 8 units Q4H  TF - Osmolite 1.5 at 45/H  Inpatient Diabetes Program Recommendations:    Consider:  Increase Semglee  to 35 units at bedtime  Increase Novolog  to 10 units Q4H  Will continue to follow glucose trends.  Thank you. Joni Net, RD, LDN, CDCES Inpatient Diabetes Coordinator [phone]

## 2024-03-08 NOTE — TOC Progression Note (Signed)
 Transition of Care [facility]) - Progression Note    Patient Details  Name: Nancy Sutton MRN: [id number] Date of Birth: 04-03-1946  Transition of Care [facility]) CM/SW Contact  Loreda Rodriguez, RN Phone Number:[phone]  03/08/2024, 10:14 AM  Clinical Narrative:    [facility] acknowledges consult for [facility]. CM at bedside to discuss referral with husband. Husband agrees that he wants [employer] to support wife at home. Referral has been called to [place] at [facility]. TOC will continue to follow.         Expected Discharge Plan and Services                                               Social Determinants of Health (SDOH) Interventions SDOH Screenings   Food Insecurity: No Food Insecurity (02/25/2024)  Housing: Low Risk  (02/25/2024)  Transportation Needs: No Transportation Needs (02/25/2024)  Utilities: Not At Risk (02/25/2024)  Social Connections: Unknown (02/25/2024)  Tobacco Use: Low Risk  (02/29/2024)  Recent Concern: Tobacco Use - Medium Risk (02/21/2024)   Received from [facility]    Readmission Risk Interventions     No data to display

## 2024-03-08 NOTE — Progress Notes (Signed)
 Palliative Medicine Progress Note   Patient Name: Nancy Sutton       Date: 03/08/2024 DOB: [birth year]  Age: 78 y.o. MRN#: [id number] Attending Physician: Sonjia Held, MD Primary Care Physician: Bertell Satterfield, MD Admit Date: 02/25/2024   HPI/Patient Profile: 78 y.o. female  with past medical history of DM1- insulin  dependent, HTN,CAD, thyroid  mass admitted on 02/25/2024 with worsening neck pain and SOB. She underwent biopsy and had tracheostomy during this hospitalization. 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 She has started palliative radiation therapy to her neck. Palliative medicine consulted for GOC.   Subjective: Chart reviewed. Updates received. Patient assessed at bedside. She is sleeping soundly/comfortably and I did not attempt to wake her.   I spoke with son at bedside. We reviewed plan to discharge home with hospice after completion of palliative radiation (last treatment is Monday 6/23). He is working with [facility] and [facility] hospice liaison to obtain DME. He asks about artifical feeding, so I explained that patient cannot go home with a cortrak. We discussed the natural trajectory at end-of-life as well as the concept of comfort feeds.   Questions and concerns were addressed. Emotional support provided. Son expresses appreciation for ongoing palliative support.   Objective:  Physical Exam Vitals reviewed.  Constitutional:      General: She is sleeping. She is not in acute distress.    Appearance: She is ill-appearing.  HENT:     Head:     Comments: Cortrak in place Pulmonary:     Effort: Pulmonary effort is normal.     Comments: Trach collar    Palliative Medicine Assessment & Plan    Assessment: Principal Problem:   Neck mass Active Problems:   Hyperlipidemia   GERD (gastroesophageal reflux disease)   Cerebral infarction (HCC)   Uncontrolled type 1 diabetes mellitus with hyperglycemia (HCC)   NSTEMI (non-ST elevated myocardial infarction) (HCC)   Paroxysmal atrial fibrillation (HCC)   CAD (coronary artery disease)   Anaplastic thyroid  carcinoma (HCC)   Hoarseness   Thyroid  cancer (HCC)   Tracheostomy status (HCC)    Recommendations/Plan: Continue current supportive interventions  Plan to complete radiation treatment  Discharge home with hospice PMT will continue to follow and support  Code Status: DNR - Limited  Prognosis:  < 6 months  Discharge Planning: Home with Hospice   Thank you for allowing the Palliative Medicine Team to assist in the care of this patient.   MDM - moderate   Recardo KATHEE Loll, NP   Please contact Palliative Medicine Team phone at [phone] for questions and concerns.  For individual providers, please see AMION.

## 2024-03-08 NOTE — Plan of Care (Signed)

## 2024-03-09 ENCOUNTER — Ambulatory Visit
Admit: 2024-03-09 | Discharge: 2024-03-09 | Disposition: A | Discharge status: Home / Self Care | Attending: Radiation Oncology | Admitting: Radiation Oncology

## 2024-03-09 ENCOUNTER — Other Ambulatory Visit: Payer: Self-pay

## 2024-03-09 DIAGNOSIS — I639 Cerebral infarction, unspecified: Secondary | ICD-10-CM

## 2024-03-09 DIAGNOSIS — I214 Non-ST elevation (NSTEMI) myocardial infarction: Secondary | ICD-10-CM

## 2024-03-09 DIAGNOSIS — E1065 Type 1 diabetes mellitus with hyperglycemia: Secondary | ICD-10-CM

## 2024-03-09 DIAGNOSIS — C73 Malignant neoplasm of thyroid gland: Secondary | ICD-10-CM | POA: Diagnosis not present

## 2024-03-09 DIAGNOSIS — R490 Dysphonia: Secondary | ICD-10-CM | POA: Diagnosis not present

## 2024-03-09 DIAGNOSIS — Z510 Encounter for antineoplastic radiation therapy: Secondary | ICD-10-CM | POA: Diagnosis not present

## 2024-03-09 DIAGNOSIS — Z930 Tracheostomy status: Secondary | ICD-10-CM | POA: Diagnosis not present

## 2024-03-09 LAB — GLUCOSE, CAPILLARY
Glucose-Capillary: 120 mg/dL — ABNORMAL HIGH (ref 70–99)
Glucose-Capillary: 159 mg/dL — ABNORMAL HIGH (ref 70–99)
Glucose-Capillary: 193 mg/dL — ABNORMAL HIGH (ref 70–99)
Glucose-Capillary: 219 mg/dL — ABNORMAL HIGH (ref 70–99)
Glucose-Capillary: 223 mg/dL — ABNORMAL HIGH (ref 70–99)
Glucose-Capillary: 226 mg/dL — ABNORMAL HIGH (ref 70–99)
Glucose-Capillary: 287 mg/dL — ABNORMAL HIGH (ref 70–99)
Glucose-Capillary: 76 mg/dL (ref 70–99)

## 2024-03-09 LAB — RAD ONC ARIA SESSION SUMMARY
Course Elapsed Days: 3
Plan Fractions Treated to Date: 4
Plan Prescribed Dose Per Fraction: 4.4 Gy
Plan Total Fractions Prescribed: 5
Plan Total Prescribed Dose: 22 Gy
Reference Point Dosage Given to Date: 17.6 Gy
Reference Point Session Dosage Given: 4.4 Gy
Session Number: 4

## 2024-03-09 LAB — CULTURE, BLOOD (ROUTINE X 2)
Culture: NO GROWTH
Culture: NO GROWTH
Special Requests: ADEQUATE

## 2024-03-09 MED ORDER — DIPHENHYDRAMINE HCL 12.5 MG/5ML PO ELIX
25.0000 mg | ORAL_SOLUTION | Freq: Once | ORAL | Status: AC
  Administered 2024-03-09: 25 mg via ORAL
  Filled 2024-03-09 (×2): qty 10

## 2024-03-09 MED ORDER — DIPHENHYDRAMINE HCL 25 MG PO CAPS
25.0000 mg | ORAL_CAPSULE | Freq: Once | ORAL | Status: DC
  Filled 2024-03-09: qty 1

## 2024-03-09 NOTE — Progress Notes (Signed)
 Subjective: No issues overnight.  Tolerated radiation treatment so far.  Objective: Vital signs in last 24 hours: Temp:  [98 F (36.7 C)-98.7 F (37.1 C)] 98 F (36.7 C) (06/20 0626) Pulse Rate:  [78-103] 81 (06/20 0626) Resp:  [15-19] 17 (06/20 0626) BP: (99-148)/(28-92) 128/81 (06/20 0626) SpO2:  [96 %-100 %] 98 % (06/20 0626) FiO2 (%):  [35 %] 35 % (06/19 2101)  Exam: General: Sleepy but arousable. Eyes: Pupils are equal, round, reactive to light. Extraocular motion is intact.  Ears: Examination of the ears shows normal auricles and external auditory canals bilaterally. Nose: Nasal examination shows normal mucosa, septum, turbinates.   Face: Facial examination shows no asymmetry. Palpation of the face elicit no significant tenderness.  Mouth: Oral cavity examination shows no mucosal abnormalities.  Neck: Palpation of the neck reveals a large firm and fixed mass within the right thyroid  lobe.  The tracheostomy tube is in place and patent.  No bleeding is noted.  Recent Labs    03/07/24 0543  WBC 14.4*  HGB 9.4*  HCT 29.7*  PLT 399   Recent Labs    03/07/24 0543  NA 130*  K 4.9  CL 95*  CO2 27  GLUCOSE 297*  BUN 45*  CREATININE 0.88  CALCIUM  9.2    Medications: I have reviewed the patient's current medications. Scheduled:  aspirin   81 mg Oral Daily   clopidogrel   75 mg Oral Daily   diltiazem   30 mg Oral BID   diphenhydrAMINE  25 mg Oral Once   famotidine   40 mg Oral Daily   free water   100 mL Per Tube Q4H   heparin   5,000 Units Subcutaneous Q8H   insulin  aspart  0-20 Units Subcutaneous Q4H   insulin  aspart  10 Units Subcutaneous Q4H   insulin  glargine-yfgn  36 Units Subcutaneous QHS   latanoprost   1 drop Both Eyes QHS   multivitamin with minerals  1 tablet Oral Daily   mouth rinse  15 mL Mouth Rinse 4 times per day   polyethylene glycol  17 g Oral Daily   rosuvastatin   40 mg Oral Daily   timolol   1 drop Both Eyes BID   traZODone  50 mg Oral QHS    Continuous:  feeding supplement (GLUCERNA 1.5 CAL) 45 mL/hr at 03/08/24 2300    Assessment/Plan: Right anaplastic thyroid  carcinoma with thyroid  cartilage invasion.  - Currently receiving palliative radiation. 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  The family and neurology have decided to proceed with no intervention. - Uncuffed #6 Shiley tracheostomy tube is in place and patent. - Not a surgical candidate. - Comfort care at home after completing her palliative XRT.   LOS: 13 days   Navin Dogan W Tevan Marian 03/09/2024, 7:42 AM

## 2024-03-09 NOTE — Plan of Care (Signed)
 Completed radiation therapy and swallow evaluation today, current recommendations remain unchanged except to avoid wearing Doreatha Gamer Valve for longer than 10 minutes due to increased work of breathing after prolonged wear.

## 2024-03-09 NOTE — Progress Notes (Signed)
 Progress Note   Patient: Nancy Sutton JWJ:[id number] DOB: 17-Nov-1945 DOA: 02/25/2024     13 DOS: the patient was seen and examined on 03/09/2024   Brief hospital course: 78 y.o female with past medical history of stroke with residual left hemianopia, hypertension, diabetes, CAD status post stent with history of thyroid  mass being followed by general surgery with plans for surgical intervention presented to hospital with difficulty clearing her throat, and worsening neck pain.  Patient was transferred from [facility] to [facility] for evaluation by general surgery.  During hospitalization patient was seen by ENT. Patient was seen by Dr. Bobetta Burrows with plan for tracheostomy 6/11, and open thyroid  biopsy for definitive histologic diagnosis, medical oncology, radiation oncology has been involved as well, as well Dr. Teo in communication with ENT at [facility].  Pathology showed anaplastic thyroid  carcinoma.  Patient was then transferred to transferring to [facility] for radiation.  Oncology and radiation oncology have been consulted.  Palliative care on board, discussed with patient, family,  agreed for home hospice after palliative radiation therapy on monday.   Assessment and plan: Anaplastic Thyroid  Carcinoma  s/p open thyroid  biopsy and tracheostomy -> changed to uncuffed tube per Dr. [name]. 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  CT scan of the abdomen chest and pelvis without any metastatic disease.  CT neck with irregular peripherally enhancing centrally hypodense lesion within the right thyroid  fossa with associated mild right cervical lymphadenopathy.  LN's centrally hypodense (suppurative or necrotic) (concerning for metastatic thyroid  cancer - see report).  Patient has been transferred to [facility] long for possible radiation.  oncology (Pasam) and radiation oncology [name]) have been consulted, appreciate assistance.  Has been put on cortrak tube for tube feeding.  Speech  therapy has evaluated the patient.   Systemic Inflammatory Response Syndrome Fever has improved.  Still has leukocytosis.  Chest x-ray without any obvious infiltrate.  UA with positive white cells and leukocyte esterase.  Follow-up urine culture blood culture.  Currently on Levaquin Flagyl  given ampicillin allergy   Acute recurrent stroke History of stroke with left-sided hemianopsia.  MRI with patchy acute ischemic nonhemorrhagic right frontal, parietal, and occipital infarcts (watershed in distribution), chronic R PCA distribution infarct.  Loss of normal flow void within cavernous R ICA related to previously identified focal occlusion vs high grade stenosis.   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  Distal reconstitution via colateral flow eith via the ophthalmic artery or cross the circle of willis.  Favored to reflect severe high grade progressive stenosis. Echo with normal EF.  Hemoglobin A1c of 6.9.  Seen by neurology during hospitalization.  Okay to start DAPT as per ENT.  Continue statin. Speech therapy has recommended nectar thick liquids.  Continue PT OT.   Family decided to take her home with hospice care, she will need hospice equipment arranged and last treatment of RT  Monday. Palliative on board.   CAD s/p PCI in March 2024.  Has seen her cardiologist and was cleared to undergo surgical procedure on 02/22/2024, continue Plavix , aspirin , statin.     Paroxysmal A-fib in 2024.  This was in conjunction of her NSTEMI.  Not on anticoagulation, follows with her cardiologist, continue low-dose Cardizem .   Dyslipidemia.  Continue combination of statin and Zetia.   Hypomagnesemia.  Replaced.  Latest magnesium  of 2.2   Hypokalemia.  Replenished.  Latest potassium of 4.2  Essential hypertension.  On Cardizem .  Blood pressure seems to be stable   DM type II  Latest hemoglobin A1c 12/2023 was 6.9  continue insulin  regimen.   Glaucoma Continue timolol        Assessment and Plan: No notes have been filed under this hospital service. Service: Hospitalist        Out of bed to chair. Incentive spirometry. Nursing supportive care. Fall, aspiration precautions. Diet:  Diet Orders (From admission, onward)     Start     Ordered   03/05/24 1044  Diet full liquid Room service appropriate? Yes; Fluid consistency: Nectar Thick  Diet effective now       Question Answer Comment  Room service appropriate? Yes   Fluid consistency: Nectar Thick      03/05/24 1043           DVT prophylaxis: heparin  injection 5,000 Units Start: 02/26/24 1400  Level of care: Telemetry   Code Status: Limited: Do not attempt resuscitation (DNR) -DNR-LIMITED -Do Not Intubate/DNI   Subjective: Patient is seen and examined today morning. Son at bedside. She is sleepy, able to open eyes. On supplemental o2.  Physical Exam: Vitals:   03/09/24 0834 03/09/24 1104 03/09/24 1107 03/09/24 1151  BP:  (!) 69/29 (!) 123/48   Pulse: 88 78  80  Resp: 18 16  16   Temp:  (!) 97.5 F (36.4 C)    TempSrc:  Oral    SpO2: 98% 99%  99%  Weight:      Height:        General - Elderly ill looking Caucasian female, sleepy, lethargic, no distress HEENT - PERRLA, EOMI, atraumatic head, non tender sinuses. Lung - Clear, trach collar, diffuse rales, rhonchi, wheezes. Heart - S1, S2 heard, no murmurs, rubs, trace pedal edema. Abdomen - Soft, non tender, bowel sounds good Neuro - sleepy, lethargic, non focal exam. Skin - Warm and dry.  Data Reviewed:      Latest Ref Rng & Units 03/07/2024    5:43 AM 03/06/2024    6:14 AM 03/05/2024    4:35 AM  CBC  WBC 4.0 - 10.5 K/uL 14.4  16.1  15.8   Hemoglobin 12.0 - 15.0 g/dL 9.4  9.6  16.1   Hematocrit 36.0 - 46.0 % 29.7  30.7  31.1   Platelets 150 - 400 K/uL 399  394  397       Latest Ref Rng & Units 03/07/2024    5:43 AM 03/06/2024    6:14 AM 03/05/2024    4:35 AM   BMP  Glucose 70 - 99 mg/dL 096  045  409   BUN 8 - 23 mg/dL 45  45  36   Creatinine 0.44 - 1.00 mg/dL 8.11  9.14  7.82   Sodium 135 - 145 mmol/L 130  135  132   Potassium 3.5 - 5.1 mmol/L 4.9  4.2  4.4   Chloride 98 - 111 mmol/L 95  99  98   CO2 22 - 32 mmol/L 27  25  25    Calcium  8.9 - 10.3 mg/dL 9.2  9.3  8.8    No results found.  Family Communication: Discussed with patient, understand and agree. All questions answered.  Disposition: Status is: Inpatient Remains inpatient appropriate because: palliative radiation therapy.  Planned Discharge Destination: home with hospice     Time spent: 40 minutes  Author: Aisha Hove, MD 03/09/2024 1:01 PM Secure chat 7am to 7pm For on call review [email or web address].

## 2024-03-09 NOTE — Progress Notes (Signed)
 Speech Language Pathology Treatment: Dysphagia;Passy Muir Speaking valve  Patient Details Name: Nancy Sutton MRN: [id number] DOB: 26-Jan-1946 Today's Date: 03/09/2024 Time: 4098-1191 SLP Time Calculation (min) (ACUTE ONLY): 57 min  Assessment / Plan / Recommendation Clinical Impression  Pt is drowsy today but agreeable to using her PMV and trying some POs. Per review of chart and in conversation with son, pt has been having some reports of increased difficulty breathing, not immediately relieved by removing PMV but feeling better after a short break. SLP donned PMV with fairly consistent increase in WOB after 10 minutes of use. This was associated with audible exhalations, but pt subjectively denied any difficulty breathing and there was no overt back pressure noted upon removal. There was a definitive change in her WOB though. SLP continued to give breaks every 10 minutes with PMV use, allowing her time to speak and consume POs. Education was provided to son about updated recommendations for use, which would be to wear it in short intervals when alert, still making sure that it is on for PO intake. We discussed signs for which to monitor that would indicate reduced tolerance, including what back pressure would look like.  Pt had ice chips and nectar thick broth with occasional delayed cough, but son thinks she is doing well and if anything a little better with current diet. He has already been thinking a lot about how to accommodate diet at home. He asked about repeating MBS prior to discharge. We discussed that this could be an option to see if she has made any changes. Given how she seems to fatigue, and considering that she is going home with hospice, also discussed the option of comfort feeds and liberalizing diet without testing, if pt/family are accepting of aspiration risk. SLP asked pt if there were any foods in particular that she is wanting but hasn't been able to get, but she did not have  anything in mind. Son asked about several foods that she typically enjoys and education was offered about how to modify them for likely ease of swallowing. Pt and family would like to discuss over the weekend if they want to pursue repeat testing. Encouraged them to talk with MD and/or palliative care further. SLP will continue to follow while inpatient.   HPI HPI: 78 yo female presenting 6/7 with rapidly enlarging thyroid  mass. CT Neck showed 4.1 x 2.5 x 3.9 cm heterogeneous mass in the right anterior neck at  the level of the thyroid  concerning for residual/recurrent thyroid  malignancy. Associated mass effect and leftward shift of the airway without significant airway narrowing. 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 Associated retropharyngeal edema. Trach done by ENT 6/11. Code stroke was called 6/13 due to L sided weakness although time of onset likely the day prior. BS completed on 6/12 recommending Dys 2, nectar thick liquids but patient the next day, was having difficulty swallowing and had new onset stroke symptoms. MRI showed Patchy acute ischemic nonhemorrhagic right frontal, parietal, and occipital infarcts, watershed in distribution. Patient made NPO awaiting reevaluation of swallowing with repeat MBS recommended. Large bore NG changed to small bore. PMH includes: GERD, stroke, Barrett's esophagus with esophageal dysphagia s/p dilation, HLD, NSTEMI, PAF, CAD, HTN      SLP Plan  Continue with current plan of care          Recommendations  Diet recommendations: Nectar-thick liquid (full liquid diet, thickened to nectar thick; cna  also have ice chips) Liquids provided via: Straw;Cup Medication Administration: Crushed with puree Compensations: Slow rate;Small sips/bites Postural Changes and/or Swallow Maneuvers: Seated upright 90 degrees      Patient may use Passy-Muir Speech Valve:  Intermittently with supervision;During PO intake/meals;Caregiver trained to provide supervision (wear in 10 minute intervals whenever awake) PMSV Supervision: Full           Oral care prior to ice chip/H20;Oral care before and after PO;Oral care BID   Frequent or constant Supervision/Assistance Dysphagia, oropharyngeal phase (R13.12);Aphonia (R49.1)     Continue with current plan of care     Beth Brooke., M.A. CCC-SLP Acute Rehabilitation Services Office: [phone]  Secure chat preferred   03/09/2024, 12:54 PM

## 2024-03-09 NOTE — Inpatient Diabetes Management (Signed)
 Inpatient Diabetes Program Recommendations  AACE/ADA: New Consensus Statement on Inpatient Glycemic Control (2015)  Target Ranges:  Prepandial:   less than 140 mg/dL      Peak postprandial:   less than 180 mg/dL (1-2 hours)      Critically ill patients:  140 - 180 mg/dL   Lab Results  Component Value Date   GLUCAP 120 (H) 03/09/2024   HGBA1C 6.9 (A) 01/03/2024    Review of Glycemic Control  Diabetes history: DM2 Outpatient Diabetes medications: Trulicity  1.5 mg weekly, Tresiba  30 at bedtime, metformin  500 daily Current orders for Inpatient glycemic control: Semglee  36 units at bedtime, Novolog  0-20 Q4H + 10 units Q4H  TF - Osmolite 1.5 at 45/H  Inpatient Diabetes Program Recommendations:    Decrease Semglee  to 35 units at bedtime  Decrease Novolog  to 8 units Q4H  Watch trends.  Will pt be going home with TFs?  Thank you. Joni Net, RD, LDN, CDCES Inpatient Diabetes Coordinator [phone]

## 2024-03-09 NOTE — Plan of Care (Signed)

## 2024-03-10 DIAGNOSIS — I639 Cerebral infarction, unspecified: Secondary | ICD-10-CM | POA: Diagnosis not present

## 2024-03-10 DIAGNOSIS — R490 Dysphonia: Secondary | ICD-10-CM | POA: Diagnosis not present

## 2024-03-10 DIAGNOSIS — Z930 Tracheostomy status: Secondary | ICD-10-CM | POA: Diagnosis not present

## 2024-03-10 DIAGNOSIS — C73 Malignant neoplasm of thyroid gland: Secondary | ICD-10-CM | POA: Diagnosis not present

## 2024-03-10 LAB — GLUCOSE, CAPILLARY
Glucose-Capillary: 238 mg/dL — ABNORMAL HIGH (ref 70–99)
Glucose-Capillary: 206 mg/dL — ABNORMAL HIGH (ref 70–99)
Glucose-Capillary: 251 mg/dL — ABNORMAL HIGH (ref 70–99)
Glucose-Capillary: 178 mg/dL — ABNORMAL HIGH (ref 70–99)
Glucose-Capillary: 251 mg/dL — ABNORMAL HIGH (ref 70–99)

## 2024-03-10 MED ORDER — LORAZEPAM 2 MG/ML PO CONC
1.0000 mg | ORAL | Status: DC | PRN
  Administered 2024-03-10 – 2024-03-13 (×6): 1 mg via ORAL
  Filled 2024-03-10 (×7): qty 1

## 2024-03-10 MED ORDER — DIPHENHYDRAMINE HCL 12.5 MG/5ML PO ELIX
25.0000 mg | ORAL_SOLUTION | Freq: Once | ORAL | Status: AC
  Administered 2024-03-10: 25 mg via ORAL
  Filled 2024-03-10: qty 10

## 2024-03-10 NOTE — Plan of Care (Signed)
   Problem: Education: Goal: Ability to describe self-care measures that may prevent or decrease complications (Diabetes Survival Skills Education) will improve Outcome: Progressing Goal: Individualized Educational Video(s) Outcome: Progressing   Problem: Coping: Goal: Ability to adjust to condition or change in health will improve Outcome: Progressing

## 2024-03-10 NOTE — Progress Notes (Signed)
 Progress Note   Patient: Nancy Sutton FMW:[id number] DOB: 01-28-1946 DOA: 02/25/2024     14 DOS: the patient was seen and examined on 03/10/2024   Brief hospital course: 78 y.o female with past medical history of stroke with residual left hemianopia, hypertension, diabetes, CAD status post stent with history of thyroid  mass being followed by general surgery with plans for surgical intervention presented to hospital with difficulty clearing her throat, and worsening neck pain.  Patient was transferred from [facility] to [facility] for evaluation by general surgery.  During hospitalization patient was seen by ENT. Patient was seen by Dr. Darel with plan for tracheostomy 6/11, and open thyroid  biopsy for definitive histologic diagnosis, medical oncology, radiation oncology has been involved as well, as well Dr. Teo in communication with ENT at [facility].  Pathology showed anaplastic thyroid  carcinoma.  Patient was then transferred to transferring to [facility] for radiation.  Oncology and radiation oncology have been consulted.  Palliative care on board, discussed with patient, family,  agreed for home hospice after palliative radiation therapy on monday.  Assessment and plan: Anaplastic Thyroid  Carcinoma  s/p open thyroid  biopsy and tracheostomy -> changed to uncuffed tube per Dr. Karis. 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  CT scan of the abdomen chest and pelvis without any metastatic disease.  CT neck with irregular peripherally enhancing centrally hypodense lesion within the right thyroid  fossa with associated mild right cervical lymphadenopathy.  LN's centrally hypodense (suppurative or necrotic) (concerning for metastatic thyroid  cancer - see report).  Patient has been transferred to [facility] long for possible radiation.  oncology (Pasam) and radiation oncology Audry) have been consulted, appreciate assistance.  Has been put on cortrak tube for tube feeding.  Palliative  care on board, discussed with patient, family,  agreed for home hospice after palliative radiation therapy on monday.   Systemic Inflammatory Response Syndrome Fever has improved.  Still has leukocytosis.  Chest x-ray without any obvious infiltrate.  UA with positive white cells and leukocyte esterase.  Follow-up urine culture blood culture.  Finished Levaquin  Flagyl  therapy.   Acute recurrent stroke History of stroke with left-sided hemianopsia.  MRI with patchy acute ischemic nonhemorrhagic right frontal, parietal, and occipital infarcts (watershed in distribution), chronic R PCA distribution infarct.  Loss of normal flow void within cavernous R ICA related to previously identified focal occlusion vs high grade stenosis.   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  Distal reconstitution via colateral flow eith via the ophthalmic artery or cross the circle of willis.  Favored to reflect severe high grade progressive stenosis. Echo with normal EF.  Hemoglobin A1c of 6.9.  Seen by neurology during hospitalization.  Okay to start DAPT as per ENT.  Continue statin. Speech therapy has recommended nectar thick liquids.  Continue PT OT.   Family decided to take her home with hospice care, she will need hospice equipment arranged and last treatment of RT  Monday. Palliative on board.   CAD s/p PCI in March 2024.  Has seen her cardiologist and was cleared to undergo surgical procedure on 02/22/2024, continue Plavix , aspirin , statin.     Paroxysmal A-fib in 2024.  This was in conjunction of her NSTEMI.  Not on anticoagulation, follows with her cardiologist, continue low-dose Cardizem .   Dyslipidemia.  Continue combination of statin and Zetia.   Hypomagnesemia.  Replaced.  Latest magnesium  of 2.2  Hypokalemia.  Replenished.  Latest potassium of 4.2   Essential hypertension.  On Cardizem .  Blood pressure seems to be  stable   DM type II  Latest hemoglobin A1c 12/2023 was 6.9 continue insulin  regimen.   Glaucoma Continue timolol .  Discussed with husband at bedside regarding her care. He understand poor prognosis, knows that focus will be on symptoms management only, tube feeds will be stopped. He agreed for discharge plan of Home with hospice on Monday.    Nursing supportive care. Fall, aspiration precautions. Diet:  Diet Orders (From admission, onward)     Start     Ordered   03/05/24 1044  Diet full liquid Room service appropriate? Yes; Fluid consistency: Nectar Thick  Diet effective now       Question Answer Comment  Room service appropriate? Yes   Fluid consistency: Nectar Thick      03/05/24 1043           DVT prophylaxis: heparin  injection 5,000 Units Start: 02/26/24 1400  Level of care: Telemetry   Code Status: Limited: Do not attempt resuscitation (DNR) -DNR-LIMITED -Do Not Intubate/DNI   Subjective: Patient is seen and examined today morning. Husband at bedside. She is sleeping comfortably.  Physical Exam: Vitals:   03/09/24 2048 03/09/24 2333 03/10/24 0403 03/10/24 0828  BP: (!) 125/45 (!) 132/41 (!) 149/64   Pulse: 83 74 73 82  Resp: 18 18 18 18   Temp: 98.2 F (36.8 C) 97.8 F (36.6 C) 97.8 F (36.6 C)   TempSrc: Oral Oral Oral   SpO2: 100% 99% 100% 99%  Weight:   66 kg   Height:        General - Elderly ill looking Caucasian female, sleepy, lethargic, no distress HEENT - PERRLA, EOMI, atraumatic head, non tender sinuses. Lung - Clear, trach collar, diffuse rales, rhonchi, wheezes. Heart - S1, S2 heard, no murmurs, rubs, trace pedal edema. Abdomen - Soft, non tender, bowel sounds good Neuro - sleepy, lethargic, non focal exam. Skin - Warm and dry.  Data Reviewed:      Latest Ref Rng & Units 03/07/2024    5:43 AM 03/06/2024    6:14 AM 03/05/2024    4:35 AM  CBC  WBC 4.0 - 10.5 K/uL 14.4  16.1  15.8   Hemoglobin 12.0 - 15.0 g/dL 9.4  9.6  89.8    Hematocrit 36.0 - 46.0 % 29.7  30.7  31.1   Platelets 150 - 400 K/uL 399  394  397       Latest Ref Rng & Units 03/07/2024    5:43 AM 03/06/2024    6:14 AM 03/05/2024    4:35 AM  BMP  Glucose 70 - 99 mg/dL 702  749  622   BUN 8 - 23 mg/dL 45  45  36   Creatinine 0.44 - 1.00 mg/dL 9.11  9.05  8.98   Sodium 135 - 145 mmol/L 130  135  132   Potassium 3.5 - 5.1 mmol/L 4.9  4.2  4.4   Chloride 98 - 111 mmol/L 95  99  98   CO2 22 - 32 mmol/L 27  25  25    Calcium  8.9 - 10.3 mg/dL 9.2  9.3  8.8    No results found.  Family Communication: Discussed with patient's family, understand and agree. All questions answered.  Disposition: Status is: Inpatient Remains inpatient appropriate because: palliative radiation therapy.  Planned Discharge Destination: home with hospice     Time spent: 41 minutes  Author: Concepcion Riser, MD 03/10/2024 9:28 AM Secure chat 7am to 7pm For on call review [email or web address].

## 2024-03-10 NOTE — Plan of Care (Signed)
  Problem: Coping: Goal: Ability to adjust to condition or change in health will improve Outcome: Progressing   Problem: Fluid Volume: Goal: Ability to maintain a balanced intake and output will improve Outcome: Progressing   Problem: Health Behavior/Discharge Planning: Goal: Ability to manage health-related needs will improve Outcome: Progressing   Problem: Nutritional: Goal: Maintenance of adequate nutrition will improve Outcome: Progressing Goal: Progress toward achieving an optimal weight will improve Outcome: Progressing

## 2024-03-11 DIAGNOSIS — I639 Cerebral infarction, unspecified: Secondary | ICD-10-CM | POA: Diagnosis not present

## 2024-03-11 DIAGNOSIS — C73 Malignant neoplasm of thyroid gland: Secondary | ICD-10-CM | POA: Diagnosis not present

## 2024-03-11 DIAGNOSIS — Z930 Tracheostomy status: Secondary | ICD-10-CM | POA: Diagnosis not present

## 2024-03-11 DIAGNOSIS — R490 Dysphonia: Secondary | ICD-10-CM | POA: Diagnosis not present

## 2024-03-11 LAB — GLUCOSE, CAPILLARY
Glucose-Capillary: 139 mg/dL — ABNORMAL HIGH (ref 70–99)
Glucose-Capillary: 163 mg/dL — ABNORMAL HIGH (ref 70–99)
Glucose-Capillary: 167 mg/dL — ABNORMAL HIGH (ref 70–99)
Glucose-Capillary: 211 mg/dL — ABNORMAL HIGH (ref 70–99)
Glucose-Capillary: 221 mg/dL — ABNORMAL HIGH (ref 70–99)
Glucose-Capillary: 234 mg/dL — ABNORMAL HIGH (ref 70–99)
Glucose-Capillary: 250 mg/dL — ABNORMAL HIGH (ref 70–99)

## 2024-03-11 NOTE — Progress Notes (Signed)
 Patient complaining of feeling anxious after passy muir valve was placed onto trach by family. Pt indicated that she felt that she could not swallow as good. Patient was suctioned with minimal secretions given back. Called respiratory to assess patient and removed passy muir valve until respiratory assesses.

## 2024-03-11 NOTE — Progress Notes (Signed)
 Progress Note   Patient: Nancy Sutton FMW:[id number] DOB: 11-15-1945 DOA: 02/25/2024     15 DOS: the patient was seen and examined on 03/11/2024   Brief hospital course: 78 y.o female with past medical history of stroke with residual left hemianopia, hypertension, diabetes, CAD status post stent with history of thyroid  mass being followed by general surgery with plans for surgical intervention presented to hospital with difficulty clearing her throat, and worsening neck pain.  Patient was transferred from [facility] to [facility] for evaluation by general surgery.  During hospitalization patient was seen by ENT. Patient was seen by Dr. Darel with plan for tracheostomy 6/11, and open thyroid  biopsy for definitive histologic diagnosis, medical oncology, radiation oncology has been involved as well, as well Dr. Teo in communication with ENT at [facility].  Pathology showed anaplastic thyroid  carcinoma.  Patient was then transferred to transferring to [facility] for radiation.  Oncology and radiation oncology have been consulted.  Palliative care on board, discussed with patient, family,  agreed for home hospice after palliative radiation therapy on monday.  Assessment and plan: Anaplastic Thyroid  Carcinoma  s/p open thyroid  biopsy and tracheostomy -> changed to uncuffed tube per Dr. Karis. 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  CT scan of the abdomen chest and pelvis without any metastatic disease.  CT neck with irregular peripherally enhancing centrally hypodense lesion within the right thyroid  fossa with associated mild right cervical lymphadenopathy.  LN's centrally hypodense (suppurative or necrotic) (concerning for metastatic thyroid  cancer - see report).  Patient has been transferred to [facility] long for possible radiation.  oncology (Pasam) and radiation oncology Audry) have been consulted, appreciate assistance.  Has been put on cortrak tube for tube feeding.  Palliative  care on board, discussed with patient, family,  agreed for home hospice after palliative radiation therapy on monday.   Systemic Inflammatory Response Syndrome Fever has improved.  Still has leukocytosis.  Chest x-ray without any obvious infiltrate.  UA with positive white cells and leukocyte esterase.  Follow-up urine culture blood culture.  Finished Levaquin  Flagyl  therapy.   Acute recurrent stroke History of stroke with left-sided hemianopsia.  MRI with patchy acute ischemic nonhemorrhagic right frontal, parietal, and occipital infarcts (watershed in distribution), chronic R PCA distribution infarct.  Loss of normal flow void within cavernous R ICA related to previously identified focal occlusion vs high grade stenosis.   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  Distal reconstitution via colateral flow eith via the ophthalmic artery or cross the circle of willis.  Favored to reflect severe high grade progressive stenosis. Echo with normal EF.  Hemoglobin A1c of 6.9.  Seen by neurology during hospitalization.  Okay to start DAPT as per ENT.  Continue statin. Speech therapy has recommended nectar thick liquids.  Continue PT OT.   Family decided to take her home with hospice care, she will need hospice equipment arranged and last treatment of RT  Monday. Palliative on board.   CAD s/p PCI in March 2024.  Has seen her cardiologist and was cleared to undergo surgical procedure on 02/22/2024, continue Plavix , aspirin , statin.     Paroxysmal A-fib in 2024.  This was in conjunction of her NSTEMI.  Not on anticoagulation, follows with her cardiologist, continue low-dose Cardizem .   Dyslipidemia.  Continue combination of statin and Zetia.   Hypomagnesemia.  Replaced.  Latest magnesium  of 2.2  Hypokalemia.  Replenished.  Latest potassium of 4.2   Essential hypertension.  On Cardizem .  Blood pressure seems to be  stable   DM type II  Latest hemoglobin A1c 12/2023 was 6.9 continue insulin  regimen.   Glaucoma Continue timolol .  Discussed with husband at bedside today again regarding her current care and discharge plan. He understand poor prognosis, knows that focus will be on symptoms management only, tube feeds will be stopped. He agreed for discharge plan of Home with hospice once hospice arrangements are made.    Nursing supportive care. Fall, aspiration precautions. Diet:  Diet Orders (From admission, onward)     Start     Ordered   03/05/24 1044  Diet full liquid Room service appropriate? Yes; Fluid consistency: Nectar Thick  Diet effective now       Question Answer Comment  Room service appropriate? Yes   Fluid consistency: Nectar Thick      03/05/24 1043           DVT prophylaxis: heparin  injection 5,000 Units Start: 02/26/24 1400  Level of care: Telemetry   Code Status: Limited: Do not attempt resuscitation (DNR) -DNR-LIMITED -Do Not Intubate/DNI   Subjective: Patient is seen and examined today morning. Husband at bedside. She is sleeping comfortably. No overnight issues.  Physical Exam: Vitals:   03/11/24 0706 03/11/24 0830 03/11/24 1128 03/11/24 1253  BP:      Pulse:  86 78 81  Resp:  17 18 16   Temp:      TempSrc:      SpO2:  98% 97% 100%  Weight: 66.6 kg     Height:        General - Elderly ill looking Caucasian female, sleepy, lethargic, no distress HEENT - PERRLA, EOMI, atraumatic head, non tender sinuses. Lung - Clear, trach collar, diffuse rales, rhonchi, wheezes. Heart - S1, S2 heard, no murmurs, rubs, trace pedal edema. Abdomen - Soft, non tender, bowel sounds good Neuro - sleepy, lethargic, non focal exam. Skin - Warm and dry.  Data Reviewed:      Latest Ref Rng & Units 03/07/2024    5:43 AM 03/06/2024    6:14 AM 03/05/2024    4:35 AM  CBC  WBC 4.0 - 10.5 K/uL 14.4  16.1  15.8   Hemoglobin 12.0 - 15.0 g/dL 9.4  9.6  89.8   Hematocrit 36.0 - 46.0  % 29.7  30.7  31.1   Platelets 150 - 400 K/uL 399  394  397       Latest Ref Rng & Units 03/07/2024    5:43 AM 03/06/2024    6:14 AM 03/05/2024    4:35 AM  BMP  Glucose 70 - 99 mg/dL 702  749  622   BUN 8 - 23 mg/dL 45  45  36   Creatinine 0.44 - 1.00 mg/dL 9.11  9.05  8.98   Sodium 135 - 145 mmol/L 130  135  132   Potassium 3.5 - 5.1 mmol/L 4.9  4.2  4.4   Chloride 98 - 111 mmol/L 95  99  98   CO2 22 - 32 mmol/L 27  25  25    Calcium  8.9 - 10.3 mg/dL 9.2  9.3  8.8    No results found.  Family Communication: Discussed with patient's family, understand and agree. All questions answered.  Disposition: Status is: Inpatient Remains inpatient appropriate because: palliative radiation therapy.  Planned Discharge Destination: home with hospice once arraignments made.     Time  spent: 39 minutes  Author: Concepcion Riser, MD 03/11/2024 1:45 PM Secure chat 7am to 7pm For on call review [email or web address].

## 2024-03-11 NOTE — Plan of Care (Signed)
  Problem: Education: Goal: Ability to describe self-care measures that may prevent or decrease complications (Diabetes Survival Skills Education) will improve Outcome: Progressing   Problem: Coping: Goal: Ability to adjust to condition or change in health will improve Outcome: Progressing   Problem: Fluid Volume: Goal: Ability to maintain a balanced intake and output will improve Outcome: Progressing   Problem: Health Behavior/Discharge Planning: Goal: Ability to identify and utilize available resources and services will improve Outcome: Progressing Goal: Ability to manage health-related needs will improve Outcome: Progressing   Problem: Metabolic: Goal: Ability to maintain appropriate glucose levels will improve Outcome: Progressing   Problem: Nutritional: Goal: Maintenance of adequate nutrition will improve Outcome: Progressing Goal: Progress toward achieving an optimal weight will improve Outcome: Progressing   Problem: Skin Integrity: Goal: Risk for impaired skin integrity will decrease Outcome: Progressing   Problem: Tissue Perfusion: Goal: Adequacy of tissue perfusion will improve Outcome: Progressing   Problem: Education: Goal: Knowledge of General Education information will improve Description: Including pain rating scale, medication(s)/side effects and non-pharmacologic comfort measures Outcome: Progressing   Problem: Health Behavior/Discharge Planning: Goal: Ability to manage health-related needs will improve Outcome: Progressing   Problem: Clinical Measurements: Goal: Ability to maintain clinical measurements within normal limits will improve Outcome: Progressing Goal: Will remain free from infection Outcome: Progressing Goal: Diagnostic test results will improve Outcome: Progressing Goal: Respiratory complications will improve Outcome: Progressing Goal: Cardiovascular complication will be avoided Outcome: Progressing   Problem: Activity: Goal:  Risk for activity intolerance will decrease Outcome: Progressing   Problem: Nutrition: Goal: Adequate nutrition will be maintained Outcome: Progressing   Problem: Coping: Goal: Level of anxiety will decrease Outcome: Progressing   Problem: Elimination: Goal: Will not experience complications related to bowel motility Outcome: Progressing Goal: Will not experience complications related to urinary retention Outcome: Progressing   Problem: Pain Managment: Goal: General experience of comfort will improve and/or be controlled Outcome: Progressing   Problem: Safety: Goal: Ability to remain free from injury will improve Outcome: Progressing   Problem: Skin Integrity: Goal: Risk for impaired skin integrity will decrease Outcome: Progressing   Problem: Education: Goal: Knowledge of disease or condition will improve Outcome: Progressing Goal: Knowledge of secondary prevention will improve (MUST DOCUMENT ALL) Outcome: Progressing Goal: Knowledge of patient specific risk factors will improve (DELETE if not current risk factor) Outcome: Progressing   Problem: Ischemic Stroke/TIA Tissue Perfusion: Goal: Complications of ischemic stroke/TIA will be minimized Outcome: Progressing   Problem: Coping: Goal: Will verbalize positive feelings about self Outcome: Progressing Goal: Will identify appropriate support needs Outcome: Progressing   Problem: Health Behavior/Discharge Planning: Goal: Ability to manage health-related needs will improve Outcome: Progressing Goal: Goals will be collaboratively established with patient/family Outcome: Progressing   Problem: Self-Care: Goal: Ability to participate in self-care as condition permits will improve Outcome: Progressing Goal: Verbalization of feelings and concerns over difficulty with self-care will improve Outcome: Progressing Goal: Ability to communicate needs accurately will improve Outcome: Progressing   Problem:  Nutrition: Goal: Risk of aspiration will decrease Outcome: Progressing Goal: Dietary intake will improve Outcome: Progressing

## 2024-03-12 ENCOUNTER — Other Ambulatory Visit: Payer: Self-pay

## 2024-03-12 ENCOUNTER — Ambulatory Visit
Admit: 2024-03-12 | Discharge: 2024-03-12 | Discharge status: Home / Self Care | Payer: Self-pay | Attending: Radiation Oncology

## 2024-03-12 ENCOUNTER — Ambulatory Visit
Admit: 2024-03-12 | Discharge: 2024-03-12 | Disposition: A | Discharge status: Home / Self Care | Payer: Self-pay | Attending: Radiation Oncology | Admitting: Radiation Oncology

## 2024-03-12 DIAGNOSIS — C73 Malignant neoplasm of thyroid gland: Secondary | ICD-10-CM | POA: Diagnosis not present

## 2024-03-12 DIAGNOSIS — I639 Cerebral infarction, unspecified: Secondary | ICD-10-CM | POA: Diagnosis not present

## 2024-03-12 DIAGNOSIS — R490 Dysphonia: Secondary | ICD-10-CM | POA: Diagnosis not present

## 2024-03-12 DIAGNOSIS — Z510 Encounter for antineoplastic radiation therapy: Secondary | ICD-10-CM | POA: Diagnosis not present

## 2024-03-12 DIAGNOSIS — Z930 Tracheostomy status: Secondary | ICD-10-CM | POA: Diagnosis not present

## 2024-03-12 LAB — RAD ONC ARIA SESSION SUMMARY
Course Elapsed Days: 6
Plan Fractions Treated to Date: 5
Plan Prescribed Dose Per Fraction: 4.4 Gy
Plan Total Fractions Prescribed: 5
Plan Total Prescribed Dose: 22 Gy
Reference Point Dosage Given to Date: 22 Gy
Reference Point Session Dosage Given: 4.4 Gy
Session Number: 5

## 2024-03-12 LAB — GLUCOSE, CAPILLARY
Glucose-Capillary: 159 mg/dL — ABNORMAL HIGH (ref 70–99)
Glucose-Capillary: 163 mg/dL — ABNORMAL HIGH (ref 70–99)
Glucose-Capillary: 271 mg/dL — ABNORMAL HIGH (ref 70–99)
Glucose-Capillary: 243 mg/dL — ABNORMAL HIGH (ref 70–99)
Glucose-Capillary: 186 mg/dL — ABNORMAL HIGH (ref 70–99)

## 2024-03-12 NOTE — Progress Notes (Signed)
 Progress Note   Patient: Nancy Sutton FMW:[id number] DOB: August 15, 1946 DOA: 02/25/2024     16 DOS: the patient was seen and examined on 03/12/2024   Brief hospital course: 78 y.o female with past medical history of stroke with residual left hemianopia, hypertension, diabetes, CAD status post stent with history of thyroid  mass being followed by general surgery with plans for surgical intervention presented to hospital with difficulty clearing her throat, and worsening neck pain.  Patient was transferred from [facility] to [facility] for evaluation by general surgery.  During hospitalization patient was seen by ENT. Patient was seen by Dr. Darel with plan for tracheostomy 6/11, and open thyroid  biopsy for definitive histologic diagnosis, medical oncology, radiation oncology has been involved as well, as well Dr. Teo in communication with ENT at [facility].  Pathology showed anaplastic thyroid  carcinoma.  Patient was then transferred to transferring to [facility] for radiation.  Oncology and radiation oncology have been consulted.  Palliative care on board, discussed with patient, family,  agreed for home hospice after palliative radiation therapy on monday.  Assessment and plan: Anaplastic Thyroid  Carcinoma  s/p open thyroid  biopsy and tracheostomy -> changed to uncuffed tube per Dr. Karis. 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  CT scan of the abdomen chest and pelvis without any metastatic disease.  CT neck with irregular peripherally enhancing centrally hypodense lesion within the right thyroid  fossa with associated mild right cervical lymphadenopathy.  LN's centrally hypodense (suppurative or necrotic) (concerning for metastatic thyroid  cancer - see report).  Patient has been transferred to [facility] for possible radiation.  oncology (Pasam) and radiation oncology Audry) have been consulted, appreciate assistance.  Has been put on cortrak tube for tube feeding.  Palliative  care on board, discussed with patient, family,  agreed for home hospice after palliative radiation therapy on monday.   Systemic Inflammatory Response Syndrome Fever has improved.  Still has leukocytosis.  Chest x-ray without any obvious infiltrate.  UA with positive white cells and leukocyte esterase.  Follow-up urine culture blood culture.  Finished Levaquin  Flagyl  therapy.   Acute recurrent stroke History of stroke with left-sided hemianopsia.  MRI with patchy acute ischemic nonhemorrhagic right frontal, parietal, and occipital infarcts (watershed in distribution), chronic R PCA distribution infarct.  Loss of normal flow void within cavernous R ICA related to previously identified focal occlusion vs high grade stenosis.   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  Distal reconstitution via colateral flow eith via the ophthalmic artery or cross the circle of willis.  Favored to reflect severe high grade progressive stenosis. Echo with normal EF.  Hemoglobin A1c of 6.9.  Seen by neurology during hospitalization.  Okay to start DAPT as per ENT.  Continue statin. Speech therapy has recommended nectar thick liquids.  Continue PT OT.   Family decided to take her home with hospice care, awaiting hospice equipment arrangements. TOC on board. She had RT today.    CAD s/p PCI in March 2024.  Has seen her cardiologist and was cleared to undergo surgical procedure on 02/22/2024, continue Plavix , aspirin , statin.     Paroxysmal A-fib in 2024.  This was in conjunction of her NSTEMI.  Not on anticoagulation, follows with her cardiologist, continue low-dose Cardizem .   Dyslipidemia.  Continue combination of statin and Zetia.   Hypomagnesemia.  Replaced.  Latest magnesium  of 2.2   Hypokalemia.  Replenished.  Latest potassium of 4.2   Essential hypertension.  On Cardizem .  Blood pressure seems to be stable   DM type II   Latest hemoglobin A1c 12/2023 was 6.9 continue insulin  regimen.   Glaucoma Continue timolol .  Discussed with daughter at bedside regarding hospice arrangements. Likely delivery tonight.    Nursing supportive care. Fall, aspiration precautions. Diet:  Diet Orders (From admission, onward)     Start     Ordered   03/05/24 1044  Diet full liquid Room service appropriate? Yes; Fluid consistency: Nectar Thick  Diet effective now       Question Answer Comment  Room service appropriate? Yes   Fluid consistency: Nectar Thick      03/05/24 1043           DVT prophylaxis: heparin  injection 5,000 Units Start: 02/26/24 1400  Level of care: Telemetry   Code Status: Limited: Do not attempt resuscitation (DNR) -DNR-LIMITED -Do Not Intubate/DNI   Subjective: Patient is seen and examined today morning. Daughter at bedside. She is more awake, writing her concerns over dry erase board. Denies any complaints, wishes to go home.  Physical Exam: Vitals:   03/12/24 0359 03/12/24 0815 03/12/24 1049 03/12/24 1159  BP: (!) 145/41  (!) 127/44   Pulse: 73 76 78 79  Resp: 20 19 16 18   Temp: 98.7 F (37.1 C)  98.3 F (36.8 C)   TempSrc: Oral  Oral   SpO2: 97% 100% 98% 98%  Weight:      Height:        General - Elderly ill looking Caucasian female, awake, no distress HEENT - PERRLA, EOMI, atraumatic head, non tender sinuses. Lung - Clear, trach collar, diffuse rales, rhonchi, wheezes. Heart - S1, S2 heard, no murmurs, rubs, trace pedal edema. Abdomen - Soft, non tender, bowel sounds good Neuro - alert, awake, able to follow simple commands, non focal exam. Skin - Warm and dry.  Data Reviewed:      Latest Ref Rng & Units 03/07/2024    5:43 AM 03/06/2024    6:14 AM 03/05/2024    4:35 AM  CBC  WBC 4.0 - 10.5 K/uL 14.4  16.1  15.8   Hemoglobin 12.0 - 15.0 g/dL 9.4  9.6  89.8   Hematocrit 36.0 - 46.0 % 29.7  30.7  31.1   Platelets 150 - 400 K/uL 399  394  397       Latest Ref Rng &  Units 03/07/2024    5:43 AM 03/06/2024    6:14 AM 03/05/2024    4:35 AM  BMP  Glucose 70 - 99 mg/dL 702  749  622   BUN 8 - 23 mg/dL 45  45  36   Creatinine 0.44 - 1.00 mg/dL 9.11  9.05  8.98   Sodium 135 - 145 mmol/L 130  135  132   Potassium 3.5 - 5.1 mmol/L 4.9  4.2  4.4   Chloride 98 - 111 mmol/L 95  99  98   CO2 22 - 32 mmol/L 27  25  25    Calcium  8.9 - 10.3 mg/dL 9.2  9.3  8.8    No results found.  Family Communication: Discussed with patient's family, understand and agree. All questions answered.  Disposition: Status is: Inpatient Remains inpatient appropriate because: palliative radiation therapy.  Planned Discharge Destination: home with hospice awaiting hospice equipment delivered home.     Time spent: 37 minutes  Author: Concepcion Riser, MD 03/12/2024 2:26 PM Secure chat 7am to  7pm For on call review [email or web address].

## 2024-03-12 NOTE — Progress Notes (Signed)
 PT Cancellation Note  Patient Details Name: Nancy Sutton MRN: [id number] DOB: 05-20-1946   Cancelled Treatment:    Reason Eval/Treat Not Completed: Patient at procedure or test/unavailable (pt at radiation, will check back later.)   Sylvan Delon Copp PT 03/12/2024  Acute Rehabilitation Services  Office [phone]

## 2024-03-12 NOTE — Plan of Care (Signed)
  Problem: Education: Goal: Ability to describe self-care measures that may prevent or decrease complications (Diabetes Survival Skills Education) will improve Outcome: Progressing Goal: Individualized Educational Video(s) Outcome: Progressing   Problem: Coping: Goal: Ability to adjust to condition or change in health will improve Outcome: Progressing   Problem: Fluid Volume: Goal: Ability to maintain a balanced intake and output will improve Outcome: Progressing   Problem: Health Behavior/Discharge Planning: Goal: Ability to identify and utilize available resources and services will improve Outcome: Progressing Goal: Ability to manage health-related needs will improve Outcome: Progressing   Problem: Nutritional: Goal: Maintenance of adequate nutrition will improve Outcome: Progressing   Problem: Education: Goal: Knowledge of General Education information will improve Description: Including pain rating scale, medication(s)/side effects and non-pharmacologic comfort measures Outcome: Progressing   Problem: Clinical Measurements: Goal: Will remain free from infection Outcome: Progressing   Problem: Coping: Goal: Level of anxiety will decrease Outcome: Progressing   Problem: Elimination: Goal: Will not experience complications related to bowel motility Outcome: Progressing Goal: Will not experience complications related to urinary retention Outcome: Progressing   Problem: Pain Managment: Goal: General experience of comfort will improve and/or be controlled Outcome: Progressing   Problem: Safety: Goal: Ability to remain free from injury will improve Outcome: Progressing

## 2024-03-13 ENCOUNTER — Other Ambulatory Visit (HOSPITAL_COMMUNITY): Payer: Self-pay

## 2024-03-13 DIAGNOSIS — Z7984 Long term (current) use of oral hypoglycemic drugs: Secondary | ICD-10-CM | POA: Diagnosis not present

## 2024-03-13 DIAGNOSIS — E876 Hypokalemia: Secondary | ICD-10-CM | POA: Diagnosis present

## 2024-03-13 DIAGNOSIS — Z833 Family history of diabetes mellitus: Secondary | ICD-10-CM | POA: Diagnosis not present

## 2024-03-13 DIAGNOSIS — R490 Dysphonia: Secondary | ICD-10-CM | POA: Diagnosis present

## 2024-03-13 DIAGNOSIS — I10 Essential (primary) hypertension: Secondary | ICD-10-CM | POA: Diagnosis present

## 2024-03-13 DIAGNOSIS — Z66 Do not resuscitate: Secondary | ICD-10-CM | POA: Diagnosis not present

## 2024-03-13 DIAGNOSIS — Z7985 Long-term (current) use of injectable non-insulin antidiabetic drugs: Secondary | ICD-10-CM | POA: Diagnosis not present

## 2024-03-13 DIAGNOSIS — I252 Old myocardial infarction: Secondary | ICD-10-CM | POA: Diagnosis not present

## 2024-03-13 DIAGNOSIS — H5347 Heteronymous bilateral field defects: Secondary | ICD-10-CM | POA: Diagnosis present

## 2024-03-13 DIAGNOSIS — I480 Paroxysmal atrial fibrillation: Secondary | ICD-10-CM

## 2024-03-13 DIAGNOSIS — Z7902 Long term (current) use of antithrombotics/antiplatelets: Secondary | ICD-10-CM | POA: Diagnosis not present

## 2024-03-13 DIAGNOSIS — K219 Gastro-esophageal reflux disease without esophagitis: Secondary | ICD-10-CM | POA: Diagnosis present

## 2024-03-13 DIAGNOSIS — Z794 Long term (current) use of insulin: Secondary | ICD-10-CM | POA: Diagnosis not present

## 2024-03-13 DIAGNOSIS — E7800 Pure hypercholesterolemia, unspecified: Secondary | ICD-10-CM | POA: Diagnosis present

## 2024-03-13 DIAGNOSIS — I2510 Atherosclerotic heart disease of native coronary artery without angina pectoris: Secondary | ICD-10-CM | POA: Diagnosis present

## 2024-03-13 DIAGNOSIS — G8194 Hemiplegia, unspecified affecting left nondominant side: Secondary | ICD-10-CM | POA: Diagnosis not present

## 2024-03-13 DIAGNOSIS — Z515 Encounter for palliative care: Secondary | ICD-10-CM | POA: Diagnosis not present

## 2024-03-13 DIAGNOSIS — R29706 NIHSS score 6: Secondary | ICD-10-CM | POA: Diagnosis not present

## 2024-03-13 DIAGNOSIS — D72829 Elevated white blood cell count, unspecified: Secondary | ICD-10-CM | POA: Diagnosis not present

## 2024-03-13 DIAGNOSIS — R6510 Systemic inflammatory response syndrome (SIRS) of non-infectious origin without acute organ dysfunction: Secondary | ICD-10-CM | POA: Diagnosis not present

## 2024-03-13 DIAGNOSIS — E1065 Type 1 diabetes mellitus with hyperglycemia: Secondary | ICD-10-CM | POA: Diagnosis present

## 2024-03-13 DIAGNOSIS — I214 Non-ST elevation (NSTEMI) myocardial infarction: Secondary | ICD-10-CM | POA: Diagnosis not present

## 2024-03-13 DIAGNOSIS — R710 Precipitous drop in hematocrit: Secondary | ICD-10-CM | POA: Diagnosis not present

## 2024-03-13 DIAGNOSIS — I63231 Cerebral infarction due to unspecified occlusion or stenosis of right carotid arteries: Secondary | ICD-10-CM | POA: Diagnosis not present

## 2024-03-13 DIAGNOSIS — R221 Localized swelling, mass and lump, neck: Secondary | ICD-10-CM | POA: Diagnosis not present

## 2024-03-13 DIAGNOSIS — C73 Malignant neoplasm of thyroid gland: Secondary | ICD-10-CM | POA: Diagnosis present

## 2024-03-13 LAB — GLUCOSE, CAPILLARY
Glucose-Capillary: 126 mg/dL — ABNORMAL HIGH (ref 70–99)
Glucose-Capillary: 152 mg/dL — ABNORMAL HIGH (ref 70–99)
Glucose-Capillary: 155 mg/dL — ABNORMAL HIGH (ref 70–99)
Glucose-Capillary: 170 mg/dL — ABNORMAL HIGH (ref 70–99)
Glucose-Capillary: 207 mg/dL — ABNORMAL HIGH (ref 70–99)
Glucose-Capillary: 209 mg/dL — ABNORMAL HIGH (ref 70–99)

## 2024-03-13 MED ORDER — ACETAMINOPHEN 160 MG/5ML PO SUSP
320.0000 mg | Freq: Four times a day (QID) | ORAL | 0 refills | Status: AC | PRN
  Filled 2024-03-13: qty 118, 3d supply, fill #0

## 2024-03-13 MED ORDER — LORAZEPAM 2 MG/ML PO CONC
1.0000 mg | ORAL | 0 refills | Status: AC | PRN
  Filled 2024-03-13: qty 30, 10d supply, fill #0

## 2024-03-13 MED ORDER — SENNOSIDES 8.6 MG PO TABS
2.0000 | ORAL_TABLET | Freq: Two times a day (BID) | ORAL | 0 refills | Status: AC
  Filled 2024-03-13: qty 28, 7d supply, fill #0

## 2024-03-13 MED ORDER — ALBUTEROL SULFATE (2.5 MG/3ML) 0.083% IN NEBU
2.5000 mg | INHALATION_SOLUTION | RESPIRATORY_TRACT | 0 refills | Status: AC | PRN
  Filled 2024-03-13: qty 150, 9d supply, fill #0

## 2024-03-13 MED ORDER — MORPHINE SULFATE (CONCENTRATE) 20 MG/ML PO SOLN
10.0000 mg | ORAL | 0 refills | Status: AC | PRN
  Filled 2024-03-13: qty 30, 10d supply, fill #0

## 2024-03-13 MED ORDER — DIPHENHYDRAMINE HCL 12.5 MG/5ML PO ELIX: 25.0000 mg | ORAL_SOLUTION | Freq: Once | ORAL | Status: DC

## 2024-03-13 NOTE — Progress Notes (Signed)
 Discharger medications delivered to patient at bedside [facility]

## 2024-03-13 NOTE — Plan of Care (Signed)
   Problem: Education: Goal: Ability to describe self-care measures that may prevent or decrease complications (Diabetes Survival Skills Education) will improve Outcome: Progressing Goal: Individualized Educational Video(s) Outcome: Progressing   Problem: Coping: Goal: Ability to adjust to condition or change in health will improve Outcome: Progressing

## 2024-03-13 NOTE — TOC Progression Note (Signed)
 Transition of Care [facility]) - Progression Note    Patient Details  Name: Nancy Sutton MRN: [id number] Date of Birth: 19-Dec-1945  [facility] CM/SW Contact  Toy LITTIE Agar, RN Phone Number:[phone]  03/13/2024, 11:34 AM  Clinical Narrative:    CM followed up with [facility] to determine if DME has been delivered for discharge. Per Quandra (bed, O2,  O2 moisturizer) have been delivered. When [place] spoke with son the son inquired about suction set up. Myrene has obtained orders and ordered set up through [facility]. Per [facility] is checking to see in they have the correct size for the catheter suction. The catheters may have to be ordered. CM has message MD to question if patient can discharge prior to delivery. Per MD patient will need to wait until suction set up in in the home. CM has made Quante with [facility] aware.   Per [facility] can delivery suction set up and catheters today on the evening rounds. CM has spoke with Gilmer Ade who confirms that he has spoken with [employer] and is expecting evening delivery between ( 2-4pm). CM has verified address for transport and made son aware of need for family to call CM or bedside RN to update on delivery. Once delivery is confirmed TOC will proceed with d/c . D/c packet has been placed at the nurses station. Son has also been provided with number to nurses station. If CM has gone for the day nursing will need to call [facility]. [phone] after 5pm [phone]        Expected Discharge Plan and Services         Expected Discharge Date: 03/13/24                                     Social Determinants of Health (SDOH) Interventions SDOH Screenings   Food Insecurity: No Food Insecurity (02/25/2024)  Housing: Low Risk  (02/25/2024)  Transportation Needs: No Transportation Needs (02/25/2024)  Utilities: Not At Risk (02/25/2024)  Social Connections: Unknown  (02/25/2024)  Tobacco Use: Low Risk  (02/29/2024)  Recent Concern: Tobacco Use - Medium Risk (02/21/2024)   Received from [facility]    Readmission Risk Interventions     No data to display

## 2024-03-13 NOTE — Discharge Summary (Signed)
 Physician Discharge Summary   Patient: Nancy Sutton MRN: [id number] DOB: July 12, 1946  Admit date:     02/25/2024  Discharge date: 03/13/24  Discharge Physician: Concepcion Riser   PCP: Bertell Satterfield, MD   Recommendations at discharge:  {Tip this will not be part of the note when signed- Example include specific recommendations for outpatient follow-up, pending tests to follow-up on. (Optional):26781}  Hospice care only.  Discharge Diagnoses: Principal Problem:   Neck mass Active Problems:   Hyperlipidemia   GERD (gastroesophageal reflux disease)   Cerebral infarction (HCC)   Uncontrolled type 1 diabetes mellitus with hyperglycemia (HCC)   NSTEMI (non-ST elevated myocardial infarction) (HCC)   Paroxysmal atrial fibrillation (HCC)   CAD (coronary artery disease)   Anaplastic thyroid  carcinoma (HCC)   Hoarseness   Thyroid  cancer (HCC)   Tracheostomy status (HCC)  Resolved Problems:   * No resolved hospital problems. *  Hospital Course: 78 y.o female with past medical history of stroke with residual left hemianopia, hypertension, diabetes, CAD status post stent with history of thyroid  mass being followed by general surgery with plans for surgical intervention presented to hospital with difficulty clearing her throat, and worsening neck pain.  [facility] was transferred [facility] to [facility] for evaluation by general surgery.  During hospitalization patient was seen by ENT. Patient was seen by Dr. Darel with plan for tracheostomy 6/11, and open thyroid  biopsy for definitive histologic diagnosis, medical oncology, radiation oncology has been involved as well, as well Dr. Teo in communication with ENT at [facility].  Pathology showed anaplastic thyroid  carcinoma.  Patient was then transferred to [facility] for radiation.  Oncology and radiation oncology have been consulted.  Palliative discussed with patient, family,  agreed for home hospice after palliative radiation therapy. She is  awaiting DME equipment delivery at home.   Assessment and plan: Anaplastic Thyroid  Carcinoma s/p open thyroid  biopsy and tracheostomy -> changed to uncuffed tube per Dr. Karis. 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  CT scan of the abdomen chest and pelvis without any metastatic disease. CT neck with irregular peripherally enhancing centrally hypodense lesion within the right thyroid  fossa with associated mild right cervical lymphadenopathy.  LN's centrally hypodense (suppurative or necrotic) (concerning for metastatic thyroid  cancer - see report).  Patient has been transferred to [facility] long for palliative radiation therapy. Oncology (Pasam) and radiation oncology Audry) have been consulted, appreciate assistance.  Has been put on cortrak tube for tube feeding.   Palliative care on board, discussed with patient, family, agreed for home hospice after palliative radiation therapy on monday. She is awaiting hospice DME to be delivered home.   Systemic Inflammatory Response Syndrome Fever has improved.  Still has leukocytosis.  Chest x-ray without any obvious infiltrate.  UA with positive white cells and leukocyte esterase.  Follow-up urine culture blood culture.  Finished Levaquin  Flagyl  therapy.   Acute recurrent stroke History of stroke with left-sided hemianopsia.  MRI with patchy acute ischemic nonhemorrhagic right frontal, parietal, and occipital infarcts (watershed in distribution), chronic R PCA distribution infarct.  Loss of normal flow void within cavernous R ICA related to previously identified focal occlusion vs high grade stenosis.   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  Distal reconstitution via colateral flow eith via the ophthalmic artery or cross the circle of willis.  Favored to reflect severe high grade progressive stenosis. Echo with normal EF.  Hemoglobin  A1c of 6.9.  Seen by neurology during hospitalization, started DAPT as cleared per ENT.  Continued on statin. Speech therapy has recommended nectar thick liquids.  Palliative care discussed with family who understand her poor prognosis decided to take her home with hospice care, awaiting hospice equipment arrangements at home.   CAD s/p PCI in March 2024.  Has seen her cardiologist and was cleared to undergo surgical procedure on 02/22/2024. As she is hospice care only stopped Plavix , aspirin , statin.     Paroxysmal A-fib in 2024.  This was in conjunction of her NSTEMI.  Not on anticoagulation, stopped low-dose Cardizem  as family prefer palliative care only.   Dyslipidemia.  Continue combination of statin and Zetia. Hypomagnesemia.  Replaced.  Latest magnesium  of 2.2 Hypokalemia.  Replenished.  Latest potassium of 4.2 Essential hypertension.  Blood pressure seems to be stable DM type II  Latest hemoglobin A1c 12/2023 was 6.9. no sliding scale or oral meds recommended.  Discussed with husband at bedside. Her feeding tube will be removed. All her home meds stopped. Advised only meds for her symptoms. Home hospice is set up. DME will be delivered home today evening when she can go home per TOC.    {Tip this will not be part of the note when signed Body mass index is 23.16 kg/m. ,  Nutrition Documentation    Flowsheet Row ED to Hosp-Admission (Current) from 02/25/2024 in [facility]  Nutrition Problem Inadequate oral intake  Etiology dysphagia  Nutrition Goal Patient will meet greater than or equal to 90% of their needs  ,  (Optional):26781}  {(NOTE) Pain control PDMP Statment (Optional):26782} Consultants: Oncology, ENT, radiation oncology, neurology. Procedures performed: tracheostomy  Disposition: Hospice care Diet recommendation:  Discharge Diet Orders (From admission, onward)     Start     Ordered   03/13/24 0000  Diet general       Comments: Pleasure feeds    03/13/24 1031           NPO pleasure feeds, nectar thick liquids. DISCHARGE MEDICATION: Allergies as of 03/13/2024       Reactions   Ampicillin Hives   Breaks out in bumps Has patient had a PCN reaction causing immediate rash, facial/tongue/throat swelling, SOB or lightheadedness with hypotension: No Has patient had a PCN reaction causing severe rash involving mucus membranes or skin necrosis: Yes Has patient had a PCN reaction that required hospitalization: No Has patient had a PCN reaction occurring within the last 10 years: No If all of the above answers are NO, then may proceed with Cephalosporin use.   Bromday [bromfenac Sodium] Itching, Swelling   Eye Drops   Lisinopril Cough   Tetanus Toxoids Hives   Breaks out in bumps        Medication List     STOP taking these medications    acetaminophen  650 MG CR tablet Commonly known as: TYLENOL  Replaced by: Acetaminophen  Childrens 160 MG/5ML Susp   ascorbic acid 500 MG tablet Commonly known as: VITAMIN C   calcium  carbonate 500 MG chewable tablet Commonly known as: TUMS - dosed in mg elemental calcium    cholecalciferol 25 MCG (1000 UNIT) tablet Commonly known as: VITAMIN D3   clopidogrel  75 MG tablet Commonly known as: PLAVIX    diltiazem  30 MG tablet Commonly known as: Cardizem    ezetimibe 10 MG tablet Commonly known as: ZETIA   metFORMIN  500 MG 24 hr tablet Commonly known as: GLUCOPHAGE -XR   naproxen sodium 220 MG tablet  Commonly known as: ALEVE   NovoLOG  FlexPen 100 UNIT/ML FlexPen Generic drug: insulin  aspart   omeprazole  40 MG capsule Commonly known as: PRILOSEC   ONE-A-DAY 55 PLUS PO   PROBIOTIC DAILY PO   rosuvastatin  40 MG tablet Commonly known as: CRESTOR    Tresiba  FlexTouch 200 UNIT/ML FlexTouch Pen Generic drug: insulin  degludec   Trulicity  1.5 MG/0.5ML Soaj Generic drug: Dulaglutide    vitamin E 180 MG (400 UNITS) capsule       TAKE these medications    Acetaminophen   Childrens 160 MG/5ML Susp Commonly known as: Tylenol  Childrens Take 10 mLs (320 mg total) by mouth every 6 (six) hours as needed for mild pain (pain score 1-3), fever or headache. Replaces: acetaminophen  650 MG CR tablet   albuterol (2.5 MG/3ML) 0.083% nebulizer solution Commonly known as: PROVENTIL Take 3 mLs (2.5 mg total) by nebulization every 4 (four) hours as needed for wheezing or shortness of breath.   latanoprost  0.005 % ophthalmic solution Commonly known as: XALATAN  Place 1 drop into both eyes at bedtime.   LORazepam  2 MG/ML concentrated solution Commonly known as: ATIVAN  Take 0.5 mLs (1 mg total) by mouth every 4 (four) hours as needed for anxiety, sedation or sleep.   morphine  CONCENTRATE 10 mg / 0.5 ml concentrated solution Take 0.5 mLs (10 mg total) by mouth every 4 (four) hours as needed for up to 5 days for severe pain (pain score 7-10). May give sublingually if needed.   senna 8.6 MG Tabs tablet Commonly known as: Senokot Take 2 tablets (17.2 mg total) by mouth 2 (two) times daily. May crush, mix with water  and give sublingually if needed.   timolol  0.5 % ophthalmic solution Commonly known as: BETIMOL  Place 1 drop into both eyes 2 (two) times daily.        Discharge Exam: Filed Weights   03/10/24 0403 03/11/24 0706 03/13/24 0402  Weight: 66 kg 66.6 kg 53.8 kg   General - Elderly ill looking Caucasian female, awake, no distress HEENT - PERRLA, EOMI, atraumatic head, non tender sinuses. Lung - Clear, trach collar, diffuse rales, rhonchi, wheezes. Heart - S1, S2 heard, no murmurs, rubs, trace pedal edema. Abdomen - Soft, non tender, bowel sounds good Neuro - alert, awake, able to follow simple commands, non focal exam. Skin - Warm and dry.  Condition at discharge: poor  The results of significant diagnostics from this hospitalization (including imaging, microbiology, ancillary and laboratory) are listed below for reference.   Imaging Studies: DG CHEST  PORT 1 VIEW Result Date: 03/04/2024 CLINICAL DATA:  [id number] Fever [id number] EXAM: PORTABLE CHEST 1 VIEW COMPARISON:  February 28, 2024 FINDINGS: The cardiomediastinal silhouette is unchanged in contour.Enteric tube tip projects over the expected area of the proximal duodenum. Tracheostomy. Atherosclerotic calcifications. Enteric contrast delineates a loop of bowel in the LEFT upper quadrant, likely colon. No pleural effusion. No pneumothorax. No acute pleuroparenchymal abnormality. IMPRESSION: No acute cardiopulmonary abnormality. Electronically Signed   By: Corean Salter M.D.   On: 03/04/2024 09:08   DG Swallowing Func-Speech Pathology Result Date: 03/03/2024 Table formatting from the original result was not included. Modified Barium Swallow Study Patient Details Name: Nancy Sutton MRN: [id number] Date of Birth: November 28, 1945 Today's Date: 03/03/2024 HPI/PMH: HPI: 78 yo female presenting 6/7 with rapidly enlarging thyroid  mass. CT Neck showed 4.1 x 2.5 x 3.9 cm heterogeneous mass in the right anterior neck at  the level of the thyroid  concerning for residual/recurrent thyroid  malignancy. Associated mass effect and leftward shift of the  airway without significant airway narrowing. 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 Associated retropharyngeal edema. Trach done by ENT 6/11. Code stroke was called 6/13 due to L sided weakness although time of onset likely the day prior. BS completed on 6/12 recommending Dys 2, nectar thick liquids but patient the next day, was having difficulty swallowing and had new onset stroke symptoms. MRI showed Patchy acute ischemic nonhemorrhagic right frontal, parietal, and occipital infarcts, watershed in distribution. Patient made NPO awaiting reevaluation of swallowing with repeat MBS recommended. Large bore NG changed to small bore. PMH includes: GERD, stroke, Barrett's esophagus with  esophageal dysphagia s/p dilation, HLD, NSTEMI, PAF, CAD, HTN Clinical Impression: Clinical Impression: As compared to MBS completed on 03/01/24, MBS completed today appeared only mildly different. She continues with limited anterior hyoid movement, reduced laryngeal elevation and incomplete laryngeal vestibule closure. This leads to penetration of thin liquids and nectar thick liquids to level of vocal cords before spontaneously clearing from laryngeal vestibule (PAS 4) as well as an instance each of thin and nectar thick liquid material passing below the vocal cords before spontaneously clearing from laryngeal vestibule (PAS 6). Silent aspiration was observed with thin liquids. (PAS 8) The main difference in today's MBS as compared to MBS on 6/12 was increased delay in transit of puree solid and honey and nectar thick liquid boluses as they transited through pharynx and through PES. Patient was noticeably tired at beginning of study and did appear to become more fatigued as study progressed, at one point leaning her head against fluoroscopy machine. SLP is recommending continue NPO but allow to continue PRN ice chips as well as floor stock purees/puddings and floor stock nectar thick liquids. SLP will follow for full PO readiness. Factors that may increase risk of adverse event in presence of aspiration Noe & Lianne 2021): Factors that may increase risk of adverse event in presence of aspiration Noe & Lianne 2021): Weak cough; Presence of tubes (ETT, trach, NG, etc.); Frail or deconditioned Recommendations/Plan: Swallowing Evaluation Recommendations Swallowing Evaluation Recommendations Recommendations: NPO; Ice chips PRN after oral care Medication Administration: Crushed with puree Supervision: Patient able to self-feed; Staff to assist with self-feeding; Full supervision/cueing for swallowing strategies Swallowing strategies  : Place PMSV during PO intake; Slow rate; Small bites/sips Postural changes:  Position pt fully upright for meals Oral care recommendations: Oral care BID (2x/day) Treatment Plan Treatment Plan Treatment recommendations: Therapy as outlined in treatment plan below Follow-up recommendations: Outpatient SLP Functional status assessment: Patient has had a recent decline in their functional status and demonstrates the ability to make significant improvements in function in a reasonable and predictable amount of time. Treatment frequency: Min 2x/week Treatment duration: 2 weeks Interventions: Aspiration precaution training; Compensatory techniques; Patient/family education; Trials of upgraded texture/liquids; Diet toleration management by SLP; Respiratory muscle strength training Recommendations Recommendations for follow up therapy are one component of a multi-disciplinary discharge planning process, led by the attending physician.  Recommendations may be updated based on patient status, additional functional criteria and insurance authorization. Assessment: Orofacial Exam: Orofacial Exam Oral Cavity: Oral Hygiene: WFL Oral Cavity - Dentition: Adequate natural dentition Orofacial Anatomy: WFL Oral Motor/Sensory Function: WFL Anatomy: Anatomy: Suspected cervical osteophytes Boluses Administered: Boluses Administered Boluses Administered: Thin liquids (Level 0); Mildly thick liquids (Level 2, nectar thick); Moderately thick liquids (Level 3, honey thick); Puree  Oral Impairment Domain: Oral Impairment Domain Lip Closure: No labial escape Tongue control during bolus  hold: Cohesive bolus between tongue to palatal seal Bolus preparation/mastication: Timely and efficient chewing and mashing Bolus transport/lingual motion: Brisk tongue motion Oral residue: Complete oral clearance Location of oral residue : N/A Initiation of pharyngeal swallow : Valleculae  Pharyngeal Impairment Domain: Pharyngeal Impairment Domain Soft palate elevation: No bolus between soft palate (SP)/pharyngeal wall (PW) Laryngeal  elevation: Partial superior movement of thyroid  cartilage/partial approximation of arytenoids to epiglottic petiole Anterior hyoid excursion: Partial anterior movement Epiglottic movement: No inversion Laryngeal vestibule closure: Incomplete, narrow column air/contrast in laryngeal vestibule Pharyngeal stripping wave : Present - complete Pharyngeal contraction (A/P view only): N/A Pharyngoesophageal segment opening: Complete distension and complete duration, no obstruction of flow Tongue base retraction: Trace column of contrast or air between tongue base and PPW Pharyngeal residue: Collection of residue within or on pharyngeal structures Location of pharyngeal residue: Pyriform sinuses; Valleculae  Esophageal Impairment Domain: Esophageal Impairment Domain Esophageal clearance upright position: Complete clearance, esophageal coating Pill: No data recorded Penetration/Aspiration Scale Score: Penetration/Aspiration Scale Score 1.  Material does not enter airway: Puree 2.  Material enters airway, remains ABOVE vocal cords then ejected out: Moderately thick liquids (Level 3, honey thick) 3.  Material enters airway, remains ABOVE vocal cords and not ejected out: Mildly thick liquids (Level 2, nectar thick) 4.  Material enters airway, CONTACTS cords then ejected out: Thin liquids (Level 0); Mildly thick liquids (Level 2, nectar thick) 6.  Material enters airway, passes BELOW cords then ejected out: Thin liquids (Level 0); Mildly thick liquids (Level 2, nectar thick) 8.  Material enters airway, passes BELOW cords without attempt by patient to eject out (silent aspiration) : Thin liquids (Level 0) Compensatory Strategies: Compensatory Strategies Compensatory strategies: Yes Right head turn: Ineffective Ineffective Right Head Turn: Thin liquid (Level 0); Mildly thick liquid (Level 2, nectar thick)   General Information: Caregiver present: Yes  Diet Prior to this Study: NPO; Cortrak/Small bore NG tube   No data recorded   Respiratory Status: WFL   No data recorded  History of Recent Intubation: Yes  Behavior/Cognition: Alert; Cooperative; Pleasant mood; Lethargic/Drowsy Self-Feeding Abilities: Able to self-feed Baseline vocal quality/speech: Dysphonic Volitional Cough: Able to elicit Volitional Swallow: Able to elicit Exam Limitations: No limitations Goal Planning: Prognosis for improved oropharyngeal function: Good Barriers to Reach Goals: Overall medical prognosis No data recorded Patient/Family Stated Goal: patient requesting ice chips Consulted and agree with results and recommendations: Patient; Family member/caregiver; Nurse Pain: Pain Assessment Pain Assessment: Faces Faces Pain Scale: 2 Pain Location: burning in throat from coughing Pain Descriptors / Indicators: Burning Pain Intervention(s): Monitored during session End of Session: Start Time:SLP Start Time (ACUTE ONLY): 1255 Stop Time: SLP Stop Time (ACUTE ONLY): 1315 Time Calculation:SLP Time Calculation (min) (ACUTE ONLY): 20 min Charges: SLP Evaluations $ SLP Speech Visit: 1 Visit SLP Evaluations $MBS Swallow: 1 Procedure $ SLP EVAL LANGUAGE/SOUND PRODUCTION: 1 Procedure $Swallowing Treatment: 1 Procedure SLP visit diagnosis: SLP Visit Diagnosis: Dysphagia, oropharyngeal phase (R13.12) Past Medical History: Past Medical History: Diagnosis Date  Arthritis   CAD (coronary artery disease) 03/28/2023  NSTEMI 11/2022 s/p 3 x 16 mm DES to the [facility], 3 x 20 mm DES to the mRCA   - LHC 12/13/2022: RCA ostial 95, mid 90; LCx mid 70; LAD mid 40, distal 70>> Med Rx for LAD, LCx   - TTE 12/09/2022: EF 50-55, no RWMA, mild concentric LVH, GR 2 DD, normal RVSF, mild MR, RAP 15  Diabetes mellitus   since 2008  Essential hypertension, benign 09/10/2015  GERD (gastroesophageal reflux disease)   Hypercholesteremia   Hyperlipidemia   Stroke (HCC) 03/21/2011  only deficits-affected eyesight Past Surgical History: Past Surgical History: Procedure Laterality Date  BIOPSY  07/02/2022  Procedure:  BIOPSY;  Surgeon: Eartha Angelia Sieving, MD;  Location: [facility] SU[facility];  Service: Gastroenterology;;  CATARACT EXTRACTION W/PHACO  11/01/2011  Procedure: CATARACT EXTRACTION PHACO AND INTRAOCULAR LENS PLACEMENT (IOC);  Surgeon: Cherene Mania, MD;  Location: [facility] ORS;  Service: Ophthalmology;  Laterality: Right;  CDE 12.48  CORONARY STENT INTERVENTION N/A 12/10/2022  Procedure: CORONARY STENT INTERVENTION;  Surgeon: Wonda Sharper, MD;  [facility] INVASIVE CV LAB;  Service: Cardiovascular;  Laterality: N/A;  ESOPHAGEAL DILATION N/A 04/29/2017  Procedure: ESOPHAGEAL DILATION;  Surgeon: Golda Claudis PENNER, MD;  Location: [facility];  Service: Endoscopy;  Laterality: N/A;  ESOPHAGOGASTRODUODENOSCOPY N/A 04/29/2017  Procedure: ESOPHAGOGASTRODUODENOSCOPY (EGD);  Surgeon: Golda Claudis PENNER, MD;  Location: [facility];  Service: Endoscopy;  Laterality: N/A;  1015  ESOPHAGOGASTRODUODENOSCOPY (EGD) WITH ESOPHAGEAL DILATION N/A 02/14/2013  Procedure: ESOPHAGOGASTRODUODENOSCOPY (EGD) WITH ESOPHAGEAL DILATION;  Surgeon: Claudis PENNER Golda, MD;  Location: [facility];  Service: Endoscopy;  Laterality: N/A;  200-moved to 255 Ann to notify pt  ESOPHAGOGASTRODUODENOSCOPY (EGD) WITH PROPOFOL  N/A 05/28/2022  Procedure: ESOPHAGOGASTRODUODENOSCOPY (EGD) WITH PROPOFOL ;  Surgeon: Eartha Angelia Sieving, MD;  Location: [facility];  Service: Gastroenterology;  Laterality: N/A;  1030 ASA 2  ESOPHAGOGASTRODUODENOSCOPY (EGD) WITH PROPOFOL  N/A 07/02/2022  Procedure: ESOPHAGOGASTRODUODENOSCOPY (EGD) WITH PROPOFOL ;  Surgeon: Eartha Angelia Sieving, MD;  Location: [facility];  Service: Gastroenterology;  Laterality: N/A;  105 ASA 2, pt knows new time per Anette Caldron  EYE SURGERY  10-2010  left eye removal of cataract  LEFT HEART CATH AND CORONARY ANGIOGRAPHY N/A 12/10/2022  Procedure: LEFT HEART CATH AND CORONARY ANGIOGRAPHY;  Surgeon: Wonda Sharper, MD;  Location: [facility] INVASIVE CV LAB;  Service: Cardiovascular;  Laterality: N/A;  MASS BIOPSY  N/A 02/29/2024  Procedure: BIOPSY, THYROID ;  Surgeon: Karis Clunes, MD;  Location: [facility] OR;  Service: ENT;  Laterality: N/A;  TRACHEOSTOMY TUBE PLACEMENT N/A 02/29/2024  Procedure: CREATION, TRACHEOSTOMY;  Surgeon: Karis Clunes, MD;  Location: [facility] OR;  Service: ENT;  Laterality: N/A;  WITH THYROID  BIOPSY  TUBAL LIGATION  1984 Norleen IVAR Blase, MA, CCC-SLP Speech Therapy   MR BRAIN WO CONTRAST Result Date: 03/02/2024 CLINICAL DATA:  Follow-up examination for acute or deficit, stroke. EXAM: MRI HEAD WITHOUT CONTRAST TECHNIQUE: Multiplanar, multiecho pulse sequences of the brain and surrounding structures were obtained without intravenous contrast. COMPARISON:  Comparison made with CTs from earlier the same day. FINDINGS: Brain: Cerebral volume within normal limits. Encephalomalacia and gliosis involving the right occipital lobe, consistent with a chronic right PCA distribution infarct. Patchy restricted diffusion involving the cortical to subcortical aspect of the right frontal, parietal, and occipital lobes, consistent with acute ischemic nonhemorrhagic infarcts. These are watershed in distribution. No associated hemorrhage or significant mass effect. No other evidence for acute or subacute ischemia. No acute or chronic intracranial blood products. No mass lesion, midline shift or mass effect. Ex vacuo dilatation of the right lateral ventricle related to the chronic right PCA territory infarct without hydrocephalus. No extra-axial fluid collection. Pituitary gland within normal limits. Vascular: Loss of normal flow void within the cavernous right ICA related to the previously identified focal occlusion versus high-grade stenosis (series 5, image 8). Major intracranial vascular flow voids are otherwise maintained. Skull and upper cervical spine: Cranial junction within normal limits. Bone marrow signal intensity normal. No scalp  soft tissue abnormality. Sinuses/Orbits: Prior bilateral ocular lens replacement. Paranasal sinuses  are largely clear. No mastoid effusion. Other: None. IMPRESSION: 1. Patchy acute ischemic nonhemorrhagic right frontal, parietal, and occipital infarcts, watershed in distribution. 2. Chronic right PCA distribution infarct. 3. Loss of normal flow void within the cavernous right ICA related to the previously identified focal occlusion versus high-grade stenosis. Electronically Signed   By: Morene Hoard M.D.   On: 03/02/2024 19:08   ECHOCARDIOGRAM COMPLETE Result Date: 03/02/2024    ECHOCARDIOGRAM REPORT   Patient Name:   RADONNA BRACHER Date of Exam: 03/02/2024 Medical Rec #:  [id number]      Height:       60.0 in Accession #:    [id number]     Weight:       122.6 lb Date of Birth:  Jan 20, 1946      BSA:          1.516 m Patient Age:    78 years       BP:           175/60 mmHg Patient Gender: F              HR:           81 bpm. Exam Location:  Inpatient Procedure: 2D Echo, Color Doppler and Cardiac Doppler (Both Spectral and Color            Flow Doppler were utilized during procedure). Indications:    Stroke  History:        Patient has prior history of Echocardiogram examinations, most                 recent 12/09/2022. NSTEMI.  Sonographer:    Benard Stallion Referring Phys: [id number] ARSHAD N KAKRAKANDY IMPRESSIONS  1. Left ventricular ejection fraction, by estimation, is 55 to 60%. The left ventricle has normal function. The left ventricle has no regional wall motion abnormalities. Left ventricular diastolic parameters were normal.  2. Right ventricular systolic function is normal. The right ventricular size is normal. Tricuspid regurgitation signal is inadequate for assessing PA pressure.  3. The mitral valve is normal in structure. Trivial mitral valve regurgitation.  4. The aortic valve was not well visualized. Aortic valve regurgitation is not visualized. No aortic stenosis is present.  5. The inferior vena cava is dilated in size with >50% respiratory variability, suggesting right atrial pressure of 8  mmHg. FINDINGS  Left Ventricle: Left ventricular ejection fraction, by estimation, is 55 to 60%. The left ventricle has normal function. The left ventricle has no regional wall motion abnormalities. The left ventricular internal cavity size was normal in size. There is  no left ventricular hypertrophy. Left ventricular diastolic parameters were normal. Right Ventricle: The right ventricular size is normal. No increase in right ventricular wall thickness. Right ventricular systolic function is normal. Tricuspid regurgitation signal is inadequate for assessing PA pressure. Left Atrium: Left atrial size was normal in size. Right Atrium: Right atrial size was normal in size. Pericardium: There is no evidence of pericardial effusion. Mitral Valve: The mitral valve is normal in structure. Trivial mitral valve regurgitation. Tricuspid Valve: The tricuspid valve is normal in structure. Tricuspid valve regurgitation is trivial. Aortic Valve: The aortic valve was not well visualized. Aortic valve regurgitation is not visualized. No aortic stenosis is present. Aortic valve mean gradient measures 4.0 mmHg. Aortic valve peak gradient measures 7.3 mmHg. Aortic valve area, by VTI measures 2.00 cm. Pulmonic Valve: The pulmonic valve was not well  visualized. Pulmonic valve regurgitation is not visualized. Aorta: The aortic root is normal in size and structure. Venous: The inferior vena cava is dilated in size with greater than 50% respiratory variability, suggesting right atrial pressure of 8 mmHg. IAS/Shunts: The interatrial septum was not well visualized.  LEFT VENTRICLE PLAX 2D LVIDd:         3.97 cm   Diastology LVIDs:         3.15 cm   LV e' medial:    9.57 cm/s LV PW:         0.83 cm   LV E/e' medial:  10.6 LV IVS:        0.83 cm   LV e' lateral:   13.30 cm/s LVOT diam:     1.70 cm   LV E/e' lateral: 7.6 LV SV:         54 LV SV Index:   36 LVOT Area:     2.27 cm  RIGHT VENTRICLE RV Basal diam:  3.10 cm RV Mid diam:    2.80  cm RV S prime:     11.70 cm/s TAPSE (M-mode): 2.1 cm LEFT ATRIUM             Index        RIGHT ATRIUM           Index LA diam:        3.20 cm 2.11 cm/m   RA Area:     12.00 cm LA Vol (A2C):   43.5 ml 28.69 ml/m  RA Volume:   26.10 ml  17.22 ml/m LA Vol (A4C):   30.6 ml 20.19 ml/m LA Biplane Vol: 36.8 ml 24.28 ml/m  AORTIC VALVE AV Area (Vmax):    1.80 cm AV Area (Vmean):   1.87 cm AV Area (VTI):     2.00 cm AV Vmax:           135.00 cm/s AV Vmean:          89.800 cm/s AV VTI:            0.270 m AV Peak Grad:      7.3 mmHg AV Mean Grad:      4.0 mmHg LVOT Vmax:         107.00 cm/s LVOT Vmean:        73.900 cm/s LVOT VTI:          0.238 m LVOT/AV VTI ratio: 0.88  AORTA Ao Root diam: 2.40 cm MITRAL VALVE MV Area (PHT): 4.31 cm     SHUNTS MV Decel Time: 176 msec     Systemic VTI:  0.24 m MV E velocity: 101.00 cm/s  Systemic Diam: 1.70 cm MV A velocity: 128.00 cm/s MV E/A ratio:  0.79 Lonni Nanas MD Electronically signed by Lonni Nanas MD Signature Date/Time: 03/02/2024/11:11:31 AM    Final    CT ANGIO HEAD NECK W WO CM W PERF (CODE STROKE) Result Date: 03/02/2024 CLINICAL DATA:  Initial evaluation for acute neuro deficit. History of thyroid  cancer. EXAM: CT ANGIOGRAPHY HEAD AND NECK CT PERFUSION BRAIN TECHNIQUE: Multidetector CT imaging of the head and neck was performed using the standard protocol during bolus administration of intravenous contrast. Multiplanar CT image reconstructions and MIPs were obtained to evaluate the vascular anatomy. Carotid stenosis measurements (when applicable) are obtained utilizing NASCET criteria, using the distal internal carotid diameter as the denominator. Multiphase CT imaging of the brain was performed following IV bolus contrast injection. Subsequent parametric perfusion maps were calculated using  RAPID software. RADIATION DOSE REDUCTION: This exam was performed according to the departmental dose-optimization program which includes automated exposure  control, adjustment of the mA and/or kV according to patient size and/or use of iterative reconstruction technique. CONTRAST:  OMNIPAQUE  IOHEXOL  350 MG/ML SOLN COMPARISON:  Comparison made with CT from earlier the same day as well as prior neck CT from 02/28/2024. FINDINGS: CTA NECK FINDINGS Aortic arch: Visualized aortic arch within normal limits for caliber with standard 3 vessel morphology. Moderate aortic atherosclerosis. No significant stenosis about the origin the great vessels. Right carotid system: Right common and internal carotid arteries are patent without dissection. Atheromatous change about the right carotid bulb without hemodynamically significant greater than 50% stenosis. Left carotid system: Left common and internal carotid arteries are patent without dissection. No hemodynamically significant stenosis about the left carotid artery system. Vertebral arteries: Both vertebral arteries arise from subclavian arteries. Vertebral arteries patent without stenosis or dissection. Skeleton: No worrisome osseous lesions. Mild-to-moderate multilevel spondylosis noted within the visualized cervicothoracic spine. Other neck: Nasogastric tube in place. Heterogeneous enhancing ill-defined mass positioned at the right thyroid  fossa again seen, measuring approximately 3.9 x 2.8 x 3.2 cm. Multiple abnormal and partially necrotic nodes within the adjacent left neck, concerning for nodal metastatic disease. Changes are relatively similar as compared to recent neck CT. Interval placement of a tracheostomy with tip well positioned in the upper airway. Small volume secretions noted within subglottic trachea distal to the tracheostomy tube. Upper chest: No other acute finding. Review of the MIP images confirms the above findings CTA HEAD FINDINGS Anterior circulation: Atheromatous change seen about the carotid siphons bilaterally. 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 The right ICA is functionally occluded at this level. Distal reconstitution via collateral flow either via the ophthalmic artery or cross the circle-of-Willis. Upon review of prior MRA from 2012, there appears to have been a moderate to severe stenosis at this location. Therefore, finding is favored to reflect a severe high-grade progressive stenosis. Acute superimposed intraluminal thrombus is difficult to exclude. On the left, atheromatous change about the left siphon with resultant moderate to severe stenosis at the para clinoid region (series 10, image 92). A1 segments are diminutive but patent. Anterior communicating artery complex within normal limits. Both ACAs patent to their distal aspects without stenosis. Right ACA dominant. M1 segments patent without stenosis. No proximal SCA branch occlusion. Distal MCA branches perfused and fairly symmetric. Posterior circulation: Both V4 segments patent without stenosis. Left vertebral artery dominant. Both PICA patent. Basilar patent without stenosis. Superior cerebral arteries patent bilaterally. Left PCA primarily supplied via the basilar. Atheromatous change about the left PCA with moderate proximal left P2 stenosis (series 18, image 21). The right PCA is diffusely irregular and attenuated, in keeping with the chronic right PCA territory infarct. Right PCA remains patent to its distal aspect. Venous sinuses: Patent allowing for timing the contrast bolus. Anatomic variants: As above. No visible aneurysm. Review of the MIP images confirms the above findings CT Brain Perfusion Findings: ASPECTS: 10 CBF (<30%) Volume: 0mL Perfusion (Tmax>6.0s) volume: 43mL Mismatch Volume: 43 mL Infarction Location:43 mL areas of delayed perfusion involving the right cerebral hemisphere, somewhat watershed distribution. Finding presumably related to functional occlusion/severe stenosis at the cavernous right ICA as detailed above. No acute  core infarct. No other perfusion abnormality IMPRESSION: 1. 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 Distal reconstitution via collateral flow either via the ophthalmic artery or cross the circle-of-Willis. Upon review of prior MRA from 2012, there appears to have been a moderate to severe stenosis at this location. Therefore, this finding is favored to reflect a severe high-grade progressive stenosis although acutely superimposed intraluminal thrombus is difficult to exclude. 2. 43 mL areas of delayed perfusion involving the right cerebral hemisphere, somewhat watershed and distribution. Finding is in keeping with the above described high-grade stenosis/occlusion. No acute core infarct by CT perfusion. 3. 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 4. Diffusely irregular and attenuated right PCA, in keeping with the chronic right PCA territory infarct. 5. Heterogeneous enhancing mass at the right thyroid  fossa, presumably reflecting patient's known thyroid  carcinoma. Scattered partially necrotic lymph nodes within the adjacent right neck, concerning for nodal metastatic disease. Changes are relatively similar as compared to recent neck CT from 02/28/2024. 6. Interval placement of a tracheostomy tube with tip well positioned in the upper airway. 7.  Aortic Atherosclerosis (ICD10-I70.0). Results discussed by telephone at the time of interpretation on 03/02/2024 at 1:35 a.m. to provider ASHISH ARORA. Electronically Signed   By: Morene Hoard M.D.   On: 03/02/2024 02:05   CT HEAD CODE STROKE WO CONTRAST Result Date: 03/02/2024 CLINICAL DATA:  Code stroke. Initial evaluation for acute neuro deficit, stroke suspected. EXAM: CT HEAD WITHOUT CONTRAST TECHNIQUE: Contiguous axial images were obtained from the base of the skull through the vertex without intravenous contrast.  RADIATION DOSE REDUCTION: This exam was performed according to the departmental dose-optimization program which includes automated exposure control, adjustment of the mA and/or kV according to patient size and/or use of iterative reconstruction technique. COMPARISON:  None Available. FINDINGS: Brain: Cerebral volume within normal limits. Encephalomalacia involving the right occipital lobe, consistent with a chronic right PCA distribution infarct. No acute intracranial hemorrhage. No acute large vessel territory infarct. No mass lesion or midline shift. No hydrocephalus or extra-axial fluid collection. Vascular: No abnormal hyperdense vessel. Calcified atherosclerosis present at the skull base. Skull: Scalp soft tissues within normal limits.  Calvarium intact. Sinuses/Orbits: Globes orbital soft tissues demonstrate no acute finding. Paranasal sinuses are largely clear. No significant mastoid effusion. Nasogastric tube in place. Other: None. ASPECTS [facility] Stroke Program Early CT Score) - Ganglionic level infarction (caudate, lentiform nuclei, internal capsule, insula, M1-M3 cortex): 7 - Supraganglionic infarction (M4-M6 cortex): 3 Total score (0-10 with 10 being normal): 10 IMPRESSION: 1. No acute intracranial abnormality. 2. ASPECTS is 10. 3. Chronic right PCA territory infarct. These results were communicated to Dr. Arora at 1:00 am on 03/02/2024 by text page via the [facility] messaging system. Electronically Signed   By: Morene Hoard M.D.   On: 03/02/2024 01:01   DG Swallowing Func-Speech Pathology Result Date: 03/01/2024 Table formatting from the original result was not included. Modified Barium Swallow Study Patient Details Name: Nancy Sutton MRN: [id number] Date of Birth: Jul 17, 1946 Today's Date: 03/01/2024 HPI/PMH: HPI: 78 yo female presenting 6/7 with rapidly enlarging thyroid  mass. CT Neck showed 4.1 x 2.5 x 3.9 cm heterogeneous mass in the right anterior neck at  the level of the thyroid  concerning  for residual/recurrent thyroid  malignancy. Associated mass effect and leftward shift of the airway without significant airway narrowing. 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 Associated retropharyngeal edema. Trach done by ENT  6/11. PMH includes: GERD, stroke, Barrett's esophagus with esophageal dysphagia s/p dilation, HLD, NSTEMI, PAF, CAD, HTN Clinical Impression: Clinical Impression: Pt's oral phase is functional but she has pharyngeal dysphagia, largely characterized by inability to fully close off her airway. She has limited anterior hyoid movement, reduced laryngeal elevation, and with lighter boluses there is no epiglottic inversion (the weight of solid boluses help invert it a little bit, but it is still incomplete). Her laryngeal vestibule closure is incomplete, and liquids enter it during the swallow (or after from small amounts of residue, mostly caught in the valleculae). Aspiration occurs with thin liquids and is inconsistently sensed (PAS 7-8, usually a throat clear as opposed to a reflexive cough). A cued cough does not eject aspirates. A few postures and strategies were attempted but aspiration still occurred with a chin tuck. She did not aspirate when turning her head to the R or when using a supraglottic swallow, but she does still penetrate and she cannot clear penetrates. Pt has penetration that is more shallow with nectar (PAS 3) and honey thick liquids (PAS 2). Even though there is a little frank penetration with nectar thick liquids, almost all of it completely clears. Considering efficiency and safety with swallowing, recommend starting with Dys 2 diet and nectar thick liquids with PMV in place for all PO intake. SLP will f/u for potential implementation of water  protocol with use of strategies. Factors that may increase risk of adverse event in presence of aspiration Noe & Lianne  2021): Factors that may increase risk of adverse event in presence of aspiration Noe & Lianne 2021): Weak cough; Presence of tubes (ETT, trach, NG, etc.) Recommendations/Plan: Swallowing Evaluation Recommendations Swallowing Evaluation Recommendations Recommendations: PO diet PO Diet Recommendation: Dysphagia 2 (Finely chopped); Mildly thick liquids (Level 2, nectar thick) Liquid Administration via: Cup; Straw Medication Administration: Crushed with puree Supervision: Patient able to self-feed; Intermittent supervision/cueing for swallowing strategies Swallowing strategies  : Place PMSV during PO intake; Slow rate; Small bites/sips Postural changes: Position pt fully upright for meals Oral care recommendations: Oral care BID (2x/day) Caregiver Recommendations: Avoid jello, ice cream, thin soups, popsicles; Remove water  pitcher; Have oral suction available Treatment Plan Treatment Plan Follow-up recommendations: Outpatient SLP Functional status assessment: Patient has had a recent decline in their functional status and demonstrates the ability to make significant improvements in function in a reasonable and predictable amount of time. Treatment frequency: Min 2x/week Treatment duration: 2 weeks Interventions: Aspiration precaution training; Compensatory techniques; Patient/family education; Trials of upgraded texture/liquids; Diet toleration management by SLP; Respiratory muscle strength training Recommendations Recommendations for follow up therapy are one component of a multi-disciplinary discharge planning process, led by the attending physician.  Recommendations may be updated based on patient status, additional functional criteria and insurance authorization. Assessment: Orofacial Exam: Orofacial Exam Oral Cavity: Oral Hygiene: WFL Oral Cavity - Dentition: Adequate natural dentition Orofacial Anatomy: WFL Oral Motor/Sensory Function: WFL Anatomy: Anatomy: Suspected cervical osteophytes Boluses Administered:  Boluses Administered Boluses Administered: Thin liquids (Level 0); Mildly thick liquids (Level 2, nectar thick); Moderately thick liquids (Level 3, honey thick); Puree; Solid  Oral Impairment Domain: Oral Impairment Domain Lip Closure: No labial escape Tongue control during bolus hold: Cohesive bolus between tongue to palatal seal Bolus preparation/mastication: Timely and efficient chewing and mashing Bolus transport/lingual motion: Brisk tongue motion Oral residue: Complete oral clearance Location of oral residue : N/A Initiation of pharyngeal swallow : Valleculae  Pharyngeal Impairment Domain: Pharyngeal Impairment Domain Soft palate elevation: No bolus between soft  palate (SP)/pharyngeal wall (PW) Laryngeal elevation: Partial superior movement of thyroid  cartilage/partial approximation of arytenoids to epiglottic petiole Anterior hyoid excursion: Partial anterior movement Epiglottic movement: No inversion Laryngeal vestibule closure: Incomplete, narrow column air/contrast in laryngeal vestibule Pharyngeal stripping wave : Present - complete Pharyngeal contraction (A/P view only): N/A Pharyngoesophageal segment opening: Complete distension and complete duration, no obstruction of flow Tongue base retraction: Trace column of contrast or air between tongue base and PPW Pharyngeal residue: Collection of residue within or on pharyngeal structures Location of pharyngeal residue: Pyriform sinuses; Valleculae  Esophageal Impairment Domain: Esophageal Impairment Domain Esophageal clearance upright position: Esophageal retention with retrograde flow through the PES (x1 with thin liquisd during coughing) Pill: No data recorded Penetration/Aspiration Scale Score: Penetration/Aspiration Scale Score 1.  Material does not enter airway: Puree; Solid 2.  Material enters airway, remains ABOVE vocal cords then ejected out: Moderately thick liquids (Level 3, honey thick) 3.  Material enters airway, remains ABOVE vocal cords and not  ejected out: Mildly thick liquids (Level 2, nectar thick) 8.  Material enters airway, passes BELOW cords without attempt by patient to eject out (silent aspiration) : Thin liquids (Level 0) Compensatory Strategies: Compensatory Strategies Compensatory strategies: Yes Chin tuck: Ineffective Ineffective Chin Tuck: Thin liquid (Level 0) (trace aspiration still occurred) Right head turn: Ineffective Ineffective Right Head Turn: Thin liquid (Level 0) (did not aspirate, but still penetrated) Supraglottic swallow: Ineffective Ineffective Supraglottic Swallow: Thin liquid (Level 0) (did not aspirate, but still penetrated)   General Information: Caregiver present: Yes (son)  Diet Prior to this Study: NPO; Large bore NG tube   Temperature : Normal   Respiratory Status: WFL   Supplemental O2: Trach   History of Recent Intubation: Yes  Behavior/Cognition: Alert; Cooperative; Pleasant mood Self-Feeding Abilities: Able to self-feed Baseline vocal quality/speech: Normal Volitional Cough: Able to elicit Volitional Swallow: Able to elicit Exam Limitations: No limitations Goal Planning: Prognosis for improved oropharyngeal function: Good Barriers to Reach Goals: Overall medical prognosis (will need to see what her POC may be related to thyroid  mass) No data recorded Patient/Family Stated Goal: none stated Consulted and agree with results and recommendations: Patient; Family member/caregiver; Nurse Pain: Pain Assessment Pain Assessment: Faces Faces Pain Scale: 0 End of Session: Start Time:SLP Start Time (ACUTE ONLY): 1346 Stop Time: SLP Stop Time (ACUTE ONLY): 1406 Time Calculation:SLP Time Calculation (min) (ACUTE ONLY): 20 min Charges: SLP Evaluations $ SLP Speech Visit: 1 Visit SLP Evaluations $BSS Swallow: 1 Procedure $MBS Swallow: 1 Procedure $ SLP Eval Voice Prosthetic Device: Procedure $$ Passy Muir Speaking Valve: yes SLP visit diagnosis: SLP Visit Diagnosis: Dysphagia, pharyngeal phase (R13.13) Past Medical History: Past  Medical History: Diagnosis Date  Arthritis   CAD (coronary artery disease) 03/28/2023  NSTEMI 11/2022 s/p 3 x 16 mm DES to the [facility], 3 x 20 mm DES to the mRCA   - LHC 12/13/2022: RCA ostial 95, mid 90; LCx mid 70; LAD mid 40, distal 70>> Med Rx for LAD, LCx   - TTE 12/09/2022: EF 50-55, no RWMA, mild concentric LVH, GR 2 DD, normal RVSF, mild MR, RAP 15  Diabetes mellitus   since 2008  Essential hypertension, benign 09/10/2015  GERD (gastroesophageal reflux disease)   Hypercholesteremia   Hyperlipidemia   Stroke (HCC) 03/21/2011  only deficits-affected eyesight Past Surgical History: Past Surgical History: Procedure Laterality Date  BIOPSY  07/02/2022  Procedure: BIOPSY;  Surgeon: Eartha Angelia Sieving, MD;  Location: [facility];  Service: Gastroenterology;;  CATARACT EXTRACTION W/PHACO  11/01/2011  Procedure: CATARACT EXTRACTION PHACO AND INTRAOCULAR LENS PLACEMENT (IOC);  Surgeon: Cherene Mania, MD;  Location: [facility] OR[facility];  Service: Ophthalmology;  Laterality: Right;  CDE 12.48  CORONARY STENT INTERVENTION N/A 12/10/2022  Procedure: CORONARY STENT INTERVENTION;  Surgeon: Wonda Sharper, MD;  Location: [facility] INVASIVE CV LAB;  Service: Cardiovascular;  Laterality: N/A;  ESOPHAGEAL DILATION N/A 04/29/2017  Procedure: ESOPHAGEAL DILATION;  Surgeon: Golda Claudis PENNER, MD;  Location: [facility];  Service: Endoscopy;  Laterality: N/A;  ESOPHAGOGASTRODUODENOSCOPY N/A 04/29/2017  Procedure: ESOPHAGOGASTRODUODENOSCOPY (EGD);  Surgeon: Golda Claudis PENNER, MD;  Location: [facility];  Service: Endoscopy;  Laterality: N/A;  1015  ESOPHAGOGASTRODUODENOSCOPY (EGD) WITH ESOPHAGEAL DILATION N/A 02/14/2013  Procedure: ESOPHAGOGASTRODUODENOSCOPY (EGD) WITH ESOPHAGEAL DILATION;  Surgeon: Claudis PENNER Golda, MD;  Location: [facility];  Service: Endoscopy;  Laterality: N/A;  200-moved to 255 Ann to notify pt  ESOPHAGOGASTRODUODENOSCOPY (EGD) WITH PROPOFOL  N/A 05/28/2022  Procedure: ESOPHAGOGASTRODUODENOSCOPY (EGD) WITH PROPOFOL ;  Surgeon:  Eartha Angelia Sieving, MD;  Location: [facility];  Service: Gastroenterology;  Laterality: N/A;  1030 ASA 2  ESOPHAGOGASTRODUODENOSCOPY (EGD) WITH PROPOFOL  N/A 07/02/2022  Procedure: ESOPHAGOGASTRODUODENOSCOPY (EGD) WITH PROPOFOL ;  Surgeon: Eartha Angelia Sieving, MD;  Location: [facility];  Service: Gastroenterology;  Laterality: N/A;  105 ASA 2, pt knows new time per Anette Caldron  EYE SURGERY  10-2010  left eye removal of cataract  LEFT HEART CATH AND CORONARY ANGIOGRAPHY N/A 12/10/2022  Procedure: LEFT HEART CATH AND CORONARY ANGIOGRAPHY;  Surgeon: Wonda Sharper, MD;  Location: [facility] INVASIVE CV LAB;  Service: Cardiovascular;  Laterality: N/A;  MASS BIOPSY N/A 02/29/2024  Procedure: BIOPSY, THYROID ;  Surgeon: Karis Clunes, MD;  Location: [facility] OR;  Service: ENT;  Laterality: N/A;  TRACHEOSTOMY TUBE PLACEMENT N/A 02/29/2024  Procedure: CREATION, TRACHEOSTOMY;  Surgeon: Karis Clunes, MD;  Location: [facility] OR;  Service: ENT;  Laterality: N/A;  WITH THYROID  BIOPSY  TUBAL LIGATION  1984 Leita SAILOR., M.A. CCC-SLP Acute Rehabilitation Services Office: [phone] Secure chat preferred 03/01/2024, 3:23 PM  DG Abd Portable 1V Result Date: 02/29/2024 CLINICAL DATA:  NG placement. EXAM: PORTABLE ABDOMEN - 1 VIEW COMPARISON:  None Available. FINDINGS: Partially visualized enteric tube with tip likely in the distal stomach. No bowel dilatation. Air is noted in the colon. Degenerative changes of the spine. No acute osseous pathology. IMPRESSION: Enteric tube with tip likely in the distal stomach. Electronically Signed   By: Vanetta Chou M.D.   On: 02/29/2024 14:30   CT SOFT TISSUE NECK W CONTRAST Result Date: 02/28/2024 CLINICAL DATA:  Thyroid  cancer staging. Soft tissue infection of the neck is suspected. EXAM: CT NECK WITH CONTRAST TECHNIQUE: Multidetector CT imaging of the neck was performed using the standard protocol following the bolus administration of intravenous contrast. RADIATION DOSE REDUCTION: This exam was  performed according to the departmental dose-optimization program which includes automated exposure control, adjustment of the mA and/or kV according to patient size and/or use of iterative reconstruction technique. CONTRAST:  75mL OMNIPAQUE  IOHEXOL  350 MG/ML SOLN COMPARISON:  CT of the neck dated February 25, 2024. FINDINGS: A peripherally enhancing, centrally hypodense lesion is again demonstrated within the right thyroid  fossa measuring approximately 4.3 x 2.6 x 3.7 cm. There is an ovoid calculus present within the lesion, which measures approximately 13 x 9 x 14 mm. The collection abuts the right lateral surface of the trachea and causes left lateral displacement, as before. Pharynx and larynx: There has been interval improvement of retropharyngeal edema. There is extrinsic compression of the left  piriform sinus. The laryngeal soft tissues are unremarkable. Salivary glands: No inflammation, mass, or stone. Thyroid : Patent small nodules again demonstrated within the left lobe of the thyroid . The right thyroid  fossa is occupied by the peripherally enhancing lesion described above. Lymph nodes: There are mildly enlarged centrally hypodense right level 2 and 3 lymph nodes again demonstrated, which measure up to approximately 11 mm in short axis. There are no abnormally enlarged or morphologically suspicious lymph nodes present on the left. Vascular: Patent and grossly unremarkable. Limited intracranial: Negative. Visualized orbits: Status post bilateral lens replacement. Mastoids and visualized paranasal sinuses: Clear. 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 No osseous lesions are present. Upper chest: The lung apices are clear. Other: None. IMPRESSION: 1. Persistent irregular, peripherally enhancing, centrally hypodense lesion within the right thyroid  fossa, with associated mild right cervical lymphadenopathy. The lymph nodes are centrally hypodense and may represent  suppurative or necrotic nodes. The findings are concerning for metastatic thyroid  cancer, right thyroid  abscess with suppurative lymphadenopathy or a combination of the two. Electronically Signed   By: Evalene Coho M.D.   On: 02/28/2024 13:44   CT CHEST ABDOMEN PELVIS W CONTRAST Result Date: 02/28/2024 CLINICAL DATA:  Thyroid  cancer.  Metastatic disease evaluation. EXAM: CT CHEST, ABDOMEN, AND PELVIS WITH CONTRAST TECHNIQUE: Multidetector CT imaging of the chest, abdomen and pelvis was performed following the standard protocol during bolus administration of intravenous contrast. RADIATION DOSE REDUCTION: This exam was performed according to the departmental dose-optimization program which includes automated exposure control, adjustment of the mA and/or kV according to patient size and/or use of iterative reconstruction technique. CONTRAST:  75mL OMNIPAQUE  IOHEXOL  350 MG/ML SOLN COMPARISON:  Chest radiograph dated 1625.  Neck CT dated 02/25/2024. FINDINGS: CT CHEST FINDINGS Cardiovascular: No cardiomegaly or pericardial effusion. There is 2 vessel coronary vascular calcification. Advanced atherosclerotic calcification of the thoracic aorta. No aneurysmal dilatation or dissection. The origins of the great vessels of the aortic arch and the central pulmonary arteries are patent. Mediastinum/Nodes: No hilar or mediastinal adenopathy. Small hiatal hernia. The esophagus is grossly unremarkable. Partially visualized hypodense mass or collection in the right lateral neck. See neck CT report. Lungs/Pleura: No focal consolidation, pleural effusion, pneumothorax. The central airways are patent. Musculoskeletal: Osteopenia with degenerative changes of the spine. No acute osseous pathology. CT ABDOMEN PELVIS FINDINGS No intra-abdominal free air or free fluid. Hepatobiliary: Small liver cysts. No biliary dilatation. Multiple gallstones. No pericholecystic fluid or evidence of acute cholecystitis by CT. Pancreas:  Unremarkable. No pancreatic ductal dilatation or surrounding inflammatory changes. Spleen: Normal in size without focal abnormality. Adrenals/Urinary Tract: The adrenal glands unremarkable. There is no hydronephrosis on either side. There is symmetric enhancement and excretion of contrast by both kidneys. The visualized ureters and urinary bladder appear unremarkable. Stomach/Bowel: Small hiatal hernia. There is no bowel obstruction or active inflammation. The appendix is normal. Vascular/Lymphatic: Advanced aortoiliac atherosclerotic disease. The IVC is unremarkable. No portal venous gas. There is no adenopathy. Reproductive: The uterus is grossly unremarkable. No suspicious adnexal masses. Other: None Musculoskeletal: Osteopenia with degenerative changes of spine. No acute osseous pathology. IMPRESSION: 1. No acute intrathoracic, abdominal, or pelvic pathology. No metastatic disease in the chest, abdomen, or pelvis. 2. Partially visualized right neck mass/collection. See neck CT report. 3. Cholelithiasis. 4.  Aortic Atherosclerosis (ICD10-I70.0). Electronically Signed   By: Vanetta Chou M.D.   On: 02/28/2024 13:22   DG Chest Port 1 View Result Date: 02/28/2024 CLINICAL DATA:  Shortness of breath. EXAM: PORTABLE  CHEST 1 VIEW COMPARISON:  02/26/2024 FINDINGS: The lungs are clear without focal pneumonia, edema, pneumothorax or pleural effusion. Cardiopericardial silhouette is at upper limits of normal for size. No acute bony abnormality. Telemetry leads overlie the chest. IMPRESSION: No active disease. Electronically Signed   By: Camellia Candle M.D.   On: 02/28/2024 06:15   DG Chest Port 1 View Result Date: 02/26/2024 CLINICAL DATA:  [id number] SOB (shortness of breath) [id number] EXAM: PORTABLE CHEST - 1 VIEW COMPARISON:  07/04/2007. FINDINGS: Cardiac silhouette is unremarkable. No pneumothorax or pleural effusion. The lungs are clear. Aorta is calcified. The visualized skeletal structures are unremarkable.  IMPRESSION: No acute cardiopulmonary process. Electronically Signed   By: Fonda Field M.D.   On: 02/26/2024 06:37   CT Soft Tissue Neck W Contrast Result Date: 02/25/2024 CLINICAL DATA:  Thyroid  nodule, recent diagnosis of thyroid  cancer. Neck pain, throat clearing, concern for new mass or swelling. EXAM: CT NECK WITH CONTRAST TECHNIQUE: Multidetector CT imaging of the neck was performed using the standard protocol following the bolus administration of intravenous contrast. RADIATION DOSE REDUCTION: This exam was performed according to the departmental dose-optimization program which includes automated exposure control, adjustment of the mA and/or kV according to patient size and/or use of iterative reconstruction technique. CONTRAST:  75mL OMNIPAQUE  IOHEXOL  300 MG/ML  SOLN COMPARISON:  None Available. FINDINGS: There is a 4.1 x 2.5 x 3.9 cm heterogeneous centrally hypoattenuating mass centered in the right lower neck at the level of the thyroid  which demonstrates mild areas of peripherally enhancing soft tissue. There is a prominent calcification within the central/posterior aspect of the mass measuring up to 1.2 cm in diameter. There is associated mass effect with leftward deviation of the airway from the supraglottic airway to the extra thoracic trachea. The medial aspect of the mass abuts and possibly involves the right aspect of the thyroid  cartilage. There is retropharyngeal extension of the mass at the level of the cricoid cartilage. The mass abuts the right lateral aspect of the hypopharynx and cervical esophagus. There is retropharyngeal edema noted extending from C2 to the level of C5 measuring up to 6 mm in thickness. There is additional edema abutting the right carotid space without evidence of significant vascular narrowing. Additional masslike peripherally enhancing soft tissue involving the right sternocleidomastoid muscle observed on series 2 images 52-60. No retrosternal extension of the  mass. Pharynx and larynx: Large thyroid  mass as above with mass effect on the airway. The nasopharynx is symmetric. Symmetric appearance of the palatine tonsils. The oral cavity, floor of mouth, and base of tongue are unremarkable. Normal appearance of the epiglottis. Asymmetry of the piriform sinuses noted. The paraglottic fat appears unremarkable. There is asymmetry of the right laryngeal ventricle with slight medial positioning of the right vocal folds. Salivary glands: No inflammation, mass, or stone. Thyroid : Right thyroid  mass as above. Subcentimeter nodule in the inferior left thyroid  lobe. Lymph nodes: Right level 2A cervical node measuring 0.8 cm in short axis with possible central hypoattenuation concerning for central necrosis (series 2, image 42). Additional right level 2 B/3 cervical nodes measuring up to 1.0 cm in short axis. Vascular: Moderate atherosclerosis of the visualized aortic arch. Thyroid  mass abuts the right common carotid artery with lateral displacement of the vessel. There is edema surrounding the right common carotid artery with questionable wall thickening without evidence of vascular narrowing. Atherosclerosis at the carotid bifurcation likely with at least moderate stenosis. Limited intracranial: Limited visualization of intracranial structures without acute abnormality. Visualized orbits:  Bilateral lens replacement. Orbits otherwise unremarkable. Mastoids and visualized paranasal sinuses: Mild mucosal thickening in the ethmoid sinuses. Mastoid air cells are clear. Skeleton: No acute or aggressive process. Upper chest: Negative. Other: None. IMPRESSION: 4.1 x 2.5 x 3.9 cm heterogeneous mass in the right anterior neck at the level of the thyroid  concerning for residual/recurrent thyroid  malignancy. Associated mass effect and leftward shift of the airway without significant airway narrowing. Irregularity of the right aspect of the thyroid  cartilage adjacent to the mass concerning for  possible invasion. 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 Recommend correlation with direct visualization. Retropharyngeal extension of the mass which abuts the lower hypopharynx/cervical esophagus. Associated retropharyngeal edema. Edema noted along the right carotid space without significant vascular narrowing. Enlarged right level 2 and level 3 cervical nodes several of which demonstrate hypoattenuation concerning for metastatic lymphadenopathy with central necrosis. Lateral displacement of the right common carotid artery. Edema along the vessel with possible wall thickening which may reflect reactive inflammatory changes. No significant irregularity or narrowing to suggest vascular invasion. Electronically Signed   By: Donnice Mania M.D.   On: 02/25/2024 13:47    Microbiology: Results for orders placed or performed during the hospital encounter of 02/25/24  Culture, blood (Routine X 2) w Reflex to ID Panel     Status: None   Collection Time: 03/04/24  3:06 PM   Specimen: BLOOD RIGHT HAND  Result Value Ref Range Status   Specimen Description BLOOD RIGHT HAND  Final   Special Requests   Final    BOTTLES DRAWN AEROBIC ONLY Blood Culture results may not be optimal due to an inadequate volume of blood received in culture bottles   Culture   Final    NO GROWTH 5 DAYS Performed at [facility], [address], [place], [place] [address]    Report Status 03/09/2024 FINAL  Final  Culture, blood (Routine X 2) w Reflex to ID Panel     Status: None   Collection Time: 03/04/24  3:13 PM   Specimen: BLOOD LEFT ARM  Result Value Ref Range Status   Specimen Description BLOOD LEFT ARM  Final   Special Requests   Final    BOTTLES DRAWN AEROBIC AND ANAEROBIC Blood Culture adequate volume   Culture   Final    NO GROWTH 5 DAYS Performed at [facility], [address], [place], [place] [address]    Report Status 03/09/2024 FINAL  Final  Urine Culture (for pregnant, neutropenic or  urologic patients or patients with an indwelling urinary catheter)     Status: None   Collection Time: 03/05/24  8:59 PM   Specimen: Urine, Clean Catch  Result Value Ref Range Status   Specimen Description   Final    URINE, CLEAN CATCH Performed at [facility], [address], [place], [place] [address]    Special Requests   Final    NONE Performed at [facility], [address] [place], [place] [address]    Culture   Final    NO GROWTH Performed at [facility], [address] [place], [place] [address]    Report Status 03/07/2024 FINAL  Final    Labs: CBC: Recent Labs  Lab 03/07/24 0543  WBC 14.4*  HGB 9.4*  HCT 29.7*  MCV 88.4  PLT 399   Basic Metabolic Panel: Recent Labs  Lab 03/07/24 0543  NA 130*  K 4.9  CL 95*  CO2 27  GLUCOSE 297*  BUN 45*  CREATININE 0.88  CALCIUM  9.2  MG 2.2   Liver Function Tests: No results for input(s): AST, ALT, ALKPHOS, BILITOT, PROT, ALBUMIN in the last 168 hours. CBG: Recent Labs  Lab 03/12/24 2017 03/13/24 0003 03/13/24 0405 03/13/24 0737 03/13/24 1157  GLUCAP 186* 207* 152* 126* 209*    Discharge time spent: 41 minutes.  Signed: Concepcion Riser, MD [facility] 03/13/2024

## 2024-03-13 NOTE — Progress Notes (Signed)
 Patient being discharged to home with hospice. AVS reviewed with the patient and her husband and all questions answered. Patient's husband expressed feeling comfortable with administering medications and using home equipment including oxygen/trach.    Discharge medication provided by [facility].   Cortrak, rectal tube and IV's removed.  [facility] has been called for transportation.

## 2024-03-13 NOTE — Radiation Completion Notes (Signed)
 Patient Name: Nancy Sutton, Nancy Sutton MRN: [id number] Date of Birth: [birth year] Referring Physician: JERILYNN CARNES, M.D. Date of Service: 2024-03-13 Radiation Oncologist: Lauraine Golden, M.D. [facility]                             RADIATION ONCOLOGY END OF TREATMENT NOTE     Diagnosis: C73 Malignant neoplasm of thyroid  gland Intent: Palliative     ==========DELIVERED PLANS==========  First Treatment Date: 2024-03-06 Last Treatment Date: 2024-03-12   Plan Name: HN_Thyroid Site: Thyroid  Technique: IMRT Mode: Photon Dose Per Fraction: 4.4 Gy Prescribed Dose (Delivered / Prescribed): 22 Gy / 22 Gy Prescribed Fxs (Delivered / Prescribed): 5 / 5     ==========ON TREATMENT VISIT DATES========== 2024-03-09, 2024-03-12     ==========UPCOMING VISITS========== 05/29/2024 [facility] OFFICE VISIT Santo Stanly LABOR, MD  05/02/2024 [facility]-END[facility] OFFICE VISIT SPECIALTY Trixie File, MD        ==========APPENDIX - ON TREATMENT VISIT NOTES==========   See weekly On Treatment Notes in Epic for details in the Media tab (listed as Progress notes on the On Treatment Visit Dates listed above).

## 2024-03-13 NOTE — TOC Transition Note (Signed)
 Transition of Care [facility] - Discharge Note   Patient Details  Name: Nancy Sutton MRN: [id number] Date of Birth: 05-Feb-1946  Transition of Care [facility]) CM/SW Contact:  Toy LITTIE Agar, RN Phone Number:[phone]  03/13/2024, 3:39 PM   Clinical Narrative:    CM received call from Quante with [facility] to confirm that suction setup and all DME has been delivered and confirmed by daughter. Transportation has been arranged per [facility]. No other TOC needs noted.          Patient Goals and CMS Choice            Discharge Placement                       Discharge Plan and Services Additional resources added to the After Visit Summary for                                       Social Drivers of Health (SDOH) Interventions SDOH Screenings   Food Insecurity: No Food Insecurity (02/25/2024)  Housing: Low Risk  (02/25/2024)  Transportation Needs: No Transportation Needs (02/25/2024)  Utilities: Not At Risk (02/25/2024)  Social Connections: Unknown (02/25/2024)  Tobacco Use: Low Risk  (02/29/2024)  Recent Concern: Tobacco Use - Medium Risk (02/21/2024)   Received from [facility]     Readmission Risk Interventions     No data to display

## 2024-03-14 ENCOUNTER — Encounter (HOSPITAL_COMMUNITY): Payer: Self-pay | Admitting: Pathology

## 2024-03-14 NOTE — Telephone Encounter (Signed)
 Error

## 2024-03-16 ENCOUNTER — Telehealth: Payer: Self-pay

## 2024-03-16 NOTE — Telephone Encounter (Signed)
 Patient Assistance  Medication:Novolog  Dosage:U100 Quantity:4 Date received:03/16/24    *I called and spoke with the patient husband, he advised to me that Nancy Sutton is currently on hospice and is not doing well. She also had a stroke while in the hospital* They d/c all insulins  I have contacted [employer] to cancel all future shipments.

## 2024-03-19 NOTE — Telephone Encounter (Signed)
 Pt husband called this morning stating that she is supposed to be on her insulin  and I have reach out to [employer] to not d/c her insulin  shipments until further notice.

## 2024-03-22 NOTE — Telephone Encounter (Signed)
 Patient's spouse, Elisabel Hanover, came in to office today and picked up 4 boxes of patient assistance Novolog .

## 2024-04-26 ENCOUNTER — Telehealth: Payer: Self-pay

## 2024-04-26 NOTE — Telephone Encounter (Signed)
 Patient Assistance  Medication:Tresiba  Dosage:U200 Quantity:3

## 2024-04-26 NOTE — Telephone Encounter (Signed)
 Patient picked up patient assistance - Log Noted

## 2024-05-01 ENCOUNTER — Encounter (INDEPENDENT_AMBULATORY_CARE_PROVIDER_SITE_OTHER): Payer: Self-pay | Admitting: Gastroenterology

## 2024-05-02 ENCOUNTER — Ambulatory Visit: Admitting: Internal Medicine

## 2024-05-02 NOTE — Progress Notes (Signed)
 Oncology Nurse Navigator Documentation   Ms. Mccallum's son Charlie called asking about possible targeted therapies for her in the future if needed. I contacted Dr. Autumn who indicated that she does not qualify for any type of systemic therapies. Dr. Izell let me know that if she developed symptoms related to her thyroid  cancer that it may be possible to do a quad shot regimen but that it would come with risks related to reirradiation. She is also bedbound at home with hospice due to her history of tracheostomy, thyroid  cancer, and a stroke. This would make transportation very difficult for any type of treatment. I discussed all this with Charlie and he plans to talk to his mother within the next week to inform her of the options. He has my direct number to call if he has any further questions or concerns.  Delon Jefferson RN, BSN, OCN Head & Neck Oncology Nurse Navigator [facility] Phone # [phone]  Fax # [phone]

## 2024-05-15 ENCOUNTER — Other Ambulatory Visit (HOSPITAL_COMMUNITY): Payer: Self-pay

## 2024-05-29 ENCOUNTER — Ambulatory Visit: Admitting: Internal Medicine

## 2024-07-03 ENCOUNTER — Telehealth: Payer: Self-pay | Admitting: Internal Medicine

## 2024-07-03 NOTE — Telephone Encounter (Signed)
 Patient's spouse, Nanako Stopher, came in to office today and brought 2 forms to be completed for patient: 1)  [employer] Patient Garment/textile technologist with financial information 2)  [employer] Patient              Surveyor, quantity with               Financial information.  Both forms are in Dr. Ara folder in the front office.

## 2024-07-04 ENCOUNTER — Encounter (INDEPENDENT_AMBULATORY_CARE_PROVIDER_SITE_OTHER): Payer: Self-pay | Admitting: Gastroenterology

## 2024-07-04 NOTE — Telephone Encounter (Signed)
 Forms will be faxed back today, Per [employer] they could not be submitted yesterday (07/03/24) or it would be denied.

## 2024-07-11 ENCOUNTER — Other Ambulatory Visit: Payer: Self-pay

## 2024-07-11 MED ORDER — TRULICITY 1.5 MG/0.5ML ~~LOC~~ SOAJ: 1.5000 mg | SUBCUTANEOUS | 3 refills | Status: AC

## 2024-07-17 ENCOUNTER — Telehealth: Payer: Self-pay

## 2024-07-17 NOTE — Telephone Encounter (Signed)
 Patient husband called and left a VM stating that Nancy Sutton passed away on July 22, 2024.   He wanted me to call [employer] and [employer] to [place] the applications that were submitted for her.

## 2024-07-21 DEATH — deceased

## 2024-08-10 ENCOUNTER — Other Ambulatory Visit (HOSPITAL_COMMUNITY): Payer: Self-pay
